# Patient Record
Sex: Male | Born: 1970 | Race: White | Hispanic: No | Marital: Single | Smoking: Current every day smoker
Health system: Southern US, Community
[De-identification: ages and names within clinical notes are randomized; demographics above are authoritative.]

## PROBLEM LIST (undated history)

## (undated) DIAGNOSIS — F1010 Alcohol abuse, uncomplicated: Secondary | ICD-10-CM

## (undated) DIAGNOSIS — R569 Unspecified convulsions: Secondary | ICD-10-CM

## (undated) DIAGNOSIS — F329 Major depressive disorder, single episode, unspecified: Secondary | ICD-10-CM

## (undated) DIAGNOSIS — F32A Depression, unspecified: Secondary | ICD-10-CM

---

## 2010-09-16 ENCOUNTER — Emergency Department (HOSPITAL_COMMUNITY)
Admission: EM | Admit: 2010-09-16 | Discharge: 2010-09-17 | Disposition: A | Discharge status: Home / Self Care | Payer: Self-pay | Attending: Emergency Medicine | Admitting: Emergency Medicine

## 2010-09-16 DIAGNOSIS — F1010 Alcohol abuse, uncomplicated: Secondary | ICD-10-CM | POA: Insufficient documentation

## 2010-09-16 DIAGNOSIS — F3289 Other specified depressive episodes: Secondary | ICD-10-CM | POA: Insufficient documentation

## 2010-09-16 DIAGNOSIS — F329 Major depressive disorder, single episode, unspecified: Secondary | ICD-10-CM | POA: Insufficient documentation

## 2010-09-16 LAB — CBC
HCT: 45.1 % (ref 39.0–52.0)
Hemoglobin: 15.5 g/dL (ref 13.0–17.0)
MCH: 31.3 pg (ref 26.0–34.0)
MCV: 91.1 fL (ref 78.0–100.0)
Platelets: 336 10*3/uL (ref 150–400)
RBC: 4.95 MIL/uL (ref 4.22–5.81)
WBC: 11.4 10*3/uL — ABNORMAL HIGH (ref 4.0–10.5)

## 2010-09-16 LAB — COMPREHENSIVE METABOLIC PANEL
Albumin: 4.4 g/dL (ref 3.5–5.2)
Alkaline Phosphatase: 81 U/L (ref 39–117)
BUN: 8 mg/dL (ref 6–23)
CO2: 29 mEq/L (ref 19–32)
Chloride: 105 mEq/L (ref 96–112)
GFR calc non Af Amer: >60 mL/min (ref >60)
Potassium: 3.3 mEq/L — ABNORMAL LOW (ref 3.5–5.1)
Total Bilirubin: 0.2 mg/dL — ABNORMAL LOW (ref 0.3–1.2)

## 2010-09-16 LAB — DIFFERENTIAL
Eosinophils Absolute: 0.4 10*3/uL (ref 0.0–0.7)
Lymphocytes Relative: 21 % (ref 12–46)
Lymphs Abs: 2.4 10*3/uL (ref 0.7–4.0)
Monocytes Relative: 3 % (ref 3–12)
Neutro Abs: 8.2 10*3/uL — ABNORMAL HIGH (ref 1.7–7.7)
Neutrophils Relative %: 72 % (ref 43–77)

## 2010-09-16 LAB — RAPID URINE DRUG SCREEN, HOSP PERFORMED
Amphetamines: NOT DETECTED
Barbiturates: NOT DETECTED
Tetrahydrocannabinol: POSITIVE — AB

## 2010-12-15 ENCOUNTER — Emergency Department (HOSPITAL_COMMUNITY)
Admission: EM | Admit: 2010-12-15 | Discharge: 2010-12-15 | Discharge status: Against Medical Advice | Payer: Self-pay | Attending: Emergency Medicine | Admitting: Emergency Medicine

## 2010-12-15 DIAGNOSIS — S01501A Unspecified open wound of lip, initial encounter: Secondary | ICD-10-CM | POA: Insufficient documentation

## 2011-03-13 ENCOUNTER — Emergency Department (HOSPITAL_COMMUNITY)
Admission: EM | Admit: 2011-03-13 | Discharge: 2011-03-13 | Disposition: A | Discharge status: Home / Self Care | Payer: Self-pay | Attending: Emergency Medicine | Admitting: Emergency Medicine

## 2011-03-13 ENCOUNTER — Encounter: Payer: Self-pay | Admitting: Emergency Medicine

## 2011-03-13 DIAGNOSIS — F10929 Alcohol use, unspecified with intoxication, unspecified: Secondary | ICD-10-CM

## 2011-03-13 DIAGNOSIS — W1809XA Striking against other object with subsequent fall, initial encounter: Secondary | ICD-10-CM | POA: Insufficient documentation

## 2011-03-13 DIAGNOSIS — R269 Unspecified abnormalities of gait and mobility: Secondary | ICD-10-CM | POA: Insufficient documentation

## 2011-03-13 DIAGNOSIS — S0100XA Unspecified open wound of scalp, initial encounter: Secondary | ICD-10-CM | POA: Insufficient documentation

## 2011-03-13 DIAGNOSIS — R4189 Other symptoms and signs involving cognitive functions and awareness: Secondary | ICD-10-CM | POA: Insufficient documentation

## 2011-03-13 DIAGNOSIS — F1010 Alcohol abuse, uncomplicated: Secondary | ICD-10-CM | POA: Insufficient documentation

## 2011-03-13 DIAGNOSIS — S0191XA Laceration without foreign body of unspecified part of head, initial encounter: Secondary | ICD-10-CM

## 2011-03-13 HISTORY — DX: Alcohol abuse, uncomplicated: F10.10

## 2011-03-13 MED ORDER — TETANUS-DIPHTH-ACELL PERTUSSIS 5-2.5-18.5 LF-MCG/0.5 IM SUSP
0.5000 mL | Freq: Once | INTRAMUSCULAR | Status: AC
  Administered 2011-03-13: 0.5 mL via INTRAMUSCULAR
  Filled 2011-03-13 (×2): qty 0.5

## 2011-03-13 NOTE — ED Notes (Signed)
RUE:AVWU<JW> Expected date:03/13/11<BR> Expected time: 7:05 PM<BR> Means of arrival:Ambulance<BR> Comments:<BR> EMS 30 GC - etoh/fall
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
## 2011-03-13 NOTE — ED Notes (Signed)
MD at bedside. 

## 2011-10-22 ENCOUNTER — Emergency Department (HOSPITAL_COMMUNITY): Payer: Self-pay

## 2011-10-22 ENCOUNTER — Emergency Department (HOSPITAL_COMMUNITY)
Admission: EM | Admit: 2011-10-22 | Discharge: 2011-10-23 | Disposition: A | Discharge status: Home / Self Care | Payer: Self-pay | Attending: Emergency Medicine | Admitting: Emergency Medicine

## 2011-10-22 ENCOUNTER — Encounter (HOSPITAL_COMMUNITY): Payer: Self-pay | Admitting: Emergency Medicine

## 2011-10-22 DIAGNOSIS — F10929 Alcohol use, unspecified with intoxication, unspecified: Secondary | ICD-10-CM

## 2011-10-22 DIAGNOSIS — S0990XA Unspecified injury of head, initial encounter: Secondary | ICD-10-CM | POA: Insufficient documentation

## 2011-10-22 DIAGNOSIS — S0100XA Unspecified open wound of scalp, initial encounter: Secondary | ICD-10-CM | POA: Insufficient documentation

## 2011-10-22 DIAGNOSIS — IMO0002 Reserved for concepts with insufficient information to code with codable children: Secondary | ICD-10-CM | POA: Insufficient documentation

## 2011-10-22 DIAGNOSIS — F1010 Alcohol abuse, uncomplicated: Secondary | ICD-10-CM | POA: Insufficient documentation

## 2011-10-22 DIAGNOSIS — X58XXXA Exposure to other specified factors, initial encounter: Secondary | ICD-10-CM | POA: Insufficient documentation

## 2011-10-22 DIAGNOSIS — S0101XA Laceration without foreign body of scalp, initial encounter: Secondary | ICD-10-CM

## 2011-10-22 LAB — COMPREHENSIVE METABOLIC PANEL
ALT: 21 U/L (ref 0–53)
Alkaline Phosphatase: 60 U/L (ref 39–117)
CO2: 24 mEq/L (ref 19–32)
GFR calc Af Amer: >90 mL/min (ref >90)
GFR calc non Af Amer: >90 mL/min (ref >90)
Glucose, Bld: 144 mg/dL — ABNORMAL HIGH (ref 70–99)
Potassium: 3.2 mEq/L — ABNORMAL LOW (ref 3.5–5.1)
Sodium: 141 mEq/L (ref 135–145)
Total Bilirubin: 0.2 mg/dL — ABNORMAL LOW (ref 0.3–1.2)

## 2011-10-22 LAB — CBC
Hemoglobin: 14.3 g/dL (ref 13.0–17.0)
MCV: 91.9 fL (ref 78.0–100.0)
Platelets: 290 10*3/uL (ref 150–400)
RBC: 4.46 MIL/uL (ref 4.22–5.81)
WBC: 8.4 10*3/uL (ref 4.0–10.5)

## 2011-10-22 LAB — DIFFERENTIAL
Lymphocytes Relative: 29 % (ref 12–46)
Lymphs Abs: 2.5 10*3/uL (ref 0.7–4.0)
Neutrophils Relative %: 60 % (ref 43–77)

## 2011-10-22 LAB — RAPID URINE DRUG SCREEN, HOSP PERFORMED
Barbiturates: NOT DETECTED
Cocaine: NOT DETECTED
Tetrahydrocannabinol: NOT DETECTED

## 2011-10-22 LAB — URINALYSIS, ROUTINE W REFLEX MICROSCOPIC
Bilirubin Urine: NEGATIVE
Ketones, ur: NEGATIVE mg/dL
Nitrite: NEGATIVE
Protein, ur: NEGATIVE mg/dL
Urobilinogen, UA: 0.2 mg/dL (ref 0.0–1.0)
pH: 5 (ref 5.0–8.0)

## 2011-10-22 MED ORDER — ZIPRASIDONE MESYLATE 20 MG IM SOLR
INTRAMUSCULAR | Status: AC
  Administered 2011-10-22: 20 mg via INTRAMUSCULAR
  Filled 2011-10-22: qty 20

## 2011-10-22 MED ORDER — ZIPRASIDONE MESYLATE 20 MG IM SOLR
20.0000 mg | Freq: Once | INTRAMUSCULAR | Status: AC
  Administered 2011-10-22: 20 mg via INTRAMUSCULAR
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
## 2011-10-22 NOTE — ED Notes (Signed)
[id number]<AV> Expected date:10/22/11<BR> Expected time: 3:10 PM<BR> Means of arrival:<BR> Comments:<BR> etoh

## 2011-10-22 NOTE — Discharge Instructions (Signed)
Finding Treatment for Alcohol and Drug Addiction   It can be hard to find the right place to get professional treatment. Here are some important things to consider:   There are different types of treatment to choose from.   Some programs are live-in (residential) while others are not (outpatient). Sometimes a combination is offered.   No single type of program is right for everyone.   Most treatment programs involve a combination of education, counseling, and a 12-step, spiritually-based approach.   There are non-spiritually based programs (not 12-step).   Some treatment programs are government sponsored. They are geared for patients without private insurance.   Treatment programs can vary in many respects such as:   Cost and types of insurance accepted.   Types of on-site medical services offered.   Length of stay, setting, and size.   Overall philosophy of treatment.   A person may need specialized treatment or have needs not addressed by all programs. For example, adolescents need treatment appropriate for their age. Other people have secondary disorders that must be managed as well. Secondary conditions can include mental illness, such as depression or diabetes. Often, a period of detoxification from alcohol or drugs is needed. This requires medical supervision and not all programs offer this.   THINGS TO CONSIDER WHEN SELECTING A TREATMENT PROGRAM   Is the program certified by the appropriate government agency? Even private programs must be certified and employ certified professionals.   Does the program accept your insurance? If not, can a payment plan be set up?   Is the facility clean, organized, and well run? Do they allow you to speak with graduates who can share their treatment experience with you? Can you tour the facility? Can you meet with staff?   Does the program meet the full range of individual needs?   Does the treatment program address sexual orientation and physical disabilities? Do they provide  age, gender, and culturally appropriate treatment services?   Is treatment available in languages other than English?   Is long-term aftercare support or guidance encouraged and provided?   Is assessment of an individual's treatment plan ongoing to ensure it meets changing needs?   Does the program use strategies to encourage reluctant patients to remain in treatment long enough to increase the likelihood of success?   Does the program offer counseling (individual or group) and other behavioral therapies?   Does the program offer medicine as part of the treatment regimen, if needed?   Is there ongoing monitoring of possible relapse? Is there a defined relapse prevention program? Are services or referrals offered to family members to ensure they understand addiction and the recovery process? This would help them support the recovering individual.   Are 12-step meetings held at the center or is transport available for patients to attend outside meetings?  In countries outside of the [place], see local directories for contact information for services in your area.   Document Released: 03/20/2005 Document Revised: 04/10/2011 Document Reviewed: 09/30/2007   [employer]® Patient Information ©2012 [employer].

## 2011-10-23 NOTE — ED Notes (Signed)
Pt left prior to receiving d/c paper work - unable to obtain d/c VS or signature.

## 2011-10-23 NOTE — ED Notes (Signed)
Pt awake, alert and active - requesting to be d/c'd - Dr. Lynelle Doctor notified.

## 2011-10-23 NOTE — ED Notes (Signed)
Pt asked to leave hospital and stated that he feels.  Pt stated that he would leave without consent if he no one released him.  Nurse was informed.

## 2012-01-16 ENCOUNTER — Encounter (HOSPITAL_COMMUNITY): Payer: Self-pay | Admitting: Nurse Practitioner

## 2012-01-16 ENCOUNTER — Emergency Department (HOSPITAL_COMMUNITY)
Admission: EM | Admit: 2012-01-16 | Discharge: 2012-01-16 | Discharge status: Against Medical Advice | Payer: Self-pay | Attending: Emergency Medicine | Admitting: Emergency Medicine

## 2012-01-16 DIAGNOSIS — S51812A Laceration without foreign body of left forearm, initial encounter: Secondary | ICD-10-CM

## 2012-01-16 DIAGNOSIS — S51809A Unspecified open wound of unspecified forearm, initial encounter: Secondary | ICD-10-CM | POA: Insufficient documentation

## 2012-01-16 DIAGNOSIS — F1010 Alcohol abuse, uncomplicated: Secondary | ICD-10-CM | POA: Insufficient documentation

## 2012-01-16 MED ORDER — TETANUS-DIPHTH-ACELL PERTUSSIS 5-2.5-18.5 LF-MCG/0.5 IM SUSP: 0.5000 mL | Freq: Once | INTRAMUSCULAR | Status: DC

## 2012-01-16 NOTE — ED Notes (Signed)
Pt came to nurses station and states he does not want to stay unless he can smoke a cigarette. Discussed plan of care with pt and encouraged pt to stay for treatment, educated pt about risk of infection at site of wound. Pt states he is not staying and wants to sign out AMA.
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
## 2012-01-17 NOTE — ED Provider Notes (Signed)
Medical screening examination/treatment/procedure(s) were performed by non-physician practitioner and as supervising physician I was immediately available for consultation/collaboration.  Annaleise Burger, MD 01/17/12 0042 

## 2012-08-21 ENCOUNTER — Encounter (HOSPITAL_COMMUNITY): Payer: Self-pay | Admitting: Physical Medicine and Rehabilitation

## 2012-08-21 ENCOUNTER — Emergency Department (HOSPITAL_COMMUNITY)
Admission: EM | Admit: 2012-08-21 | Discharge: 2012-08-21 | Discharge status: Against Medical Advice | Payer: Self-pay | Attending: Emergency Medicine | Admitting: Emergency Medicine

## 2012-08-21 DIAGNOSIS — K089 Disorder of teeth and supporting structures, unspecified: Secondary | ICD-10-CM | POA: Insufficient documentation

## 2012-08-21 NOTE — ED Notes (Signed)
Called for pt no answer x3 

## 2012-08-21 NOTE — ED Notes (Signed)
Unable to locate pt x2

## 2012-08-21 NOTE — ED Notes (Signed)
Pt called x1. Unable to locate. 

## 2012-08-21 NOTE — ED Notes (Signed)
Called for pt with no answer 

## 2012-08-21 NOTE — ED Notes (Signed)
Pt presents to department for evaluation of L upper molar pain. Ongoing for several days. 8/10 pain at the time. No signs of acute distress noted.

## 2012-11-30 ENCOUNTER — Encounter (HOSPITAL_COMMUNITY): Payer: Self-pay | Admitting: Emergency Medicine

## 2012-11-30 ENCOUNTER — Emergency Department (HOSPITAL_COMMUNITY): Payer: Self-pay

## 2012-11-30 ENCOUNTER — Emergency Department (HOSPITAL_COMMUNITY)
Admission: EM | Admit: 2012-11-30 | Discharge: 2012-11-30 | Disposition: A | Discharge status: Home / Self Care | Payer: Self-pay | Attending: Emergency Medicine | Admitting: Emergency Medicine

## 2012-11-30 DIAGNOSIS — S62501A Fracture of unspecified phalanx of right thumb, initial encounter for closed fracture: Secondary | ICD-10-CM

## 2012-11-30 DIAGNOSIS — S62609A Fracture of unspecified phalanx of unspecified finger, initial encounter for closed fracture: Secondary | ICD-10-CM | POA: Insufficient documentation

## 2012-11-30 DIAGNOSIS — Y929 Unspecified place or not applicable: Secondary | ICD-10-CM | POA: Insufficient documentation

## 2012-11-30 DIAGNOSIS — Y9389 Activity, other specified: Secondary | ICD-10-CM | POA: Insufficient documentation

## 2012-11-30 DIAGNOSIS — M25549 Pain in joints of unspecified hand: Secondary | ICD-10-CM | POA: Insufficient documentation

## 2012-11-30 DIAGNOSIS — R296 Repeated falls: Secondary | ICD-10-CM | POA: Insufficient documentation

## 2012-11-30 DIAGNOSIS — F17200 Nicotine dependence, unspecified, uncomplicated: Secondary | ICD-10-CM | POA: Insufficient documentation

## 2012-11-30 DIAGNOSIS — F1010 Alcohol abuse, uncomplicated: Secondary | ICD-10-CM | POA: Insufficient documentation

## 2012-11-30 MED ORDER — HYDROCODONE-ACETAMINOPHEN 5-325 MG PO TABS: 1.0000 | ORAL_TABLET | Freq: Three times a day (TID) | ORAL | Status: DC | PRN

## 2012-11-30 MED ORDER — IBUPROFEN 400 MG PO TABS
800.0000 mg | ORAL_TABLET | Freq: Once | ORAL | Status: AC
  Administered 2012-11-30: 800 mg via ORAL
  Filled 2012-11-30: qty 2

## 2012-11-30 NOTE — ED Notes (Signed)
Pt c/o right thumb pain x 1 month since falling
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
## 2012-12-01 NOTE — ED Provider Notes (Signed)
Medical screening examination/treatment/procedure(s) were performed by non-physician practitioner and as supervising physician I was immediately available for consultation/collaboration.  Doug Sou, MD 12/01/12 1023

## 2012-12-10 ENCOUNTER — Emergency Department (HOSPITAL_COMMUNITY)
Admission: EM | Admit: 2012-12-10 | Discharge: 2012-12-10 | Disposition: A | Discharge status: Home / Self Care | Payer: Self-pay | Attending: Emergency Medicine | Admitting: Emergency Medicine

## 2012-12-10 ENCOUNTER — Encounter (HOSPITAL_COMMUNITY): Payer: Self-pay | Admitting: Emergency Medicine

## 2012-12-10 ENCOUNTER — Emergency Department (HOSPITAL_COMMUNITY): Payer: Self-pay

## 2012-12-10 DIAGNOSIS — S0003XA Contusion of scalp, initial encounter: Secondary | ICD-10-CM | POA: Insufficient documentation

## 2012-12-10 DIAGNOSIS — R404 Transient alteration of awareness: Secondary | ICD-10-CM | POA: Insufficient documentation

## 2012-12-10 DIAGNOSIS — S0083XA Contusion of other part of head, initial encounter: Secondary | ICD-10-CM

## 2012-12-10 DIAGNOSIS — F17200 Nicotine dependence, unspecified, uncomplicated: Secondary | ICD-10-CM | POA: Insufficient documentation

## 2012-12-10 DIAGNOSIS — S1093XA Contusion of unspecified part of neck, initial encounter: Secondary | ICD-10-CM | POA: Insufficient documentation

## 2012-12-10 MED ORDER — HYDROCODONE-ACETAMINOPHEN 5-325 MG PO TABS: 1.0000 | ORAL_TABLET | Freq: Four times a day (QID) | ORAL | Status: DC | PRN
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
## 2012-12-12 NOTE — ED Provider Notes (Signed)
Medical screening examination/treatment/procedure(s) were conducted as a shared visit with non-physician practitioner(s) or resident  and myself.  I personally evaluated the patient during the encounter and agree with the findings and plan unless otherwise indicated.  Facial contusion.  Mild tenderness with no step off. CNs intact.  CT scan reviewed.  Close fup discussed.    Enid Skeens, MD 12/12/12 [phone]

## 2013-01-07 ENCOUNTER — Encounter (HOSPITAL_COMMUNITY): Payer: Self-pay | Admitting: Emergency Medicine

## 2013-01-07 ENCOUNTER — Emergency Department (HOSPITAL_COMMUNITY)
Admission: EM | Admit: 2013-01-07 | Discharge: 2013-01-07 | Disposition: A | Discharge status: Home / Self Care | Payer: Self-pay | Attending: Emergency Medicine | Admitting: Emergency Medicine

## 2013-01-07 DIAGNOSIS — F17200 Nicotine dependence, unspecified, uncomplicated: Secondary | ICD-10-CM | POA: Insufficient documentation

## 2013-01-07 DIAGNOSIS — L0201 Cutaneous abscess of face: Secondary | ICD-10-CM | POA: Insufficient documentation

## 2013-01-07 DIAGNOSIS — L03211 Cellulitis of face: Secondary | ICD-10-CM | POA: Insufficient documentation

## 2013-01-07 DIAGNOSIS — F1021 Alcohol dependence, in remission: Secondary | ICD-10-CM | POA: Insufficient documentation

## 2013-01-07 DIAGNOSIS — L255 Unspecified contact dermatitis due to plants, except food: Secondary | ICD-10-CM | POA: Insufficient documentation

## 2013-01-07 DIAGNOSIS — L237 Allergic contact dermatitis due to plants, except food: Secondary | ICD-10-CM

## 2013-01-07 DIAGNOSIS — Z8659 Personal history of other mental and behavioral disorders: Secondary | ICD-10-CM | POA: Insufficient documentation

## 2013-01-07 HISTORY — DX: Depression, unspecified: F32.A

## 2013-01-07 HISTORY — DX: Major depressive disorder, single episode, unspecified: F32.9

## 2013-01-07 MED ORDER — CEPHALEXIN 500 MG PO CAPS: 500.0000 mg | ORAL_CAPSULE | Freq: Four times a day (QID) | ORAL | Status: DC

## 2013-01-07 MED ORDER — PREDNISONE 20 MG PO TABS: 60.0000 mg | ORAL_TABLET | Freq: Every day | ORAL | Status: AC

## 2013-01-07 NOTE — ED Notes (Signed)
Pt here for "poison ivy" all over his body. Right arm swollen. ALSO, has "cyst" left face, tender, dimpling to left face.

## 2013-01-07 NOTE — ED Provider Notes (Signed)
CSN: [id number]     Arrival date & time 01/07/13  4098 History   First MD Initiated Contact with Patient 01/07/13 0940     Chief Complaint  Patient presents with  . Rash   (Consider location/radiation/quality/duration/timing/severity/associated sxs/prior Treatment) Patient is a 42 y.o. male presenting with rash and abscess. The history is provided by the patient. No language interpreter was used.  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 Abscess Location:  Face Associated symptoms: no fever   Associated symptoms comment:  Painful swollen nodule to left cheek since previous treatment for dental infection.   Past Medical History  Diagnosis Date  . ETOH abuse   . Depression    History reviewed. No pertinent past surgical history. No family history on file. History  Substance Use Topics  . Smoking status: Current Every Day Smoker    Types: Cigarettes  . Smokeless tobacco: Not on file  . Alcohol Use: Yes     Comment: social    Review of Systems  Constitutional: Negative for fever.  HENT: Negative for dental problem.   Skin: Positive for rash.       See HPI.    Allergies  Review of patient's allergies indicates no known allergies.  Home Medications  No current outpatient prescriptions on file. BP 136/87  Pulse 102  Temp(Src) 98.4 F (36.9 C)  Resp 16  SpO2 97% Physical Exam  Constitutional: He appears well-developed and well-nourished. No distress.  Lymphadenopathy:    He has no cervical adenopathy.  Skin:  Weeping, maculopapular rash to arms, neck, face (including right periorbital area) c/w contact dermatitis. There is a small, non-fluctuant nodule that is minimally tender to left cheek.    ED Course  Procedures (including critical care time) Labs Review Labs Reviewed - No data to  display Imaging Review No results found.  MDM  No diagnosis found. 1. Poison Ivy 2. Facial abscess  Tender facial nodule without fluctuance - DDx: early abscess vs nonspecific, non-infectious nodule. Will cover with abx. Prednisone for contact dermatitis.    Arnoldo Hooker, PA-C 01/10/13 [phone]
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
## 2013-01-10 NOTE — ED Provider Notes (Signed)
Medical screening examination/treatment/procedure(s) were performed by non-physician practitioner and as supervising physician I was immediately available for consultation/collaboration.   Dagmar Hait, MD 01/10/13 [phone]

## 2013-01-14 ENCOUNTER — Encounter (HOSPITAL_COMMUNITY): Payer: Self-pay

## 2013-01-14 ENCOUNTER — Emergency Department (HOSPITAL_COMMUNITY)
Admission: EM | Admit: 2013-01-14 | Discharge: 2013-01-14 | Disposition: A | Discharge status: Home / Self Care | Payer: Self-pay | Attending: Emergency Medicine | Admitting: Emergency Medicine

## 2013-01-14 ENCOUNTER — Emergency Department (HOSPITAL_COMMUNITY): Payer: Self-pay

## 2013-01-14 DIAGNOSIS — S0990XA Unspecified injury of head, initial encounter: Secondary | ICD-10-CM | POA: Insufficient documentation

## 2013-01-14 DIAGNOSIS — S0100XA Unspecified open wound of scalp, initial encounter: Secondary | ICD-10-CM | POA: Insufficient documentation

## 2013-01-14 DIAGNOSIS — F17200 Nicotine dependence, unspecified, uncomplicated: Secondary | ICD-10-CM | POA: Insufficient documentation

## 2013-01-14 DIAGNOSIS — Y939 Activity, unspecified: Secondary | ICD-10-CM | POA: Insufficient documentation

## 2013-01-14 DIAGNOSIS — T148XXA Other injury of unspecified body region, initial encounter: Secondary | ICD-10-CM

## 2013-01-14 DIAGNOSIS — F1010 Alcohol abuse, uncomplicated: Secondary | ICD-10-CM | POA: Insufficient documentation

## 2013-01-14 DIAGNOSIS — Y9289 Other specified places as the place of occurrence of the external cause: Secondary | ICD-10-CM | POA: Insufficient documentation

## 2013-01-14 DIAGNOSIS — T798XXS Other early complications of trauma, sequela: Secondary | ICD-10-CM

## 2013-01-14 DIAGNOSIS — X58XXXA Exposure to other specified factors, initial encounter: Secondary | ICD-10-CM | POA: Insufficient documentation

## 2013-01-14 DIAGNOSIS — Z23 Encounter for immunization: Secondary | ICD-10-CM | POA: Insufficient documentation

## 2013-01-14 DIAGNOSIS — Z8659 Personal history of other mental and behavioral disorders: Secondary | ICD-10-CM | POA: Insufficient documentation

## 2013-01-14 MED ORDER — TETANUS-DIPHTH-ACELL PERTUSSIS 5-2.5-18.5 LF-MCG/0.5 IM SUSP
0.5000 mL | Freq: Once | INTRAMUSCULAR | Status: AC
  Administered 2013-01-14: 0.5 mL via INTRAMUSCULAR
  Filled 2013-01-14: qty 0.5

## 2013-01-14 MED ORDER — CEPHALEXIN 500 MG PO CAPS: 500.0000 mg | ORAL_CAPSULE | Freq: Four times a day (QID) | ORAL | Status: DC
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
## 2013-01-14 NOTE — ED Notes (Signed)
Patient transported to CT 

## 2013-01-14 NOTE — ED Notes (Signed)
Patient has taken off c-collar and ambulatory out of bed. MOEx4 with steady gait. Requesting to use the restroom. Provided patient a urinal.

## 2013-01-14 NOTE — ED Notes (Signed)
[employer] remains at bedside. 

## 2013-05-28 DIAGNOSIS — F17200 Nicotine dependence, unspecified, uncomplicated: Secondary | ICD-10-CM | POA: Insufficient documentation

## 2013-05-28 DIAGNOSIS — F10229 Alcohol dependence with intoxication, unspecified: Secondary | ICD-10-CM | POA: Insufficient documentation

## 2013-05-28 DIAGNOSIS — Z8659 Personal history of other mental and behavioral disorders: Secondary | ICD-10-CM | POA: Insufficient documentation

## 2013-05-29 ENCOUNTER — Emergency Department (HOSPITAL_COMMUNITY)
Admission: EM | Admit: 2013-05-29 | Discharge: 2013-05-29 | Disposition: A | Discharge status: Home / Self Care | Payer: Self-pay | Attending: Emergency Medicine | Admitting: Emergency Medicine

## 2013-05-29 ENCOUNTER — Encounter (HOSPITAL_COMMUNITY): Payer: Self-pay | Admitting: Emergency Medicine

## 2013-05-29 DIAGNOSIS — F10929 Alcohol use, unspecified with intoxication, unspecified: Secondary | ICD-10-CM

## 2013-05-29 LAB — CBC WITH DIFFERENTIAL/PLATELET
BASOS PCT: 1 % (ref 0–1)
Basophils Absolute: 0.1 10*3/uL (ref 0.0–0.1)
Eosinophils Absolute: 0.4 10*3/uL (ref 0.0–0.7)
Eosinophils Relative: 4 % (ref 0–5)
HEMATOCRIT: 40.7 % (ref 39.0–52.0)
HEMOGLOBIN: 14.2 g/dL (ref 13.0–17.0)
LYMPHS ABS: 2.4 10*3/uL (ref 0.7–4.0)
Lymphocytes Relative: 24 % (ref 12–46)
MCH: 34.1 pg — AB (ref 26.0–34.0)
MCHC: 34.9 g/dL (ref 30.0–36.0)
MCV: 97.6 fL (ref 78.0–100.0)
MONO ABS: 0.4 10*3/uL (ref 0.1–1.0)
MONOS PCT: 4 % (ref 3–12)
NEUTROS ABS: 6.5 10*3/uL (ref 1.7–7.7)
NEUTROS PCT: 66 % (ref 43–77)
Platelets: 336 10*3/uL (ref 150–400)
RBC: 4.17 MIL/uL — AB (ref 4.22–5.81)
RDW: 11.7 % (ref 11.5–15.5)
WBC: 9.8 10*3/uL (ref 4.0–10.5)

## 2013-05-29 LAB — ETHANOL: Alcohol, Ethyl (B): 387 mg/dL — ABNORMAL HIGH (ref 0–11)

## 2013-05-29 LAB — BASIC METABOLIC PANEL
BUN: 16 mg/dL (ref 6–23)
CO2: 23 meq/L (ref 19–32)
Calcium: 9.3 mg/dL (ref 8.4–10.5)
Chloride: 106 mEq/L (ref 96–112)
Creatinine, Ser: 0.9 mg/dL (ref 0.50–1.35)
GFR calc Af Amer: >90 mL/min (ref >90)
GFR calc non Af Amer: >90 mL/min (ref >90)
GLUCOSE: 98 mg/dL (ref 70–99)
POTASSIUM: 4.3 meq/L (ref 3.7–5.3)
SODIUM: 145 meq/L (ref 137–147)

## 2013-05-29 NOTE — ED Notes (Signed)
Pt left after receiving discharge instructions from MD, but prior to receiving written instructions and signing for discharge

## 2013-05-29 NOTE — Discharge Instructions (Signed)
Alcohol Intoxication °Alcohol intoxication occurs when the amount of alcohol that a person has consumed impairs his or her ability to mentally and physically function. Alcohol directly impairs the normal chemical activity of the brain. Drinking large amounts of alcohol can lead to changes in mental function and behavior, and it can cause many physical effects that can be harmful.  °Alcohol intoxication can range in severity from mild to very severe. Various factors can affect the level of intoxication that occurs, such as the person's age, gender, weight, frequency of alcohol consumption, and the presence of other medical conditions (such as diabetes, seizures, or heart conditions). Dangerous levels of alcohol intoxication may occur when people drink large amounts of alcohol in a short period (binge drinking). Alcohol can also be especially dangerous when combined with certain prescription medicines or "recreational" drugs. °SIGNS AND SYMPTOMS °Some common signs and symptoms of mild alcohol intoxication include: °· Loss of coordination. °· Changes in mood and behavior. °· Impaired judgment. °· Slurred speech. °As alcohol intoxication progresses to more severe levels, other signs and symptoms will appear. These may include: °· Vomiting. °· Confusion and impaired memory. °· Slowed breathing. °· Seizures. °· Loss of consciousness. °DIAGNOSIS  °Your health care provider will take a medical history and perform a physical exam. You will be asked about the amount and type of alcohol you have consumed. Blood tests will be done to measure the concentration of alcohol in your blood. In many places, your blood alcohol level must be lower than 80 mg/dL (0.08%) to legally drive. However, many dangerous effects of alcohol can occur at much lower levels.  °TREATMENT  °People with alcohol intoxication often do not require treatment. Most of the effects of alcohol intoxication are temporary, and they go away as the alcohol naturally  leaves the body. Your health care provider will monitor your condition until you are stable enough to go home. Fluids are sometimes given through an IV access tube to help prevent dehydration.  °HOME CARE INSTRUCTIONS °· Do not drive after drinking alcohol. °· Stay hydrated. Drink enough water and fluids to keep your urine clear or pale yellow. Avoid caffeine.   °· Only take over-the-counter or prescription medicines as directed by your health care provider.   °SEEK MEDICAL CARE IF:  °· You have persistent vomiting.   °· You do not feel better after a few days. °· You have frequent alcohol intoxication. Your health care provider can help determine if you should see a substance use treatment counselor. °SEEK IMMEDIATE MEDICAL CARE IF:  °· You become shaky or tremble when you try to stop drinking.   °· You shake uncontrollably (seizure).   °· You throw up (vomit) blood. This may be bright red or may look like black coffee grounds.   °· You have blood in your stool. This may be bright red or may appear as a black, tarry, bad smelling stool.   °· You become lightheaded or faint.   °MAKE SURE YOU:  °· Understand these instructions. °· Will watch your condition. °· Will get help right away if you are not doing well or get worse. °Document Released: 01/29/2005 Document Revised: 12/22/2012 Document Reviewed: 09/24/2012 °[employer]® Patient Information ©2014 [employer]. ° °

## 2013-05-29 NOTE — ED Provider Notes (Signed)
CSN: [id number]     Arrival date & time 05/28/13  2359 History   First MD Initiated Contact with Patient 05/29/13 0044     Chief Complaint  Patient presents with  . Alcohol Intoxication   (Consider location/radiation/quality/duration/timing/severity/associated sxs/prior Treatment) HPI Comments: Patient is a 43 year old male with history of chronic alcoholism. [place] [facility] [place]. He was apparently admitted to the drunk tank, but was too intoxicated and the staff there felt uncomfortable with this presence. He was then brought here. The patient has no complaints with the exception of being hungry.  Patient is a 43 y.o. male presenting with intoxication. The history is provided by the patient.  Alcohol Intoxication This is a recurrent problem. The problem occurs constantly. The problem has not changed since onset.Nothing aggravates the symptoms. Nothing relieves the symptoms.    Past Medical History  Diagnosis Date  . ETOH abuse   . Depression    History reviewed. No pertinent past surgical history. No family history on file. History  Substance Use Topics  . Smoking status: Current Every Day Smoker    Types: Cigarettes  . Smokeless tobacco: Never Used  . Alcohol Use: Yes     Comment: social    Review of Systems  All other systems reviewed and are negative.    Allergies  Review of patient's allergies indicates no known allergies.  Home Medications  No current outpatient prescriptions on file. BP 108/69  Pulse 83  Temp(Src) 97.1 F (36.2 C) (Oral)  Resp 18  SpO2 98% Physical Exam  Nursing note and vitals reviewed. Constitutional: He is oriented to person, place, and time. He appears well-developed and well-nourished. No distress.  The patient is somnolent, but arousable. There is a strong odor of alcohol present.  HENT:  Head: Normocephalic and atraumatic.  Mouth/Throat: Oropharynx is clear and moist.  Eyes: EOM are normal.  Pupils are equal, round, and reactive to light.  Neck: Normal range of motion. Neck supple.  Cardiovascular: Normal rate, regular rhythm and normal heart sounds.   No murmur heard. Pulmonary/Chest: Effort normal and breath sounds normal. No respiratory distress. He has no wheezes.  Abdominal: Soft. Bowel sounds are normal. He exhibits no distension. There is no tenderness.  Musculoskeletal: Normal range of motion. He exhibits no edema.  Lymphadenopathy:    He has no cervical adenopathy.  Neurological: He is oriented to person, place, and time. No cranial nerve deficit. Coordination normal.  Skin: Skin is warm and dry. He is not diaphoretic.    ED Course  Procedures (including critical care time) Labs Review Labs Reviewed  CBC WITH DIFFERENTIAL  ETHANOL  BASIC METABOLIC PANEL  URINE RAPID DRUG SCREEN (HOSP PERFORMED)   Imaging Review No results found.    MDM  No diagnosis found. [facility]. He was apparently incarcerated however felt as though he was too intoxicated to be kept there. Blood alcohol was 387 upon arrival. He was observed here for 6 hours and is now appearing more sober. He is able to ambulate without assistance and I feel as though he is now stable for discharge.    Geoffery Lyonsouglas Daijha Leggio, MD 05/29/13 [id number]

## 2013-05-29 NOTE — ED Notes (Addendum)
Pt brought to ED by [employer]. Pt was sitting on a curb next to a police cruiser. Pt intoxicated & homeless. Pt was taken down town to "drunk tank" and was refused d/t intoxication and subsequently brought here. Pt agreeable. Denies sx or complaints. Reports hunger. Belongings in large clear garbage bag. Alert, interactive, calm, NAD, into ED via w/c.

## 2013-05-29 NOTE — ED Notes (Signed)
Pt resting in bed with eyes closed.

## 2013-05-29 NOTE — ED Notes (Addendum)
Pt resting on bed talking in clear complete sentences.  Pt states that he "drank some beer, smoked some week, drank some more beer and then smoked some more weed."  Pt states his only complain is that he is hungry.

## 2013-08-11 ENCOUNTER — Emergency Department (HOSPITAL_COMMUNITY)
Admission: EM | Admit: 2013-08-11 | Discharge: 2013-08-12 | Disposition: A | Discharge status: Home / Self Care | Payer: Self-pay | Attending: Emergency Medicine | Admitting: Emergency Medicine

## 2013-08-11 ENCOUNTER — Encounter (HOSPITAL_COMMUNITY): Payer: Self-pay | Admitting: Emergency Medicine

## 2013-08-11 ENCOUNTER — Emergency Department (HOSPITAL_COMMUNITY): Payer: Self-pay

## 2013-08-11 DIAGNOSIS — Y929 Unspecified place or not applicable: Secondary | ICD-10-CM | POA: Insufficient documentation

## 2013-08-11 DIAGNOSIS — S99919A Unspecified injury of unspecified ankle, initial encounter: Secondary | ICD-10-CM

## 2013-08-11 DIAGNOSIS — F17200 Nicotine dependence, unspecified, uncomplicated: Secondary | ICD-10-CM | POA: Insufficient documentation

## 2013-08-11 DIAGNOSIS — W1809XA Striking against other object with subsequent fall, initial encounter: Secondary | ICD-10-CM | POA: Insufficient documentation

## 2013-08-11 DIAGNOSIS — S8990XA Unspecified injury of unspecified lower leg, initial encounter: Secondary | ICD-10-CM | POA: Insufficient documentation

## 2013-08-11 DIAGNOSIS — S99929A Unspecified injury of unspecified foot, initial encounter: Secondary | ICD-10-CM

## 2013-08-11 DIAGNOSIS — F1010 Alcohol abuse, uncomplicated: Secondary | ICD-10-CM | POA: Insufficient documentation

## 2013-08-11 DIAGNOSIS — Y939 Activity, unspecified: Secondary | ICD-10-CM | POA: Insufficient documentation

## 2013-08-11 DIAGNOSIS — F10929 Alcohol use, unspecified with intoxication, unspecified: Secondary | ICD-10-CM

## 2013-08-11 NOTE — Progress Notes (Signed)
[facility] provided pt with a list of primary care resources to help him establish primary care.  °

## 2013-08-11 NOTE — ED Notes (Signed)
Patient moved to hallway for better visualization

## 2013-08-11 NOTE — ED Provider Notes (Signed)
CSN: [id number]     Arrival date & time 08/11/13  1511 History   First MD Initiated Contact with Patient 08/11/13 1521     Chief Complaint  Patient presents with  . Facial Pain  . Alcohol Intoxication     (Consider location/radiation/quality/duration/timing/severity/associated sxs/prior Treatment) HPI  43yM brought in by EMS after apparently falling. Appears intoxicated and admits to drinking. Unreliable historian. Opens eyes to voice and somewhat belligerent. Initially replying "I'm going to fuck you up"" when asked what brought him to the ED. Tells me he had $4 and was trying to go to [employer] when he fell? He denies pain. Denies other ingestion.  Past Medical History  Diagnosis Date  . ETOH abuse   . Depression    History reviewed. No pertinent past surgical history. History reviewed. No pertinent family history. History  Substance Use Topics  . Smoking status: Current Every Day Smoker    Types: Cigarettes  . Smokeless tobacco: Never Used  . Alcohol Use: Yes     Comment: social    Review of Systems  Level 5 caveat because pt highly intoxicated.   Allergies  Review of patient's allergies indicates no known allergies.  Home Medications  No current outpatient prescriptions on file. BP 119/72  Pulse 84  Temp(Src) 99.1 F (37.3 C) (Oral)  Resp 16  SpO2 91% Physical Exam  Nursing note and vitals reviewed. Constitutional: He appears well-developed and well-nourished.  Laying in bed. Appears to be asleep.   HENT:  Head: Normocephalic and atraumatic.  Eyes: Conjunctivae are normal. Pupils are equal, round, and reactive to light. Right eye exhibits no discharge. Left eye exhibits no discharge.  Neck: Neck supple.  Cardiovascular: Normal rate, regular rhythm and normal heart sounds.  Exam reveals no gallop and no friction rub.   No murmur heard. Pulmonary/Chest: Effort normal and breath sounds normal. No respiratory distress.  Abdominal: Soft. He exhibits  no distension. There is no tenderness.  Musculoskeletal: He exhibits no edema and no tenderness.     Neurological:  Speech slurred. Moves both legs. Able to raise both arms and extend middle fingers which he does when asked to do other things such squeeze hands, wiggle toes, smile, etc.  No facial drop.   Skin: Skin is warm and dry.  Psychiatric:  intoxicated    ED Course  Procedures (including critical care time) Labs Review Labs Reviewed - No data to display Imaging Review Ct Head Wo Contrast  08/11/2013   CLINICAL DATA:  Fall, hit face.  EXAM: CT HEAD WITHOUT CONTRAST  CT MAXILLOFACIAL WITHOUT CONTRAST  CT CERVICAL SPINE WITHOUT CONTRAST  TECHNIQUE: Multidetector CT imaging of the head, cervical spine, and maxillofacial structures were performed using the standard protocol without intravenous contrast. Multiplanar CT image reconstructions of the cervical spine and maxillofacial structures were also generated.  COMPARISON:  CT HEAD W/O CM dated 01/14/2013; CT ORBITS W/O CM dated 12/10/2012  FINDINGS: CT HEAD FINDINGS  No acute intracranial abnormality. Specifically, no hemorrhage, hydrocephalus, mass lesion, acute infarction, or significant intracranial injury. No acute calvarial abnormality.  CT MAXILLOFACIAL FINDINGS  Mucosal thickening within the maxillary sinuses and ethmoid air cells. No air-fluid levels. No evidence of orbital or facial fracture. Zygomatic arches and mandible intact. Orbital soft tissues are unremarkable.  CT CERVICAL SPINE FINDINGS  Normal alignment. Prevertebral soft tissues are normal. Minimal degenerative spurring throughout the cervical spine. No fracture. No epidural or paraspinal hematoma.  IMPRESSION: No acute intracranial abnormality.  No evidence  of facial or cervical spine fracture.   Electronically Signed   By: Charlett Nose M.D.   On: 08/11/2013 16:06   Ct Cervical Spine Wo Contrast  08/11/2013   CLINICAL DATA:  Fall, hit face.  EXAM: CT HEAD WITHOUT CONTRAST  CT  MAXILLOFACIAL WITHOUT CONTRAST  CT CERVICAL SPINE WITHOUT CONTRAST  TECHNIQUE: Multidetector CT imaging of the head, cervical spine, and maxillofacial structures were performed using the standard protocol without intravenous contrast. Multiplanar CT image reconstructions of the cervical spine and maxillofacial structures were also generated.  COMPARISON:  CT HEAD W/O CM dated 01/14/2013; CT ORBITS W/O CM dated 12/10/2012  FINDINGS: CT HEAD FINDINGS  No acute intracranial abnormality. Specifically, no hemorrhage, hydrocephalus, mass lesion, acute infarction, or significant intracranial injury. No acute calvarial abnormality.  CT MAXILLOFACIAL FINDINGS  Mucosal thickening within the maxillary sinuses and ethmoid air cells. No air-fluid levels. No evidence of orbital or facial fracture. Zygomatic arches and mandible intact. Orbital soft tissues are unremarkable.  CT CERVICAL SPINE FINDINGS  Normal alignment. Prevertebral soft tissues are normal. Minimal degenerative spurring throughout the cervical spine. No fracture. No epidural or paraspinal hematoma.  IMPRESSION: No acute intracranial abnormality.  No evidence of facial or cervical spine fracture.   Electronically Signed   By: Charlett Nose M.D.   On: 08/11/2013 16:06   Ct Maxillofacial Wo Cm  08/11/2013   CLINICAL DATA:  Fall, hit face.  EXAM: CT HEAD WITHOUT CONTRAST  CT MAXILLOFACIAL WITHOUT CONTRAST  CT CERVICAL SPINE WITHOUT CONTRAST  TECHNIQUE: Multidetector CT imaging of the head, cervical spine, and maxillofacial structures were performed using the standard protocol without intravenous contrast. Multiplanar CT image reconstructions of the cervical spine and maxillofacial structures were also generated.  COMPARISON:  CT HEAD W/O CM dated 01/14/2013; CT ORBITS W/O CM dated 12/10/2012  FINDINGS: CT HEAD FINDINGS  No acute intracranial abnormality. Specifically, no hemorrhage, hydrocephalus, mass lesion, acute infarction, or significant intracranial injury. No acute  calvarial abnormality.  CT MAXILLOFACIAL FINDINGS  Mucosal thickening within the maxillary sinuses and ethmoid air cells. No air-fluid levels. No evidence of orbital or facial fracture. Zygomatic arches and mandible intact. Orbital soft tissues are unremarkable.  CT CERVICAL SPINE FINDINGS  Normal alignment. Prevertebral soft tissues are normal. Minimal degenerative spurring throughout the cervical spine. No fracture. No epidural or paraspinal hematoma.  IMPRESSION: No acute intracranial abnormality.  No evidence of facial or cervical spine fracture.   Electronically Signed   By: Charlett Nose M.D.   On: 08/11/2013 16:06     EKG Interpretation None      MDM   Final diagnoses:  Alcohol intoxication  Alcohol abuse    Pt presenting after fall. Highly intoxicated. Arouses to loud verbal and physical stimuli and moves all extremities. Imaging neg for serious acute traumatic injury. Clinically very intoxicated. Admits to drinking. Will continue to observe and reassess until clincially more sober. Additional w/u, if any, pending him becoming more alert/coherent as expected.   8:45 PM Pt awake. Talking. Appropriate. Follows commands. Has medical decision making capability. Still not sure of circumstances brining him to ED. Denies HA, neck or back pain. Imaging negative. Does endorse some pain in his R thigh. Exam unremarkable. Very low suspicion for fx, DVT or infectious process. Medically cleared. Resources provided.   Raeford Razor, MD 08/11/13 2047

## 2013-08-11 NOTE — Discharge Instructions (Signed)
How Much is Too Much Alcohol? Drinking too much alcohol can cause injury, accidents, and health problems. These types of problems can include:   Car crashes.  Falls.  Family fighting (domestic violence).  Drowning.  Fights.  Injuries.  Burns.  Damage to certain organs.  Having a baby with birth defects. ONE DRINK CAN BE TOO MUCH WHEN YOU ARE:  Working.  Pregnant or breastfeeding.  Taking medicines. Ask your doctor.  Driving or planning to drive. WHAT IS A STANDARD DRINK?   1 regular beer (12 ounces or 360 milliliters).  1 glass of wine (5 ounces or 150 milliliters).  1 shot of liquor (1.5 ounces or 45 milliliters). BLOOD ALCOHOL LEVELS   .00 A person is sober.  [place] Kitchen.03 A person has no trouble keeping balance, talking, or seeing right, but a "buzz" may be felt.  [place] Kitchen.05 A person feels "buzzed" and relaxed.  [place] Kitchen.08 or .10  A person is drunk. He or she has trouble talking, seeing right, and keeping his or her balance.  .15 A person loses body control and may pass out (blackout).  .20 A person has trouble walking (staggering) and throws up (vomits).  .30 A person will pass out (unconscious).  .40+ A person will be in a coma. Death is possible. If you or someone you know has a drinking problem, get help from a doctor.  Document Released: 02/15/2009 Document Revised: 07/14/2011 Document Reviewed: 02/15/2009 [facility] Patient Information 2014 [employer] [place].    Emergency Department Resource Guide 1) Find a Doctor and Pay Out of Pocket Although you won't have to find out who is covered by your insurance plan, it is a good idea to ask around and get recommendations. You will then need to call the office and see if the doctor you have chosen will accept you as a new patient and what types of options they offer for patients who are self-pay. Some doctors offer discounts or will set up payment plans for their patients who do not have insurance, but you will need to ask so you  aren't surprised when you get to your appointment.  2) Contact Your Local Health Department Not all health departments have doctors that can see patients for sick visits, but many do, so it is worth a call to see if yours does. If you don't know where your local health department is, you can check in your phone book. The CDC also has a tool to help you locate your state's health department, and many state websites also have listings of all of their local health departments.  3) Find a Walk-in Clinic If your illness is not likely to be very severe or complicated, you may want to try a walk in clinic. These are popping up all over the country in pharmacies, drugstores, and shopping centers. They're usually staffed by nurse practitioners or physician assistants that have been trained to treat common illnesses and complaints. They're usually fairly quick and inexpensive. However, if you have serious medical issues or chronic medical problems, these are probably not your best option.  No Primary Care Doctor: - Call Health Connect at  [phone] - they can help you locate a primary care doctor that  accepts your insurance, provides certain services, etc. - Physician Referral Service- [phone]  Chronic Pain Problems: Organization         Address  Phone   Notes  [facility]  [phone]) [phone] Patients need to be referred by their primary care doctor.  Medication Assistance: °Organization         Address  Phone   Notes  °[place] [facility] [address] °[place], [place] [address] (336) 641-8030 --Must be a resident of [place] °-- Must have NO insurance coverage whatsoever (no Medicaid/ Medicare, etc.) °-- The pt. MUST have a primary care doctor that directs their care regularly and follows them in the community °  °[facility]  (866) 331-1348   °[employer] Way  (888) 892-1162   ° °Agencies that provide inexpensive medical care: °Organization          Address  Phone   Notes  °[facility]  (336) 832-8035   °[facility]    (336) 832-7272   °[facility] [address] °[place], [place] [address] (336) 832-4777   °[facility] [address], °[place] (336) 271-4999   °[facility]    (336) 373-0678   °[facility]    (336) 272-1050   °[facility] ° [address], [place] Phone:  (336) 832-4444, Fax:  (336) 832-4440 Hours of Operation:  9 am - 6 pm, M-F.  Also accepts Medicaid/Medicare and self-pay.  °[facility] ° [address], [place] Phone: (336) 832-3150, Fax: (336) [phone]. Hours of Operation:  8:30 am - 5:30 pm, M-F.  Also accepts Medicaid and self-pay.  °[facility] [place] [address], [place] Phone: (336) 878-6027   °[facility] [address], [place], [place] (336)723-1848, Ext. 123 Mondays & Thursdays: 7-9 AM.  First 15 patients are seen on a first come, first serve basis. °  ° °Medicaid-accepting [place] Providers: ° °Organization         Address  Phone   Notes  °[facility] [address], [place] (336) 641-2100 Also accepts self-pay patients.  °[facility] [address], [place] ° (336) 856-9996   °[facility] [address], [place] (336) 288-8857   °[facility] [address] [place] [address], [place] (336) 299-7000   °Veita Bland [address], [place]  ° (336) 373-1557 Only accepts [place] Access Medicaid patients after they have their name applied to their card.  ° °Self-Pay (no insurance) in [place]: ° °Organization         Address  Phone   Notes  °Sickle Cell Patients, [facility] [address], [place] (336) 832-1970   °[facility] [address], [place] (336) 832-4400    °[facility] ° [address], [place] (336) 992-4800   °[facility]/Dr. Osei-Bonsu ° [address] [place] [address], [place] or [address], [place] (336) 841-8500 Phone number for both [place] and [place] locations is the same.  °[facility] [address] [address], [place] (336) 299-0000   °[facility] [address] [place] [address],  or [address] (336) 852-7530 °(336) 878-2260   °[facility] [address],  (336) 350-1642, phone; (336) 294-5005, fax Sees patients 1st and 3rd Saturday of every month.  Must not qualify for public or private insurance (i.e. Medicaid, Medicare, [place] Health Choice, Veterans' Benefits) • Household income should be no more than 200% of the poverty level •The   clinic cannot treat you if you are pregnant or think you are pregnant • Sexually transmitted diseases are not treated at the clinic.  ° ° °Dental Care: °Organization         Address  Phone  Notes  °[place] [facility] [address], [place] (336) 641-6152 Accepts children up to age 21 who are enrolled in Medicaid or [place] Health Choice; pregnant women with a Medicaid card; and children who have applied for Medicaid or [place] Health Choice, but were declined, whose parents can pay a reduced fee at time of service.  °[facility] [place]  [address], [place] (336) 641-7733 Accepts children up to age 21 who are enrolled in Medicaid or [place] Health Choice; pregnant women with a Medicaid card; and children who have applied for Medicaid or [place] Health Choice, but were declined, whose parents can pay a reduced fee at time of service.  °[facility] ° [address], [place] (336) 641-4533 Patients are seen by appointment only. Walk-ins are not accepted. [facility] will see patients 18  years of age and older. °Monday - Tuesday (8am-5pm) °Most Wednesdays (8:30-5pm) °$30 per visit, cash only  °[facility] ° [address], [place] (336) 641-4533 Patients are seen by appointment only. Walk-ins are not accepted. [facility] will see patients 18 years of age and older. °One Wednesday Evening (Monthly: Volunteer Based).  $30 per visit, cash only  °[facility]  (919) 537-3737 for adults; Children under age 4, call Graduate Pediatric Dentistry at ([phone]) [phone]. Children aged 4-14, please call (919) [phone] to request a pediatric application. ° Dental services are provided in all areas of dental care including fillings, crowns and bridges, complete and partial dentures, implants, gum treatment, root canals, and extractions. Preventive care is also provided. Treatment is provided to both adults and children. °Patients are selected via a lottery and there is often a waiting list. °  °[facility] [address], °[place] ° (336) 763-8833 [email or web address].[email or web address] °  °[facility] [address], [place], [place] (336)723-1848, Ext. 123 Second and Fourth Thursday of each month, opens at 6:30 AM; Clinic ends at 9 AM.  Patients are seen on a first-come first-served basis, and a limited number are seen during each clinic.  ° °[facility] ° [address], [place], [place] (336) 723-7904   Eligibility Requirements °You must have lived in [place], [place], or [place] counties for at least the last three months. °  You cannot be eligible for state or federal sponsored healthcare insurance, including Veterans Administration, Medicaid, or Medicare. °  You generally cannot be eligible for healthcare insurance through your employer.  °  How to apply: °Eligibility screenings are held every Tuesday and Wednesday afternoon from 1:00 pm until 4:00 pm. You do not need an appointment for the interview!  °[facility]  [address], [place], [place] 336-631-2330   °[place] [facility]  336-342-8273   °[place]  336-703-3100   °[facility]  336-570-6415   ° °Behavioral Health Resources in the Community: °Intensive Outpatient Programs °Organization         Address  Phone  Notes  °[facility] [address], [place], [place] 336-878-6098   °[facility] [address], [place],   [place] 336-832-9800   °[facility] [address], [place], [place] ° 336-882-2125   °[facility] [address],  °[place], [place] 1-800-853-5163 or 336-641-4981   °Substance Abuse Resources °Organization         Address  Phone  Notes  °[facility]  336-882-2125   °[facility]  336-784-9470   °[facility]  336-285-9073   °[facility]  336-845-3988   °Residential & Outpatient Substance Abuse Program  1-800-659-3381   °Psychological Services °Organization         Address  Phone  Notes  °[facility]  336- 832-9600   °[facility]  336- 378-7881   °[facility] [address], [place] 1-800-853-5163 or 336-641-4981   ° °Mobile Crisis Teams °Organization         Address  Phone  Notes  °[facility], Mobile Crisis Care Unit  1-877-626-1772   °Assertive °[facility] ° [address] [place], [place] 336-834-9664   °Sharon DeEsch [address] °[place] 336-554-5454   ° °Self-Help/Support Groups °Organization         Address  Phone             Notes  °[facility] - variety of support groups  336- 373-1402 Call for more information  °Narcotics Anonymous (NA), [facility] [address], °[place]  2 meetings at this location  ° °Residential Treatment Programs °Organization         Address  Phone  Notes  °[facility] [address],    °[place]   1-866-801-8205   °[facility] ° [address], [place], [place] 704-293-8524   °[facility] [address], [place] 336-845-3988 Admissions: 8am-3pm M-F  °[facility] [address]    °[place], [place] 336-841-1104   °[facility] [address], [place], [place] 336-379-7146   °[facility] [address]  °[place], [place] 336-285-9073   °[facility] Intensive Outpatient [address], [place], [place] 336-852-3033   °[facility] [address]  °[place], [place] 1-877-615-2722 or 336-784-9470   °[facility] ([facility] [address] [place], [place] 336-227-7417 Accepts Medicaid  °[facility] [address]  ° [place] 1-800-659-3381 Substance Abuse/Addiction Treatment  ° °[place] Behavioral Health Resources °Organization         Address  Phone  Notes  °[facility]  (888) 581-9988   °Julie Brannon, PhD [address] [place], [place]   (336) 349-5553 or (336) 951-0000   °[facility]   [address] °[place], [place] (336) 349-4454   °[facility] [address], [place], [place] (336) 342-8316 Insurance/Medicaid/sponsorship through [facility]  °[facility] [address]                                    [place],  (336) 342-8316 Therapy/tele-psych/case  °[facility] [address]  ° [place],  (336) 349-2233    °Dr. Arfeen  (336) 349-4544   °[facility] [place]  [employer] [place] [facility] 1) [address], [place] °2) [address], [place] °3)  [address], [place] (336) 349-3220 °(336) 342-7768 ° °(  336) 342-8140   °[place] [facility] (336) 342-1394 or (336) 342-3537 (After Hours)    ° ° ° °

## 2013-08-11 NOTE — ED Notes (Signed)
Patient has been drinking a lot today  -the patient fell and hit himself on the face.

## 2013-08-11 NOTE — ED Notes (Signed)
Bed: [id number] Expected date: 08/11/13 Expected time:  Means of arrival: Ambulance Comments: Drunk, LSB

## 2013-08-11 NOTE — ED Notes (Signed)
Pt discharged, waiting to sober up before final discharge off the system.

## 2013-08-11 NOTE — ED Notes (Signed)
Patient is asleep. At times he wakes up and swears, but for the most part is calm and cooperative and asleep. The patient denies any pain at this time.

## 2013-08-12 NOTE — ED Notes (Addendum)
Pt is AOx4, in areasonable dischargeable state. States after discharge, he is [place] [address], provided pt with ham sandwich, soda and water to eat, paper scrub shirt as his shirt was wet, and a bus pass to help him get home. Pt states he is going to leave the hospital premises and head straight to the [place] bus stop, declines hospital security personnel escort.

## 2014-05-01 ENCOUNTER — Encounter (HOSPITAL_COMMUNITY): Payer: Self-pay | Admitting: Family Medicine

## 2014-05-01 ENCOUNTER — Encounter (HOSPITAL_COMMUNITY): Payer: Self-pay | Admitting: *Deleted

## 2014-05-01 ENCOUNTER — Emergency Department (HOSPITAL_COMMUNITY)
Admission: EM | Admit: 2014-05-01 | Discharge: 2014-05-01 | Disposition: A | Discharge status: Other Acute Inpatient Hospital | Payer: Self-pay | Attending: Emergency Medicine | Admitting: Emergency Medicine

## 2014-05-01 ENCOUNTER — Observation Stay (HOSPITAL_COMMUNITY)
Admission: AD | Admit: 2014-05-01 | Discharge: 2014-05-02 | Disposition: A | Discharge status: Home / Self Care | Payer: Federal, State, Local not specified - Other | Source: Intra-hospital | Attending: Psychiatry | Admitting: Psychiatry

## 2014-05-01 DIAGNOSIS — F10229 Alcohol dependence with intoxication, unspecified: Secondary | ICD-10-CM | POA: Diagnosis present

## 2014-05-01 DIAGNOSIS — F419 Anxiety disorder, unspecified: Secondary | ICD-10-CM | POA: Insufficient documentation

## 2014-05-01 DIAGNOSIS — Z915 Personal history of self-harm: Secondary | ICD-10-CM | POA: Insufficient documentation

## 2014-05-01 DIAGNOSIS — Z590 Homelessness: Secondary | ICD-10-CM | POA: Insufficient documentation

## 2014-05-01 DIAGNOSIS — F1290 Cannabis use, unspecified, uncomplicated: Secondary | ICD-10-CM | POA: Insufficient documentation

## 2014-05-01 DIAGNOSIS — R4584 Anhedonia: Secondary | ICD-10-CM | POA: Diagnosis not present

## 2014-05-01 DIAGNOSIS — F329 Major depressive disorder, single episode, unspecified: Secondary | ICD-10-CM | POA: Insufficient documentation

## 2014-05-01 DIAGNOSIS — G4700 Insomnia, unspecified: Secondary | ICD-10-CM | POA: Insufficient documentation

## 2014-05-01 DIAGNOSIS — F17210 Nicotine dependence, cigarettes, uncomplicated: Secondary | ICD-10-CM | POA: Insufficient documentation

## 2014-05-01 DIAGNOSIS — F10220 Alcohol dependence with intoxication, uncomplicated: Secondary | ICD-10-CM

## 2014-05-01 DIAGNOSIS — Z720 Tobacco use: Secondary | ICD-10-CM | POA: Insufficient documentation

## 2014-05-01 DIAGNOSIS — F1020 Alcohol dependence, uncomplicated: Secondary | ICD-10-CM | POA: Diagnosis present

## 2014-05-01 LAB — CBC WITH DIFFERENTIAL/PLATELET
BASOS ABS: 0.1 10*3/uL (ref 0.0–0.1)
BASOS PCT: 2 % — AB (ref 0–1)
EOS ABS: 0.6 10*3/uL (ref 0.0–0.7)
Eosinophils Relative: 11 % — ABNORMAL HIGH (ref 0–5)
HCT: 49.1 % (ref 39.0–52.0)
HEMOGLOBIN: 16.4 g/dL (ref 13.0–17.0)
Lymphocytes Relative: 25 % (ref 12–46)
Lymphs Abs: 1.4 10*3/uL (ref 0.7–4.0)
MCH: 34 pg (ref 26.0–34.0)
MCHC: 33.4 g/dL (ref 30.0–36.0)
MCV: 101.7 fL — ABNORMAL HIGH (ref 78.0–100.0)
MONO ABS: 0.4 10*3/uL (ref 0.1–1.0)
Monocytes Relative: 8 % (ref 3–12)
NEUTROS PCT: 54 % (ref 43–77)
Neutro Abs: 3.1 10*3/uL (ref 1.7–7.7)
PLATELETS: 243 10*3/uL (ref 150–400)
RBC: 4.83 MIL/uL (ref 4.22–5.81)
RDW: 13 % (ref 11.5–15.5)
WBC: 5.6 10*3/uL (ref 4.0–10.5)

## 2014-05-01 LAB — COMPREHENSIVE METABOLIC PANEL
ALK PHOS: 92 U/L (ref 39–117)
ALT: 57 U/L — AB (ref 0–53)
ANION GAP: 8 (ref 5–15)
AST: 82 U/L — ABNORMAL HIGH (ref 0–37)
Albumin: 4.8 g/dL (ref 3.5–5.2)
BILIRUBIN TOTAL: 0.5 mg/dL (ref 0.3–1.2)
BUN: 5 mg/dL — AB (ref 6–23)
CHLORIDE: 106 meq/L (ref 96–112)
CO2: 28 mmol/L (ref 19–32)
Calcium: 9.9 mg/dL (ref 8.4–10.5)
Creatinine, Ser: 0.65 mg/dL (ref 0.50–1.35)
GLUCOSE: 103 mg/dL — AB (ref 70–99)
POTASSIUM: 4.2 mmol/L (ref 3.5–5.1)
SODIUM: 142 mmol/L (ref 135–145)
Total Protein: 8.5 g/dL — ABNORMAL HIGH (ref 6.0–8.3)

## 2014-05-01 LAB — RAPID URINE DRUG SCREEN, HOSP PERFORMED
Amphetamines: NOT DETECTED
Barbiturates: NOT DETECTED
Benzodiazepines: NOT DETECTED
Cocaine: NOT DETECTED
OPIATES: NOT DETECTED
TETRAHYDROCANNABINOL: NOT DETECTED

## 2014-05-01 LAB — ETHANOL
Alcohol, Ethyl (B): 438 mg/dL (ref 0–9)
Alcohol, Ethyl (B): 279 mg/dL — ABNORMAL HIGH (ref 0–9)

## 2014-05-01 MED ORDER — LORAZEPAM 2 MG/ML IJ SOLN: 1.0000 mg | Freq: Four times a day (QID) | INTRAMUSCULAR | Status: DC | PRN

## 2014-05-01 MED ORDER — THIAMINE HCL 100 MG/ML IJ SOLN: 100.0000 mg | Freq: Every day | INTRAMUSCULAR | Status: DC

## 2014-05-01 MED ORDER — LORAZEPAM 1 MG PO TABS: 0.0000 mg | ORAL_TABLET | Freq: Two times a day (BID) | ORAL | Status: DC

## 2014-05-01 MED ORDER — PANTOPRAZOLE SODIUM 40 MG IV SOLR
40.0000 mg | Freq: Once | INTRAVENOUS | Status: AC
  Administered 2014-05-01: 40 mg via INTRAVENOUS
  Filled 2014-05-01: qty 40

## 2014-05-01 MED ORDER — FOLIC ACID 1 MG PO TABS
1.0000 mg | ORAL_TABLET | Freq: Every day | ORAL | Status: DC
  Administered 2014-05-01: 1 mg via ORAL
  Filled 2014-05-01: qty 1

## 2014-05-01 MED ORDER — VITAMIN B-1 100 MG PO TABS
100.0000 mg | ORAL_TABLET | Freq: Every day | ORAL | Status: DC
  Administered 2014-05-01: 100 mg via ORAL
  Filled 2014-05-01: qty 1

## 2014-05-01 MED ORDER — M.V.I. ADULT IV INJ
INJECTION | Freq: Once | INTRAVENOUS | Status: AC
  Administered 2014-05-01: 02:00:00 via INTRAVENOUS
  Filled 2014-05-01: qty 1000

## 2014-05-01 MED ORDER — MAGNESIUM HYDROXIDE 400 MG/5ML PO SUSP: 30.0000 mL | Freq: Every day | ORAL | Status: DC | PRN

## 2014-05-01 MED ORDER — LORAZEPAM 1 MG PO TABS
0.0000 mg | ORAL_TABLET | Freq: Two times a day (BID) | ORAL | Status: DC
Start: 2014-05-03 — End: 2014-05-02

## 2014-05-01 MED ORDER — LORAZEPAM 1 MG PO TABS
1.0000 mg | ORAL_TABLET | Freq: Four times a day (QID) | ORAL | Status: DC | PRN
  Administered 2014-05-01: 1 mg via ORAL
  Filled 2014-05-01: qty 1

## 2014-05-01 MED ORDER — ALUM & MAG HYDROXIDE-SIMETH 200-200-20 MG/5ML PO SUSP: 30.0000 mL | ORAL | Status: DC | PRN

## 2014-05-01 MED ORDER — ACETAMINOPHEN 325 MG PO TABS: 650.0000 mg | ORAL_TABLET | Freq: Four times a day (QID) | ORAL | Status: DC | PRN

## 2014-05-01 MED ORDER — NICOTINE 21 MG/24HR TD PT24
21.0000 mg | MEDICATED_PATCH | Freq: Every day | TRANSDERMAL | Status: DC
  Administered 2014-05-02: 21 mg via TRANSDERMAL
  Filled 2014-05-01 (×3): qty 1

## 2014-05-01 MED ORDER — FOLIC ACID 1 MG PO TABS
1.0000 mg | ORAL_TABLET | Freq: Every day | ORAL | Status: DC
  Administered 2014-05-02: 1 mg via ORAL
  Filled 2014-05-01 (×3): qty 1

## 2014-05-01 MED ORDER — ADULT MULTIVITAMIN W/MINERALS CH
1.0000 | ORAL_TABLET | Freq: Every day | ORAL | Status: DC
  Administered 2014-05-01: 1 via ORAL
  Filled 2014-05-01: qty 1

## 2014-05-01 MED ORDER — NICOTINE 21 MG/24HR TD PT24
21.0000 mg | MEDICATED_PATCH | Freq: Once | TRANSDERMAL | Status: DC
  Administered 2014-05-01: 21 mg via TRANSDERMAL
  Filled 2014-05-01: qty 1

## 2014-05-01 MED ORDER — ADULT MULTIVITAMIN W/MINERALS CH
1.0000 | ORAL_TABLET | Freq: Every day | ORAL | Status: DC
  Filled 2014-05-01 (×2): qty 1

## 2014-05-01 MED ORDER — LORAZEPAM 1 MG PO TABS
0.0000 mg | ORAL_TABLET | Freq: Four times a day (QID) | ORAL | Status: DC
  Administered 2014-05-02: 1 mg via ORAL
  Filled 2014-05-01: qty 1

## 2014-05-01 MED ORDER — LORAZEPAM 1 MG PO TABS
0.0000 mg | ORAL_TABLET | Freq: Four times a day (QID) | ORAL | Status: DC
  Administered 2014-05-01 (×2): 2 mg via ORAL
  Filled 2014-05-01 (×2): qty 2

## 2014-05-01 MED ORDER — VITAMIN B-1 100 MG PO TABS
100.0000 mg | ORAL_TABLET | Freq: Every day | ORAL | Status: DC
  Filled 2014-05-01 (×2): qty 1

## 2014-05-01 MED ORDER — ONDANSETRON HCL 4 MG/2ML IJ SOLN
4.0000 mg | Freq: Once | INTRAMUSCULAR | Status: AC
  Administered 2014-05-01: 4 mg via INTRAVENOUS
  Filled 2014-05-01: qty 2

## 2014-05-01 NOTE — ED Notes (Signed)
2 pt belonging bags in nurses station by room 23

## 2014-05-01 NOTE — BH Assessment (Signed)
Assessment Note  Gary Mclaughlin is an 43 y.o. male with a history of alcohol abuse. He was found in a parking lot intoxicated. EMS was called to the scene to assist patient.. He admitted to EMS that he had been drinking "a lot". Patient requesting alcohol detox. Consequences of use include previous legal charges for public intoxication. No recent legal charges. Patient also reports occasional THC use. No recent use.  He denies SI or HI. He reports 1 prior suicide attempt in which he attempted to jump off a bridge. Sts that during the time he was intoxicated on Abien, alcohol, and Prozac. Patient reports that his current stressor is homelessness and financial issues.  No AVH. [facility]. Longest period of sobriety is 9 yrs. SEE ADDITIONAL SOCIAL HISTORY FOR DETAILS RELATED TO SUBSTANCE ABUSE.   Axis I: Depressive Disorder NOS and Alcohol Dependence Axis II: Deferred Axis III:  Past Medical History  Diagnosis Date  . ETOH abuse   . Depression    Axis IV: other psychosocial or environmental problems, problems related to social environment, problems with access to health care services and problems with primary support group Axis V: 31-40 impairment in reality testing  Past Medical History:  Past Medical History  Diagnosis Date  . ETOH abuse   . Depression     History reviewed. No pertinent past surgical history.  Family History: History reviewed. No pertinent family history.  Social History:  reports that he has been smoking Cigarettes.  He has been smoking about 1.00 pack per day for the past 0 years. He has never used smokeless tobacco. He reports that he drinks alcohol. He reports that he uses illicit drugs (Marijuana).  Additional Social History:  Alcohol / Drug Use Pain Medications: SEE MAR Prescriptions: SEE MAR Over the Counter: SEE MAR History of alcohol / drug use?: Yes Negative Consequences of Use: Legal, Personal  relationships Withdrawal Symptoms: Irritability, Tremors, Sweats, Diarrhea Substance #1 Name of Substance 1: Alcohol  1 - Age of First Use: 43 yrs old  1 - Amount (size/oz): (4) 40oz 1 - Frequency: daily for 6 yrs 1 - Duration: 6 yrs  1 - Last Use / Amount: 04/30/2014  CIWA: CIWA-Ar BP: 120/65 mmHg Pulse Rate: 94 Nausea and Vomiting: no nausea and no vomiting Tactile Disturbances: none Tremor: three Auditory Disturbances: not present Paroxysmal Sweats: three Visual Disturbances: not present Anxiety: three Headache, Fullness in Head: none present Agitation: two Orientation and Clouding of Sensorium: oriented and can do serial additions CIWA-Ar Total: 11 COWS:    Allergies: No Known Allergies  Home Medications:  (Not in a hospital admission)  OB/GYN Status:  No LMP for male patient.  General Assessment Data [facility] of Assessment: [facility] Is this a Tele or Face-to-Face Assessment?: Face-to-Face Is this an Initial Assessment or a Re-assessment for this encounter?: Initial Assessment Living Arrangements: Other (Comment) (homeless ) Can pt return to current living arrangement?: No Admission Status: Voluntary Is patient capable of signing voluntary admission?: Yes Transfer from: Acute Hospital Referral Source: Self/Family/Friend     [facility] Living Arrangements: Other (Comment) (homeless ) Name of Psychiatrist:  (No psychiatrist ) Name of Therapist:  (No therapist )  Education Status Is patient currently in school?: No  Risk to self with the past 6 months Suicidal Ideation: No Suicidal Intent: No Is patient at risk for suicide?: No Suicidal Plan?: No Access to Means: No What has been your use of drugs/alcohol  within the last 12 months?:  (n/a) Previous Attempts/Gestures: Yes How many times?:  (1x-jumped off a bridge as he was intoxicated on ETOH and Amb) Other Self Harm Risks:  (none reported ) Triggers for Past Attempts: Other (Comment)  (intoxicated on alcohol, ambien, Prozac) Intentional Self Injurious Behavior: None Family Suicide History: No Recent stressful life event(s): Other (Comment) (homelessness) Persecutory voices/beliefs?: No Depression: Yes Depression Symptoms: Feeling angry/irritable, Feeling worthless/self pity, Loss of interest in usual pleasures, Guilt, Fatigue, Isolating, Tearfulness, Insomnia, Despondent Substance abuse history and/or treatment for substance abuse?: No Suicide prevention information given to non-admitted patients: Not applicable  Risk to Others within the past 6 months Homicidal Ideation: No Thoughts of Harm to Others: No Current Homicidal Intent: No Current Homicidal Plan: No Access to Homicidal Means: No Identified Victim:  (n/a) History of harm to others?: No Assessment of Violence: None Noted Violent Behavior Description:  (patient calm and cooperative ) Does patient have access to weapons?: No Criminal Charges Pending?: No Does patient have a court date: No  Psychosis Hallucinations: None noted Delusions: None noted  Mental Status Report Appear/Hygiene: Disheveled Eye Contact: Good Motor Activity: Freedom of movement Speech: Logical/coherent Level of Consciousness: Alert Mood: Depressed Affect: Appropriate to circumstance Anxiety Level: Minimal Thought Processes: Coherent, Relevant Judgement: Unimpaired Orientation: Person, Place, Time, Situation Obsessive Compulsive Thoughts/Behaviors: None  Cognitive Functioning Concentration: Decreased Memory: Recent Intact, Remote Intact IQ: Average Insight: Fair Impulse Control: Poor Appetite: Poor Weight Loss:  (30 pounds in the past several months ) Weight Gain:  (none reported ) Sleep: Decreased Total Hours of Sleep:  (Varies) Vegetative Symptoms: None  ADLScreening [facility] Assessment Services) Patient's cognitive ability adequate to safely complete daily activities?: Yes Patient able to express need for  assistance with ADLs?: Yes Independently performs ADLs?: Yes (appropriate for developmental age)  Prior Inpatient Therapy Prior Inpatient Therapy: Yes Prior Therapy Dates:  ("Several yrs ago") Prior Therapy Facilty/Provider(s):  Teacher, adult education([facility] ) Reason for Treatment:  (alcohol )  Prior Outpatient Therapy Prior Outpatient Therapy: No Prior Therapy Dates:  (n/a) Prior Therapy Facilty/Provider(s):  (n/a) Reason for Treatment:  (n/a)  ADL Screening (condition at time of admission) Patient's cognitive ability adequate to safely complete daily activities?: Yes Is the patient deaf or have difficulty hearing?: No Does the patient have difficulty seeing, even when wearing glasses/contacts?: No Does the patient have difficulty concentrating, remembering, or making decisions?: Yes Patient able to express need for assistance with ADLs?: Yes Does the patient have difficulty dressing or bathing?: No Independently performs ADLs?: Yes (appropriate for developmental age) Does the patient have difficulty walking or climbing stairs?: No Weakness of Legs: None Weakness of Arms/Hands: None  Home Assistive Devices/Equipment Home Assistive Devices/Equipment: None    Abuse/Neglect Assessment (Assessment to be complete while patient is alone) Physical Abuse: Denies Verbal Abuse: Denies Sexual Abuse: Denies Exploitation of patient/patient's resources: Denies Self-Neglect: Denies Values / Beliefs Cultural Requests During Hospitalization: None Spiritual Requests During Hospitalization: None   Advance Directives (For Healthcare) Does patient have an advance directive?: No    Additional Information 1:1 In Past 12 Months?: No CIRT Risk: No Elopement Risk: No Does patient have medical clearance?: Yes     Disposition:  Disposition Initial Assessment Completed for this Encounter: Yes Disposition of Patient: Inpatient treatment program (Patient accepted to Observation bed#3 by Julieanne CottonJosephine, NP)  On  Site Evaluation by:   Reviewed with Physician:    Melynda RipplePerry, Naomi Fitton [facility] 05/01/2014 3:01 PM

## 2014-05-01 NOTE — Progress Notes (Signed)
[facility] INPATIENT:  Family/Significant Other Suicide Prevention Education  Suicide Prevention Education:  Patient Refusal for Family/Significant Other Suicide Prevention Education: The patient Gary Mclaughlin has refused to provide written consent for family/significant other to be provided Family/Significant Other Suicide Prevention Education during admission and/or prior to discharge.    Celene KrasRobinson, Brittane Grudzinski G 05/01/2014, 10:20 PM

## 2014-05-01 NOTE — Progress Notes (Signed)
D: Pt is a 43 year old male admitted to OBS voluntarily for alcohol use.  Pt denies SI/HI, denies hallucinations, denies pain.  Pt has anxious affect and mood.  A: Admission assessment completed with pt.  Pt provided with meal and beverage.  Non-invasive skin assessment performed.  Pt has tattoo on upper right side of his back.   R: Pt is compliant with admission process.  Pt verbally contracts for safety and reports that he will notify staff of any needs or concerns.  Will continue to monitor and assess for safety.

## 2014-05-01 NOTE — ED Notes (Signed)
OBS nurse Apolinar JunesBrandon, RN informed of patient's current symptoms and informed of patient recent medications.

## 2014-05-01 NOTE — ED Notes (Signed)
Pt transported to [facility] by [facility] transportation service for continuation of specialized care. He left in no acute distress. 

## 2014-05-01 NOTE — Progress Notes (Addendum)
  CARE MANAGEMENT ED NOTE 05/01/2014  Patient:  [facility]   Account Number:  [id number]  Date Initiated:  05/01/2014  Documentation initiated by:  Edd ArbourGIBBS,KIMBERLY  Subjective/Objective Assessment:   43 yr old self pay [place] pt picked up from a parking lot. He is ETOH intoxicated and requesting detox     Subjective/Objective Assessment Detail:   no pcp listed in EPIC Pt confirms no pcp  [facility]     Action/Plan:   CM spoke with pt see notes below   Action/Plan Detail:   Anticipated DC Date:  05/01/2014     Status Recommendation to Physician:   Result of Recommendation:    Other ED Services  Consult Working Plan    DC Planning Services  Other  Outpatient Services - Pt will follow up  PCP issues  GCCN / P4HM (established/new)    Choice offered to / List presented to:            Status of service:  Completed, signed off  ED Comments:   ED Comments Detail:  CM spoke with pt who [place]. CM discussed and provided written information for self pay pcps, importance of pcp for f/u care, www.needymeds.org, www.goodrx.com, discounted pharmacies and other [place] resources such as [facility], [facility], affordable care act,  financial assistance, DSS and  health department  Reviewed resources for [place] self pay pcps like [facility] [place], family medicine at [address], [facility], general medical clinics, [facility] plus others, medication resources, [facility] out patient pharmacies and housing Pt voiced understanding and appreciation of resources provided  Provided [facility] contact information Referral completed to [facility] after pt agreed to referral

## 2014-05-01 NOTE — BH Assessment (Signed)
[facility] Assessment Progress Note  Pt has been accepted to Observation bed 3 by Dahlia ByesJosephine Onuoha, NP. Pt signed Voluntary Admission and Consent for Treatment, as well as Consent to Release Information. Original forms [facility]. Pt's nurse, Minerva Areolaric, has been notified; he agrees to send original paperwork [facility] with [facility] via [place], and to call report to [phone] or [phone].  Doylene Canninghomas Jessamy Torosyan, MA Triage Specialist 05/01/2014 @ 15:12

## 2014-05-01 NOTE — ED Notes (Signed)
[facility] Expected date:  Expected time:  Means of arrival:  Comments: EMS 43yo M ETOH

## 2014-05-01 NOTE — ED Notes (Signed)
Per EMS, patient was picked up from a parking lot. He is ETOH intoxicated and requesting detox. When EMS asked how much ETOH he had, he stated "a lot". Denies any SI/HI.

## 2014-05-01 NOTE — BH Assessment (Signed)
TTS Gary Mclaughlin is aware of the consult.

## 2014-05-01 NOTE — ED Notes (Signed)
[facility] Expected date:  Expected time:  Means of arrival:  Comments: Hold for hall b

## 2014-05-01 NOTE — Plan of Care (Signed)
[facility] Observation Crisis Plan  Reason for Crisis Plan:  Substance Abuse   Plan of Care:  Referral for Substance Abuse  Family Support:      Current Living Environment:  Living Arrangements: Other (Comment) (homeless)  Insurance:   Hospital Account    Name Acct ID Class Status Primary Coverage   Mclaughlin Mclaughlin [id number] BEHAVIORAL HEALTH OBSERVATION [facility] [place] [address]        Guarantor Account (for Hospital Account [id number])    Name Relation to Pt Service Area Active? Acct Type   Mclaughlin Mclaughlin Self [facility] Yes [facility]   Address Phone       [address] [place], [place] [address] [phone](H)          Coverage Information (for Hospital Account [id number])    F/O Payor/Plan Precert #   [facility] [address]    Subscriber Subscriber #   Mclaughlin PlunkWhipple, Mclaughlin [id number]   Address Phone   [address] [place], [place] [address] [phone]      Legal Guardian:     Primary Care Provider:  No PCP Per Patient  Current Outpatient Providers:  none  Psychiatrist:   none  Counselor/Therapist:     Compliant with Medications:  none  Additional Information:   Mclaughlin Mclaughlin, Mclaughlin Mclaughlin 12/28/201510:21 PM

## 2014-05-01 NOTE — ED Notes (Signed)
Pt alert and oriented x4. Respirations even and unlabored, bilateral symmetrical rise and fall of chest. Skin warm and dry. In no acute distress. Denies needs.   

## 2014-05-01 NOTE — ED Provider Notes (Signed)
CSN: [id number]     Arrival date & time 05/01/14  0049 History   First MD Initiated Contact with Patient 05/01/14 0058     Chief Complaint  Patient presents with  . Alcohol Intoxication     (Consider location/radiation/quality/duration/timing/severity/associated sxs/prior Treatment) HPI  Level 5 Caveat: intoxicated. This is a 43 year old male with a history of alcohol abuse. He was found in a parking lot obviously intoxicate, and admitted to EMS that he had been drinking "a lot". He reportedly is requesting alcohol detox. He denies SI or HI. Per his cousin he is homeless and has been living in the streets due to his alcoholism and would like him to get help.  Past Medical History  Diagnosis Date  . ETOH abuse   . Depression    No past surgical history on file. No family history on file. History  Substance Use Topics  . Smoking status: Current Every Day Smoker    Types: Cigarettes  . Smokeless tobacco: Never Used  . Alcohol Use: Yes     Comment: social    Review of Systems  Unable to perform ROS   Allergies  Review of patient's allergies indicates no known allergies.  Home Medications   Prior to Admission medications   Not on File   BP 92/66 mmHg  Pulse 78  Temp(Src) 97.7 F (36.5 C) (Oral)  Resp 16  SpO2 93%   Physical Exam  General: Well-developed, well-nourished male in no acute distress; appearance consistent with age of record HENT: normocephalic; atraumatic; breath smells of alcohol Eyes: pupils equal, round and reactive to light Neck: supple Heart: regular rate and rhythm Lungs: clear to auscultation bilaterally Abdomen: soft; nondistended; nontender; bowel sounds present Extremities: No deformity; pulses normal Neurologic: Sleeping but arousable; noted to move all extremities; no facial droop Skin: Warm and dry Psychiatric: Intoxicated    ED Course  Procedures (including critical care time)   MDM   Nursing notes and vitals signs, including  pulse oximetry, reviewed.  Summary of this visit's results, reviewed by myself:  Labs:  Results for orders placed or performed during the hospital encounter of 05/01/14 (from the past 24 hour(s))  Ethanol     Status: Abnormal   Collection Time: 05/01/14  1:23 AM  Result Value Ref Range   Alcohol, Ethyl (B) 438 (HH) 0 - 9 mg/dL  Comprehensive metabolic panel     Status: Abnormal   Collection Time: 05/01/14  1:23 AM  Result Value Ref Range   Sodium 142 135 - 145 mmol/L   Potassium 4.2 3.5 - 5.1 mmol/L   Chloride 106 96 - 112 mEq/L   CO2 28 19 - 32 mmol/L   Glucose, Bld 103 (H) 70 - 99 mg/dL   BUN 5 (L) 6 - 23 mg/dL   Creatinine, Ser 4.090.65 0.50 - 1.35 mg/dL   Calcium 9.9 8.4 - 81.110.5 mg/dL   Total Protein 8.5 (H) 6.0 - 8.3 g/dL   Albumin 4.8 3.5 - 5.2 g/dL   AST 82 (H) 0 - 37 U/L   ALT 57 (H) 0 - 53 U/L   Alkaline Phosphatase 92 39 - 117 U/L   Total Bilirubin 0.5 0.3 - 1.2 mg/dL   GFR calc non Af Amer >90 >90 mL/min   GFR calc Af Amer >90 >90 mL/min   Anion gap 8 5 - 15  CBC with Differential     Status: Abnormal   Collection Time: 05/01/14  1:23 AM  Result Value Ref Range  WBC 5.6 4.0 - 10.5 K/uL   RBC 4.83 4.22 - 5.81 MIL/uL   Hemoglobin 16.4 13.0 - 17.0 g/dL   HCT 16.149.1 09.639.0 - 04.552.0 %   MCV 101.7 (H) 78.0 - 100.0 fL   MCH 34.0 26.0 - 34.0 pg   MCHC 33.4 30.0 - 36.0 g/dL   RDW 40.913.0 81.111.5 - 91.415.5 %   Platelets 243 150 - 400 K/uL   Neutrophils Relative % 54 43 - 77 %   Lymphocytes Relative 25 12 - 46 %   Monocytes Relative 8 3 - 12 %   Eosinophils Relative 11 (H) 0 - 5 %   Basophils Relative 2 (H) 0 - 1 %   Neutro Abs 3.1 1.7 - 7.7 K/uL   Lymphs Abs 1.4 0.7 - 4.0 K/uL   Monocytes Absolute 0.4 0.1 - 1.0 K/uL   Eosinophils Absolute 0.6 0.0 - 0.7 K/uL   Basophils Absolute 0.1 0.0 - 0.1 K/uL   Smear Review MORPHOLOGY UNREMARKABLE    7:04 AM Repeat EtOH pending. Dr. Silverio LayYao will follow up and make disposition. The plan is to consult TTS should he still be amenable entering a  treatment program.  Hanley SeamenJohn L Tamikka Pilger, MD 05/01/14 [phone]

## 2014-05-01 NOTE — ED Provider Notes (Signed)
  Physical Exam  BP 120/65 mmHg  Pulse 94  Temp(Src) 98.5 F (36.9 C) (Oral)  Resp 18  Ht 5\' 8"  (1.727 m)  Wt 156 lb (70.761 kg)  BMI 23.73 kg/m2  SpO2 92%  Physical Exam  ED Course  Procedures  MDM Patient alert, awake. Repeat ETOH was 279. TTS consulted.   Richardean Canalavid H Yao, MD 05/01/14 [phone]

## 2014-05-02 DIAGNOSIS — F10229 Alcohol dependence with intoxication, unspecified: Secondary | ICD-10-CM | POA: Diagnosis not present

## 2014-05-02 DIAGNOSIS — F1994 Other psychoactive substance use, unspecified with psychoactive substance-induced mood disorder: Secondary | ICD-10-CM

## 2014-05-02 DIAGNOSIS — F1099 Alcohol use, unspecified with unspecified alcohol-induced disorder: Secondary | ICD-10-CM

## 2014-05-02 MED ORDER — HYDROXYZINE HCL 25 MG PO TABS: 25.0000 mg | ORAL_TABLET | Freq: Four times a day (QID) | ORAL | Status: DC | PRN

## 2014-05-02 MED ORDER — LORAZEPAM 1 MG PO TABS
1.0000 mg | ORAL_TABLET | Freq: Four times a day (QID) | ORAL | Status: DC
  Administered 2014-05-02 (×2): 1 mg via ORAL
  Filled 2014-05-02 (×2): qty 1

## 2014-05-02 MED ORDER — ADULT MULTIVITAMIN W/MINERALS CH: 1.0000 | ORAL_TABLET | Freq: Every day | ORAL | Status: DC

## 2014-05-02 MED ORDER — ONDANSETRON 4 MG PO TBDP: 4.0000 mg | ORAL_TABLET | Freq: Four times a day (QID) | ORAL | Status: DC | PRN

## 2014-05-02 MED ORDER — LORAZEPAM 1 MG PO TABS: 1.0000 mg | ORAL_TABLET | Freq: Two times a day (BID) | ORAL | Status: DC

## 2014-05-02 MED ORDER — VITAMIN B-1 100 MG PO TABS
100.0000 mg | ORAL_TABLET | Freq: Every day | ORAL | Status: DC
  Administered 2014-05-02: 100 mg via ORAL
  Filled 2014-05-02 (×3): qty 1

## 2014-05-02 MED ORDER — LORAZEPAM 1 MG PO TABS: 1.0000 mg | ORAL_TABLET | Freq: Three times a day (TID) | ORAL | Status: DC

## 2014-05-02 MED ORDER — LORAZEPAM 1 MG PO TABS: 1.0000 mg | ORAL_TABLET | Freq: Every day | ORAL | Status: DC

## 2014-05-02 MED ORDER — ADULT MULTIVITAMIN W/MINERALS CH
1.0000 | ORAL_TABLET | Freq: Every day | ORAL | Status: DC
  Administered 2014-05-02: 1 via ORAL
  Filled 2014-05-02 (×3): qty 1

## 2014-05-02 MED ORDER — LOPERAMIDE HCL 2 MG PO CAPS: 2.0000 mg | ORAL_CAPSULE | ORAL | Status: DC | PRN

## 2014-05-02 NOTE — H&P (Signed)
Psychiatric Admission Assessment Adult  Patient Identification:  Gary Mclaughlin Date of Evaluation:  05/02/2014 Chief Complaint:  ALCOHOL USE DISORDER  History of Present Illness:: 43 y.o. Male who presented via EMS to the ED after being found in a parking lot acutely intoxicated. Patient reports a 6 year history of heavy alcohol consumption. 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  Patient feels his life spiraled out of control  With him using ever increasing amounts of alcohol to self medicate.  Patient states he ended up losing all financial independence and has been homeless for past six years. Currently earning money by collecting cans.  Patient endorses daily alcohol use drinks 4-5 40 oz beers every day.  Patient reported that he has had past legal charges for public intoxication but denies current pending charges. Patient also reports occasional THC use. No recent use. He denies SI or HI. He reports 1 prior suicide attempt in which he attempted to jump off a bridge. Patient states that he had  Been drinking, and mixing alcohol with ambien and prozac.  States that he had multiple facial fractures as a result.  Patient denies current sucidal ideation, although does endorse some mild depression, anhedonia and insomnia.  Patient is currently homeless and lives in the parking garage.   Patient denies s/s of mania.  Patient admitted to [facility] in the past for 28 days. Longest period of sobriety is 9 yrs.  Elements:  Location:  generalized. Quality:  acute. Severity:  severe. Timing:  constant. Duration:  exacerbation of chronic condition. Context:  finanicial stressors and homelessness. . Associated Signs/Synptoms: Depression Symptoms:  depressed mood, anhedonia, insomnia, suicidal attempt, anxiety,  si attempt was more than 5 years ago.   (Hypo) Manic Symptoms: denies  Anxiety Symptoms:  Panic Symptoms, Psychotic Symptoms:  Denies  PTSD:  Denies     Total Time spent with patient: 30 minutes  Psychiatric Specialty Exam: Physical Exam  Constitutional: He is oriented to person, place, and time. He appears well-developed and well-nourished.  Neurological: He is alert and oriented to person, place, and time.  Skin: Skin is warm and dry.    ROS  Blood pressure 136/97, pulse 104, temperature 98.5 F (36.9 C), temperature source Oral, resp. rate 16, height _0  (1.727 m), weight 70.761 kg (156 lb), SpO2 96 %.Body mass index is 23.73 kg/(m^2).  General Appearance: Disheveled and Guarded  Eye Contact::  Minimal  Speech:  Clear and Coherent  Volume:  Normal  Mood:  Depressed  Affect:  Congruent  Thought Process:  Coherent, Goal Directed and Logical  Orientation:  Full (Time, Place, and Person)  Thought Content:  WDL  Suicidal Thoughts:  No  Homicidal Thoughts:  No  Memory:  Immediate;   Good Recent;   Good Remote;   Good  Judgement:  Fair  Insight:  Fair  Psychomotor Activity:  Normal  Concentration:  Fair  Recall:  [place] of Knowledge:Fair  Language: Good  Akathisia:  No  Handed:  Right  AIMS (if indicated):     Assets:  Communication Skills Desire for Improvement Resilience  Sleep:       Musculoskeletal: Strength & Muscle Tone: within normal limits Gait & Station: normal Patient leans: N/A  Past Psychiatric History: Diagnosis:  Hospitalizations:  Outpatient Care:  Substance Abuse Care:  Self-Mutilation:  Suicidal Attempts:  Violent Behaviors:   Past Medical History:   Past Medical History  Diagnosis Date  . ETOH abuse   .  Depression    Loss of Consciousness:  related to suicide attempt in which patient jumped off a bridge Traumatic Brain Injury:  SI attempt Allergies:  No Known Allergies PTA Medications: No prescriptions prior to admission    Previous Psychotropic Medications:  Medication/Dose                 Substance Abuse History in the last 12 months:  Yes.    Consequences of  Substance Abuse: Legal Consequences:  has had public intox charges Family Consequences:  currently has no family support due to drinking / drug use Withdrawal Symptoms:   None  Social History:  reports that he has been smoking Cigarettes.  He has been smoking about 2.00 packs per day for the past 0 years. He has never used smokeless tobacco. He reports that he drinks alcohol. He reports that he uses illicit drugs (Marijuana). Additional Social History: Pain Medications: SEE MAR Prescriptions: SEE MAR Over the Counter: SEE MAR History of alcohol / drug use?: Yes Longest period of sobriety (when/how long): 9 years from 2000-2009 Negative Consequences of Use: Financial, Work / Youth worker, Charity fundraiser relationships, Scientist, research (physical sciences) Withdrawal Symptoms: Tremors, Sweats, Other (Comment) (anxiety) Name of Substance 1: Alcohol  1 - Age of First Use: 43 yrs old  1 - Amount (size/oz): (4) 40oz 1 - Frequency: daily for 6 yrs 1 - Duration: 6 yrs  1 - Last Use / Amount: 04/30/2014                  Current Place of Residence:   Place of Birth:   Family Members: Marital Status:  Divorced Children:  Sons:  Daughters: Relationships: Education:  HS Graduate Educational Problems/Performance: Religious Beliefs/Practices: History of Abuse (Emotional/Phsycial/Sexual) Occupational Experiences; Military History:  None. Legal History: Hobbies/Interests:  Family History:  History reviewed. No pertinent family history.  Results for orders placed or performed during the hospital encounter of 05/01/14 (from the past 72 hour(s))  Ethanol     Status: Abnormal   Collection Time: 05/01/14  1:23 AM  Result Value Ref Range   Alcohol, Ethyl (B) 438 (HH) 0 - 9 mg/dL    Comment:        LOWEST DETECTABLE LIMIT FOR SERUM ALCOHOL IS 11 mg/dL FOR MEDICAL PURPOSES ONLY REPEATED TO VERIFY CRITICAL RESULT CALLED TO, READ BACK BY AND VERIFIED WITH: SPOKE WITH ELTON,L RN [phone] [place],N   Comprehensive  metabolic panel     Status: Abnormal   Collection Time: 05/01/14  1:23 AM  Result Value Ref Range   Sodium 142 135 - 145 mmol/L    Comment: Please note change in reference range.   Potassium 4.2 3.5 - 5.1 mmol/L    Comment: Please note change in reference range. DELTA CHECK NOTED    Chloride 106 96 - 112 mEq/L   CO2 28 19 - 32 mmol/L   Glucose, Bld 103 (H) 70 - 99 mg/dL   BUN 5 (L) 6 - 23 mg/dL   Creatinine, Ser 0.65 0.50 - 1.35 mg/dL   Calcium 9.9 8.4 - 10.5 mg/dL   Total Protein 8.5 (H) 6.0 - 8.3 g/dL   Albumin 4.8 3.5 - 5.2 g/dL   AST 82 (H) 0 - 37 U/L   ALT 57 (H) 0 - 53 U/L   Alkaline Phosphatase 92 39 - 117 U/L   Total Bilirubin 0.5 0.3 - 1.2 mg/dL   GFR calc non Af Amer >90 >90 mL/min   GFR calc Af Amer >90 >90 mL/min  Comment: (NOTE) The eGFR has been calculated using the CKD EPI equation. This calculation has not been validated in all clinical situations. eGFR's persistently <90 mL/min signify possible Chronic Kidney Disease.    Anion gap 8 5 - 15  CBC with Differential     Status: Abnormal   Collection Time: 05/01/14  1:23 AM  Result Value Ref Range   WBC 5.6 4.0 - 10.5 K/uL   RBC 4.83 4.22 - 5.81 MIL/uL   Hemoglobin 16.4 13.0 - 17.0 g/dL   HCT 49.1 39.0 - 52.0 %   MCV 101.7 (H) 78.0 - 100.0 fL   MCH 34.0 26.0 - 34.0 pg   MCHC 33.4 30.0 - 36.0 g/dL   RDW 13.0 11.5 - 15.5 %   Platelets 243 150 - 400 K/uL   Neutrophils Relative % 54 43 - 77 %   Lymphocytes Relative 25 12 - 46 %   Monocytes Relative 8 3 - 12 %   Eosinophils Relative 11 (H) 0 - 5 %   Basophils Relative 2 (H) 0 - 1 %   Neutro Abs 3.1 1.7 - 7.7 K/uL   Lymphs Abs 1.4 0.7 - 4.0 K/uL   Monocytes Absolute 0.4 0.1 - 1.0 K/uL   Eosinophils Absolute 0.6 0.0 - 0.7 K/uL   Basophils Absolute 0.1 0.0 - 0.1 K/uL   Smear Review MORPHOLOGY UNREMARKABLE   Ethanol     Status: Abnormal   Collection Time: 05/01/14  7:30 AM  Result Value Ref Range   Alcohol, Ethyl (B) 279 (H) 0 - 9 mg/dL    Comment:         LOWEST DETECTABLE LIMIT FOR SERUM ALCOHOL IS 11 mg/dL FOR MEDICAL PURPOSES ONLY   Drug screen panel, emergency     Status: None   Collection Time: 05/01/14  7:55 AM  Result Value Ref Range   Opiates NONE DETECTED NONE DETECTED   Cocaine NONE DETECTED NONE DETECTED   Benzodiazepines NONE DETECTED NONE DETECTED   Amphetamines NONE DETECTED NONE DETECTED   Tetrahydrocannabinol NONE DETECTED NONE DETECTED   Barbiturates NONE DETECTED NONE DETECTED    Comment:        DRUG SCREEN FOR MEDICAL PURPOSES ONLY.  IF CONFIRMATION IS NEEDED FOR ANY PURPOSE, NOTIFY LAB WITHIN 5 DAYS.        LOWEST DETECTABLE LIMITS FOR URINE DRUG SCREEN Drug Class       Cutoff (ng/mL) Amphetamine      1000 Barbiturate      200 Benzodiazepine   161 Tricyclics       096 Opiates          300 Cocaine          300 THC              50    Psychological Evaluations:  Assessment:  Mr. Elman is a 44 year old caucasian male who presented to the emergency department via EMS due to acute intoxication.  Patient had been found in a parking ramp at time of admission patient BaC was 279  UDS was negative .  Patient endorses 6 year history of heavy drinking secondary to homelessness, and loss of significant relationships / occupational resources.  Patient endorses s/s of mild depression for which he is not currently being treated.  Denies current suicidal ideation, homicidal ideation, AVH.  Does not attend to internal stimuli,  No evidence of delusions.  Patient behavior is calm and cooperative in the observation unit.  Patient complains of sweating, chills.  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 ETOH abuse   . Depression    AXIS IV:  economic problems, housing problems, other psychosocial or environmental problems, problems related to legal system/crime and problems with primary support group AXIS V:  61-70 mild symptoms  Treatment Plan/Recommendations:  Patient will be admitted to the behavioral health observation unit.  Ativan detox protocol will be initiated.  Patient will be placed on continuous observation to maintain safety and to assess withdrawal symptoms. Referrals for substance abuse and recovery programs.   Treatment Plan Summary: Daily contact with patient to assess and evaluate symptoms and progress in treatment Medication management reasses and evaluate symptoms for further treatment recommendations.  Current Medications:  Current Facility-Administered Medications  Medication Dose Route Frequency Provider Last Rate Last Dose  . acetaminophen (TYLENOL) tablet 650 mg  650 mg Oral Q6H PRN Delfin Gant, NP      . alum & mag hydroxide-simeth (MAALOX/MYLANTA) 200-200-20 MG/5ML suspension 30 mL  30 mL Oral Q4H PRN Delfin Gant, NP      . folic acid (FOLVITE) tablet 1 mg  1 mg Oral Daily Delfin Gant, NP   1 mg at 05/02/14 0856  . hydrOXYzine (ATARAX/VISTARIL) tablet 25 mg  25 mg Oral Q6H PRN Nicholaus Bloom, MD      . loperamide (IMODIUM) capsule 2-4 mg  2-4 mg Oral PRN Nicholaus Bloom, MD      . LORazepam (ATIVAN) tablet 1 mg  1 mg Oral QID Nicholaus Bloom, MD   1 mg at 05/02/14 1203   Followed by  . [START ON 05/03/2014] LORazepam (ATIVAN) tablet 1 mg  1 mg Oral TID Nicholaus Bloom, MD       Followed by  . [START ON 05/04/2014] LORazepam (ATIVAN) tablet 1 mg  1 mg Oral BID Nicholaus Bloom, MD       Followed by  . [START ON 05/05/2014] LORazepam (ATIVAN) tablet 1 mg  1 mg Oral Daily Nicholaus Bloom, MD      . magnesium hydroxide (MILK OF MAGNESIA) suspension 30 mL  30 mL Oral Daily PRN  Delfin Gant, NP      . multivitamin with minerals tablet 1 tablet  1 tablet Oral Daily Nicholaus Bloom, MD   1 tablet at 05/02/14 0856  . nicotine (NICODERM CQ - dosed in mg/24 hours) patch 21 mg  21 mg Transdermal Daily Nicholaus Bloom, MD   21 mg at 05/02/14 0858  . ondansetron (ZOFRAN-ODT) disintegrating tablet 4 mg  4 mg Oral Q6H PRN Nicholaus Bloom, MD      . thiamine (VITAMIN B-1) tablet 100 mg  100 mg Oral Daily Nicholaus Bloom, MD   100 mg at 05/02/14 0109    Observation Level/Precautions:  Continuous Observation Detox  Laboratory:  CBC Chemistry Profile UDS  Psychotherapy:    Medications:    Consultations:    Discharge Concerns:   Safety, homelessness,   Estimated LOS: 23 hours   Other:       Kennedy Bucker PMH-NP  12/29/20152:55 PM I personally  assessed the patient, reviewed the physical exam and labs and formulated the treatment plan Geralyn Flash A. Sabra Heck, M.D.

## 2014-05-02 NOTE — BH Assessment (Signed)
This Clinical research associatewriter spoke with Cordelia PenSherry [facility] she reports that patient can present to shelter for a bed between 1600-1700 today.(No guarantee of beds as beds are assigned on a first come first serve basis). Pt recommended to bring a picture ID and social security card. If patient does not have these items they will not be turned away but are encouraged to obtain these items during there first week of being in the shelter.   Consulted with Shelly Eisbach,NP whom is recommending D/C with a plan to follow-up with shelter for a bed.  SA and Crisis Resources Provided to patient prior to d/c.  Glorious PeachNajah Haja Crego, MS, LCASA Assessment Counselor

## 2014-05-02 NOTE — Progress Notes (Signed)
Patient ID: Gary PlunkRicky Mclaughlin, male   DOB: [birth year], 43 y.o.   MRN: [id number]  D: Patient denies any SI. Patient seeking outpatient treatment. Given some resources from social work department here for substance abuse. A: Obtained all belongings and prescriptions. R: Given bus pass
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
## 2014-05-02 NOTE — Discharge Instructions (Signed)
Please follow up with [facility] and [facility] using contact information in discharge packet. [facility], beds assigned first come, first serve basis.

## 2014-05-02 NOTE — Progress Notes (Signed)
Patient ID: Ellery PlunkRicky Schoeneck, male   DOB: Nov 10, 1970, 43 y.o.   MRN: [id number]  D: Patient woke up for medications this am. Patient has obvious beads of sweat on face and head. Patient on ativan protocol at present. Contracts for safety on unit. A: Staff will monitor in OBS unit and follow medication orders R: Cooperative on unit. Took all medications as ordered.

## 2014-10-06 ENCOUNTER — Emergency Department (HOSPITAL_BASED_OUTPATIENT_CLINIC_OR_DEPARTMENT_OTHER): Payer: Self-pay

## 2014-10-06 ENCOUNTER — Encounter (HOSPITAL_BASED_OUTPATIENT_CLINIC_OR_DEPARTMENT_OTHER): Payer: Self-pay

## 2014-10-06 ENCOUNTER — Emergency Department (HOSPITAL_BASED_OUTPATIENT_CLINIC_OR_DEPARTMENT_OTHER)
Admission: EM | Admit: 2014-10-06 | Discharge: 2014-10-06 | Disposition: A | Discharge status: Home / Self Care | Payer: Self-pay | Attending: Emergency Medicine | Admitting: Emergency Medicine

## 2014-10-06 DIAGNOSIS — Z87891 Personal history of nicotine dependence: Secondary | ICD-10-CM | POA: Insufficient documentation

## 2014-10-06 DIAGNOSIS — T1490XA Injury, unspecified, initial encounter: Secondary | ICD-10-CM

## 2014-10-06 DIAGNOSIS — S93602A Unspecified sprain of left foot, initial encounter: Secondary | ICD-10-CM | POA: Insufficient documentation

## 2014-10-06 DIAGNOSIS — F329 Major depressive disorder, single episode, unspecified: Secondary | ICD-10-CM | POA: Insufficient documentation

## 2014-10-06 DIAGNOSIS — Y998 Other external cause status: Secondary | ICD-10-CM | POA: Insufficient documentation

## 2014-10-06 DIAGNOSIS — M7022 Olecranon bursitis, left elbow: Secondary | ICD-10-CM | POA: Insufficient documentation

## 2014-10-06 DIAGNOSIS — X58XXXA Exposure to other specified factors, initial encounter: Secondary | ICD-10-CM | POA: Insufficient documentation

## 2014-10-06 DIAGNOSIS — Y9369 Activity, other involving other sports and athletics played as a team or group: Secondary | ICD-10-CM | POA: Insufficient documentation

## 2014-10-06 DIAGNOSIS — Y92838 Other recreation area as the place of occurrence of the external cause: Secondary | ICD-10-CM | POA: Insufficient documentation

## 2014-10-06 DIAGNOSIS — Z79899 Other long term (current) drug therapy: Secondary | ICD-10-CM | POA: Insufficient documentation

## 2014-10-06 DIAGNOSIS — M25522 Pain in left elbow: Secondary | ICD-10-CM | POA: Insufficient documentation

## 2014-10-06 MED ORDER — IBUPROFEN 800 MG PO TABS: 800.0000 mg | ORAL_TABLET | Freq: Three times a day (TID) | ORAL | Status: DC | PRN

## 2014-10-06 NOTE — ED Notes (Signed)
Patient transported to X-ray 
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
## 2014-10-06 NOTE — ED Notes (Signed)
Pt c/o left elbow pain with fluid pocket, left foot pain - s/s began today - pt states "I slid into first base." Pt has been a resident of [facility] x3 days for ETOH rehab. [facility]

## 2014-10-06 NOTE — Discharge Instructions (Signed)
Return here as needed.  Follow-up with the orthopedist as needed.  Ice and elevate foot and elbow

## 2015-04-01 DIAGNOSIS — M7701 Medial epicondylitis, right elbow: Secondary | ICD-10-CM | POA: Insufficient documentation

## 2017-01-17 ENCOUNTER — Emergency Department (HOSPITAL_COMMUNITY)
Admission: EM | Admit: 2017-01-17 | Discharge: 2017-01-17 | Discharge status: Against Medical Advice | Payer: Self-pay | Attending: Physician Assistant | Admitting: Physician Assistant

## 2017-01-17 ENCOUNTER — Encounter (HOSPITAL_COMMUNITY): Payer: Self-pay | Admitting: *Deleted

## 2017-01-17 DIAGNOSIS — Z23 Encounter for immunization: Secondary | ICD-10-CM | POA: Insufficient documentation

## 2017-01-17 DIAGNOSIS — H1132 Conjunctival hemorrhage, left eye: Secondary | ICD-10-CM | POA: Insufficient documentation

## 2017-01-17 DIAGNOSIS — S01511A Laceration without foreign body of lip, initial encounter: Secondary | ICD-10-CM | POA: Insufficient documentation

## 2017-01-17 DIAGNOSIS — Y999 Unspecified external cause status: Secondary | ICD-10-CM | POA: Insufficient documentation

## 2017-01-17 DIAGNOSIS — Y939 Activity, unspecified: Secondary | ICD-10-CM | POA: Insufficient documentation

## 2017-01-17 DIAGNOSIS — Z87891 Personal history of nicotine dependence: Secondary | ICD-10-CM | POA: Insufficient documentation

## 2017-01-17 DIAGNOSIS — Y929 Unspecified place or not applicable: Secondary | ICD-10-CM | POA: Insufficient documentation

## 2017-01-17 DIAGNOSIS — Z5320 Procedure and treatment not carried out because of patient's decision for unspecified reasons: Secondary | ICD-10-CM | POA: Insufficient documentation

## 2017-01-17 MED ORDER — LIDOCAINE HCL (PF) 1 % IJ SOLN
5.0000 mL | Freq: Once | INTRAMUSCULAR | Status: DC
  Filled 2017-01-17: qty 5

## 2017-01-17 MED ORDER — TETANUS-DIPHTH-ACELL PERTUSSIS 5-2.5-18.5 LF-MCG/0.5 IM SUSP
0.5000 mL | Freq: Once | INTRAMUSCULAR | Status: AC
  Administered 2017-01-17: 0.5 mL via INTRAMUSCULAR
  Filled 2017-01-17: qty 0.5
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
## 2017-01-17 NOTE — ED Notes (Signed)
Pt refused Ct scans
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
## 2017-01-17 NOTE — ED Notes (Addendum)
Laceration to the right upper lip , bleeding controlled

## 2017-01-17 NOTE — ED Notes (Signed)
Per Dr. Corlis Leak pt signed out AMA.  Does not want CT scan.

## 2017-03-28 ENCOUNTER — Emergency Department (HOSPITAL_COMMUNITY)
Admission: EM | Admit: 2017-03-28 | Discharge: 2017-03-28 | Disposition: A | Discharge status: Home / Self Care | Payer: Self-pay | Attending: Emergency Medicine | Admitting: Emergency Medicine

## 2017-03-28 ENCOUNTER — Encounter (HOSPITAL_COMMUNITY): Payer: Self-pay | Admitting: Emergency Medicine

## 2017-03-28 DIAGNOSIS — F10929 Alcohol use, unspecified with intoxication, unspecified: Secondary | ICD-10-CM | POA: Insufficient documentation

## 2017-03-28 DIAGNOSIS — Z87891 Personal history of nicotine dependence: Secondary | ICD-10-CM | POA: Insufficient documentation
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
## 2017-03-28 NOTE — ED Notes (Signed)
Pt unwilling to give up lighter and change clothes, would go in the bathroom and flush toilet. Spoke with Dr Effie ShyWentz regarding noncompliance, he is to leave the premises if he continues to be non compliant. Writer come back to explain to pt his options. He was in bathroom to change clothes, pt come out and would not remain compliant. Willing left premises, security with him to make sure he leaves.

## 2017-06-08 ENCOUNTER — Emergency Department (HOSPITAL_COMMUNITY): Admission: EM | Admit: 2017-06-08 | Discharge: 2017-06-08 | Discharge status: Against Medical Advice | Payer: Self-pay

## 2017-06-08 ENCOUNTER — Other Ambulatory Visit: Payer: Self-pay

## 2017-06-08 NOTE — ED Notes (Signed)
Writer called for triage room, no response 

## 2017-07-27 ENCOUNTER — Encounter (HOSPITAL_COMMUNITY): Payer: Self-pay | Admitting: Emergency Medicine

## 2017-07-27 ENCOUNTER — Emergency Department (HOSPITAL_COMMUNITY): Payer: Self-pay

## 2017-07-27 ENCOUNTER — Inpatient Hospital Stay (HOSPITAL_COMMUNITY)
Admission: EM | Admit: 2017-07-27 | Discharge: 2017-07-29 | DRG: 083 | Disposition: A | Discharge status: Home / Self Care | Payer: Self-pay | Attending: Family Medicine | Admitting: Family Medicine

## 2017-07-27 ENCOUNTER — Other Ambulatory Visit: Payer: Self-pay

## 2017-07-27 DIAGNOSIS — R402412 Glasgow coma scale score 13-15, at arrival to emergency department: Secondary | ICD-10-CM | POA: Diagnosis present

## 2017-07-27 DIAGNOSIS — D649 Anemia, unspecified: Secondary | ICD-10-CM | POA: Diagnosis present

## 2017-07-27 DIAGNOSIS — Z23 Encounter for immunization: Secondary | ICD-10-CM

## 2017-07-27 DIAGNOSIS — I609 Nontraumatic subarachnoid hemorrhage, unspecified: Secondary | ICD-10-CM

## 2017-07-27 DIAGNOSIS — Y929 Unspecified place or not applicable: Secondary | ICD-10-CM

## 2017-07-27 DIAGNOSIS — Y908 Blood alcohol level of 240 mg/100 ml or more: Secondary | ICD-10-CM | POA: Diagnosis present

## 2017-07-27 DIAGNOSIS — F10920 Alcohol use, unspecified with intoxication, uncomplicated: Secondary | ICD-10-CM

## 2017-07-27 DIAGNOSIS — F10229 Alcohol dependence with intoxication, unspecified: Secondary | ICD-10-CM | POA: Diagnosis present

## 2017-07-27 DIAGNOSIS — W19XXXA Unspecified fall, initial encounter: Secondary | ICD-10-CM | POA: Diagnosis present

## 2017-07-27 DIAGNOSIS — Z87891 Personal history of nicotine dependence: Secondary | ICD-10-CM

## 2017-07-27 DIAGNOSIS — S066X9A Traumatic subarachnoid hemorrhage with loss of consciousness of unspecified duration, initial encounter: Principal | ICD-10-CM | POA: Diagnosis present

## 2017-07-27 DIAGNOSIS — S0101XA Laceration without foreign body of scalp, initial encounter: Secondary | ICD-10-CM | POA: Diagnosis present

## 2017-07-27 DIAGNOSIS — F10929 Alcohol use, unspecified with intoxication, unspecified: Secondary | ICD-10-CM | POA: Diagnosis present

## 2017-07-27 DIAGNOSIS — F10239 Alcohol dependence with withdrawal, unspecified: Secondary | ICD-10-CM | POA: Diagnosis present

## 2017-07-27 DIAGNOSIS — S02119A Unspecified fracture of occiput, initial encounter for closed fracture: Secondary | ICD-10-CM | POA: Diagnosis present

## 2017-07-27 LAB — COMPREHENSIVE METABOLIC PANEL
ALT: 41 U/L (ref 17–63)
AST: 39 U/L (ref 15–41)
Albumin: 3.9 g/dL (ref 3.5–5.0)
Alkaline Phosphatase: 46 U/L (ref 38–126)
Anion gap: 12 (ref 5–15)
BUN: 10 mg/dL (ref 6–20)
CALCIUM: 9.3 mg/dL (ref 8.9–10.3)
CHLORIDE: 103 mmol/L (ref 101–111)
CO2: 20 mmol/L — ABNORMAL LOW (ref 22–32)
CREATININE: 0.99 mg/dL (ref 0.61–1.24)
Glucose, Bld: 103 mg/dL — ABNORMAL HIGH (ref 65–99)
Potassium: 3.9 mmol/L (ref 3.5–5.1)
Sodium: 135 mmol/L (ref 135–145)
Total Bilirubin: 0.7 mg/dL (ref 0.3–1.2)
Total Protein: 6.5 g/dL (ref 6.5–8.1)

## 2017-07-27 LAB — CBC WITH DIFFERENTIAL/PLATELET
Basophils Absolute: 0.1 10*3/uL (ref 0.0–0.1)
Basophils Relative: 1 %
EOS PCT: 3 %
Eosinophils Absolute: 0.3 10*3/uL (ref 0.0–0.7)
HCT: 36.6 % — ABNORMAL LOW (ref 39.0–52.0)
Hemoglobin: 12.4 g/dL — ABNORMAL LOW (ref 13.0–17.0)
Lymphocytes Relative: 21 %
Lymphs Abs: 1.8 10*3/uL (ref 0.7–4.0)
MCH: 31.2 pg (ref 26.0–34.0)
MCHC: 33.9 g/dL (ref 30.0–36.0)
MCV: 92 fL (ref 78.0–100.0)
Monocytes Absolute: 0.4 10*3/uL (ref 0.1–1.0)
Monocytes Relative: 5 %
Neutro Abs: 6.1 10*3/uL (ref 1.7–7.7)
Neutrophils Relative %: 70 %
PLATELETS: 306 10*3/uL (ref 150–400)
RBC: 3.98 MIL/uL — AB (ref 4.22–5.81)
RDW: 11.9 % (ref 11.5–15.5)
WBC: 8.7 10*3/uL (ref 4.0–10.5)

## 2017-07-27 LAB — PROTIME-INR
INR: 1
Prothrombin Time: 13.1 seconds (ref 11.4–15.2)

## 2017-07-27 LAB — MAGNESIUM: MAGNESIUM: 1.8 mg/dL (ref 1.7–2.4)

## 2017-07-27 LAB — CBG MONITORING, ED: GLUCOSE-CAPILLARY: 119 mg/dL — AB (ref 65–99)

## 2017-07-27 LAB — RETICULOCYTES
RBC.: 4.25 MIL/uL (ref 4.22–5.81)
RETIC COUNT ABSOLUTE: 51 10*3/uL (ref 19.0–186.0)
Retic Ct Pct: 1.2 % (ref 0.4–3.1)

## 2017-07-27 LAB — IRON AND TIBC
IRON: 61 ug/dL (ref 45–182)
Saturation Ratios: 18 % (ref 17.9–39.5)
TIBC: 340 ug/dL (ref 250–450)
UIBC: 279 ug/dL

## 2017-07-27 LAB — CK: CK TOTAL: 2699 U/L — AB (ref 49–397)

## 2017-07-27 LAB — ETHANOL: ALCOHOL ETHYL (B): 299 mg/dL — AB (ref <10)

## 2017-07-27 LAB — VITAMIN B12: Vitamin B-12: 258 pg/mL (ref 180–914)

## 2017-07-27 LAB — FOLATE: FOLATE: 21.5 ng/mL (ref >5.9)

## 2017-07-27 LAB — FERRITIN: Ferritin: 244 ng/mL (ref 24–336)

## 2017-07-27 MED ORDER — LORAZEPAM 2 MG/ML IJ SOLN
2.0000 mg | Freq: Once | INTRAMUSCULAR | Status: AC
  Administered 2017-07-27: 2 mg via INTRAVENOUS

## 2017-07-27 MED ORDER — LORAZEPAM 2 MG/ML IJ SOLN
INTRAMUSCULAR | Status: AC
  Filled 2017-07-27: qty 1

## 2017-07-27 MED ORDER — ACETAMINOPHEN 650 MG RE SUPP: 650.0000 mg | Freq: Four times a day (QID) | RECTAL | Status: DC | PRN

## 2017-07-27 MED ORDER — LORAZEPAM 2 MG/ML IJ SOLN
2.0000 mg | Freq: Once | INTRAMUSCULAR | Status: AC
  Administered 2017-07-27: 2 mg via INTRAVENOUS
  Filled 2017-07-27: qty 1

## 2017-07-27 MED ORDER — THIAMINE HCL 100 MG/ML IJ SOLN: 100.0000 mg | Freq: Every day | INTRAMUSCULAR | Status: DC

## 2017-07-27 MED ORDER — FOLIC ACID 1 MG PO TABS
1.0000 mg | ORAL_TABLET | Freq: Every day | ORAL | Status: DC
  Administered 2017-07-28 – 2017-07-29 (×2): 1 mg via ORAL
  Filled 2017-07-27 (×2): qty 1

## 2017-07-27 MED ORDER — LORAZEPAM 2 MG/ML IJ SOLN: 0.0000 mg | Freq: Two times a day (BID) | INTRAMUSCULAR | Status: DC

## 2017-07-27 MED ORDER — ONDANSETRON HCL 4 MG PO TABS: 4.0000 mg | ORAL_TABLET | Freq: Four times a day (QID) | ORAL | Status: DC | PRN

## 2017-07-27 MED ORDER — THIAMINE HCL 100 MG/ML IJ SOLN
Freq: Once | INTRAVENOUS | Status: AC
  Administered 2017-07-27: 18:00:00 via INTRAVENOUS
  Filled 2017-07-27: qty 1000

## 2017-07-27 MED ORDER — ONDANSETRON HCL 4 MG/2ML IJ SOLN: 4.0000 mg | Freq: Four times a day (QID) | INTRAMUSCULAR | Status: DC | PRN

## 2017-07-27 MED ORDER — SODIUM CHLORIDE 0.9 % IV SOLN
INTRAVENOUS | Status: AC
  Administered 2017-07-28 (×2): via INTRAVENOUS

## 2017-07-27 MED ORDER — LORAZEPAM 2 MG/ML IJ SOLN: 1.0000 mg | Freq: Four times a day (QID) | INTRAMUSCULAR | Status: DC | PRN

## 2017-07-27 MED ORDER — VITAMIN B-1 100 MG PO TABS
100.0000 mg | ORAL_TABLET | Freq: Every day | ORAL | Status: DC
  Administered 2017-07-28 – 2017-07-29 (×2): 100 mg via ORAL
  Filled 2017-07-27 (×2): qty 1

## 2017-07-27 MED ORDER — LORAZEPAM 2 MG/ML IJ SOLN: 0.0000 mg | Freq: Four times a day (QID) | INTRAMUSCULAR | Status: DC

## 2017-07-27 MED ORDER — ADULT MULTIVITAMIN W/MINERALS CH
1.0000 | ORAL_TABLET | Freq: Every day | ORAL | Status: DC
  Administered 2017-07-28 – 2017-07-29 (×2): 1 via ORAL
  Filled 2017-07-27 (×2): qty 1

## 2017-07-27 MED ORDER — LORAZEPAM 1 MG PO TABS: 1.0000 mg | ORAL_TABLET | Freq: Four times a day (QID) | ORAL | Status: DC | PRN

## 2017-07-27 MED ORDER — ACETAMINOPHEN 325 MG PO TABS
650.0000 mg | ORAL_TABLET | Freq: Four times a day (QID) | ORAL | Status: DC | PRN
  Administered 2017-07-29 (×2): 650 mg via ORAL
  Filled 2017-07-27 (×2): qty 2

## 2017-07-27 NOTE — ED Notes (Signed)
Pt sitting up in bed trying to get out so he can urinate. Pt assisted to standing position by staff so he may use urinal. NP bedside.

## 2017-07-27 NOTE — Procedures (Signed)
LACERATION REPAIR Performed by: Jimmye Normanavid John Rama Sorci Authorized by: Jimmye Normanavid John Jamone Garrido Consent: implied. Patient intoxicated with active CIWA protocol (Ativan). Patient identity confirmed: provided demographic data Prepped and Draped in normal sterile fashion Wound explored  Laceration Location: occipital scalp  Laceration Length: 3.5 cm  No Foreign Bodies seen or palpated  Area cleansed with water and hydrogen peroxide    Irrigation method: syringe Amount of cleaning: standard  Skin closure: staples  Number of staples: 4    Patient tolerance: Patient tolerated the procedure well with no immediate complications.
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
## 2017-07-27 NOTE — ED Notes (Signed)
Pt has 3.5 cm lac to back of head-- no bleeding at present.

## 2017-07-27 NOTE — ED Notes (Addendum)
Pt is unable to participate in CIWA evaluation due to altered mental status- pt is in hallway , close to nurses station re: attempting to get out of bed. Is confused as to where he is, C-collar removed by pt, refusing to keep c-collar on.

## 2017-07-27 NOTE — ED Notes (Signed)
NP at bedside, staples placed in lac to back of head- pt cooperative with 3 assist

## 2017-07-28 ENCOUNTER — Encounter (HOSPITAL_COMMUNITY): Payer: Self-pay | Admitting: *Deleted

## 2017-07-28 ENCOUNTER — Observation Stay (HOSPITAL_COMMUNITY): Payer: Self-pay

## 2017-07-28 DIAGNOSIS — I609 Nontraumatic subarachnoid hemorrhage, unspecified: Secondary | ICD-10-CM

## 2017-07-28 HISTORY — DX: Nontraumatic subarachnoid hemorrhage, unspecified: I60.9

## 2017-07-28 LAB — HEPATIC FUNCTION PANEL
ALT: 38 U/L (ref 17–63)
AST: 34 U/L (ref 15–41)
Albumin: 3.5 g/dL (ref 3.5–5.0)
Alkaline Phosphatase: 51 U/L (ref 38–126)
BILIRUBIN DIRECT: 0.1 mg/dL (ref 0.1–0.5)
Indirect Bilirubin: 0.5 mg/dL (ref 0.3–0.9)
Total Bilirubin: 0.6 mg/dL (ref 0.3–1.2)
Total Protein: 6.3 g/dL — ABNORMAL LOW (ref 6.5–8.1)

## 2017-07-28 LAB — BASIC METABOLIC PANEL
Anion gap: 11 (ref 5–15)
BUN: 7 mg/dL (ref 6–20)
CHLORIDE: 105 mmol/L (ref 101–111)
CO2: 22 mmol/L (ref 22–32)
Calcium: 9.1 mg/dL (ref 8.9–10.3)
Creatinine, Ser: 0.64 mg/dL (ref 0.61–1.24)
GFR calc Af Amer: >60 mL/min (ref >60)
GFR calc non Af Amer: >60 mL/min (ref >60)
Glucose, Bld: 99 mg/dL (ref 65–99)
POTASSIUM: 3.6 mmol/L (ref 3.5–5.1)
Sodium: 138 mmol/L (ref 135–145)

## 2017-07-28 LAB — RAPID URINE DRUG SCREEN, HOSP PERFORMED
Amphetamines: NOT DETECTED
BENZODIAZEPINES: POSITIVE — AB
Barbiturates: NOT DETECTED
Cocaine: NOT DETECTED
Opiates: NOT DETECTED
Tetrahydrocannabinol: NOT DETECTED

## 2017-07-28 LAB — MRSA PCR SCREENING: MRSA by PCR: NEGATIVE

## 2017-07-28 LAB — CBC
HEMATOCRIT: 37.8 % — AB (ref 39.0–52.0)
Hemoglobin: 13 g/dL (ref 13.0–17.0)
MCH: 31.6 pg (ref 26.0–34.0)
MCHC: 34.4 g/dL (ref 30.0–36.0)
MCV: 91.7 fL (ref 78.0–100.0)
Platelets: 315 10*3/uL (ref 150–400)
RBC: 4.12 MIL/uL — ABNORMAL LOW (ref 4.22–5.81)
RDW: 11.9 % (ref 11.5–15.5)
WBC: 11.3 10*3/uL — ABNORMAL HIGH (ref 4.0–10.5)

## 2017-07-28 LAB — CBG MONITORING, ED
GLUCOSE-CAPILLARY: 110 mg/dL — AB (ref 65–99)
GLUCOSE-CAPILLARY: 90 mg/dL (ref 65–99)
Glucose-Capillary: 95 mg/dL (ref 65–99)
Glucose-Capillary: 89 mg/dL (ref 65–99)
Glucose-Capillary: 111 mg/dL — ABNORMAL HIGH (ref 65–99)

## 2017-07-28 LAB — GLUCOSE, CAPILLARY
Glucose-Capillary: 135 mg/dL — ABNORMAL HIGH (ref 65–99)
Glucose-Capillary: 105 mg/dL — ABNORMAL HIGH (ref 65–99)

## 2017-07-28 MED ORDER — TETANUS-DIPHTH-ACELL PERTUSSIS 5-2.5-18.5 LF-MCG/0.5 IM SUSP
0.5000 mL | Freq: Once | INTRAMUSCULAR | Status: AC
  Administered 2017-07-28: 0.5 mL via INTRAMUSCULAR
  Filled 2017-07-28: qty 0.5

## 2017-07-28 NOTE — ED Notes (Signed)
Attempted to call report x 1  

## 2017-07-28 NOTE — ED Notes (Signed)
Patient transported to CT 

## 2017-07-28 NOTE — ED Notes (Signed)
Per service response, missed RN email regarding lunch tray, will put in for early dinner tray.  Pt given [place] sandwich.
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
## 2017-07-28 NOTE — ED Notes (Signed)
Checked patient cbg it was 95 notified Rn

## 2017-07-28 NOTE — Progress Notes (Signed)
Patient admitted to room 4NP13 from ED at tghis time. Alert and in stable condition. Oriented to room and call bell within reach. Bed in lowest position.

## 2017-07-28 NOTE — ED Notes (Signed)
Lunch tray ordered for pt.

## 2017-07-28 NOTE — ED Notes (Signed)
Pt returned from CT °

## 2017-07-29 DIAGNOSIS — I609 Nontraumatic subarachnoid hemorrhage, unspecified: Secondary | ICD-10-CM

## 2017-07-29 LAB — GLUCOSE, CAPILLARY
GLUCOSE-CAPILLARY: 97 mg/dL (ref 65–99)
Glucose-Capillary: 101 mg/dL — ABNORMAL HIGH (ref 65–99)
Glucose-Capillary: 103 mg/dL — ABNORMAL HIGH (ref 65–99)

## 2017-07-29 LAB — HIV ANTIBODY (ROUTINE TESTING W REFLEX): HIV Screen 4th Generation wRfx: NONREACTIVE

## 2017-07-29 NOTE — Progress Notes (Signed)
Charge RN paged neurosurgery office regarding f/u CT scan per Cena BentonVega MD.  Per office, page sent to on call MD with Vega's request.
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
## 2017-07-29 NOTE — Progress Notes (Signed)
Dr. Cena BentonVega notified patient reports difficulty hearing like he is "in a tunnel."

## 2017-07-29 NOTE — Progress Notes (Signed)
Pt d/c with bus pass. Via wheelchair. Iv removed, catheter intact. Pt states he will follow up in the ER to have staples removed.
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
## 2017-07-29 NOTE — Progress Notes (Signed)
Text message sent to Dr Cena Benton with no Neuro Note in chart to follow up with order to consult Neuro for CT results and please advise.

## 2017-07-30 ENCOUNTER — Encounter (HOSPITAL_COMMUNITY): Payer: Self-pay | Admitting: Emergency Medicine

## 2018-07-16 ENCOUNTER — Emergency Department (HOSPITAL_BASED_OUTPATIENT_CLINIC_OR_DEPARTMENT_OTHER)
Admission: EM | Admit: 2018-07-16 | Discharge: 2018-07-16 | Disposition: A | Discharge status: Home / Self Care | Payer: Self-pay | Attending: Emergency Medicine | Admitting: Emergency Medicine

## 2018-07-16 ENCOUNTER — Encounter (HOSPITAL_BASED_OUTPATIENT_CLINIC_OR_DEPARTMENT_OTHER): Payer: Self-pay | Admitting: Emergency Medicine

## 2018-07-16 ENCOUNTER — Other Ambulatory Visit: Payer: Self-pay

## 2018-07-16 DIAGNOSIS — Z87891 Personal history of nicotine dependence: Secondary | ICD-10-CM | POA: Insufficient documentation

## 2018-07-16 DIAGNOSIS — Z0000 Encounter for general adult medical examination without abnormal findings: Secondary | ICD-10-CM | POA: Insufficient documentation

## 2018-07-16 NOTE — ED Provider Notes (Signed)
[facility] EMERGENCY DEPARTMENT Provider Note   CSN: [id number] Arrival date & time: 07/16/18  7858    History   Chief Complaint Chief Complaint  Patient presents with   Medical Clearance    HPI Gary Mclaughlin is a 48 y.o. male with a hx of EtOH abuse, depression, anemia, & prior SAH who presents to the ED from [facility] ([facility]idental tx) for medical clearance. Patient states he was recently at [facility] for EtOH detox for 21 days. He states that throughout his stay he had been on Buspar for a period of time for anxiety. He had not had Buspar since leaving the facility 06/21/18. He states he has remained sober and has transitioned to [employer] for continued support. [facility] has sent him to the ER for mental health evaluation to determine if continuation of Buspar is necessary. Patient again states he has not had this medicine since 06/21/18. He noted he has been doing well without the medication and he personally does not feel it is necessary anymore. He has continued sobriety & denies SI, HI, hallucinations, anxiety, or depression. No specific alleviating/aggravating factors. He has no physical complaints at this time.      HPI  Past Medical History:  Diagnosis Date   Depression    ETOH abuse     Patient Active Problem List   Diagnosis Date Noted   Subarachnoid hemorrhage (HCC) 07/28/2017   SAH (subarachnoid hemorrhage) (HCC) 07/27/2017   Alcohol intoxication (HCC) 07/27/2017   Normocytic normochromic anemia 07/27/2017   Alcohol dependence (HCC) 05/01/2014    History reviewed. No pertinent surgical history.      Home Medications    Prior to Admission medications   Medication Sig Start Date End Date Taking? Authorizing Provider  hydrOXYzine (ATARAX/VISTARIL) 25 MG tablet Take 1 tablet (25 mg total) by mouth every 6 (six) hours as needed for anxiety. Patient not taking: Reported on 01/17/2017 05/02/14   Maurice March, NP  ibuprofen  (ADVIL,MOTRIN) 800 MG tablet Take 1 tablet (800 mg total) by mouth every 8 (eight) hours as needed. Patient not taking: Reported on 01/17/2017 10/06/14   Charlestine Night, PA-C  Multiple Vitamin (MULTIVITAMIN WITH MINERALS) TABS tablet Take 1 tablet by mouth daily. Patient not taking: Reported on 01/17/2017 05/02/14   Maurice March, NP    Family History Family History  Family history unknown: Yes    Social History Social History   Tobacco Use   Smoking status: Former Smoker    Packs/day: 1.00    Years: 25.00    Pack years: 25.00    Types: Cigarettes   Smokeless tobacco: Never Used   Tobacco comment: Declined  Substance Use Topics   Alcohol use: Not Currently    Comment: Rehab 05/26/18   Drug use: No    Types: Marijuana    Comment: hx of mj use     Allergies   Patient has no known allergies.   Review of Systems Review of Systems  Constitutional: Negative for chills and fever.  Respiratory: Negative for shortness of breath.   Cardiovascular: Negative for chest pain.  Gastrointestinal: Negative for abdominal pain.  Psychiatric/Behavioral: Negative for behavioral problems, confusion, hallucinations, self-injury and suicidal ideas. The patient is not nervous/anxious and is not hyperactive.      Physical Exam Updated Vital Signs BP (!) 118/92 (BP Location: Right Arm)    Pulse 86    Temp 98.4 F (36.9 C) (Oral)    Resp 18    Ht 5'  8" (1.727 m)    Wt 74.8 kg    SpO2 95%    BMI 25.09 kg/m   Physical Exam Vitals signs and nursing note reviewed.  Constitutional:      General: He is not in acute distress.    Appearance: He is well-developed.  HENT:     Head: Normocephalic and atraumatic.  Eyes:     General:        Right eye: No discharge.        Left eye: No discharge.     Conjunctiva/sclera: Conjunctivae normal.  Cardiovascular:     Rate and Rhythm: Normal rate.  Pulmonary:     Effort: Pulmonary effort is normal.  Neurological:     Mental Status: He is  alert.     Comments: Clear speech.   Psychiatric:        Attention and Perception: He is attentive. He does not perceive auditory or visual hallucinations.        Mood and Affect: Mood and affect normal. Mood is not anxious.        Behavior: Behavior normal.        Thought Content: Thought content normal. Thought content is not paranoid or delusional. Thought content does not include homicidal or suicidal ideation. Thought content does not include homicidal or suicidal plan.    ED Treatments / Results  Labs (all labs ordered are listed, but only abnormal results are displayed) Labs Reviewed - No data to display  EKG None  Radiology No results found.  Procedures Procedures (including critical care time)  Medications Ordered in ED Medications - No data to display   Initial Impression / Assessment and Plan / ED Course  I have reviewed the triage vital signs and the nursing notes.  Pertinent labs & imaging results that were available during my care of the patient were reviewed by me and considered in my medical decision making (see chart for details).   Patient presents to the ER from residential facility after EtOH detox for medical clearance. Patient has not had Buspar in almost 1 month at this point. He denies complaints of anxiety, depression, SI, HI, or hallucinations. He is completley asymptomatic. No indication for emergent consultation to behavioral health. Outpatient follow up with psychiatry/PCP for medication management & return precautions discussed.  Provided opportunity for questions, patient confirmed understanding and is in agreement with plan. Discussed w/ supervising physician Dr. Anitra Lauth who is in agreement.   Final Clinical Impressions(s) / ED Diagnoses   Final diagnoses:  Evaluation by medical service required    ED Discharge Orders    None       Cherly Anderson, PA-C 07/16/18 1114    Gwyneth Sprout, MD 07/16/18 2130

## 2018-07-16 NOTE — Discharge Instructions (Signed)
Given that you are not having symptoms that require emergent psychiatric admission you will be discharged at this time.  Please follow-up with psychiatry for further recommendations, we have provided information for [facility] but any local psychiatrist would be appropriate. Return to the ER for new or worsening symptoms including but not limited to thoughts of self harm, thoughts of hurting others, anxiety, depression, agitation, or any other concerns.

## 2018-07-16 NOTE — ED Triage Notes (Signed)
Pt sts he is at [facility] (residential tx); sts they need him to have a mental health eval to see if he needs to continue on Buspar (last dose approx 06/21/18)

## 2021-05-02 ENCOUNTER — Ambulatory Visit (HOSPITAL_COMMUNITY)
Admission: AD | Admit: 2021-05-02 | Discharge: 2021-05-02 | Disposition: A | Discharge status: Home / Self Care | Payer: No Payment, Other | Attending: Psychiatry | Admitting: Psychiatry

## 2021-05-02 DIAGNOSIS — U071 COVID-19: Secondary | ICD-10-CM | POA: Insufficient documentation

## 2021-05-02 DIAGNOSIS — F10129 Alcohol abuse with intoxication, unspecified: Secondary | ICD-10-CM | POA: Diagnosis not present

## 2021-05-02 LAB — POCT URINE DRUG SCREEN - MANUAL ENTRY (I-SCREEN)
POC Amphetamine UR: NOT DETECTED
POC Buprenorphine (BUP): NOT DETECTED
POC Cocaine UR: NOT DETECTED
POC Marijuana UR: NOT DETECTED
POC Methadone UR: NOT DETECTED
POC Methamphetamine UR: NOT DETECTED
POC Morphine: NOT DETECTED
POC Oxazepam (BZO): NOT DETECTED
POC Oxycodone UR: NOT DETECTED
POC Secobarbital (BAR): NOT DETECTED

## 2021-05-02 LAB — URINALYSIS, COMPLETE (UACMP) WITH MICROSCOPIC
Bacteria, UA: NONE SEEN
Bilirubin Urine: NEGATIVE
Glucose, UA: NEGATIVE mg/dL
Hgb urine dipstick: NEGATIVE
Ketones, ur: NEGATIVE mg/dL
Leukocytes,Ua: NEGATIVE
Nitrite: NEGATIVE
Protein, ur: NEGATIVE mg/dL
Specific Gravity, Urine: 1.008 (ref 1.005–1.030)
pH: 5 (ref 5.0–8.0)

## 2021-05-02 LAB — COMPREHENSIVE METABOLIC PANEL
ALT: 243 U/L — ABNORMAL HIGH (ref 0–44)
AST: 176 U/L — ABNORMAL HIGH (ref 15–41)
Albumin: 3.9 g/dL (ref 3.5–5.0)
Alkaline Phosphatase: 83 U/L (ref 38–126)
Anion gap: 10 (ref 5–15)
BUN: 10 mg/dL (ref 6–20)
CO2: 24 mmol/L (ref 22–32)
Calcium: 9.1 mg/dL (ref 8.9–10.3)
Chloride: 104 mmol/L (ref 98–111)
Creatinine, Ser: 0.77 mg/dL (ref 0.61–1.24)
GFR, Estimated: >60 mL/min (ref >60)
Glucose, Bld: 93 mg/dL (ref 70–99)
Potassium: 4.1 mmol/L (ref 3.5–5.1)
Sodium: 138 mmol/L (ref 135–145)
Total Bilirubin: 0.7 mg/dL (ref 0.3–1.2)
Total Protein: 7.1 g/dL (ref 6.5–8.1)

## 2021-05-02 LAB — CBC WITH DIFFERENTIAL/PLATELET
Abs Immature Granulocytes: 0.01 10*3/uL (ref 0.00–0.07)
Basophils Absolute: 0.1 10*3/uL (ref 0.0–0.1)
Basophils Relative: 2 %
Eosinophils Absolute: 0.1 10*3/uL (ref 0.0–0.5)
Eosinophils Relative: 1 %
HCT: 43.3 % (ref 39.0–52.0)
Hemoglobin: 14.9 g/dL (ref 13.0–17.0)
Immature Granulocytes: 0 %
Lymphocytes Relative: 37 %
Lymphs Abs: 1.7 10*3/uL (ref 0.7–4.0)
MCH: 30.6 pg (ref 26.0–34.0)
MCHC: 34.4 g/dL (ref 30.0–36.0)
MCV: 88.9 fL (ref 80.0–100.0)
Monocytes Absolute: 0.3 10*3/uL (ref 0.1–1.0)
Monocytes Relative: 7 %
Neutro Abs: 2.5 10*3/uL (ref 1.7–7.7)
Neutrophils Relative %: 53 %
Platelets: 225 10*3/uL (ref 150–400)
RBC: 4.87 MIL/uL (ref 4.22–5.81)
RDW: 14.1 % (ref 11.5–15.5)
WBC: 4.7 10*3/uL (ref 4.0–10.5)
nRBC: 0 % (ref 0.0–0.2)

## 2021-05-02 LAB — RESP PANEL BY RT-PCR (FLU A&B, COVID) ARPGX2
Influenza A by PCR: NEGATIVE
Influenza B by PCR: NEGATIVE
SARS Coronavirus 2 by RT PCR: POSITIVE — AB

## 2021-05-02 LAB — HEPATITIS PANEL, ACUTE
HCV Ab: NONREACTIVE
Hep A IgM: NONREACTIVE
Hep B C IgM: NONREACTIVE
Hepatitis B Surface Ag: NONREACTIVE

## 2021-05-02 LAB — HEMOGLOBIN A1C
Hgb A1c MFr Bld: 5.7 % — ABNORMAL HIGH (ref 4.8–5.6)
Mean Plasma Glucose: 116.89 mg/dL

## 2021-05-02 LAB — POC SARS CORONAVIRUS 2 AG: SARSCOV2ONAVIRUS 2 AG: POSITIVE — AB

## 2021-05-02 LAB — ETHANOL: Alcohol, Ethyl (B): 276 mg/dL — ABNORMAL HIGH (ref <10)

## 2021-05-02 LAB — POC SARS CORONAVIRUS 2 AG -  ED: SARS Coronavirus 2 Ag: POSITIVE — AB

## 2021-05-02 MED ORDER — TRAZODONE HCL 50 MG PO TABS: 50.0000 mg | ORAL_TABLET | Freq: Every evening | ORAL | Status: DC | PRN

## 2021-05-02 MED ORDER — MAGNESIUM HYDROXIDE 400 MG/5ML PO SUSP: 30.0000 mL | Freq: Every day | ORAL | Status: DC | PRN

## 2021-05-02 MED ORDER — HYDROXYZINE HCL 25 MG PO TABS: 50.0000 mg | ORAL_TABLET | Freq: Three times a day (TID) | ORAL | Status: DC | PRN

## 2021-05-02 MED ORDER — ALUM & MAG HYDROXIDE-SIMETH 200-200-20 MG/5ML PO SUSP: 30.0000 mL | ORAL | Status: DC | PRN

## 2021-05-02 MED ORDER — ACETAMINOPHEN 325 MG PO TABS: 650.0000 mg | ORAL_TABLET | Freq: Four times a day (QID) | ORAL | Status: DC | PRN

## 2021-05-02 NOTE — ED Notes (Signed)
Pt discharged to self care with bus pass in hand and AVS, reviewed and verbalized understanding of instructions. Education provided to pt on infection prevention as pt tested positive for COVID. Instructions given to quarantine to home x10 days and go to ED with sx occur. Verbalized understanding. Safety maintained.

## 2021-05-02 NOTE — ED Provider Notes (Signed)
Behavioral Health Urgent Care Medical Screening Exam  [name] MRN: [id number] Date of Evaluation: 05/02/21 Chief Complaint:   Diagnosis:  Final diagnoses:  Alcohol abuse with intoxication (HCC)    History of Present illness: Gary Mclaughlin is a 50 y.o. male presenting to [facility], accompanied by [employer], with complaints of "I'm a mental midget". Patient is intoxicated and reports a long history of alcohol dependence. Patient states that he drank of fifth of Bicardi and two 40 ounce beers approximately one hour ago. He reports that he wants to stop drinking again. Patient reported that he has participated in alcohol detoxification programs at [facility] and [facility] and was successful after he stayed about two years ago. Patient stated that he remained sober for two years until speaking with his son in September 2022. He denies SI/HI/AVH/delusions/paranoia.  Psychiatric Specialty Exam  Presentation  General Appearance:Disheveled  Eye Contact:Good  Speech:Clear and Coherent  Speech Volume:Normal  Handedness:No data recorded  Mood and Affect  Mood:Depressed  Affect:Depressed; Congruent   Thought Process  Thought Processes:Other (comment) (Patient is intoxicated)  Descriptions of Associations:Circumstantial  Orientation:Full (Time, Place and Person)  Thought Content:Logical  Diagnosis of Schizophrenia or Schizoaffective disorder in past: No   Hallucinations:None  Ideas of Reference:None  Suicidal Thoughts:No  Homicidal Thoughts:No   Sensorium  Memory:Immediate Good; Recent Good; Remote Good  Judgment:Poor  Insight:Good   Executive Functions  Concentration:Fair  Attention Span:Fair  Recall:Good  Fund of Knowledge:Good  Language:Good   Psychomotor Activity  Psychomotor Activity:Normal   Assets  Assets:Desire for Improvement; Communication Skills   Sleep  Sleep:Good  Number of hours: No data recorded  Nutritional Assessment (For OBS  and FBC admissions only) Has the patient had a weight loss or gain of 10 pounds or more in the last 3 months?: No Has the patient had a decrease in food intake/or appetite?: No Does the patient have dental problems?: No Does the patient have eating habits or behaviors that may be indicators of an eating disorder including binging or inducing vomiting?: No Has the patient recently lost weight without trying?: 0 Has the patient been eating poorly because of a decreased appetite?: 0 Malnutrition Screening Tool Score: 0    Physical Exam: Physical Exam Pulmonary:     Effort: Pulmonary effort is normal.  Neurological:     Mental Status: He is oriented to person, place, and time.  Psychiatric:        Attention and Perception: He is inattentive. He perceives auditory hallucinations. He does not perceive visual hallucinations.        Mood and Affect: Mood is depressed.        Speech: Speech normal.        Behavior: Behavior is cooperative.        Thought Content: Thought content is not paranoid or delusional. Thought content does not include homicidal or suicidal ideation. Thought content does not include homicidal or suicidal plan.        Cognition and Memory: Cognition is impaired.        Judgment: Judgment is inappropriate.   Review of Systems  Psychiatric/Behavioral:  Positive for substance abuse. Negative for hallucinations and memory loss. The patient is not nervous/anxious and does not have insomnia.   All other systems reviewed and are negative. Blood pressure 117/79, pulse 93, temperature 98.3 F (36.8 C), temperature source Oral, SpO2 96 %. There is no height or weight on file to calculate BMI.  Musculoskeletal: Strength & Muscle Tone: within normal limits Gait &  Station: normal Patient leans: N/A   [facility] MSE Discharge Disposition for Follow up and Recommendations: Based on my evaluation the patient does not appear to have an emergency medical condition and can be discharged  with resources and follow up care in outpatient services for Substance Abuse Intensive Outpatient Program   [place], NP 05/02/2021, 5:09 PM
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
## 2021-05-02 NOTE — Discharge Instructions (Signed)
Patient is instructed prior to discharge to:  In the event of worsening symptoms, patient is instructed to call the crisis hotline, 911 and or go to the nearest ED for appropriate evaluation and treatment of symptoms. To follow-up with his/her primary care provider for your other medical issues, concerns and or health care needs.

## 2021-05-02 NOTE — ED Provider Notes (Deleted)
Behavioral Health Admission H&P [facility] & O[facility])  Date: 05/02/21 Patient Name: Herby Amick MRN: [id number] Chief Complaint: No chief complaint on file.     Diagnoses:  Final diagnoses:  Alcohol abuse with intoxication ([place])    HPI: Eulogio Berman is a 50 year old male presenting to [place], accompanied by [employer], with complaints of "I'm a mental midget". Patient is intoxicated reporting that he has a long history of alcohol abuse and drinks alcohol daily since September 2022. Today, patient reports drinking a fifth of Bicardi and two 40 ounce beers approximately one hour ago. Patient reports that he wants to stop drinking again. Patient stated "I'm a rehab hopper". Patient reports previous stays at [place] for alcohol detox. Patient stated that he completed a program for alcohol detoxification two years ago and remained sober until speaking with his son in September.  PHQ 2-9:     Total Time spent with patient: 30 minutes  Musculoskeletal  Strength & Muscle Tone: within normal limits Gait & Station: unsteady Patient leans: N/A  Psychiatric Specialty Exam  Presentation General Appearance: Disheveled  Eye Contact:Good  Speech:Clear and Coherent  Speech Volume:Normal  Handedness:No data recorded  Mood and Affect  Mood:Depressed  Affect:Depressed; Congruent   Thought Process  Thought Processes:Other (comment) (Patient is intoxicated)  Descriptions of Associations:Circumstantial  Orientation:Full (Time, Place and Person)  Thought Content:Logical  Diagnosis of Schizophrenia or Schizoaffective disorder in past: No   Hallucinations:Hallucinations: None  Ideas of Reference:None  Suicidal Thoughts:Suicidal Thoughts: No  Homicidal Thoughts:Homicidal Thoughts: No   Sensorium  Memory:Immediate Good; Recent Good; Remote Good  Judgment:Poor  Insight:Good   Executive Functions  Concentration:Fair  Attention Span:Fair  [place] of  Knowledge:Good  Language:Good   Psychomotor Activity  Psychomotor Activity:Psychomotor Activity: Normal   Assets  Assets:Desire for Improvement; Communication Skills   Sleep  Sleep:Sleep: Good   Nutritional Assessment (For OBS and [facility] admissions only) Has the patient had a weight loss or gain of 10 pounds or more in the last 3 months?: No Has the patient had a decrease in food intake/or appetite?: No Does the patient have dental problems?: No Does the patient have eating habits or behaviors that may be indicators of an eating disorder including binging or inducing vomiting?: No Has the patient recently lost weight without trying?: 0 Has the patient been eating poorly because of a decreased appetite?: 0 Malnutrition Screening Tool Score: 0    Physical Exam Vitals reviewed.  Constitutional:      Appearance: He is toxic-appearing.  Pulmonary:     Effort: Pulmonary effort is normal.  Skin:    General: Skin is warm and dry.  Neurological:     Mental Status: He is oriented to person, place, and time.  Psychiatric:        Attention and Perception: He is inattentive.        Mood and Affect: Mood is depressed.        Speech: Speech normal.        Behavior: Behavior is cooperative.        Thought Content: Thought content is not paranoid or delusional. Thought content does not include homicidal or suicidal ideation. Thought content does not include homicidal or suicidal plan.        Cognition and Memory: Cognition is impaired.        Judgment: Judgment is inappropriate.   Review of Systems  Psychiatric/Behavioral:  Positive for substance abuse.   All other systems reviewed and are negative.  Blood  pressure 117/79, pulse 93, temperature 98.3 F (36.8 C), temperature source Oral, SpO2 96 %. There is no height or weight on file to calculate BMI.  Past Psychiatric History: Alcohol dependence   Is the patient at risk to self? Yes  Has the patient been a risk to self in the  past 6 months? Yes .    Has the patient been a risk to self within the distant past? Yes   Is the patient a risk to others? No   Has the patient been a risk to others in the past 6 months? No   Has the patient been a risk to others within the distant past? No   Past Medical History:  Past Medical History:  Diagnosis Date   Depression    ETOH abuse    No past surgical history on file.  Family History:  Family History  Family history unknown: Yes    Social History:  Social History   Socioeconomic History   Marital status: Single    Spouse name: Not on file   Number of children: Not on file   Years of education: Not on file   Highest education level: Not on file  Occupational History   Not on file  Tobacco Use   Smoking status: Former    Packs/day: 1.00    Years: 25.00    Pack years: 25.00    Types: Cigarettes   Smokeless tobacco: Never   Tobacco comments:    Declined  Vaping Use   Vaping Use: Never used  Substance and Sexual Activity   Alcohol use: Not Currently    Comment: Rehab 05/26/18   Drug use: No    Types: Marijuana    Comment: hx of mj use   Sexual activity: Not on file  Other Topics Concern   Not on file  Social History Narrative   ** Merged History Encounter **       Social Determinants of Health   Financial Resource Strain: Not on file  Food Insecurity: Not on file  Transportation Needs: Not on file  Physical Activity: Not on file  Stress: Not on file  Social Connections: Not on file  Intimate Partner Violence: Not on file    SDOH:  SDOH Screenings   Alcohol Screen: Not on file  Depression (PHQ2-9): Not on file  Financial Resource Strain: Not on file  Food Insecurity: Not on file  Housing: Not on file  Physical Activity: Not on file  Social Connections: Not on file  Stress: Not on file  Tobacco Use: Medium Risk   Smoking Tobacco Use: Former   Smokeless Tobacco Use: Never   Passive Exposure: Not on file  Transportation Needs: Not  on file    Last Labs:  No visits with results within 6 Month(s) from this visit.  Latest known visit with results is:  Admission on 05/01/2014, Discharged on 05/01/2014  Component Date Value Ref Range Status   Alcohol, Ethyl (B) 05/01/2014 438 (HH)  0 - 9 mg/dL Final   Comment:        LOWEST DETECTABLE LIMIT FOR SERUM ALCOHOL IS 11 mg/dL FOR MEDICAL PURPOSES ONLY REPEATED TO VERIFY CRITICAL RESULT CALLED TO, READ BACK BY AND VERIFIED WITH: SPOKE WITH ELTON,L RN [phone] [id number] COVINGTON,N    Sodium 05/01/2014 142  135 - 145 mmol/L Final   Please note change in reference range.   Potassium 05/01/2014 4.2  3.5 - 5.1 mmol/L Final   Comment: Please note change in reference range.  DELTA CHECK NOTED    Chloride 05/01/2014 106  96 - 112 mEq/L Final   CO2 05/01/2014 28  19 - 32 mmol/L Final   Glucose, Bld 05/01/2014 103 (H)  70 - 99 mg/dL Final   BUN 05/01/2014 5 (L)  6 - 23 mg/dL Final   Creatinine, Ser 05/01/2014 0.65  0.50 - 1.35 mg/dL Final   Calcium 05/01/2014 9.9  8.4 - 10.5 mg/dL Final   Total Protein 05/01/2014 8.5 (H)  6.0 - 8.3 g/dL Final   Albumin 05/01/2014 4.8  3.5 - 5.2 g/dL Final   AST 05/01/2014 82 (H)  0 - 37 U/L Final   ALT 05/01/2014 57 (H)  0 - 53 U/L Final   Alkaline Phosphatase 05/01/2014 92  39 - 117 U/L Final   Total Bilirubin 05/01/2014 0.5  0.3 - 1.2 mg/dL Final   GFR calc non Af Amer 05/01/2014 >90  >90 mL/min Final   GFR calc Af Amer 05/01/2014 >90  >90 mL/min Final   Comment: (NOTE) The eGFR has been calculated using the CKD EPI equation. This calculation has not been validated in all clinical situations. eGFR's persistently <90 mL/min signify possible Chronic Kidney Disease.    Anion gap 05/01/2014 8  5 - 15 Final   WBC 05/01/2014 5.6  4.0 - 10.5 K/uL Final   RBC 05/01/2014 4.83  4.22 - 5.81 MIL/uL Final   Hemoglobin 05/01/2014 16.4  13.0 - 17.0 g/dL Final   HCT 05/01/2014 49.1  39.0 - 52.0 % Final   MCV 05/01/2014 101.7 (H)  78.0 - 100.0 fL Final    MCH 05/01/2014 34.0  26.0 - 34.0 pg Final   MCHC 05/01/2014 33.4  30.0 - 36.0 g/dL Final   RDW 05/01/2014 13.0  11.5 - 15.5 % Final   Platelets 05/01/2014 243  150 - 400 K/uL Final   Neutrophils Relative % 05/01/2014 54  43 - 77 % Final   Lymphocytes Relative 05/01/2014 25  12 - 46 % Final   Monocytes Relative 05/01/2014 8  3 - 12 % Final   Eosinophils Relative 05/01/2014 11 (H)  0 - 5 % Final   Basophils Relative 05/01/2014 2 (H)  0 - 1 % Final   Neutro Abs 05/01/2014 3.1  1.7 - 7.7 K/uL Final   Lymphs Abs 05/01/2014 1.4  0.7 - 4.0 K/uL Final   Monocytes Absolute 05/01/2014 0.4  0.1 - 1.0 K/uL Final   Eosinophils Absolute 05/01/2014 0.6  0.0 - 0.7 K/uL Final   Basophils Absolute 05/01/2014 0.1  0.0 - 0.1 K/uL Final   Smear Review 05/01/2014 MORPHOLOGY UNREMARKABLE   Final   Opiates 05/01/2014 NONE DETECTED  NONE DETECTED Final   Cocaine 05/01/2014 NONE DETECTED  NONE DETECTED Final   Benzodiazepines 05/01/2014 NONE DETECTED  NONE DETECTED Final   Amphetamines 05/01/2014 NONE DETECTED  NONE DETECTED Final   Tetrahydrocannabinol 05/01/2014 NONE DETECTED  NONE DETECTED Final   Barbiturates 05/01/2014 NONE DETECTED  NONE DETECTED Final   Comment:        DRUG SCREEN FOR MEDICAL PURPOSES ONLY.  IF CONFIRMATION IS NEEDED FOR ANY PURPOSE, NOTIFY LAB WITHIN 5 DAYS.        LOWEST DETECTABLE LIMITS FOR URINE DRUG SCREEN Drug Class       Cutoff (ng/mL) Amphetamine      1000 Barbiturate      200 Benzodiazepine   110 Tricyclics       211 Opiates          300  Cocaine          300 THC              50    Alcohol, Ethyl (B) 05/01/2014 279 (H)  0 - 9 mg/dL Final   Comment:        LOWEST DETECTABLE LIMIT FOR SERUM ALCOHOL IS 11 mg/dL FOR MEDICAL PURPOSES ONLY     Allergies: Patient has no known allergies.  PTA Medications: (Not in a hospital admission)   Medical Decision Making  Patient will admit to the observation unit for continuous monitoring to initiate alcohol detoxification  and to determine a safe discharge.    Recommendations  Patient is recommended to admit to the observation unit for continuous treatment and to determine the appropriate level of care.  Franne Grip, NP 05/02/21  4:35 PM

## 2021-05-03 LAB — RPR: RPR Ser Ql: NONREACTIVE

## 2021-05-13 ENCOUNTER — Telehealth (HOSPITAL_COMMUNITY): Payer: Self-pay

## 2021-05-13 NOTE — BH Assessment (Signed)
Care Management - [facility]UC Follow Up Discharges   Writer attempted to make contact with patient today and was unsuccessful.  Writer left a HIPPA compliant voice message.   Per chart review, patient was provided with substance abuse outpatient resources.  

## 2021-09-19 ENCOUNTER — Emergency Department (HOSPITAL_COMMUNITY)
Admission: EM | Admit: 2021-09-19 | Discharge: 2021-09-19 | Disposition: A | Discharge status: Home / Self Care | Payer: Self-pay | Attending: Emergency Medicine | Admitting: Emergency Medicine

## 2021-09-19 ENCOUNTER — Other Ambulatory Visit: Payer: Self-pay

## 2021-09-19 ENCOUNTER — Encounter (HOSPITAL_COMMUNITY): Payer: Self-pay | Admitting: Oncology

## 2021-09-19 DIAGNOSIS — F10920 Alcohol use, unspecified with intoxication, uncomplicated: Secondary | ICD-10-CM

## 2021-09-19 DIAGNOSIS — Y908 Blood alcohol level of 240 mg/100 ml or more: Secondary | ICD-10-CM | POA: Insufficient documentation

## 2021-09-19 DIAGNOSIS — F10129 Alcohol abuse with intoxication, unspecified: Secondary | ICD-10-CM | POA: Insufficient documentation

## 2021-09-19 LAB — CBC WITH DIFFERENTIAL/PLATELET
Abs Immature Granulocytes: 0.03 10*3/uL (ref 0.00–0.07)
Basophils Absolute: 0.1 10*3/uL (ref 0.0–0.1)
Basophils Relative: 1 %
Eosinophils Absolute: 0 10*3/uL (ref 0.0–0.5)
Eosinophils Relative: 0 %
HCT: 43.9 % (ref 39.0–52.0)
Hemoglobin: 14.9 g/dL (ref 13.0–17.0)
Immature Granulocytes: 0 %
Lymphocytes Relative: 11 %
Lymphs Abs: 1.2 10*3/uL (ref 0.7–4.0)
MCH: 33.1 pg (ref 26.0–34.0)
MCHC: 33.9 g/dL (ref 30.0–36.0)
MCV: 97.6 fL (ref 80.0–100.0)
Monocytes Absolute: 0.4 10*3/uL (ref 0.1–1.0)
Monocytes Relative: 4 %
Neutro Abs: 8.7 10*3/uL — ABNORMAL HIGH (ref 1.7–7.7)
Neutrophils Relative %: 84 %
Platelets: 129 10*3/uL — ABNORMAL LOW (ref 150–400)
RBC: 4.5 MIL/uL (ref 4.22–5.81)
RDW: 16 % — ABNORMAL HIGH (ref 11.5–15.5)
WBC: 10.4 10*3/uL (ref 4.0–10.5)
nRBC: 0 % (ref 0.0–0.2)

## 2021-09-19 LAB — COMPREHENSIVE METABOLIC PANEL
ALT: 90 U/L — ABNORMAL HIGH (ref 0–44)
AST: 143 U/L — ABNORMAL HIGH (ref 15–41)
Albumin: 3.8 g/dL (ref 3.5–5.0)
Alkaline Phosphatase: 115 U/L (ref 38–126)
Anion gap: 10 (ref 5–15)
BUN: 13 mg/dL (ref 6–20)
CO2: 26 mmol/L (ref 22–32)
Calcium: 9.3 mg/dL (ref 8.9–10.3)
Chloride: 104 mmol/L (ref 98–111)
Creatinine, Ser: 0.69 mg/dL (ref 0.61–1.24)
GFR, Estimated: >60 mL/min (ref >60)
Glucose, Bld: 86 mg/dL (ref 70–99)
Potassium: 4.2 mmol/L (ref 3.5–5.1)
Sodium: 140 mmol/L (ref 135–145)
Total Bilirubin: 0.6 mg/dL (ref 0.3–1.2)
Total Protein: 7.9 g/dL (ref 6.5–8.1)

## 2021-09-19 LAB — ETHANOL: Alcohol, Ethyl (B): 363 mg/dL (ref <10)

## 2021-09-19 NOTE — ED Triage Notes (Signed)
Pt bib [facility] for ETOH detox. Pt stated to ems that if he was released from the hospital in an hour he would claim SI.

## 2021-09-19 NOTE — ED Provider Triage Note (Signed)
Emergency Medicine Provider Triage Evaluation Note  Gary Mclaughlin , a 51 y.o. male  was evaluated in triage.  Pt brought in by EMS for alcohol detox.  Per nursing report patient had told EMS that if he was released in the hospital an hour he would claim suicidal ideation in order to stay.  Patient sleeping when I enter the room.  Not arousable with verbal stimulation.  He received repeated sternal rubs and eventually woke up and mumbled "I am here what do you want".  Promptly went back to sleep. Review of Systems  Positive:  Negative:   Physical Exam  BP 123/85 (BP Location: Left Arm)   Pulse 72   Temp (!) 97.5 F (36.4 C) (Oral)   Resp 20   SpO2 97%  Gen:   Sleeping, difficult to arouse even with repeated sternal rubs Resp:  Normal effort lungs CTA bilaterally MSK:   Moves extremities without difficulty  Other:  Distal pulses intact,  Medical Decision Making  Medically screening exam initiated at 11:18 AM.  Appropriate orders placed.  Gary Mclaughlin was informed that the remainder of the evaluation will be completed by another provider, this initial triage assessment does not replace that evaluation, and the importance of remaining in the ED until their evaluation is complete.     Gary Mclaughlin, [place] 09/19/21 1120

## 2021-09-19 NOTE — ED Provider Notes (Signed)
[place] DEPT Provider Note   CSN: [id number] Arrival date & time: 09/19/21  1031     History  No chief complaint on file.   Gary Mclaughlin is a 51 y.o. male who presents to the ED f via [employer] for alcohol intoxication.  Patient was found at a bar, acutely intoxicated and brought to the emergency department.  When I first evaluated patient in triage, he was sleeping, not arousable with verbal stimulation or multiple sternal rubs.  Eventually after repeated sternal rubs and yelling in patient's ear, he woke up enough to ask what I wanted.  After patient was placed in room, I went and reevaluated patient he was alert, oriented.  He states that he was at a bar when the police called him.  He reports having a problem with alcohol abuse and would like to detox.  Does have history of withdrawal but denies seizures in the past.  Denies tremors now, denies vomiting, suicidal ideation, homicidal ideation, auditory visual hallucinations.  HPI     Home Medications Prior to Admission medications   Not on File      Allergies    Patient has no known allergies.    Review of Systems   Review of Systems  Physical Exam Updated Vital Signs BP 123/85 (BP Location: Left Arm)   Pulse 72   Temp (!) 97.5 F (36.4 C) (Oral)   Resp 20   SpO2 97%  Physical Exam Vitals and nursing note reviewed.  Constitutional:      General: He is not in acute distress.    Appearance: He is ill-appearing.     Comments: Appears intoxicated  HENT:     Head: Atraumatic.  Eyes:     Comments: Bilateral sclera injected, no drainage  Cardiovascular:     Rate and Rhythm: Normal rate and regular rhythm.     Pulses: Normal pulses.     Heart sounds: No murmur heard. Pulmonary:     Effort: Pulmonary effort is normal. No respiratory distress.     Breath sounds: Normal breath sounds.  Abdominal:     General: Abdomen is flat. There is no distension.     Palpations: Abdomen is soft.      Tenderness: There is no abdominal tenderness.  Musculoskeletal:        General: Normal range of motion.     Cervical back: Normal range of motion.     Comments: No bruises, palpable deformities or injuries noted  Skin:    General: Skin is warm and dry.     Capillary Refill: Capillary refill takes less than 2 seconds.  Neurological:     General: No focal deficit present.     Mental Status: He is alert.  Psychiatric:        Mood and Affect: Mood normal.    ED Results / Procedures / Treatments   Labs (all labs ordered are listed, but only abnormal results are displayed) Labs Reviewed  ETHANOL - Abnormal; Notable for the following components:      Result Value   Alcohol, Ethyl (B) 363 (*)    All other components within normal limits  CBC WITH DIFFERENTIAL/PLATELET - Abnormal; Notable for the following components:   RDW 16.0 (*)    Platelets 129 (*)    Neutro Abs 8.7 (*)    All other components within normal limits  COMPREHENSIVE METABOLIC PANEL - Abnormal; Notable for the following components:   AST 143 (*)    ALT 90 (*)  All other components within normal limits    EKG None  Radiology No results found.  Procedures Procedures   Medications Ordered in ED Medications - No data to display  ED Course/ Medical Decision Making/ A&P                           Medical Decision Making Amount and/or Complexity of Data Reviewed Labs: ordered.   51 year old male with history of alcohol abuse presents to the ED for evaluation for alcohol toxicity.  He reportedly drinks 1/5 of vodka a day along with three 40 ounce beers.  I ordered and personally interpreted labs which show no acute findings.  Do not think that imaging was indicated at this time as patient sustained no injury and is not complaining of any pain.  Patient remained in the hospital for about 4 hours, after which he was able to stand up, have a coherent conversation and eat and drink food and water without any  difficulties.  No evidence of withdrawal symptoms.  Discussed with patient that we do not offer detox here at this facility.  He was provided resources to both inpatient and outpatient rehabilitation services.  He declines Librium.  Discharged home in good condition. Final Clinical Impression(s) / ED Diagnoses Final diagnoses:  Alcoholic intoxication without complication [facility])    Rx / DC Orders ED Discharge Orders     None         Rodena Piety 09/19/21 1446    Davonna Belling, MD 09/19/21 1530

## 2021-09-19 NOTE — Discharge Instructions (Addendum)
I have provided resources here for both inpatient and outpatient substance abuse rehabs.  Please call 1 of these locations to set up follow-up treatment if you would like help regaining sobriety.

## 2021-09-24 ENCOUNTER — Encounter (HOSPITAL_COMMUNITY): Payer: Self-pay | Admitting: *Deleted

## 2021-09-24 ENCOUNTER — Emergency Department (HOSPITAL_COMMUNITY): Payer: Self-pay

## 2021-09-24 ENCOUNTER — Other Ambulatory Visit: Payer: Self-pay

## 2021-09-24 ENCOUNTER — Emergency Department (HOSPITAL_COMMUNITY)
Admission: EM | Admit: 2021-09-24 | Discharge: 2021-09-24 | Disposition: A | Discharge status: Home / Self Care | Payer: Self-pay | Attending: Emergency Medicine | Admitting: Emergency Medicine

## 2021-09-24 DIAGNOSIS — F1090 Alcohol use, unspecified, uncomplicated: Secondary | ICD-10-CM | POA: Insufficient documentation

## 2021-09-24 DIAGNOSIS — R4182 Altered mental status, unspecified: Secondary | ICD-10-CM | POA: Insufficient documentation

## 2021-09-24 DIAGNOSIS — R791 Abnormal coagulation profile: Secondary | ICD-10-CM | POA: Insufficient documentation

## 2021-09-24 DIAGNOSIS — R569 Unspecified convulsions: Secondary | ICD-10-CM | POA: Insufficient documentation

## 2021-09-24 DIAGNOSIS — Y901 Blood alcohol level of 20-39 mg/100 ml: Secondary | ICD-10-CM | POA: Insufficient documentation

## 2021-09-24 LAB — CBC WITH DIFFERENTIAL/PLATELET
Abs Immature Granulocytes: 0.03 10*3/uL (ref 0.00–0.07)
Basophils Absolute: 0.1 10*3/uL (ref 0.0–0.1)
Basophils Relative: 1 %
Eosinophils Absolute: 0 10*3/uL (ref 0.0–0.5)
Eosinophils Relative: 0 %
HCT: 44 % (ref 39.0–52.0)
Hemoglobin: 14.2 g/dL (ref 13.0–17.0)
Immature Granulocytes: 0 %
Lymphocytes Relative: 12 %
Lymphs Abs: 0.9 10*3/uL (ref 0.7–4.0)
MCH: 33.1 pg (ref 26.0–34.0)
MCHC: 32.3 g/dL (ref 30.0–36.0)
MCV: 102.6 fL — ABNORMAL HIGH (ref 80.0–100.0)
Monocytes Absolute: 0.6 10*3/uL (ref 0.1–1.0)
Monocytes Relative: 8 %
Neutro Abs: 5.9 10*3/uL (ref 1.7–7.7)
Neutrophils Relative %: 79 %
Platelets: 127 10*3/uL — ABNORMAL LOW (ref 150–400)
RBC: 4.29 MIL/uL (ref 4.22–5.81)
RDW: 15.9 % — ABNORMAL HIGH (ref 11.5–15.5)
WBC: 7.5 10*3/uL (ref 4.0–10.5)
nRBC: 0 % (ref 0.0–0.2)

## 2021-09-24 LAB — COMPREHENSIVE METABOLIC PANEL
ALT: 87 U/L — ABNORMAL HIGH (ref 0–44)
AST: 153 U/L — ABNORMAL HIGH (ref 15–41)
Albumin: 3.5 g/dL (ref 3.5–5.0)
Alkaline Phosphatase: 148 U/L — ABNORMAL HIGH (ref 38–126)
Anion gap: 26 — ABNORMAL HIGH (ref 5–15)
BUN: 8 mg/dL (ref 6–20)
CO2: 11 mmol/L — ABNORMAL LOW (ref 22–32)
Calcium: 9.5 mg/dL (ref 8.9–10.3)
Chloride: 100 mmol/L (ref 98–111)
Creatinine, Ser: 1.02 mg/dL (ref 0.61–1.24)
GFR, Estimated: >60 mL/min (ref >60)
Glucose, Bld: 158 mg/dL — ABNORMAL HIGH (ref 70–99)
Potassium: 3.5 mmol/L (ref 3.5–5.1)
Sodium: 137 mmol/L (ref 135–145)
Total Bilirubin: 0.9 mg/dL (ref 0.3–1.2)
Total Protein: 7.5 g/dL (ref 6.5–8.1)

## 2021-09-24 LAB — MAGNESIUM: Magnesium: 1.7 mg/dL (ref 1.7–2.4)

## 2021-09-24 LAB — ETHANOL: Alcohol, Ethyl (B): 36 mg/dL — ABNORMAL HIGH (ref <10)

## 2021-09-24 LAB — CBG MONITORING, ED: Glucose-Capillary: 168 mg/dL — ABNORMAL HIGH (ref 70–99)

## 2021-09-24 LAB — PROTIME-INR
INR: 1 (ref 0.8–1.2)
Prothrombin Time: 13.3 seconds (ref 11.4–15.2)

## 2021-09-24 MED ORDER — FOLIC ACID 1 MG PO TABS: 1.0000 mg | ORAL_TABLET | Freq: Every day | ORAL | Status: DC

## 2021-09-24 MED ORDER — ADULT MULTIVITAMIN W/MINERALS CH: 1.0000 | ORAL_TABLET | Freq: Every day | ORAL | Status: DC

## 2021-09-24 MED ORDER — LORAZEPAM 1 MG PO TABS: 1.0000 mg | ORAL_TABLET | ORAL | Status: DC | PRN

## 2021-09-24 MED ORDER — THIAMINE HCL 100 MG PO TABS: 100.0000 mg | ORAL_TABLET | Freq: Every day | ORAL | Status: DC

## 2021-09-24 MED ORDER — LEVETIRACETAM 500 MG PO TABS: 500.0000 mg | ORAL_TABLET | Freq: Two times a day (BID) | ORAL | 2 refills | Status: DC

## 2021-09-24 MED ORDER — LORAZEPAM 2 MG/ML IJ SOLN
2.0000 mg | Freq: Once | INTRAMUSCULAR | Status: AC
  Administered 2021-09-24: 2 mg via INTRAVENOUS
  Filled 2021-09-24: qty 1

## 2021-09-24 MED ORDER — LORAZEPAM 2 MG/ML IJ SOLN: 1.0000 mg | INTRAMUSCULAR | Status: DC | PRN

## 2021-09-24 MED ORDER — THIAMINE HCL 100 MG/ML IJ SOLN: 100.0000 mg | Freq: Every day | INTRAMUSCULAR | Status: DC

## 2021-09-24 MED ORDER — LEVETIRACETAM 500 MG PO TABS
500.0000 mg | ORAL_TABLET | Freq: Once | ORAL | Status: AC
  Administered 2021-09-24: 500 mg via ORAL
  Filled 2021-09-24: qty 1

## 2021-09-24 NOTE — ED Triage Notes (Signed)
Pt arrived with EMS actively seizing. Girlfriend called out for AMS. EMS reported aphasia, last known well 6pm yesterday. Initially answering questions with "yes" pt is homeless, uses alcohol daily (possibly last drink yesterday at 6pm), reportedly used an unknown drug today. Pt given 2mg  Ativan on arrival

## 2021-09-24 NOTE — ED Notes (Signed)
D/C

## 2021-09-24 NOTE — Discharge Instructions (Signed)
Your seizure could have been due to alcohol withdrawal.  I did discuss with neurology your presentation and they recommended starting you on Keppra 500 mg twice daily.  Please follow-up in clinic with neurology for repeat assessment.  Department of Motor Vehicle [facility]) of [place] regulations for seizures - It is the patient's responsibility to report the incidence of the seizure in the state of [place]. [place] has no statutory provision requiring physicians to report patients diagnosed with epilepsy or seizures to a central state agency.  The recommended DMV regulation requirement for a driver in [place] for an individual with a seizure is that they be seizure-free for 6-12 months. However, the DMV may consider the following exceptions to this general rule where: (1) a physician-directed change in medication causes a seizure and the individual immediately resumes the previous therapy which controlled seizures; (2) there is a history of nocturnal seizures or seizures which do not involve loss of consciousness, loss of control of motor function, or loss of appropriate sensation and information process; and (3) an individual has a seizure disorder preceded by an aura (warning) lasting 2-3 minutes. While the [facility] may also give consideration to other unusual circumstances which may affect the general requirement that drivers be seizure-free for 6-12 months, interpretation of these circumstances and assignment of restrictions is at the discretion of the Medical Advisor. The DMV also considers compliance with medical therapy essential for safe driving. [facility] [place] Physician's Guide to [employer] (June, 1995 ed.)] The Department learns of an individual's condition by inquiring on the application form or renewal form, a physician's report to the [facility], an accident report or from correspondence from the individual. The person may be required to submit a Medical Report Form either annually or semi-annually.

## 2021-09-24 NOTE — Progress Notes (Signed)
Outpatient and inpatient substance abuse resources attached to patients AVS.

## 2021-09-24 NOTE — ED Provider Notes (Signed)
[name] EMERGENCY DEPARTMENT Provider Note   CSN: [id number] Arrival date & time: 09/24/21  2029     History  Chief Complaint  Patient presents with   Seizures    Gary Mclaughlin is a 51 y.o. male.   Seizures  51 year old male with medical history significant for heavy alcohol use presenting to the emergency department with concern for seizure.  The patient was called out by EMS due to altered mental status.  EMS had reported some difficulty speaking which is the patient's baseline given history of TBI.  He uses alcohol daily and was possibly intoxicated.  Prior to EMS arrival, the patient began exhibiting tonic-clonic jerking with some eye gaze deviation.  Seizure activity lasted a total of 1 minute and resolved following administration of IV Ativan 2 mg.  Home Medications Prior to Admission medications   Medication Sig Start Date End Date Taking? Authorizing Provider  levETIRAcetam (KEPPRA) 500 MG tablet Take 1 tablet (500 mg total) by mouth 2 (two) times daily. 09/24/21 12/23/21 Yes Ernie Avena, MD      Allergies    Patient has no known allergies.    Review of Systems   Review of Systems  Neurological:  Positive for seizures.  All other systems reviewed and are negative.  Physical Exam Updated Vital Signs BP (!) 140/97   Pulse 82   Temp 98.7 F (37.1 C) (Oral)   Resp 17   SpO2 96%  Physical Exam Vitals and nursing note reviewed.  Constitutional:      General: He is not in acute distress.    Appearance: He is well-developed.  HENT:     Head: Normocephalic and atraumatic.  Eyes:     Conjunctiva/sclera: Conjunctivae normal.  Cardiovascular:     Rate and Rhythm: Normal rate and regular rhythm.  Pulmonary:     Effort: Pulmonary effort is normal. No respiratory distress.     Breath sounds: Normal breath sounds.  Abdominal:     Palpations: Abdomen is soft.     Tenderness: There is no abdominal tenderness.  Musculoskeletal:        General:  No swelling.     Cervical back: Neck supple.  Skin:    General: Skin is warm and dry.     Capillary Refill: Capillary refill takes less than 2 seconds.  Neurological:     Mental Status: He is alert and oriented to person, place, and time. Mental status is at baseline.     Cranial Nerves: No cranial nerve deficit.     Sensory: No sensory deficit.     Motor: No weakness.     Coordination: Coordination normal.     Gait: Gait normal.  Psychiatric:        Mood and Affect: Mood normal.    ED Results / Procedures / Treatments   Labs (all labs ordered are listed, but only abnormal results are displayed) Labs Reviewed  COMPREHENSIVE METABOLIC PANEL - Abnormal; Notable for the following components:      Result Value   CO2 11 (*)    Glucose, Bld 158 (*)    AST 153 (*)    ALT 87 (*)    Alkaline Phosphatase 148 (*)    Anion gap 26 (*)    All other components within normal limits  CBC WITH DIFFERENTIAL/PLATELET - Abnormal; Notable for the following components:   MCV 102.6 (*)    RDW 15.9 (*)    Platelets 127 (*)    All other components within  normal limits  ETHANOL - Abnormal; Notable for the following components:   Alcohol, Ethyl (B) 36 (*)    All other components within normal limits  CBG MONITORING, ED - Abnormal; Notable for the following components:   Glucose-Capillary 168 (*)    All other components within normal limits  PROTIME-INR  MAGNESIUM    EKG EKG Interpretation  Date/Time:  Tuesday Sep 24 2021 20:31:39 EDT Ventricular Rate:  111 PR Interval:  167 QRS Duration: 106 QT Interval:  328 QTC Calculation: 446 R Axis:   79 Text Interpretation: Sinus tachycardia Low voltage with right axis deviation Confirmed by Ernie AvenaLawsing, Leonardo Makris ([id number]) on 09/24/2021 8:44:45 PM  Radiology No results found.  Procedures Procedures    Medications Ordered in ED Medications  LORazepam (ATIVAN) injection 2 mg (2 mg Intravenous Given 09/24/21 2028)  levETIRAcetam (KEPPRA) tablet 500 mg  (500 mg Oral Given 09/24/21 2301)    ED Course/ Medical Decision Making/ A&P                           Medical Decision Making Amount and/or Complexity of Data Reviewed Labs: ordered. Radiology: ordered.  Risk OTC drugs. Prescription drug management.   51 year old male with medical history significant for heavy alcohol use presenting to the emergency department with concern for seizure.  The patient was called out by EMS due to altered mental status.  EMS had reported some difficulty speaking which is the patient's baseline given history of TBI.  He uses alcohol daily and was possibly intoxicated.  Prior to EMS arrival, the patient began exhibiting tonic-clonic jerking with some eye gaze deviation.  Seizure activity lasted a total of 1 minute and resolved following administration of IV Ativan 2 mg.  Laboratory work-up significant for a CBG that was normal on arrival, mildly hyperglycemic at 168, ethanol level was mildly elevated at 36, CBC without leukocytosis or anemia, CMP without significant electrolyte abnormality, anion gap acidosis with a bicarb of 11 and a gap of 26, likely starvation ketosis versus lactic acidosis in the setting of seizure.  Magnesium was normal at 1.7.  Chest x-ray was reviewed by myself and radiology, and interpreted by myself and radiology negative for active disease.  CT head was performed and revealed the following: IMPRESSION:  1. No CT evidence for acute intracranial abnormality.  2. 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  He appears to be a chronic daily alcohol abuser.  Concern for alcohol withdrawal seizure.  No fever or meningismus to suggest meningitis.  No evidence for acute TBI on CT head imaging.  I did discuss the case with on-call neurology who stated that  given the focality with some gaze deviation, recommendations included starting the patient on Keppra and follow-up outpatient with neurology.  Notification for admission at this time.  The patient on repeat evaluation had returned to his baseline with no further seizure activity. CIWA score of 0.  He was ambulatory in the ED without difficulty.  Seizure precautions were provided to the patient. Stable for discharge with outpatient neuro follow-up.   Final Clinical Impression(s) / ED Diagnoses Final diagnoses:  Seizure (HCC)    Rx / DC Orders ED Discharge Orders          Ordered    levETIRAcetam (KEPPRA) 500 MG  tablet  2 times daily        09/24/21 2254              Ernie Avena, MD 09/28/21 1757

## 2021-10-26 ENCOUNTER — Emergency Department (HOSPITAL_COMMUNITY): Payer: Self-pay

## 2021-10-26 ENCOUNTER — Inpatient Hospital Stay (HOSPITAL_COMMUNITY)
Admission: EM | Admit: 2021-10-26 | Discharge: 2021-10-30 | DRG: 190 | Disposition: A | Discharge status: Home / Self Care | Payer: Self-pay | Attending: Internal Medicine | Admitting: Internal Medicine

## 2021-10-26 ENCOUNTER — Other Ambulatory Visit: Payer: Self-pay

## 2021-10-26 DIAGNOSIS — Z720 Tobacco use: Secondary | ICD-10-CM

## 2021-10-26 DIAGNOSIS — Z5900 Homelessness unspecified: Secondary | ICD-10-CM

## 2021-10-26 DIAGNOSIS — J441 Chronic obstructive pulmonary disease with (acute) exacerbation: Principal | ICD-10-CM | POA: Diagnosis present

## 2021-10-26 DIAGNOSIS — F10939 Alcohol use, unspecified with withdrawal, unspecified: Secondary | ICD-10-CM

## 2021-10-26 DIAGNOSIS — F10220 Alcohol dependence with intoxication, uncomplicated: Secondary | ICD-10-CM | POA: Diagnosis present

## 2021-10-26 DIAGNOSIS — F17210 Nicotine dependence, cigarettes, uncomplicated: Secondary | ICD-10-CM | POA: Diagnosis present

## 2021-10-26 DIAGNOSIS — Z87820 Personal history of traumatic brain injury: Secondary | ICD-10-CM

## 2021-10-26 DIAGNOSIS — Y908 Blood alcohol level of 240 mg/100 ml or more: Secondary | ICD-10-CM | POA: Diagnosis present

## 2021-10-26 DIAGNOSIS — F10920 Alcohol use, unspecified with intoxication, uncomplicated: Secondary | ICD-10-CM

## 2021-10-26 DIAGNOSIS — F10229 Alcohol dependence with intoxication, unspecified: Secondary | ICD-10-CM | POA: Diagnosis present

## 2021-10-26 DIAGNOSIS — G40909 Epilepsy, unspecified, not intractable, without status epilepticus: Secondary | ICD-10-CM

## 2021-10-26 DIAGNOSIS — K760 Fatty (change of) liver, not elsewhere classified: Secondary | ICD-10-CM

## 2021-10-26 DIAGNOSIS — F1020 Alcohol dependence, uncomplicated: Secondary | ICD-10-CM | POA: Diagnosis present

## 2021-10-26 DIAGNOSIS — F10239 Alcohol dependence with withdrawal, unspecified: Secondary | ICD-10-CM | POA: Diagnosis present

## 2021-10-26 DIAGNOSIS — J9601 Acute respiratory failure with hypoxia: Secondary | ICD-10-CM | POA: Diagnosis present

## 2021-10-26 DIAGNOSIS — R7401 Elevation of levels of liver transaminase levels: Secondary | ICD-10-CM

## 2021-10-26 DIAGNOSIS — T17990A Other foreign object in respiratory tract, part unspecified in causing asphyxiation, initial encounter: Secondary | ICD-10-CM | POA: Diagnosis present

## 2021-10-26 LAB — PROTIME-INR
INR: 0.9 (ref 0.8–1.2)
Prothrombin Time: 11.7 seconds (ref 11.4–15.2)

## 2021-10-26 LAB — CBC WITH DIFFERENTIAL/PLATELET
Abs Immature Granulocytes: 0.01 10*3/uL (ref 0.00–0.07)
Basophils Absolute: 0.1 10*3/uL (ref 0.0–0.1)
Basophils Relative: 3 %
Eosinophils Absolute: 0.4 10*3/uL (ref 0.0–0.5)
Eosinophils Relative: 8 %
HCT: 42.9 % (ref 39.0–52.0)
Hemoglobin: 14.2 g/dL (ref 13.0–17.0)
Immature Granulocytes: 0 %
Lymphocytes Relative: 25 %
Lymphs Abs: 1.1 10*3/uL (ref 0.7–4.0)
MCH: 33.3 pg (ref 26.0–34.0)
MCHC: 33.1 g/dL (ref 30.0–36.0)
MCV: 100.7 fL — ABNORMAL HIGH (ref 80.0–100.0)
Monocytes Absolute: 0.4 10*3/uL (ref 0.1–1.0)
Monocytes Relative: 8 %
Neutro Abs: 2.4 10*3/uL (ref 1.7–7.7)
Neutrophils Relative %: 56 %
Platelets: 178 10*3/uL (ref 150–400)
RBC: 4.26 MIL/uL (ref 4.22–5.81)
RDW: 15.6 % — ABNORMAL HIGH (ref 11.5–15.5)
WBC: 4.3 10*3/uL (ref 4.0–10.5)
nRBC: 0 % (ref 0.0–0.2)

## 2021-10-26 LAB — TROPONIN I (HIGH SENSITIVITY): Troponin I (High Sensitivity): 4 ng/L (ref <18)

## 2021-10-26 LAB — COMPREHENSIVE METABOLIC PANEL
ALT: 76 U/L — ABNORMAL HIGH (ref 0–44)
AST: 141 U/L — ABNORMAL HIGH (ref 15–41)
Albumin: 4.1 g/dL (ref 3.5–5.0)
Alkaline Phosphatase: 102 U/L (ref 38–126)
Anion gap: 10 (ref 5–15)
BUN: 10 mg/dL (ref 6–20)
CO2: 25 mmol/L (ref 22–32)
Calcium: 9.7 mg/dL (ref 8.9–10.3)
Chloride: 111 mmol/L (ref 98–111)
Creatinine, Ser: 0.73 mg/dL (ref 0.61–1.24)
GFR, Estimated: >60 mL/min (ref >60)
Glucose, Bld: 94 mg/dL (ref 70–99)
Potassium: 4.5 mmol/L (ref 3.5–5.1)
Sodium: 146 mmol/L — ABNORMAL HIGH (ref 135–145)
Total Bilirubin: 0.4 mg/dL (ref 0.3–1.2)
Total Protein: 7.9 g/dL (ref 6.5–8.1)

## 2021-10-26 LAB — ETHANOL: Alcohol, Ethyl (B): 411 mg/dL (ref <10)

## 2021-10-26 MED ORDER — SODIUM CHLORIDE 0.9 % IV BOLUS
1000.0000 mL | Freq: Once | INTRAVENOUS | Status: AC
  Administered 2021-10-26: 1000 mL via INTRAVENOUS

## 2021-10-26 NOTE — ED Triage Notes (Signed)
Requesting alcohol detox , Reports last drink 1 hour ago . Pt had a 5th of vodka . Patient appears disheveled and intoxicated .Patient is a/o x3. O2 saturation is 87% room air. Respirations are shallow and even . No distress noted. No accessory muscle usage. Patient told to take deep breaths. Placed on 5L of 02 via nasal canulla saturating at 91%.

## 2021-10-27 ENCOUNTER — Observation Stay (HOSPITAL_COMMUNITY): Payer: Self-pay

## 2021-10-27 ENCOUNTER — Encounter (HOSPITAL_COMMUNITY): Payer: Self-pay | Admitting: Internal Medicine

## 2021-10-27 ENCOUNTER — Emergency Department (HOSPITAL_COMMUNITY): Payer: Self-pay

## 2021-10-27 DIAGNOSIS — F10939 Alcohol use, unspecified with withdrawal, unspecified: Secondary | ICD-10-CM

## 2021-10-27 DIAGNOSIS — J9601 Acute respiratory failure with hypoxia: Secondary | ICD-10-CM | POA: Diagnosis present

## 2021-10-27 DIAGNOSIS — Z720 Tobacco use: Secondary | ICD-10-CM

## 2021-10-27 DIAGNOSIS — G40909 Epilepsy, unspecified, not intractable, without status epilepticus: Secondary | ICD-10-CM

## 2021-10-27 HISTORY — DX: Tobacco use: Z72.0

## 2021-10-27 LAB — BLOOD GAS, VENOUS
Acid-Base Excess: 3.5 mmol/L — ABNORMAL HIGH (ref 0.0–2.0)
Acid-Base Excess: 4.2 mmol/L — ABNORMAL HIGH (ref 0.0–2.0)
Bicarbonate: 30.8 mmol/L — ABNORMAL HIGH (ref 20.0–28.0)
Bicarbonate: 29.8 mmol/L — ABNORMAL HIGH (ref 20.0–28.0)
O2 Saturation: 91.5 %
O2 Saturation: 74.7 %
Patient temperature: 37
Patient temperature: 36.8
pCO2, Ven: 57 mmHg (ref 44–60)
pCO2, Ven: 47 mmHg (ref 44–60)
pH, Ven: 7.34 (ref 7.25–7.43)
pH, Ven: 7.41 (ref 7.25–7.43)
pO2, Ven: 66 mmHg — ABNORMAL HIGH (ref 32–45)
pO2, Ven: 42 mmHg (ref 32–45)

## 2021-10-27 LAB — COMPREHENSIVE METABOLIC PANEL
ALT: 78 U/L — ABNORMAL HIGH (ref 0–44)
AST: 128 U/L — ABNORMAL HIGH (ref 15–41)
Albumin: 4 g/dL (ref 3.5–5.0)
Alkaline Phosphatase: 98 U/L (ref 38–126)
Anion gap: 9 (ref 5–15)
BUN: 10 mg/dL (ref 6–20)
CO2: 25 mmol/L (ref 22–32)
Calcium: 9.8 mg/dL (ref 8.9–10.3)
Chloride: 107 mmol/L (ref 98–111)
Creatinine, Ser: 0.56 mg/dL — ABNORMAL LOW (ref 0.61–1.24)
GFR, Estimated: >60 mL/min (ref >60)
Glucose, Bld: 89 mg/dL (ref 70–99)
Potassium: 4.3 mmol/L (ref 3.5–5.1)
Sodium: 141 mmol/L (ref 135–145)
Total Bilirubin: 0.6 mg/dL (ref 0.3–1.2)
Total Protein: 7.9 g/dL (ref 6.5–8.1)

## 2021-10-27 LAB — CBC WITH DIFFERENTIAL/PLATELET
Abs Immature Granulocytes: 0.02 10*3/uL (ref 0.00–0.07)
Basophils Absolute: 0.1 10*3/uL (ref 0.0–0.1)
Basophils Relative: 2 %
Eosinophils Absolute: 0.2 10*3/uL (ref 0.0–0.5)
Eosinophils Relative: 4 %
HCT: 43.2 % (ref 39.0–52.0)
Hemoglobin: 14.3 g/dL (ref 13.0–17.0)
Immature Granulocytes: 0 %
Lymphocytes Relative: 13 %
Lymphs Abs: 0.7 10*3/uL (ref 0.7–4.0)
MCH: 33.6 pg (ref 26.0–34.0)
MCHC: 33.1 g/dL (ref 30.0–36.0)
MCV: 101.4 fL — ABNORMAL HIGH (ref 80.0–100.0)
Monocytes Absolute: 0.4 10*3/uL (ref 0.1–1.0)
Monocytes Relative: 7 %
Neutro Abs: 3.9 10*3/uL (ref 1.7–7.7)
Neutrophils Relative %: 74 %
Platelets: 164 10*3/uL (ref 150–400)
RBC: 4.26 MIL/uL (ref 4.22–5.81)
RDW: 15.9 % — ABNORMAL HIGH (ref 11.5–15.5)
WBC: 5.2 10*3/uL (ref 4.0–10.5)
nRBC: 0 % (ref 0.0–0.2)

## 2021-10-27 LAB — HIV ANTIBODY (ROUTINE TESTING W REFLEX): HIV Screen 4th Generation wRfx: NONREACTIVE

## 2021-10-27 LAB — URINALYSIS, ROUTINE W REFLEX MICROSCOPIC
Bacteria, UA: NONE SEEN
Bilirubin Urine: NEGATIVE
Glucose, UA: NEGATIVE mg/dL
Ketones, ur: 5 mg/dL — AB
Leukocytes,Ua: NEGATIVE
Nitrite: NEGATIVE
Protein, ur: 100 mg/dL — AB
Specific Gravity, Urine: 1.026 (ref 1.005–1.030)
pH: 5 (ref 5.0–8.0)

## 2021-10-27 LAB — MAGNESIUM: Magnesium: 1.9 mg/dL (ref 1.7–2.4)

## 2021-10-27 LAB — HEPATITIS PANEL, ACUTE
HCV Ab: NONREACTIVE
Hep A IgM: NONREACTIVE
Hep B C IgM: NONREACTIVE
Hepatitis B Surface Ag: NONREACTIVE

## 2021-10-27 LAB — MRSA NEXT GEN BY PCR, NASAL: MRSA by PCR Next Gen: NOT DETECTED

## 2021-10-27 LAB — PROCALCITONIN: Procalcitonin: <0.1 ng/mL

## 2021-10-27 LAB — PHOSPHORUS: Phosphorus: 3.3 mg/dL (ref 2.5–4.6)

## 2021-10-27 LAB — TROPONIN I (HIGH SENSITIVITY): Troponin I (High Sensitivity): 5 ng/L (ref <18)

## 2021-10-27 MED ORDER — IOHEXOL 350 MG/ML SOLN
100.0000 mL | Freq: Once | INTRAVENOUS | Status: AC | PRN
  Administered 2021-10-27: 100 mL via INTRAVENOUS

## 2021-10-27 MED ORDER — DOXYCYCLINE HYCLATE 100 MG PO TABS
100.0000 mg | ORAL_TABLET | Freq: Two times a day (BID) | ORAL | Status: DC
  Administered 2021-10-27 – 2021-10-30 (×7): 100 mg via ORAL
  Filled 2021-10-27 (×7): qty 1

## 2021-10-27 MED ORDER — ACETAMINOPHEN 325 MG PO TABS
650.0000 mg | ORAL_TABLET | Freq: Four times a day (QID) | ORAL | Status: DC | PRN
  Administered 2021-10-28: 650 mg via ORAL
  Filled 2021-10-27: qty 2

## 2021-10-27 MED ORDER — METHYLPREDNISOLONE SODIUM SUCC 125 MG IJ SOLR
125.0000 mg | Freq: Once | INTRAMUSCULAR | Status: AC
  Administered 2021-10-27: 125 mg via INTRAVENOUS
  Filled 2021-10-27: qty 2

## 2021-10-27 MED ORDER — THIAMINE HCL 100 MG/ML IJ SOLN: 100.0000 mg | Freq: Every day | INTRAMUSCULAR | Status: DC

## 2021-10-27 MED ORDER — LACTATED RINGERS IV SOLN: Freq: Once | INTRAVENOUS | Status: AC

## 2021-10-27 MED ORDER — LORAZEPAM 2 MG/ML IJ SOLN: 0.0000 mg | Freq: Four times a day (QID) | INTRAMUSCULAR | Status: AC

## 2021-10-27 MED ORDER — ACETYLCYSTEINE 20 % IN SOLN
3.0000 mL | Freq: Three times a day (TID) | RESPIRATORY_TRACT | Status: DC
Start: 2021-10-27 — End: 2021-10-28
  Administered 2021-10-27 – 2021-10-28 (×3): 3 mL via RESPIRATORY_TRACT
  Filled 2021-10-27 (×3): qty 4

## 2021-10-27 MED ORDER — LORAZEPAM 1 MG PO TABS
0.0000 mg | ORAL_TABLET | Freq: Four times a day (QID) | ORAL | Status: AC
  Administered 2021-10-27 – 2021-10-28 (×5): 1 mg via ORAL
  Administered 2021-10-28: 2 mg via ORAL
  Filled 2021-10-27: qty 2
  Filled 2021-10-27 (×5): qty 1

## 2021-10-27 MED ORDER — CHLORHEXIDINE GLUCONATE CLOTH 2 % EX PADS
6.0000 | MEDICATED_PAD | Freq: Every day | CUTANEOUS | Status: DC
  Administered 2021-10-27 – 2021-10-30 (×3): 6 via TOPICAL

## 2021-10-27 MED ORDER — FOLIC ACID 1 MG PO TABS
1.0000 mg | ORAL_TABLET | Freq: Every day | ORAL | Status: DC
  Administered 2021-10-27 – 2021-10-30 (×4): 1 mg via ORAL
  Filled 2021-10-27 (×4): qty 1

## 2021-10-27 MED ORDER — ACETAMINOPHEN 650 MG RE SUPP: 650.0000 mg | Freq: Four times a day (QID) | RECTAL | Status: DC | PRN

## 2021-10-27 MED ORDER — ORAL CARE MOUTH RINSE: 15.0000 mL | OROMUCOSAL | Status: DC | PRN

## 2021-10-27 MED ORDER — IPRATROPIUM-ALBUTEROL 0.5-2.5 (3) MG/3ML IN SOLN
3.0000 mL | Freq: Three times a day (TID) | RESPIRATORY_TRACT | Status: DC
  Administered 2021-10-28 – 2021-10-30 (×6): 3 mL via RESPIRATORY_TRACT
  Filled 2021-10-27 (×8): qty 3

## 2021-10-27 MED ORDER — THIAMINE HCL 100 MG PO TABS
100.0000 mg | ORAL_TABLET | Freq: Every day | ORAL | Status: DC
  Administered 2021-10-27 – 2021-10-30 (×4): 100 mg via ORAL
  Filled 2021-10-27 (×4): qty 1

## 2021-10-27 MED ORDER — LORAZEPAM 2 MG/ML IJ SOLN: 0.0000 mg | Freq: Two times a day (BID) | INTRAMUSCULAR | Status: DC

## 2021-10-27 MED ORDER — PREDNISONE 20 MG PO TABS
40.0000 mg | ORAL_TABLET | Freq: Every day | ORAL | Status: DC
  Administered 2021-10-28 – 2021-10-30 (×3): 40 mg via ORAL
  Filled 2021-10-27 (×3): qty 2

## 2021-10-27 MED ORDER — ENOXAPARIN SODIUM 40 MG/0.4ML IJ SOSY
40.0000 mg | PREFILLED_SYRINGE | INTRAMUSCULAR | Status: DC
  Administered 2021-10-27 – 2021-10-30 (×4): 40 mg via SUBCUTANEOUS
  Filled 2021-10-27 (×4): qty 0.4

## 2021-10-27 MED ORDER — IPRATROPIUM-ALBUTEROL 0.5-2.5 (3) MG/3ML IN SOLN
3.0000 mL | Freq: Four times a day (QID) | RESPIRATORY_TRACT | Status: DC
  Administered 2021-10-27 (×3): 3 mL via RESPIRATORY_TRACT
  Filled 2021-10-27 (×3): qty 3

## 2021-10-27 MED ORDER — LORAZEPAM 2 MG/ML IJ SOLN
1.0000 mg | INTRAMUSCULAR | Status: AC | PRN
  Administered 2021-10-27: 1 mg via INTRAVENOUS
  Filled 2021-10-27: qty 1

## 2021-10-27 MED ORDER — ADULT MULTIVITAMIN W/MINERALS CH
1.0000 | ORAL_TABLET | Freq: Every day | ORAL | Status: DC
  Administered 2021-10-27 – 2021-10-29 (×3): 1 via ORAL
  Filled 2021-10-27 (×3): qty 1

## 2021-10-27 MED ORDER — LORAZEPAM 1 MG PO TABS
0.0000 mg | ORAL_TABLET | Freq: Two times a day (BID) | ORAL | Status: DC
  Administered 2021-10-29: 2 mg via ORAL
  Filled 2021-10-27: qty 2

## 2021-10-27 MED ORDER — LORAZEPAM 1 MG PO TABS
1.0000 mg | ORAL_TABLET | ORAL | Status: AC | PRN
  Administered 2021-10-29: 2 mg via ORAL
  Filled 2021-10-27: qty 2

## 2021-10-27 MED ORDER — GUAIFENESIN ER 600 MG PO TB12
1200.0000 mg | ORAL_TABLET | Freq: Two times a day (BID) | ORAL | Status: DC
Start: 2021-10-27 — End: 2021-10-30
  Administered 2021-10-27 – 2021-10-30 (×7): 1200 mg via ORAL
  Filled 2021-10-27 (×7): qty 2

## 2021-10-27 MED ORDER — LEVETIRACETAM 500 MG PO TABS
500.0000 mg | ORAL_TABLET | Freq: Two times a day (BID) | ORAL | Status: DC
  Administered 2021-10-27 – 2021-10-30 (×7): 500 mg via ORAL
  Filled 2021-10-27 (×7): qty 1

## 2021-10-27 NOTE — ED Notes (Signed)
Pt given 282mL's of water.

## 2021-10-27 NOTE — H&P (Addendum)
History and Physical    Patient: Gary Mclaughlin KGM:[id number] DOB: 1970-08-06 DOA: 10/26/2021 DOS: the patient was seen and examined on 10/27/2021 PCP: Pcp, No  Patient coming from: Home  Chief Complaint:  Chief Complaint  Patient presents with   Requesting alcohol detox   HPI: Gary Mclaughlin is a 51 y.o. male with medical history significant of EtOH abuse, TBI, seizure d/o, tobacco abuse. Presenting with concern for EtOH abuse. He states that he wants to detox. He says he drinks "as much as I can get my hands on". I reports drinking a fifth of liquor and three 40oz beers yesterday. He states he is starting to feel palpitations and shakiness. Of note, he c/o cough, but denies dyspnea. Denies fevers. He denies any other aggravating or alleviating factors.   Review of Systems: As mentioned in the history of present illness. All other systems reviewed and are negative. Past Medical History:  Diagnosis Date   Depression    ETOH abuse    PMHx No past surgical history on file.  Social History:  He has a 25.00 pack-year smoking history. He has never used smokeless tobacco. He reports that he does not currently use alcohol. He reports that he does not use drugs.  No Known Allergies  Family History  Family history unknown: Yes    Prior to Admission medications   Medication Sig Start Date End Date Taking? Authorizing Provider  levETIRAcetam (KEPPRA) 500 MG tablet Take 1 tablet (500 mg total) by mouth 2 (two) times daily. Patient not taking: Reported on 10/27/2021 09/24/21 12/23/21  Ernie Avena, MD    Physical Exam: Vitals:   10/27/21 0344 10/27/21 0432 10/27/21 0700 10/27/21 0703  BP:  117/74 117/75 117/75  Pulse: 69 81 64 71  Resp:  16 14 13   Temp:      TempSrc:      SpO2: 95% 96% 97% 94%   General: 51 y.o. male resting in bed in NAD Eyes: PERRL, normal sclera ENMT: Nares patent w/o discharge, orophaynx clear, dentition normal, ears w/o discharge/lesions/ulcers Neck: Supple,  trachea midline Cardiovascular: RRR, +S1, S2, no m/g/r, equal pulses throughout Respiratory: diffuse insp/exp wheeze, no r/r, increased WOB on 5L [place] GI: BS+, NDNT, no masses noted, no organomegaly noted MSK: No e/c/c Neuro: A&O x 3, no focal deficits Psyc: Appropriate interaction but flat affect, calm/cooperative  Data Reviewed:  Na+  146 K+  4.5 Glucose  94 BUN  10 Scr  0.73 AST  141 ALT  76 Trp  4 -> 5 WBC  4.3 Hgb  14.2 MCV  100.7 Plts  178 EtOH lvl  411  pH  7.34 pCO2  57 SpO2  88%  CTA chest 1. No pulmonary embolism. 2. Bronchial wall thickening in keeping with airway inflammation with superimposed layering debris and airway impaction within the left lower lobar pulmonary bronchus. No central obstructing mass lesion. 3. Severe hepatic steatosis.  Assessment and Plan: EtOH abuse EtOH withdrawal     - placed in obs, SDU     - continue CIWA, thiamine, MVI, folate, fluids     - counsel against further use     - consider psyc consult as hypoxia resolves  Acute respiratory failure w/ hypoxia     - doesn't carry a formal COPD diagnosis, isn't on chronic O2 or inhalers     - CTA chest as above; ? Mucous plugging? Spoke with PCCM     - will start mucomyst, nebs, steroids, doxy     - check  procal  Seizure d/o     - recent ED visit for seizure activity; was placed on keppra with recommendation to follow up with neurology; he hasn't followed up with neuro     - resume keppra  Tobacco abuse     - counsel against further use  Advance Care Planning:   Code Status: Full Code  Consults: Sidebarred PCCM  Family Communication: None at bedside  Severity of Illness: The appropriate patient status for this patient is OBSERVATION. Observation status is judged to be reasonable and necessary in order to provide the required intensity of service to ensure the patient's safety. The patient's presenting symptoms, physical exam findings, and initial radiographic and laboratory  data in the context of their medical condition is felt to place them at decreased risk for further clinical deterioration. Furthermore, it is anticipated that the patient will be medically stable for discharge from the hospital within 2 midnights of admission.   Author: Teddy Spike, DO 10/27/2021 7:14 AM  For on call review [email or web address].

## 2021-10-28 DIAGNOSIS — Z720 Tobacco use: Secondary | ICD-10-CM

## 2021-10-28 DIAGNOSIS — G40909 Epilepsy, unspecified, not intractable, without status epilepticus: Secondary | ICD-10-CM

## 2021-10-28 DIAGNOSIS — F10939 Alcohol use, unspecified with withdrawal, unspecified: Secondary | ICD-10-CM

## 2021-10-28 LAB — COMPREHENSIVE METABOLIC PANEL
ALT: 55 U/L — ABNORMAL HIGH (ref 0–44)
AST: 61 U/L — ABNORMAL HIGH (ref 15–41)
Albumin: 3.4 g/dL — ABNORMAL LOW (ref 3.5–5.0)
Alkaline Phosphatase: 84 U/L (ref 38–126)
Anion gap: 8 (ref 5–15)
BUN: 16 mg/dL (ref 6–20)
CO2: 27 mmol/L (ref 22–32)
Calcium: 9.6 mg/dL (ref 8.9–10.3)
Chloride: 102 mmol/L (ref 98–111)
Creatinine, Ser: 0.63 mg/dL (ref 0.61–1.24)
GFR, Estimated: >60 mL/min (ref >60)
Glucose, Bld: 130 mg/dL — ABNORMAL HIGH (ref 70–99)
Potassium: 4.1 mmol/L (ref 3.5–5.1)
Sodium: 137 mmol/L (ref 135–145)
Total Bilirubin: 0.7 mg/dL (ref 0.3–1.2)
Total Protein: 6.9 g/dL (ref 6.5–8.1)

## 2021-10-28 LAB — CBC
HCT: 40.7 % (ref 39.0–52.0)
Hemoglobin: 13.4 g/dL (ref 13.0–17.0)
MCH: 32.8 pg (ref 26.0–34.0)
MCHC: 32.9 g/dL (ref 30.0–36.0)
MCV: 99.8 fL (ref 80.0–100.0)
Platelets: 149 10*3/uL — ABNORMAL LOW (ref 150–400)
RBC: 4.08 MIL/uL — ABNORMAL LOW (ref 4.22–5.81)
RDW: 15 % (ref 11.5–15.5)
WBC: 5.2 10*3/uL (ref 4.0–10.5)
nRBC: 0 % (ref 0.0–0.2)

## 2021-10-28 LAB — RAPID URINE DRUG SCREEN, HOSP PERFORMED
Amphetamines: NOT DETECTED
Barbiturates: NOT DETECTED
Benzodiazepines: POSITIVE — AB
Cocaine: NOT DETECTED
Opiates: NOT DETECTED
Tetrahydrocannabinol: NOT DETECTED

## 2021-10-28 MED ORDER — NICOTINE 21 MG/24HR TD PT24
21.0000 mg | MEDICATED_PATCH | TRANSDERMAL | Status: DC
  Administered 2021-10-28 – 2021-10-29 (×2): 21 mg via TRANSDERMAL
  Filled 2021-10-28 (×2): qty 1

## 2021-10-28 NOTE — TOC Initial Note (Signed)
Transition of Care [facility]) - Initial/Assessment Note    Patient Details  Name: Jacorien Siple MRN: [id number] Date of Birth: Dec 08, 1970  Transition of Care [facility]) CM/SW Contact:    Golda Acre, RN Phone Number: 10/28/2021, 9:28 AM  Clinical Narrative:                  Transition of Care [facility]) Screening Note   Patient Details  Name: Jabraylon Bodnar Date of Birth: 1970/09/22   Transition of Care [facility]) CM/SW Contact:    Golda Acre, RN Phone Number: 10/28/2021, 9:29 AM    Transition of Care Department [facility]) has reviewed patient and no TOC needs have been identified at this time. We will continue to monitor patient advancement through interdisciplinary progression rounds. If new patient transition needs arise, please place a TOC consult.    Expected Discharge Plan: Home/Self Care Barriers to Discharge: Continued Medical Work up   Patient Goals and CMS Choice Patient states their goals for this hospitalization and ongoing recovery are:: to go home CMS Medicare.gov Compare Post Acute Care list provided to:: Patient Choice offered to / list presented to : Patient  Expected Discharge Plan and Services Expected Discharge Plan: Home/Self Care   Discharge Planning Services: CM Consult   Living arrangements for the past 2 months: Single Family Home                                      Prior Living Arrangements/Services Living arrangements for the past 2 months: Single Family Home Lives with:: Self Patient language and need for interpreter reviewed:: Yes Do you feel safe going back to the place where you live?: Yes            Criminal Activity/Legal Involvement Pertinent to Current Situation/Hospitalization: No - Comment as needed  Activities of Daily Living Home Assistive Devices/Equipment: None ADL Screening (condition at time of admission) Patient's cognitive ability adequate to safely complete daily activities?: Yes Is the patient deaf or have  difficulty hearing?: No Does the patient have difficulty seeing, even when wearing glasses/contacts?: No Does the patient have difficulty concentrating, remembering, or making decisions?: Yes Patient able to express need for assistance with ADLs?: Yes Does the patient have difficulty dressing or bathing?: No Independently performs ADLs?: Yes (appropriate for developmental age) Does the patient have difficulty walking or climbing stairs?: No Weakness of Legs: None Weakness of Arms/Hands: None  Permission Sought/Granted                  Emotional Assessment Appearance:: Appears stated age     Orientation: : Oriented to Self, Oriented to Place, Oriented to  Time, Oriented to Situation Alcohol / Substance Use: Not Applicable Psych Involvement: No (comment)  Admission diagnosis:  Acute respiratory failure with hypoxia (HCC) [J96.01] Alcoholic intoxication without complication (HCC) [F10.920] Patient Active Problem List   Diagnosis Date Noted   Acute respiratory failure with hypoxia (HCC) 10/27/2021   Alcohol withdrawal (HCC) 10/27/2021   Seizure disorder (HCC) 10/27/2021   Tobacco abuse 10/27/2021   Subarachnoid hemorrhage (HCC) 07/28/2017   SAH (subarachnoid hemorrhage) (HCC) 07/27/2017   Alcohol intoxication (HCC) 07/27/2017   Normocytic normochromic anemia 07/27/2017   Alcohol dependence (HCC) 05/01/2014   PCP:  Oneita Hurt, No Pharmacy:   [facility] [address], [address] [place] [address] Phone: [phone] Fax: [phone]     Social Determinants of  Health (SDOH) Interventions    Readmission Risk Interventions     No data to display

## 2021-10-29 LAB — COMPREHENSIVE METABOLIC PANEL
ALT: 53 U/L — ABNORMAL HIGH (ref 0–44)
AST: 61 U/L — ABNORMAL HIGH (ref 15–41)
Albumin: 3.3 g/dL — ABNORMAL LOW (ref 3.5–5.0)
Alkaline Phosphatase: 79 U/L (ref 38–126)
Anion gap: 10 (ref 5–15)
BUN: 17 mg/dL (ref 6–20)
CO2: 26 mmol/L (ref 22–32)
Calcium: 10 mg/dL (ref 8.9–10.3)
Chloride: 101 mmol/L (ref 98–111)
Creatinine, Ser: 0.69 mg/dL (ref 0.61–1.24)
GFR, Estimated: >60 mL/min (ref >60)
Glucose, Bld: 104 mg/dL — ABNORMAL HIGH (ref 70–99)
Potassium: 3.6 mmol/L (ref 3.5–5.1)
Sodium: 137 mmol/L (ref 135–145)
Total Bilirubin: 0.6 mg/dL (ref 0.3–1.2)
Total Protein: 6.8 g/dL (ref 6.5–8.1)

## 2021-10-29 LAB — CBC WITH DIFFERENTIAL/PLATELET
Abs Immature Granulocytes: 0.02 10*3/uL (ref 0.00–0.07)
Basophils Absolute: 0 10*3/uL (ref 0.0–0.1)
Basophils Relative: 1 %
Eosinophils Absolute: 0.1 10*3/uL (ref 0.0–0.5)
Eosinophils Relative: 2 %
HCT: 40.7 % (ref 39.0–52.0)
Hemoglobin: 13.6 g/dL (ref 13.0–17.0)
Immature Granulocytes: 0 %
Lymphocytes Relative: 18 %
Lymphs Abs: 1.2 10*3/uL (ref 0.7–4.0)
MCH: 33.2 pg (ref 26.0–34.0)
MCHC: 33.4 g/dL (ref 30.0–36.0)
MCV: 99.3 fL (ref 80.0–100.0)
Monocytes Absolute: 0.4 10*3/uL (ref 0.1–1.0)
Monocytes Relative: 6 %
Neutro Abs: 4.7 10*3/uL (ref 1.7–7.7)
Neutrophils Relative %: 73 %
Platelets: 145 10*3/uL — ABNORMAL LOW (ref 150–400)
RBC: 4.1 MIL/uL — ABNORMAL LOW (ref 4.22–5.81)
RDW: 14.7 % (ref 11.5–15.5)
WBC: 6.5 10*3/uL (ref 4.0–10.5)
nRBC: 0 % (ref 0.0–0.2)

## 2021-10-29 MED ORDER — LORAZEPAM 1 MG PO TABS
0.0000 mg | ORAL_TABLET | Freq: Two times a day (BID) | ORAL | Status: DC
  Administered 2021-10-29 – 2021-10-30 (×2): 2 mg via ORAL
  Filled 2021-10-29 (×2): qty 2

## 2021-10-29 MED ORDER — LORAZEPAM 2 MG/ML IJ SOLN: 0.0000 mg | Freq: Two times a day (BID) | INTRAMUSCULAR | Status: DC

## 2021-10-30 ENCOUNTER — Other Ambulatory Visit (HOSPITAL_COMMUNITY): Payer: Self-pay

## 2021-10-30 DIAGNOSIS — K760 Fatty (change of) liver, not elsewhere classified: Secondary | ICD-10-CM

## 2021-10-30 DIAGNOSIS — R7401 Elevation of levels of liver transaminase levels: Secondary | ICD-10-CM

## 2021-10-30 HISTORY — DX: Fatty (change of) liver, not elsewhere classified: K76.0

## 2021-10-30 LAB — CBC WITH DIFFERENTIAL/PLATELET
Abs Immature Granulocytes: 0.04 10*3/uL (ref 0.00–0.07)
Basophils Absolute: 0.1 10*3/uL (ref 0.0–0.1)
Basophils Relative: 1 %
Eosinophils Absolute: 0.1 10*3/uL (ref 0.0–0.5)
Eosinophils Relative: 2 %
HCT: 41.4 % (ref 39.0–52.0)
Hemoglobin: 14.2 g/dL (ref 13.0–17.0)
Immature Granulocytes: 1 %
Lymphocytes Relative: 25 %
Lymphs Abs: 1.5 10*3/uL (ref 0.7–4.0)
MCH: 34.4 pg — ABNORMAL HIGH (ref 26.0–34.0)
MCHC: 34.3 g/dL (ref 30.0–36.0)
MCV: 100.2 fL — ABNORMAL HIGH (ref 80.0–100.0)
Monocytes Absolute: 0.4 10*3/uL (ref 0.1–1.0)
Monocytes Relative: 7 %
Neutro Abs: 3.8 10*3/uL (ref 1.7–7.7)
Neutrophils Relative %: 64 %
Platelets: 148 10*3/uL — ABNORMAL LOW (ref 150–400)
RBC: 4.13 MIL/uL — ABNORMAL LOW (ref 4.22–5.81)
RDW: 14.6 % (ref 11.5–15.5)
WBC: 6 10*3/uL (ref 4.0–10.5)
nRBC: 0 % (ref 0.0–0.2)

## 2021-10-30 LAB — COMPREHENSIVE METABOLIC PANEL
ALT: 58 U/L — ABNORMAL HIGH (ref 0–44)
AST: 61 U/L — ABNORMAL HIGH (ref 15–41)
Albumin: 3.6 g/dL (ref 3.5–5.0)
Alkaline Phosphatase: 79 U/L (ref 38–126)
Anion gap: 8 (ref 5–15)
BUN: 15 mg/dL (ref 6–20)
CO2: 29 mmol/L (ref 22–32)
Calcium: 10.2 mg/dL (ref 8.9–10.3)
Chloride: 105 mmol/L (ref 98–111)
Creatinine, Ser: 0.73 mg/dL (ref 0.61–1.24)
GFR, Estimated: >60 mL/min (ref >60)
Glucose, Bld: 99 mg/dL (ref 70–99)
Potassium: 3.9 mmol/L (ref 3.5–5.1)
Sodium: 142 mmol/L (ref 135–145)
Total Bilirubin: 0.8 mg/dL (ref 0.3–1.2)
Total Protein: 7.2 g/dL (ref 6.5–8.1)

## 2021-10-30 MED ORDER — ADULT MULTIVITAMIN W/MINERALS CH
1.0000 | ORAL_TABLET | Freq: Every day | ORAL | 0 refills | Status: AC
Start: 2021-10-30 — End: 2021-11-29
  Filled 2021-10-30: qty 30, 30d supply, fill #0

## 2021-10-30 MED ORDER — THIAMINE HCL 100 MG PO TABS
100.0000 mg | ORAL_TABLET | Freq: Every day | ORAL | 0 refills | Status: AC
  Filled 2021-10-30: qty 30, 30d supply, fill #0

## 2021-10-30 MED ORDER — PREDNISONE 20 MG PO TABS
20.0000 mg | ORAL_TABLET | Freq: Every day | ORAL | 0 refills | Status: AC
  Filled 2021-10-30: qty 5, 5d supply, fill #0

## 2021-10-30 MED ORDER — FOLIC ACID 1 MG PO TABS
1.0000 mg | ORAL_TABLET | Freq: Every day | ORAL | 0 refills | Status: AC
  Filled 2021-10-30: qty 30, 30d supply, fill #0

## 2021-10-30 MED ORDER — ALBUTEROL SULFATE HFA 108 (90 BASE) MCG/ACT IN AERS
2.0000 | INHALATION_SPRAY | Freq: Four times a day (QID) | RESPIRATORY_TRACT | 2 refills | Status: DC | PRN
  Filled 2021-10-30: qty 8.5, 25d supply, fill #0

## 2021-10-30 MED ORDER — DOXYCYCLINE HYCLATE 100 MG PO TABS
100.0000 mg | ORAL_TABLET | Freq: Two times a day (BID) | ORAL | 0 refills | Status: AC
  Filled 2021-10-30: qty 10, 5d supply, fill #0

## 2021-10-30 MED ORDER — LEVETIRACETAM 500 MG PO TABS
500.0000 mg | ORAL_TABLET | Freq: Two times a day (BID) | ORAL | 0 refills | Status: DC
  Filled 2021-10-30: qty 60, 30d supply, fill #0

## 2021-10-30 NOTE — TOC Transition Note (Signed)
Transition of Care [facility]) - CM/SW Discharge Note   Patient Details  Name: Kenly Xiao MRN: [id number] Date of Birth: Jan 05, 1971  [facility] CM/SW Contact:  Golda Acre, RN Phone Number: 10/30/2021, 10:15 AM   Clinical Narrative:    Resources for homeless shelters and substance abuse given to patient.   Final next level of care: Homeless Shelter Barriers to Discharge: Barriers Resolved   Patient Goals and CMS Choice Patient states their goals for this hospitalization and ongoing recovery are:: to go home CMS Medicare.gov Compare Post Acute Care list provided to:: Patient Choice offered to / list presented to : Patient  Discharge Placement                       Discharge Plan and Services   Discharge Planning Services: CM Consult                                 Social Determinants of Health (SDOH) Interventions     Readmission Risk Interventions     No data to display

## 2021-10-30 NOTE — Hospital Course (Addendum)
51 y.o.m w/ multiple medical history including EtOH abuse, TBI, seizure d/o, tobacco abuse. Who presented with concern for EtOH abuse. Noted to be hypoxic to the mid 80s and requiring 5 L supplemental oxygen. CTA negative for PE but showing findings concerning for possible mucous plug (bronchial wall thickening/inflammation and airway impaction within the left lower lobe pulmonary bronchus).  Patient was placed on CIWA protocol, PCCM consulted-discussed by admitting physician who recommended mucomyst, nebs, steroids, IS, doxy. 6/28-overnight on room air afebrile, labs stable CMP with stable/slightly downtrending AST ALT 61/58-on admission 128/78.  Last CIWA score 06/27 Patient has been ambulatory doing overall well off oxygen not wheezing anymore.  He feels improved and feels ready for discharge back to his previous living arrangement. Prescription sent to [facility]. ,TOC consulted to help with medication, discussed w/ outpatient pharmacist as well TOC consulted to help with his homelessness status/outpatient resources Shelter arrangement.  Patient discharged in medically stable condition
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
## 2021-11-18 ENCOUNTER — Other Ambulatory Visit: Payer: Self-pay

## 2021-11-18 ENCOUNTER — Emergency Department (HOSPITAL_COMMUNITY)
Admission: EM | Admit: 2021-11-18 | Discharge: 2021-11-18 | Disposition: A | Discharge status: Home / Self Care | Payer: Self-pay | Attending: Emergency Medicine | Admitting: Emergency Medicine

## 2021-11-18 ENCOUNTER — Encounter (HOSPITAL_COMMUNITY): Payer: Self-pay

## 2021-11-18 DIAGNOSIS — Z7141 Alcohol abuse counseling and surveillance of alcoholic: Secondary | ICD-10-CM | POA: Insufficient documentation

## 2021-11-18 DIAGNOSIS — Z5321 Procedure and treatment not carried out due to patient leaving prior to being seen by health care provider: Secondary | ICD-10-CM | POA: Insufficient documentation

## 2021-11-18 MED ORDER — NICOTINE 21 MG/24HR TD PT24: 21.0000 mg | MEDICATED_PATCH | Freq: Once | TRANSDERMAL | Status: DC

## 2021-11-18 NOTE — ED Triage Notes (Signed)
Per EMS- patient is requesting detox from alcohol

## 2021-11-18 NOTE — ED Triage Notes (Signed)
Unable to complete  triage due to being intoxicated.

## 2021-11-19 ENCOUNTER — Encounter (HOSPITAL_COMMUNITY): Payer: Self-pay | Admitting: Emergency Medicine

## 2021-11-19 ENCOUNTER — Emergency Department (HOSPITAL_COMMUNITY)
Admission: EM | Admit: 2021-11-19 | Discharge: 2021-11-19 | Discharge status: Against Medical Advice | Payer: Self-pay | Attending: Emergency Medicine | Admitting: Emergency Medicine

## 2021-11-19 ENCOUNTER — Other Ambulatory Visit: Payer: Self-pay

## 2021-11-19 DIAGNOSIS — Y908 Blood alcohol level of 240 mg/100 ml or more: Secondary | ICD-10-CM | POA: Insufficient documentation

## 2021-11-19 DIAGNOSIS — Z5321 Procedure and treatment not carried out due to patient leaving prior to being seen by health care provider: Secondary | ICD-10-CM | POA: Insufficient documentation

## 2021-11-19 DIAGNOSIS — F10920 Alcohol use, unspecified with intoxication, uncomplicated: Secondary | ICD-10-CM | POA: Insufficient documentation

## 2021-11-19 LAB — CBC
HCT: 41.3 % (ref 39.0–52.0)
Hemoglobin: 13.8 g/dL (ref 13.0–17.0)
MCH: 33.3 pg (ref 26.0–34.0)
MCHC: 33.4 g/dL (ref 30.0–36.0)
MCV: 99.8 fL (ref 80.0–100.0)
Platelets: 139 10*3/uL — ABNORMAL LOW (ref 150–400)
RBC: 4.14 MIL/uL — ABNORMAL LOW (ref 4.22–5.81)
RDW: 13.8 % (ref 11.5–15.5)
WBC: 3.3 10*3/uL — ABNORMAL LOW (ref 4.0–10.5)
nRBC: 0 % (ref 0.0–0.2)

## 2021-11-19 LAB — ETHANOL: Alcohol, Ethyl (B): 405 mg/dL (ref <10)

## 2021-11-19 LAB — COMPREHENSIVE METABOLIC PANEL
ALT: 116 U/L — ABNORMAL HIGH (ref 0–44)
AST: 200 U/L — ABNORMAL HIGH (ref 15–41)
Albumin: 3.7 g/dL (ref 3.5–5.0)
Alkaline Phosphatase: 96 U/L (ref 38–126)
Anion gap: 14 (ref 5–15)
BUN: 9 mg/dL (ref 6–20)
CO2: 26 mmol/L (ref 22–32)
Calcium: 9.7 mg/dL (ref 8.9–10.3)
Chloride: 103 mmol/L (ref 98–111)
Creatinine, Ser: 0.74 mg/dL (ref 0.61–1.24)
GFR, Estimated: >60 mL/min (ref >60)
Glucose, Bld: 111 mg/dL — ABNORMAL HIGH (ref 70–99)
Potassium: 3.9 mmol/L (ref 3.5–5.1)
Sodium: 143 mmol/L (ref 135–145)
Total Bilirubin: 0.4 mg/dL (ref 0.3–1.2)
Total Protein: 7.1 g/dL (ref 6.5–8.1)

## 2021-11-19 NOTE — ED Notes (Signed)
No answer x2 
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
## 2021-11-19 NOTE — ED Notes (Signed)
Called for pt and no answer 

## 2021-11-19 NOTE — ED Notes (Addendum)
Ethanol 405 reported to MD Rubin Payor

## 2021-11-19 NOTE — ED Notes (Signed)
No answer x3

## 2021-12-27 ENCOUNTER — Other Ambulatory Visit: Payer: Self-pay

## 2021-12-27 ENCOUNTER — Emergency Department (HOSPITAL_COMMUNITY): Payer: Self-pay

## 2021-12-27 ENCOUNTER — Inpatient Hospital Stay (HOSPITAL_COMMUNITY)
Admission: EM | Admit: 2021-12-27 | Discharge: 2021-12-29 | DRG: 894 | Discharge status: Against Medical Advice | Payer: Self-pay | Attending: Internal Medicine | Admitting: Internal Medicine

## 2021-12-27 ENCOUNTER — Encounter (HOSPITAL_COMMUNITY): Payer: Self-pay

## 2021-12-27 DIAGNOSIS — R079 Chest pain, unspecified: Secondary | ICD-10-CM

## 2021-12-27 DIAGNOSIS — Z79899 Other long term (current) drug therapy: Secondary | ICD-10-CM

## 2021-12-27 DIAGNOSIS — Z8249 Family history of ischemic heart disease and other diseases of the circulatory system: Secondary | ICD-10-CM

## 2021-12-27 DIAGNOSIS — Z91148 Patient's other noncompliance with medication regimen for other reason: Secondary | ICD-10-CM

## 2021-12-27 DIAGNOSIS — Z5329 Procedure and treatment not carried out because of patient's decision for other reasons: Secondary | ICD-10-CM | POA: Diagnosis not present

## 2021-12-27 DIAGNOSIS — F17210 Nicotine dependence, cigarettes, uncomplicated: Secondary | ICD-10-CM | POA: Diagnosis present

## 2021-12-27 DIAGNOSIS — E872 Acidosis, unspecified: Secondary | ICD-10-CM | POA: Diagnosis present

## 2021-12-27 DIAGNOSIS — F10929 Alcohol use, unspecified with intoxication, unspecified: Secondary | ICD-10-CM | POA: Diagnosis present

## 2021-12-27 DIAGNOSIS — J441 Chronic obstructive pulmonary disease with (acute) exacerbation: Secondary | ICD-10-CM

## 2021-12-27 DIAGNOSIS — J9601 Acute respiratory failure with hypoxia: Secondary | ICD-10-CM

## 2021-12-27 DIAGNOSIS — Z720 Tobacco use: Secondary | ICD-10-CM | POA: Diagnosis present

## 2021-12-27 DIAGNOSIS — F10229 Alcohol dependence with intoxication, unspecified: Principal | ICD-10-CM | POA: Diagnosis present

## 2021-12-27 DIAGNOSIS — R002 Palpitations: Secondary | ICD-10-CM | POA: Diagnosis present

## 2021-12-27 DIAGNOSIS — Y908 Blood alcohol level of 240 mg/100 ml or more: Secondary | ICD-10-CM | POA: Diagnosis present

## 2021-12-27 DIAGNOSIS — Z20822 Contact with and (suspected) exposure to covid-19: Secondary | ICD-10-CM | POA: Diagnosis present

## 2021-12-27 DIAGNOSIS — Z5900 Homelessness unspecified: Secondary | ICD-10-CM

## 2021-12-27 DIAGNOSIS — R0902 Hypoxemia: Secondary | ICD-10-CM | POA: Diagnosis present

## 2021-12-27 DIAGNOSIS — F32A Depression, unspecified: Secondary | ICD-10-CM | POA: Diagnosis present

## 2021-12-27 DIAGNOSIS — G4700 Insomnia, unspecified: Secondary | ICD-10-CM | POA: Diagnosis present

## 2021-12-27 DIAGNOSIS — G40909 Epilepsy, unspecified, not intractable, without status epilepticus: Secondary | ICD-10-CM

## 2021-12-27 DIAGNOSIS — F064 Anxiety disorder due to known physiological condition: Secondary | ICD-10-CM | POA: Diagnosis present

## 2021-12-27 DIAGNOSIS — K760 Fatty (change of) liver, not elsewhere classified: Secondary | ICD-10-CM | POA: Diagnosis present

## 2021-12-27 DIAGNOSIS — F10920 Alcohol use, unspecified with intoxication, uncomplicated: Secondary | ICD-10-CM

## 2021-12-27 DIAGNOSIS — F1020 Alcohol dependence, uncomplicated: Secondary | ICD-10-CM | POA: Diagnosis present

## 2021-12-27 MED ORDER — LORAZEPAM 1 MG PO TABS: 0.0000 mg | ORAL_TABLET | Freq: Four times a day (QID) | ORAL | Status: DC

## 2021-12-27 MED ORDER — THIAMINE HCL 100 MG/ML IJ SOLN: 100.0000 mg | Freq: Every day | INTRAMUSCULAR | Status: DC

## 2021-12-27 MED ORDER — THIAMINE HCL 100 MG PO TABS
100.0000 mg | ORAL_TABLET | Freq: Every day | ORAL | Status: DC
  Administered 2021-12-28 – 2021-12-29 (×2): 100 mg via ORAL
  Filled 2021-12-27 (×2): qty 1

## 2021-12-27 MED ORDER — LORAZEPAM 2 MG/ML IJ SOLN: 0.0000 mg | Freq: Two times a day (BID) | INTRAMUSCULAR | Status: DC

## 2021-12-27 MED ORDER — LORAZEPAM 1 MG PO TABS: 0.0000 mg | ORAL_TABLET | Freq: Two times a day (BID) | ORAL | Status: DC

## 2021-12-27 MED ORDER — ALBUTEROL SULFATE (2.5 MG/3ML) 0.083% IN NEBU
2.5000 mg | INHALATION_SOLUTION | Freq: Once | RESPIRATORY_TRACT | Status: AC
Start: 2021-12-27 — End: 2021-12-27
  Administered 2021-12-27: 2.5 mg via RESPIRATORY_TRACT
  Filled 2021-12-27: qty 3

## 2021-12-27 MED ORDER — LORAZEPAM 2 MG/ML IJ SOLN
0.0000 mg | Freq: Four times a day (QID) | INTRAMUSCULAR | Status: DC
  Filled 2021-12-27: qty 1

## 2021-12-27 NOTE — ED Triage Notes (Signed)
Arrives EMS from [employer] requesting alcohol detox.   Called out for chest pain and detox from alcohol.   Found wheezing in all fields. 5mg  albuterol nebulized prior to arrival by paramedic.   Pt sts subjective seizure.   Usually drinks 4- 40oz of beer. But says he only had some this morning. Poor historian.   18g L fa.  500cc NS admin pta.

## 2021-12-27 NOTE — ED Provider Notes (Signed)
[facility]-EMERGENCY DEPT Provider Note   CSN: [id number] Arrival date & time: 12/27/21  2159     History {Add pertinent medical, surgical, social history, OB history to HPI:1} Chief Complaint  Patient presents with   Requesting Detox   Chest Pain    Gary Mclaughlin is a 51 y.o. male who presents emergency department for request for alcohol detox.  He presents from the [employer].  When asked how much the patient drinks daily he reports "all of it."  Patient states that he drank 4, 40 ounce malt beverages prior to arrival.  He also smokes heavily.  He was noted to have wheezing and hypoxia at 81% upon arrival.  Patient claims to have a history of seizures with alcohol withdrawal.  He denies SI HI or AVH.   Chest Pain      Home Medications Prior to Admission medications   Medication Sig Start Date End Date Taking? Authorizing Provider  albuterol (VENTOLIN HFA) 108 (90 Base) MCG/ACT inhaler Inhale 2 puffs into the lungs every 6 (six) hours as needed for wheezing or shortness of breath. 10/30/21   Lanae Boast, MD  levETIRAcetam (KEPPRA) 500 MG tablet Take 1 tablet (500 mg total) by mouth 2 (two) times daily. 10/30/21 11/29/21  Lanae Boast, MD      Allergies    Patient has no known allergies.    Review of Systems   Review of Systems  Cardiovascular:  Positive for chest pain.    Physical Exam Updated Vital Signs BP 101/60   Pulse 78   Temp 98.3 F (36.8 C) (Oral)   Resp 18   Ht 5\' 8"  (1.727 m)   Wt 74.8 kg   SpO2 96%   BMI 25.09 kg/m  Physical Exam Vitals and nursing note reviewed.  Constitutional:      General: He is not in acute distress.    Appearance: He is well-developed. He is not diaphoretic.     Interventions: Nasal cannula in place.  HENT:     Head: Normocephalic and atraumatic.  Eyes:     General: No scleral icterus.    Conjunctiva/sclera: Conjunctivae normal.  Cardiovascular:     Rate and Rhythm: Normal rate and regular rhythm.      Heart sounds: Normal heart sounds.  Pulmonary:     Effort: Pulmonary effort is normal. No respiratory distress.     Breath sounds: Wheezing present.  Abdominal:     Palpations: Abdomen is soft.     Tenderness: There is no abdominal tenderness.  Musculoskeletal:     Cervical back: Normal range of motion and neck supple.  Skin:    General: Skin is warm and dry.  Neurological:     Mental Status: He is lethargic.  Psychiatric:     Comments: Appears intoxicated     ED Results / Procedures / Treatments   Labs (all labs ordered are listed, but only abnormal results are displayed) Labs Reviewed  BASIC METABOLIC PANEL  BRAIN NATRIURETIC PEPTIDE  RAPID URINE DRUG SCREEN, HOSP PERFORMED  ETHANOL    EKG None  Radiology DG Chest Port 1 View  Result Date: 12/27/2021 CLINICAL DATA:  Wheezing EXAM: PORTABLE CHEST 1 VIEW COMPARISON:  10/26/2021 FINDINGS: The heart size and mediastinal contours are within normal limits. Both lungs are clear. Old left tenth rib fracture IMPRESSION: No active disease. Electronically Signed   By: 10/28/2021 M.D.   On: 12/27/2021 23:13    Procedures Procedures  {Document cardiac monitor, telemetry assessment  procedure when appropriate:1}  Medications Ordered in ED Medications  LORazepam (ATIVAN) injection 0-4 mg (has no administration in time range)    Or  LORazepam (ATIVAN) tablet 0-4 mg (has no administration in time range)  LORazepam (ATIVAN) injection 0-4 mg (has no administration in time range)    Or  LORazepam (ATIVAN) tablet 0-4 mg (has no administration in time range)  thiamine (VITAMIN B1) tablet 100 mg (has no administration in time range)    Or  thiamine (VITAMIN B1) injection 100 mg (has no administration in time range)  albuterol (PROVENTIL) (2.5 MG/3ML) 0.083% nebulizer solution 2.5 mg (has no administration in time range)    ED Course/ Medical Decision Making/ A&P                           Medical Decision Making Amount and/or  Complexity of Data Reviewed Labs: ordered. Radiology: ordered.  Risk OTC drugs. Prescription drug management.   ***  {Document critical care time when appropriate:1} {Document review of labs and clinical decision tools ie heart score, Chads2Vasc2 etc:1}  {Document your independent review of radiology images, and any outside records:1} {Document your discussion with family members, caretakers, and with consultants:1} {Document social determinants of health affecting pt's care:1} {Document your decision making why or why not admission, treatments were needed:1} Final Clinical Impression(s) / ED Diagnoses Final diagnoses:  None    Rx / DC Orders ED Discharge Orders     None

## 2021-12-28 ENCOUNTER — Encounter (HOSPITAL_COMMUNITY): Payer: Self-pay | Admitting: Internal Medicine

## 2021-12-28 DIAGNOSIS — Z720 Tobacco use: Secondary | ICD-10-CM

## 2021-12-28 DIAGNOSIS — F10220 Alcohol dependence with intoxication, uncomplicated: Secondary | ICD-10-CM

## 2021-12-28 DIAGNOSIS — F10920 Alcohol use, unspecified with intoxication, uncomplicated: Secondary | ICD-10-CM

## 2021-12-28 DIAGNOSIS — R0789 Other chest pain: Secondary | ICD-10-CM

## 2021-12-28 DIAGNOSIS — G40909 Epilepsy, unspecified, not intractable, without status epilepticus: Secondary | ICD-10-CM

## 2021-12-28 DIAGNOSIS — R079 Chest pain, unspecified: Secondary | ICD-10-CM

## 2021-12-28 DIAGNOSIS — J441 Chronic obstructive pulmonary disease with (acute) exacerbation: Secondary | ICD-10-CM

## 2021-12-28 LAB — CBC
HCT: 34 % — ABNORMAL LOW (ref 39.0–52.0)
Hemoglobin: 11.4 g/dL — ABNORMAL LOW (ref 13.0–17.0)
MCH: 33.8 pg (ref 26.0–34.0)
MCHC: 33.5 g/dL (ref 30.0–36.0)
MCV: 100.9 fL — ABNORMAL HIGH (ref 80.0–100.0)
Platelets: 190 10*3/uL (ref 150–400)
RBC: 3.37 MIL/uL — ABNORMAL LOW (ref 4.22–5.81)
RDW: 13.8 % (ref 11.5–15.5)
WBC: 3.2 10*3/uL — ABNORMAL LOW (ref 4.0–10.5)
nRBC: 0 % (ref 0.0–0.2)

## 2021-12-28 LAB — BLOOD GAS, VENOUS
Acid-base deficit: 0.1 mmol/L (ref 0.0–2.0)
Bicarbonate: 26.8 mmol/L (ref 20.0–28.0)
O2 Saturation: 98 %
Patient temperature: 37.1
pCO2, Ven: 52 mmHg (ref 44–60)
pH, Ven: 7.32 (ref 7.25–7.43)
pO2, Ven: 87 mmHg — ABNORMAL HIGH (ref 32–45)

## 2021-12-28 LAB — RESP PANEL BY RT-PCR (FLU A&B, COVID) ARPGX2
Influenza A by PCR: NEGATIVE
Influenza B by PCR: NEGATIVE
SARS Coronavirus 2 by RT PCR: NEGATIVE

## 2021-12-28 LAB — BASIC METABOLIC PANEL
Anion gap: 11 (ref 5–15)
BUN: 6 mg/dL (ref 6–20)
CO2: 24 mmol/L (ref 22–32)
Calcium: 9.1 mg/dL (ref 8.9–10.3)
Chloride: 100 mmol/L (ref 98–111)
Creatinine, Ser: 0.65 mg/dL (ref 0.61–1.24)
GFR, Estimated: >60 mL/min (ref >60)
Glucose, Bld: 105 mg/dL — ABNORMAL HIGH (ref 70–99)
Potassium: 3.5 mmol/L (ref 3.5–5.1)
Sodium: 135 mmol/L (ref 135–145)

## 2021-12-28 LAB — COMPREHENSIVE METABOLIC PANEL
ALT: 53 U/L — ABNORMAL HIGH (ref 0–44)
AST: 56 U/L — ABNORMAL HIGH (ref 15–41)
Albumin: 3.5 g/dL (ref 3.5–5.0)
Alkaline Phosphatase: 85 U/L (ref 38–126)
Anion gap: 10 (ref 5–15)
BUN: 5 mg/dL — ABNORMAL LOW (ref 6–20)
CO2: 21 mmol/L — ABNORMAL LOW (ref 22–32)
Calcium: 9.1 mg/dL (ref 8.9–10.3)
Chloride: 110 mmol/L (ref 98–111)
Creatinine, Ser: 0.51 mg/dL — ABNORMAL LOW (ref 0.61–1.24)
GFR, Estimated: >60 mL/min (ref >60)
Glucose, Bld: 144 mg/dL — ABNORMAL HIGH (ref 70–99)
Potassium: 4.8 mmol/L (ref 3.5–5.1)
Sodium: 141 mmol/L (ref 135–145)
Total Bilirubin: 0.3 mg/dL (ref 0.3–1.2)
Total Protein: 7.3 g/dL (ref 6.5–8.1)

## 2021-12-28 LAB — BRAIN NATRIURETIC PEPTIDE: B Natriuretic Peptide: 33.6 pg/mL (ref 0.0–100.0)

## 2021-12-28 LAB — RAPID URINE DRUG SCREEN, HOSP PERFORMED
Amphetamines: NOT DETECTED
Barbiturates: NOT DETECTED
Benzodiazepines: NOT DETECTED
Cocaine: NOT DETECTED
Opiates: NOT DETECTED
Tetrahydrocannabinol: NOT DETECTED

## 2021-12-28 LAB — ETHANOL: Alcohol, Ethyl (B): 399 mg/dL (ref <10)

## 2021-12-28 LAB — PHOSPHORUS: Phosphorus: 2.6 mg/dL (ref 2.5–4.6)

## 2021-12-28 LAB — MRSA NEXT GEN BY PCR, NASAL: MRSA by PCR Next Gen: NOT DETECTED

## 2021-12-28 MED ORDER — KCL IN DEXTROSE-NACL 20-5-0.9 MEQ/L-%-% IV SOLN: INTRAVENOUS | Status: DC

## 2021-12-28 MED ORDER — ALBUTEROL SULFATE (2.5 MG/3ML) 0.083% IN NEBU
10.0000 mg | INHALATION_SOLUTION | RESPIRATORY_TRACT | Status: AC
  Administered 2021-12-28: 10 mg via RESPIRATORY_TRACT
  Filled 2021-12-28 (×2): qty 12

## 2021-12-28 MED ORDER — LORAZEPAM 1 MG PO TABS: 0.0000 mg | ORAL_TABLET | Freq: Three times a day (TID) | ORAL | Status: DC

## 2021-12-28 MED ORDER — PROCHLORPERAZINE EDISYLATE 10 MG/2ML IJ SOLN: 5.0000 mg | INTRAMUSCULAR | Status: DC | PRN

## 2021-12-28 MED ORDER — CHLORHEXIDINE GLUCONATE CLOTH 2 % EX PADS
6.0000 | MEDICATED_PAD | Freq: Every day | CUTANEOUS | Status: DC
  Administered 2021-12-28: 6 via TOPICAL

## 2021-12-28 MED ORDER — FOLIC ACID 1 MG PO TABS
1.0000 mg | ORAL_TABLET | Freq: Every day | ORAL | Status: DC
  Administered 2021-12-28 – 2021-12-29 (×2): 1 mg via ORAL
  Filled 2021-12-28 (×2): qty 1

## 2021-12-28 MED ORDER — IPRATROPIUM-ALBUTEROL 0.5-2.5 (3) MG/3ML IN SOLN
3.0000 mL | RESPIRATORY_TRACT | Status: DC | PRN
  Administered 2021-12-29: 3 mL via RESPIRATORY_TRACT
  Filled 2021-12-28: qty 3

## 2021-12-28 MED ORDER — PREDNISONE 20 MG PO TABS
40.0000 mg | ORAL_TABLET | Freq: Every day | ORAL | Status: DC
  Administered 2021-12-29: 40 mg via ORAL
  Filled 2021-12-28: qty 2

## 2021-12-28 MED ORDER — LORAZEPAM 2 MG/ML IJ SOLN: 1.0000 mg | INTRAMUSCULAR | Status: DC | PRN

## 2021-12-28 MED ORDER — POTASSIUM CHLORIDE CRYS ER 20 MEQ PO TBCR
40.0000 meq | EXTENDED_RELEASE_TABLET | Freq: Once | ORAL | Status: AC
Start: 2021-12-28 — End: 2021-12-28
  Administered 2021-12-28: 40 meq via ORAL
  Filled 2021-12-28: qty 2

## 2021-12-28 MED ORDER — ORAL CARE MOUTH RINSE: 15.0000 mL | OROMUCOSAL | Status: DC | PRN

## 2021-12-28 MED ORDER — ALBUTEROL SULFATE (2.5 MG/3ML) 0.083% IN NEBU: 2.5000 mg | INHALATION_SOLUTION | RESPIRATORY_TRACT | Status: DC | PRN

## 2021-12-28 MED ORDER — LORAZEPAM 1 MG PO TABS
0.0000 mg | ORAL_TABLET | ORAL | Status: DC
  Administered 2021-12-28: 2 mg via ORAL
  Administered 2021-12-28: 1 mg via ORAL
  Administered 2021-12-28 (×2): 2 mg via ORAL
  Administered 2021-12-29: 1 mg via ORAL
  Filled 2021-12-28: qty 1
  Filled 2021-12-28 (×3): qty 2
  Filled 2021-12-28: qty 1

## 2021-12-28 MED ORDER — MAGNESIUM SULFATE 2 GM/50ML IV SOLN
2.0000 g | Freq: Once | INTRAVENOUS | Status: AC
  Administered 2021-12-28: 2 g via INTRAVENOUS
  Filled 2021-12-28: qty 50

## 2021-12-28 MED ORDER — ADULT MULTIVITAMIN W/MINERALS CH
1.0000 | ORAL_TABLET | Freq: Every day | ORAL | Status: DC
  Administered 2021-12-28 – 2021-12-29 (×2): 1 via ORAL
  Filled 2021-12-28 (×2): qty 1

## 2021-12-28 MED ORDER — LORAZEPAM 1 MG PO TABS: 1.0000 mg | ORAL_TABLET | ORAL | Status: DC | PRN

## 2021-12-28 MED ORDER — IPRATROPIUM-ALBUTEROL 0.5-2.5 (3) MG/3ML IN SOLN
3.0000 mL | Freq: Four times a day (QID) | RESPIRATORY_TRACT | Status: DC
  Administered 2021-12-28: 3 mL via RESPIRATORY_TRACT
  Filled 2021-12-28: qty 3

## 2021-12-28 MED ORDER — LEVETIRACETAM 500 MG PO TABS
500.0000 mg | ORAL_TABLET | Freq: Two times a day (BID) | ORAL | Status: DC
  Administered 2021-12-28 – 2021-12-29 (×3): 500 mg via ORAL
  Filled 2021-12-28 (×3): qty 1

## 2021-12-28 MED ORDER — ALBUTEROL SULFATE (2.5 MG/3ML) 0.083% IN NEBU
10.0000 mg | INHALATION_SOLUTION | RESPIRATORY_TRACT | Status: AC
  Administered 2021-12-28: 10 mg via RESPIRATORY_TRACT

## 2021-12-28 MED ORDER — METHYLPREDNISOLONE SODIUM SUCC 125 MG IJ SOLR
125.0000 mg | Freq: Once | INTRAMUSCULAR | Status: AC
  Administered 2021-12-28: 125 mg via INTRAVENOUS
  Filled 2021-12-28: qty 2

## 2021-12-28 MED ORDER — SODIUM CHLORIDE 0.9 % IV BOLUS
1000.0000 mL | Freq: Once | INTRAVENOUS | Status: AC
Start: 2021-12-28 — End: 2021-12-28
  Administered 2021-12-28: 1000 mL via INTRAVENOUS

## 2021-12-28 NOTE — Plan of Care (Signed)
  Problem: Respiratory: Goal: Ability to maintain a clear airway will improve Outcome: Progressing Goal: Levels of oxygenation will improve Outcome: Progressing   Problem: Nutrition: Goal: Adequate nutrition will be maintained Outcome: Progressing   Problem: Pain Managment: Goal: General experience of comfort will improve Outcome: Progressing

## 2021-12-28 NOTE — Social Work (Signed)
CSW, added substance resources to the Pt chart.

## 2021-12-28 NOTE — H&P (Signed)
History and Physical    Patient: Gary Mclaughlin QMV:[id number] DOB: 1970-12-06 DOA: 12/27/2021 DOS: the patient was seen and examined on 12/28/2021 PCP: Pcp, No  Patient coming from: Homeless  Chief Complaint:  Chief Complaint  Patient presents with   Requesting Detox   Chest Pain   HPI: Gary Mclaughlin is a 51 y.o. male with medical history significant of depression, EtOH abuse, history of alcohol withdrawal, seizure disorder, hepatic steatosis, subarachnoid hemorrhage, tobacco abuse who is homeless who is [facility] pain evaluation and alcohol detox.  The patient usually drinks 160 ounces of beer daily, but only drank a small fraction of that yesterday.  EMS found that the patient was wheezing and gave him 5 mg albuterol neb prior to arrival.  He smokes half to 1 pack of cigarettes daily.  He has been wheezing for the past 2 days.  He was also having chest pressure and palpitations, which he attributed to anxiety due to withdrawals.  His symptoms have disappeared since treatment given.  He denied fever, chills, rhinorrhea, sore throat, wheezing or hemoptysis.  No diaphoresis, PND, orthopnea or pitting edema of the lower extremities.  No abdominal pain, nausea, emesis,  constipation, melena or hematochezia, but stated he has been getting diarrhea 2 or 3 times a day which he attributes to drinking.  No flank pain, dysuria, frequency or hematuria.  No polyuria, polydipsia, polyphagia or blurred vision.  He has been having trouble with insomnia due to alcohol consumption, also increased anxiety and feels depressed after relapsing in January following 2 years of alcohol abstinence.  No SI or HI.  He has not been taking his medications.  ED course: Initial vital signs were temperature 98.3 F, pulse 95, respiration 18, BP 90/68 mmHg O2 sat 81% on room air.  The patient received supplemental oxygen, bronchodilators, methylprednisolone 125 mg IVP, magnesium sulfate 2 g IVPB and a 1000  mL normal saline bolus.  Lab work: Venous blood gas showed PO2 87 mmHg, but was otherwise normal.  UDS negative.  Coronavirus and influenza PCR negative.  BNP was 33.6 pg/mL.  BMP showed a glucose of 105 mg/dL, but was otherwise normal.  Alcohol level was 399 mg/dL.  Imaging: A portable 1 view chest radiograph showed normal heart size and mediastinal contours with no active disease.  There was a normal left 10 rib fracture.   Review of Systems: As mentioned in the history of present illness. All other systems reviewed and are negative.  Past Medical History:  Diagnosis Date   Depression    ETOH abuse    Hepatic steatosis 10/30/2021   Subarachnoid hemorrhage (HCC) 07/28/2017   Tobacco abuse 10/27/2021   History reviewed. No pertinent surgical history. Social History:  reports that he has quit smoking. His smoking use included cigarettes. He has a 25.00 pack-year smoking history. He has never used smokeless tobacco. He reports current alcohol use. He reports that he does not use drugs.  No Known Allergies  Family History  Problem Relation Age of Onset   Hypertension Other     Prior to Admission medications   Medication Sig Start Date End Date Taking? Authorizing Provider  albuterol (VENTOLIN HFA) 108 (90 Base) MCG/ACT inhaler Inhale 2 puffs into the lungs every 6 (six) hours as needed for wheezing or shortness of breath. Patient not taking: Reported on 12/27/2021 10/30/21   Lanae Boast, MD  levETIRAcetam (KEPPRA) 500 MG tablet Take 1 tablet (500 mg total) by mouth 2 (two) times daily.  Patient not taking: Reported on 12/27/2021 10/30/21 11/29/21  Lanae Boast, MD    Physical Exam: Vitals:   12/28/21 0439 12/28/21 0449 12/28/21 0530 12/28/21 0545  BP:   108/86   Pulse: 82  93   Resp:      Temp:    97.8 F (36.6 C)  TempSrc:    Axillary  SpO2: (!) 80% 100% 99%   Weight:      Height:       Physical Exam Vitals and nursing note reviewed.  Constitutional:      General: He is awake. He  is not in acute distress.    Appearance: He is well-developed. He is not toxic-appearing.     Comments: Chronically ill-appearing.  HENT:     Head: Normocephalic.     Nose: No rhinorrhea.     Mouth/Throat:     Mouth: Mucous membranes are moist.  Eyes:     General: No scleral icterus.    Pupils: Pupils are equal, round, and reactive to light.  Neck:     Vascular: No JVD.  Cardiovascular:     Rate and Rhythm: Normal rate and regular rhythm.     Heart sounds: S1 normal and S2 normal.  Pulmonary:     Effort: Pulmonary effort is normal.     Breath sounds: Normal breath sounds.  Abdominal:     General: Bowel sounds are normal. There is no distension.     Palpations: Abdomen is soft.     Tenderness: There is no abdominal tenderness. There is no right CVA tenderness, left CVA tenderness, guarding or rebound.  Musculoskeletal:     Cervical back: Neck supple.     Right lower leg: No edema.     Left lower leg: No edema.  Skin:    General: Skin is warm and dry.     Comments: Right pretibial area erythema with no significant edema, calor or tenderness.  Neurological:     General: No focal deficit present.     Mental Status: He is alert and oriented to person, place, and time.  Psychiatric:        Mood and Affect: Mood normal.        Behavior: Behavior normal. Behavior is cooperative.   Data Reviewed:  There are no new results to review at this time.  Assessment and Plan: Principal Problem:   Alcohol intoxication (HCC) In the setting of:   Alcohol dependence (HCC) Admit to stepdown/inpatient. Continue IV fluids. CIWA protocol with lorazepam. Magnesium sulfate supplementation. Folate, MVI and thiamine. Consult TOC. Resume antiepileptics.  Active Problems:   Seizure disorder (HCC) Resume Keppra 500 mg p.o. twice daily.    Tobacco abuse Declined nicotine replacement therapy for now. Smoking cessation advised.    Chest pain Resolved.    COPD with acute exacerbation  (HCC) Observation/telemetry Continue supplemental oxygen. Methylprednisolone 125 mg IVP x1. Followed by prednisone 40 mg p.o. daily in a.m. Scheduled and as needed bronchodilators. Follow-up CBC and chemistry in the morning.      Advance Care Planning:   Code Status: Full Code   Consults:   Family Communication:   Severity of Illness: The appropriate patient status for this patient is INPATIENT. Inpatient status is judged to be reasonable and necessary in order to provide the required intensity of service to ensure the patient's safety. The patient's presenting symptoms, physical exam findings, and initial radiographic and laboratory data in the context of their chronic comorbidities is felt to place them at high risk  for further clinical deterioration. Furthermore, it is not anticipated that the patient will be medically stable for discharge from the hospital within 2 midnights of admission.   * I certify that at the point of admission it is my clinical judgment that the patient will require inpatient hospital care spanning beyond 2 midnights from the point of admission due to high intensity of service, high risk for further deterioration and high frequency of surveillance required.*  Author: Bobette Mo, MD 12/28/2021 8:09 AM  For on call review [email or web address].   This document was prepared using Dragon voice recognition software and may contain some unintended transcription errors.

## 2021-12-29 LAB — CBC WITH DIFFERENTIAL/PLATELET
Abs Immature Granulocytes: 0.04 10*3/uL (ref 0.00–0.07)
Basophils Absolute: 0.1 10*3/uL (ref 0.0–0.1)
Basophils Relative: 1 %
Eosinophils Absolute: 0.1 10*3/uL (ref 0.0–0.5)
Eosinophils Relative: 1 %
HCT: 40.5 % (ref 39.0–52.0)
Hemoglobin: 13.4 g/dL (ref 13.0–17.0)
Immature Granulocytes: 0 %
Lymphocytes Relative: 11 %
Lymphs Abs: 1.1 10*3/uL (ref 0.7–4.0)
MCH: 33.4 pg (ref 26.0–34.0)
MCHC: 33.1 g/dL (ref 30.0–36.0)
MCV: 101 fL — ABNORMAL HIGH (ref 80.0–100.0)
Monocytes Absolute: 0.6 10*3/uL (ref 0.1–1.0)
Monocytes Relative: 6 %
Neutro Abs: 8 10*3/uL — ABNORMAL HIGH (ref 1.7–7.7)
Neutrophils Relative %: 81 %
Platelets: 253 10*3/uL (ref 150–400)
RBC: 4.01 MIL/uL — ABNORMAL LOW (ref 4.22–5.81)
RDW: 13.5 % (ref 11.5–15.5)
WBC: 9.9 10*3/uL (ref 4.0–10.5)
nRBC: 0 % (ref 0.0–0.2)

## 2021-12-29 MED ORDER — THIAMINE HCL 100 MG PO TABS
100.0000 mg | ORAL_TABLET | Freq: Every day | ORAL | 0 refills | Status: DC
  Filled 2021-12-29: qty 100, 100d supply, fill #0

## 2021-12-29 MED ORDER — FOLIC ACID 1 MG PO TABS
1.0000 mg | ORAL_TABLET | Freq: Every day | ORAL | 0 refills | Status: DC
  Filled 2021-12-29: qty 30, 30d supply, fill #0

## 2021-12-29 MED ORDER — PREDNISONE 20 MG PO TABS
40.0000 mg | ORAL_TABLET | Freq: Every day | ORAL | 0 refills | Status: AC
  Filled 2021-12-29: qty 10, 5d supply, fill #0

## 2021-12-29 MED ORDER — ADULT MULTIVITAMIN W/MINERALS CH
1.0000 | ORAL_TABLET | Freq: Every day | ORAL | 0 refills | Status: DC
  Filled 2021-12-29: qty 130, 130d supply, fill #0

## 2021-12-29 NOTE — Plan of Care (Signed)
  Problem: Respiratory: Goal: Ability to maintain a clear airway will improve Outcome: Progressing Goal: Levels of oxygenation will improve Outcome: Progressing   Problem: Clinical Measurements: Goal: Respiratory complications will improve Outcome: Progressing Goal: Cardiovascular complication will be avoided Outcome: Progressing   Problem: Activity: Goal: Risk for activity intolerance will decrease Outcome: Progressing   Problem: Nutrition: Goal: Adequate nutrition will be maintained Outcome: Progressing   Problem: Coping: Goal: Level of anxiety will decrease Outcome: Progressing   Problem: Elimination: Goal: Will not experience complications related to bowel motility Outcome: Progressing Goal: Will not experience complications related to urinary retention Outcome: Progressing   Problem: Pain Managment: Goal: General experience of comfort will improve Outcome: Progressing

## 2021-12-29 NOTE — Discharge Summary (Signed)
Physician Discharge Summary   Patient: Gary Mclaughlin MRN: [id number] DOB: [birth year]  Admit date:     12/27/2021  Discharge date: 12/29/21  Discharge Physician: Marguerita Merles, DO   PCP: Pcp, No   Recommendations at discharge:   Follow-up with PCP within 1 to 2 weeks and repeat CBC, CMP, mag, Phos within 1 week  Discharge Diagnoses: Principal Problem:   Alcohol intoxication (HCC) Active Problems:   Alcohol dependence (HCC)   Seizure disorder (HCC)   Tobacco abuse   Chest pain   COPD with acute exacerbation (HCC)  Resolved Problems:   * No resolved hospital problems. [facility] Course: HPI per Dr. Sanda Klein HPI: Gary Mclaughlin is a 51 y.o. male with medical history significant of depression, EtOH abuse, history of alcohol withdrawal, seizure disorder, hepatic steatosis, subarachnoid hemorrhage, tobacco abuse who is homeless who is sent [facility] for chest pain evaluation and alcohol detox.  The patient usually drinks 160 ounces of beer daily, but only drank a small fraction of that yesterday.  EMS found that the patient was wheezing and gave him 5 mg albuterol neb prior to arrival.  He smokes half to 1 pack of cigarettes daily.  He has been wheezing for the past 2 days.  He was also having chest pressure and palpitations, which he attributed to anxiety due to withdrawals.  His symptoms have disappeared since treatment given.  He denied fever, chills, rhinorrhea, sore throat, wheezing or hemoptysis.  No diaphoresis, PND, orthopnea or pitting edema of the lower extremities.  No abdominal pain, nausea, emesis,  constipation, melena or hematochezia, but stated he has been getting diarrhea 2 or 3 times a day which he attributes to drinking.  No flank pain, dysuria, frequency or hematuria.  No polyuria, polydipsia, polyphagia or blurred vision.  He has been having trouble with insomnia due to alcohol consumption, also increased anxiety and feels depressed after relapsing in January  following 2 years of alcohol abstinence.  No SI or HI.  He has not been taking his medications.   ED course: Initial vital signs were temperature 98.3 F, pulse 95, respiration 18, BP 90/68 mmHg O2 sat 81% on room air.  The patient received supplemental oxygen, bronchodilators, methylprednisolone 125 mg IVP, magnesium sulfate 2 g IVPB and a 1000 mL normal saline bolus.   Lab work: Venous blood gas showed PO2 87 mmHg, but was otherwise normal.  UDS negative.  Coronavirus and influenza PCR negative.  BNP was 33.6 pg/mL.  BMP showed a glucose of 105 mg/dL, but was otherwise normal.  Alcohol level was 399 mg/dL.   Imaging: A portable 1 view chest radiograph showed normal heart size and mediastinal contours with no active disease.  There was a normal left 10 rib fracture.  **Interim History Patient is continue to be treated for COPD exacerbation and he was slowly improving.  Did not really exhibit any withdrawal symptoms from alcohol but then subsequently decided that he did not want to remain hospitalized and stated that he needed to turn himself into the please so he signed out AGAINST MEDICAL ADVICE being of sound mind  Assessment and Plan:    Alcohol intoxication (HCC) In the setting of:   Alcohol dependence (HCC) Admit to stepdown/inpatient. Continue IV fluids. CIWA protocol with lorazepam. Magnesium sulfate supplementation. Folate, MVI and thiamine. Consult TOC. Resume antiepileptics. -Appeared a little depressed but did not endorse any SI or HI or AVH -Was not  please find any withdrawal symptoms and his last  CIWA score was 1     Seizure disorder (HCC) Resume Keppra 500 mg p.o. twice daily.     Tobacco abuse Declined nicotine replacement therapy for now. Smoking cessation advised.     Chest pain Resolved.  Metabolic Acidosis -Patient's CO2 is 21, anion gap is 10, chloride level is 110 -Continue monitor and trend and was going to repeat CMP in a.m. but patient left AGAINST  MEDICAL ADVICE  Abnormal LFTs -AST was 56 and ALT was 53 -In the setting of alcohol -Was going to continue to monitor and trend and if necessary was going to obtain a right upper quadrant ultrasound as well as an acute hepatitis panel but patient left AGAINST MEDICAL ADVICE   COPD with acute exacerbation (HCC) Observation/telemetry Continue supplemental oxygen. Methylprednisolone 125 mg IVP x1. Followed by prednisone 40 mg p.o. daily in a.m. Scheduled and as needed bronchodilators. -He was still wheezing this morning so his medications going to be adjusted -Was wanting to do an amatory home O2 screen and monitor him closely however he signed out AGAINST MEDICAL ADVICE    Consultants: None Procedures performed: None Disposition:  Left AGAINST MEDICAL ADVICE  Diet recommendation:  Cardiac diet  DISCHARGE MEDICATION: Allergies as of 12/29/2021   No Known Allergies      Medication List     TAKE these medications    albuterol 108 (90 Base) MCG/ACT inhaler Commonly known as: VENTOLIN HFA Inhale 2 puffs into the lungs every 6 (six) hours as needed for wheezing or shortness of breath.   folic acid 1 MG tablet Commonly known as: FOLVITE Take 1 tablet (1 mg total) by mouth daily. Start taking on: December 30, 2021   levETIRAcetam 500 MG tablet Commonly known as: Keppra Take 1 tablet (500 mg total) by mouth 2 (two) times daily.   multivitamin with minerals Tabs tablet Take 1 tablet by mouth daily. Start taking on: December 30, 2021   predniSONE 20 MG tablet Commonly known as: DELTASONE Take 2 tablets (40 mg total) by mouth daily with breakfast for 5 days. Start taking on: December 30, 2021   thiamine 100 MG tablet Commonly known as: VITAMIN B1 Take 1 tablet (100 mg total) by mouth daily. Start taking on: December 30, 2021       Discharge Exam: Ceasar Mons Weights   12/27/21 2208 12/29/21 1212  Weight: 74.8 kg 68.5 kg   Vitals:   12/29/21 1100 12/29/21 1212  BP: 118/69  134/80  Pulse: 60 68  Resp: 14 14  Temp:  98.4 F (36.9 C)  SpO2: 98% 94%   Examination: Physical Exam:  Constitutional: Thin Caucasian male currently no acute distress appears calm resting Respiratory: Diminished to auscultation bilaterally with coarse breath sounds and some expiratory wheezing noted no appreciable rhonchi or crackles.  Has a normal respiratory effort and he is not tachypneic or using any accessory muscles to breathe but was wearing supplemental oxygen via nasal cannula  Cardiovascular: RRR, no murmurs / rubs / gallops. S1 and S2 auscultated. No extremity edema.  Abdomen: Soft, non-tender, non-distended. Bowel sounds positive.  GU: Deferred. Musculoskeletal: No clubbing / cyanosis of digits/nails. No joint deformity upper and lower extremities.  Skin: No rashes, lesions, ulcers and has a tattoo on his Left Scapula. No induration; Warm and dry.  Neurologic: CN 2-12 grossly intact with no focal deficits. Romberg sign cerebellar reflexes not assessed.  Psychiatric: Normal judgment and insight. Alert and oriented x 3. Slightly withdrawn  Condition at discharge:  Guarded  The  results of significant diagnostics from this hospitalization (including imaging, microbiology, ancillary and laboratory) are listed below for reference.   Imaging Studies: DG Chest Port 1 View  Result Date: 12/27/2021 CLINICAL DATA:  Wheezing EXAM: PORTABLE CHEST 1 VIEW COMPARISON:  10/26/2021 FINDINGS: The heart size and mediastinal contours are within normal limits. Both lungs are clear. Old left tenth rib fracture IMPRESSION: No active disease. Electronically Signed   By: Jasmine Pang M.D.   On: 12/27/2021 23:13    Microbiology: Results for orders placed or performed during the hospital encounter of 12/27/21  Resp Panel by RT-PCR (Flu A&B, Covid) Anterior Nasal Swab     Status: None   Collection Time: 12/28/21  5:30 AM   Specimen: Anterior Nasal Swab  Result Value Ref Range Status   SARS  Coronavirus 2 by RT PCR NEGATIVE NEGATIVE Final    Comment: (NOTE) SARS-CoV-2 target nucleic acids are NOT DETECTED.  The SARS-CoV-2 RNA is generally detectable in upper respiratory specimens during the acute phase of infection. The lowest concentration of SARS-CoV-2 viral copies this assay can detect is 138 copies/mL. A negative result does not preclude SARS-Cov-2 infection and should not be used as the sole basis for treatment or other patient management decisions. A negative result may occur with  improper specimen collection/handling, submission of specimen other than nasopharyngeal swab, presence of viral mutation(s) within the areas targeted by this assay, and inadequate number of viral copies(<138 copies/mL). A negative result must be combined with clinical observations, patient history, and epidemiological information. The expected result is Negative.  Fact Sheet for Patients:  BloggerCourse.com  Fact Sheet for Healthcare Providers:  SeriousBroker.it  This test is no t yet approved or cleared by the [place] FDA and  has been authorized for detection and/or diagnosis of SARS-CoV-2 by FDA under an Emergency Use Authorization (EUA). This EUA will remain  in effect (meaning this test can be used) for the duration of the COVID-19 declaration under Section 564(b)(1) of the Act, 21 U.S.C.section 360bbb-3(b)(1), unless the authorization is terminated  or revoked sooner.       Influenza A by PCR NEGATIVE NEGATIVE Final   Influenza B by PCR NEGATIVE NEGATIVE Final    Comment: (NOTE) The Xpert Xpress SARS-CoV-2/FLU/RSV plus assay is intended as an aid in the diagnosis of influenza from Nasopharyngeal swab specimens and should not be used as a sole basis for treatment. Nasal washings and aspirates are unacceptable for Xpert Xpress SARS-CoV-2/FLU/RSV testing.  Fact Sheet for  Patients: BloggerCourse.com  Fact Sheet for Healthcare Providers: SeriousBroker.it  This test is not yet approved or cleared by the [place] FDA and has been authorized for detection and/or diagnosis of SARS-CoV-2 by FDA under an Emergency Use Authorization (EUA). This EUA will remain in effect (meaning this test can be used) for the duration of the COVID-19 declaration under Section 564(b)(1) of the Act, 21 U.S.C. section 360bbb-3(b)(1), unless the authorization is terminated or revoked.  Performed at [facility], [address], [place], [place] [address]   MRSA Next Gen by PCR, Nasal     Status: None   Collection Time: 12/28/21  2:29 PM   Specimen: Nasal Mucosa; Nasal Swab  Result Value Ref Range Status   MRSA by PCR Next Gen NOT DETECTED NOT DETECTED Final    Comment: (NOTE) The GeneXpert MRSA Assay (FDA approved for NASAL specimens only), is one component of a comprehensive MRSA colonization surveillance program. It is not intended to diagnose MRSA infection nor  to guide or monitor treatment for MRSA infections. Test performance is not FDA approved in patients less than 24 years old. Performed at [facility], [address], [place], [place] [address]    Labs: CBC: Recent Labs  Lab 12/28/21 1230 12/29/21 0842  WBC 3.2* 9.9  NEUTROABS  --  8.0*  HGB 11.4* 13.4  HCT 34.0* 40.5  MCV 100.9* 101.0*  PLT 190 253   Basic Metabolic Panel: Recent Labs  Lab 12/27/21 2330 12/28/21 1231  NA 135 141  K 3.5 4.8  CL 100 110  CO2 24 21*  GLUCOSE 105* 144*  BUN 6 5*  CREATININE 0.65 0.51*  CALCIUM 9.1 9.1  PHOS  --  2.6   Liver Function Tests: Recent Labs  Lab 12/28/21 1231  AST 56*  ALT 53*  ALKPHOS 85  BILITOT 0.3  PROT 7.3  ALBUMIN 3.5   CBG: No results for input(s): "GLUCAP" in the last 168 hours.  Discharge time spent: greater than 30  minutes.  Signed: Marguerita Merles, DO [facility] 12/29/2021

## 2021-12-29 NOTE — Progress Notes (Signed)
Patient stated he want's to leave to turn himself in, in jail, informed patient that he has not been discharged yet, he said he understand but wants to leave anyway. AMA paper given expalined to patient, Dr. Marland Mcalpine notified, patient signed AMA paper and left.

## 2021-12-30 ENCOUNTER — Other Ambulatory Visit (HOSPITAL_BASED_OUTPATIENT_CLINIC_OR_DEPARTMENT_OTHER): Payer: Self-pay

## 2022-01-08 ENCOUNTER — Other Ambulatory Visit (HOSPITAL_BASED_OUTPATIENT_CLINIC_OR_DEPARTMENT_OTHER): Payer: Self-pay

## 2022-01-09 ENCOUNTER — Emergency Department (HOSPITAL_COMMUNITY)
Admission: EM | Admit: 2022-01-09 | Discharge: 2022-01-10 | Disposition: A | Discharge status: Home / Self Care | Payer: Self-pay | Attending: Emergency Medicine | Admitting: Emergency Medicine

## 2022-01-09 ENCOUNTER — Encounter (HOSPITAL_COMMUNITY): Payer: Self-pay

## 2022-01-09 ENCOUNTER — Other Ambulatory Visit: Payer: Self-pay

## 2022-01-09 DIAGNOSIS — F1010 Alcohol abuse, uncomplicated: Secondary | ICD-10-CM

## 2022-01-09 DIAGNOSIS — F10129 Alcohol abuse with intoxication, unspecified: Secondary | ICD-10-CM | POA: Insufficient documentation

## 2022-01-09 DIAGNOSIS — Y908 Blood alcohol level of 240 mg/100 ml or more: Secondary | ICD-10-CM | POA: Insufficient documentation

## 2022-01-09 HISTORY — DX: Unspecified convulsions: R56.9

## 2022-01-09 LAB — SALICYLATE LEVEL: Salicylate Lvl: <7 mg/dL — ABNORMAL LOW (ref 7.0–30.0)

## 2022-01-09 LAB — COMPREHENSIVE METABOLIC PANEL
ALT: 53 U/L — ABNORMAL HIGH (ref 0–44)
AST: 66 U/L — ABNORMAL HIGH (ref 15–41)
Albumin: 3.9 g/dL (ref 3.5–5.0)
Alkaline Phosphatase: 77 U/L (ref 38–126)
Anion gap: 10 (ref 5–15)
BUN: 9 mg/dL (ref 6–20)
CO2: 22 mmol/L (ref 22–32)
Calcium: 8.9 mg/dL (ref 8.9–10.3)
Chloride: 98 mmol/L (ref 98–111)
Creatinine, Ser: 0.67 mg/dL (ref 0.61–1.24)
GFR, Estimated: >60 mL/min (ref >60)
Glucose, Bld: 110 mg/dL — ABNORMAL HIGH (ref 70–99)
Potassium: 3.9 mmol/L (ref 3.5–5.1)
Sodium: 130 mmol/L — ABNORMAL LOW (ref 135–145)
Total Bilirubin: 0.6 mg/dL (ref 0.3–1.2)
Total Protein: 7.5 g/dL (ref 6.5–8.1)

## 2022-01-09 LAB — CBC
HCT: 37.5 % — ABNORMAL LOW (ref 39.0–52.0)
Hemoglobin: 12.6 g/dL — ABNORMAL LOW (ref 13.0–17.0)
MCH: 33.2 pg (ref 26.0–34.0)
MCHC: 33.6 g/dL (ref 30.0–36.0)
MCV: 98.7 fL (ref 80.0–100.0)
Platelets: 254 10*3/uL (ref 150–400)
RBC: 3.8 MIL/uL — ABNORMAL LOW (ref 4.22–5.81)
RDW: 12.7 % (ref 11.5–15.5)
WBC: 5.7 10*3/uL (ref 4.0–10.5)
nRBC: 0 % (ref 0.0–0.2)

## 2022-01-09 LAB — RAPID URINE DRUG SCREEN, HOSP PERFORMED
Amphetamines: NOT DETECTED
Barbiturates: NOT DETECTED
Benzodiazepines: NOT DETECTED
Cocaine: NOT DETECTED
Opiates: NOT DETECTED
Tetrahydrocannabinol: NOT DETECTED

## 2022-01-09 LAB — ETHANOL: Alcohol, Ethyl (B): 464 mg/dL (ref <10)

## 2022-01-09 LAB — ACETAMINOPHEN LEVEL: Acetaminophen (Tylenol), Serum: <10 ug/mL — ABNORMAL LOW (ref 10–30)

## 2022-01-09 MED ORDER — SODIUM CHLORIDE 0.9 % IV BOLUS
1000.0000 mL | Freq: Once | INTRAVENOUS | Status: AC
  Administered 2022-01-09: 1000 mL via INTRAVENOUS

## 2022-01-09 NOTE — ED Notes (Signed)
Pt removed O2 again, reminded pt to leave [facility] on. Pt also stated that he urinated in the urinal then poured it in the sink. Reminded pt that we still need a urine sample, and to let staff know when he goes again.

## 2022-01-09 NOTE — ED Provider Notes (Signed)
Patient taken over from Arthor Captain, PA-C at shift handoff.  In short, 51 year old male patient presenting to the emergency department with chief complaint of alcohol intoxication.  Patient has been seen in the past for detox from alcohol.  He is requesting the same thing today.  He also notes that he is facing felony breaking and entering charges from stealing quarters from a car wash machine.  Patient admits to drinking extremely heavily and was charged with an open container violation earlier today.   Physical Exam  BP 95/64   Pulse 68   Temp 97.8 F (36.6 C) (Axillary)   Resp 18   Ht 5' 9.6" (1.768 m)   Wt 68.5 kg   SpO2 100%   BMI 21.92 kg/m   Physical Exam  Procedures  Procedures  ED Course / MDM    Medical Decision Making Amount and/or Complexity of Data Reviewed Labs: ordered.   Labs ordered prior to my seeing this patient.  Notable results include ethanol 464, sodium 130, AST 66, ALT 53, hemoglobin 12.6, salicylate level less than 7, acetaminophen level less than 10.  UDS pending  Plan to observe patient at this time while he metabolizes the alcohol.  Saline administration in progress.  Plan to continue evaluations for possible withdrawal symptoms as he metabolizes.  I ordered the patient saline boluses to help with hydration.  Upon reassessment the patient continued to become more alert and oriented  Plan for patient to discharge home after he completes the next bag of fluid.  Patient care transferred to [facility], PA-C at shift handoff.       Darrick Grinder, PA-C 01/09/22 2210    Loetta Rough, MD 01/10/22 [phone]

## 2022-01-09 NOTE — ED Triage Notes (Signed)
Pt brought in by [facility] from [place] parking lot. Pt is homeless, states he drank "Four 40s today" and needs detox. Pt also has hx of COPD, was 87% RA before being placed on nasal cannula.

## 2022-01-09 NOTE — ED Provider Notes (Signed)
This patient was given to me in handoff by previous provider.  See their note for full HPI.  Today he again requested detox.  In short, patient presented with alcohol intoxication.  Plan was to metabolize to freedom.   Physical Exam  BP (!) 146/74   Pulse (!) 58   Temp 97.7 F (36.5 C)   Resp 17   Ht 5' 9.6" (1.768 m)   Wt 68.5 kg   SpO2 98%   BMI 21.92 kg/m   Physical Exam Vitals and nursing note reviewed.  Constitutional:      Appearance: Normal appearance.  HENT:     Head: Normocephalic and atraumatic.  Eyes:     General: No scleral icterus.    Conjunctiva/sclera: Conjunctivae normal.  Pulmonary:     Effort: Pulmonary effort is normal. No respiratory distress.  Skin:    Findings: No rash.  Neurological:     Mental Status: He is alert.  Psychiatric:        Mood and Affect: Mood normal.     Procedures  Procedures  ED Course / MDM    Medical Decision Making Amount and/or Complexity of Data Reviewed Labs: ordered.     Patient arrived around 3 PM, nearly 12 hours ago.  He is arousable and stable for discharge.       Saddie Benders, PA-C 01/10/22 0151    Geoffery Lyons, MD 01/10/22 [phone]

## 2022-01-09 NOTE — ED Notes (Signed)
Pt standing up next to stretcher, has removed nasal cannula. Redirected pt back to bed, reminded pt of need for urine sample.

## 2022-01-09 NOTE — ED Provider Notes (Signed)
[facility]-EMERGENCY DEPT Provider Note   CSN: [id number] Arrival date & time: 01/09/22  1132     History {Add pertinent medical, surgical, social history, OB history to HPI:1} Chief Complaint  Patient presents with  . Alcohol Intoxication    Gary Mclaughlin is a 51 y.o. male.  He presents emergency department with a chief complaint of alcohol intoxication.  I have seen this patient in the past for the same complaint.  Patient states that last time he came in he was looking for detox from alcohol.  He would like the same thing.  Patient also notes that he is facing felony charges for breaking and entering because he tried to still quarters out of a car wash machine.  He feels like this is unfair.  Patient admits to drinking extremely heavily and also got some charges for open container today.   Alcohol Intoxication      Home Medications Prior to Admission medications   Medication Sig Start Date End Date Taking? Authorizing Provider  albuterol (VENTOLIN HFA) 108 (90 Base) MCG/ACT inhaler Inhale 2 puffs into the lungs every 6 (six) hours as needed for wheezing or shortness of breath. Patient not taking: Reported on 12/27/2021 10/30/21   Lanae Boast, MD  folic acid (FOLVITE) 1 MG tablet Take 1 tablet (1 mg total) by mouth daily. 12/30/21   Marguerita Merles Latif, DO  levETIRAcetam (KEPPRA) 500 MG tablet Take 1 tablet (500 mg total) by mouth 2 (two) times daily. Patient not taking: Reported on 12/27/2021 10/30/21 11/29/21  Lanae Boast, MD  Multiple Vitamin (MULTIVITAMIN WITH MINERALS) TABS tablet Take 1 tablet by mouth daily. 12/30/21   Marguerita Merles Latif, DO  thiamine (VITAMIN B1) 100 MG tablet Take 1 tablet (100 mg total) by mouth daily. 12/30/21   Merlene Laughter, DO      Allergies    Patient has no known allergies.    Review of Systems   Review of Systems  Physical Exam Updated Vital Signs BP 95/64   Pulse 68   Temp 97.8 F (36.6 C) (Axillary)   Resp 18   Ht  5' 9.6" (1.768 m)   Wt 68.5 kg   SpO2 100%   BMI 21.92 kg/m  Physical Exam Vitals and nursing note reviewed.  Constitutional:      General: He is not in acute distress.    Appearance: He is well-developed. He is not diaphoretic.  HENT:     Head: Normocephalic and atraumatic.  Eyes:     General: No scleral icterus.    Conjunctiva/sclera: Conjunctivae normal.  Cardiovascular:     Rate and Rhythm: Normal rate and regular rhythm.     Heart sounds: Normal heart sounds.  Pulmonary:     Effort: Pulmonary effort is normal. No respiratory distress.     Breath sounds: Normal breath sounds.  Abdominal:     Palpations: Abdomen is soft.     Tenderness: There is no abdominal tenderness.  Musculoskeletal:     Cervical back: Normal range of motion and neck supple.  Skin:    General: Skin is warm and dry.  Neurological:     Mental Status: He is alert.     Comments: Patient is clearly extremely intoxicated  Psychiatric:        Behavior: Behavior normal.    ED Results / Procedures / Treatments   Labs (all labs ordered are listed, but only abnormal results are displayed) Labs Reviewed  COMPREHENSIVE METABOLIC PANEL - Abnormal; Notable for  the following components:      Result Value   Sodium 130 (*)    Glucose, Bld 110 (*)    AST 66 (*)    ALT 53 (*)    All other components within normal limits  ETHANOL - Abnormal; Notable for the following components:   Alcohol, Ethyl (B) 464 (*)    All other components within normal limits  SALICYLATE LEVEL - Abnormal; Notable for the following components:   Salicylate Lvl <7.0 (*)    All other components within normal limits  ACETAMINOPHEN LEVEL - Abnormal; Notable for the following components:   Acetaminophen (Tylenol), Serum <10 (*)    All other components within normal limits  CBC - Abnormal; Notable for the following components:   RBC 3.80 (*)    Hemoglobin 12.6 (*)    HCT 37.5 (*)    All other components within normal limits  RAPID  URINE DRUG SCREEN, HOSP PERFORMED    EKG None  Radiology No results found.  Procedures Procedures  {Document cardiac monitor, telemetry assessment procedure when appropriate:1}  Medications Ordered in ED Medications - No data to display  ED Course/ Medical Decision Making/ A&P                           Medical Decision Making Amount and/or Complexity of Data Reviewed Labs: ordered.   ***  {Document critical care time when appropriate:1} {Document review of labs and clinical decision tools ie heart score, Chads2Vasc2 etc:1}  {Document your independent review of radiology images, and any outside records:1} {Document your discussion with family members, caretakers, and with consultants:1} {Document social determinants of health affecting pt's care:1} {Document your decision making why or why not admission, treatments were needed:1} Final Clinical Impression(s) / ED Diagnoses Final diagnoses:  None    Rx / DC Orders ED Discharge Orders     None

## 2022-01-10 NOTE — Discharge Instructions (Signed)
Do your best to stop drinking alcohol.

## 2022-01-10 NOTE — ED Notes (Signed)
Pt appears to be sleeping, observe even RR and unlabored, NAD noted, side rails up x2 for safety, plan of care ongoing to MTF, call light within reach, no further concerns as of present.

## 2022-04-03 ENCOUNTER — Emergency Department (HOSPITAL_COMMUNITY): Payer: Medicaid Other

## 2022-04-03 ENCOUNTER — Inpatient Hospital Stay (HOSPITAL_COMMUNITY): Payer: Medicaid Other

## 2022-04-03 ENCOUNTER — Inpatient Hospital Stay (HOSPITAL_COMMUNITY)
Admission: EM | Admit: 2022-04-03 | Discharge: 2022-04-08 | DRG: 208 | Discharge status: Against Medical Advice | Payer: Medicaid Other | Attending: Internal Medicine | Admitting: Internal Medicine

## 2022-04-03 DIAGNOSIS — S81801A Unspecified open wound, right lower leg, initial encounter: Secondary | ICD-10-CM | POA: Diagnosis present

## 2022-04-03 DIAGNOSIS — Z781 Physical restraint status: Secondary | ICD-10-CM

## 2022-04-03 DIAGNOSIS — F419 Anxiety disorder, unspecified: Secondary | ICD-10-CM | POA: Diagnosis present

## 2022-04-03 DIAGNOSIS — R451 Restlessness and agitation: Secondary | ICD-10-CM | POA: Diagnosis not present

## 2022-04-03 DIAGNOSIS — Z87891 Personal history of nicotine dependence: Secondary | ICD-10-CM | POA: Diagnosis not present

## 2022-04-03 DIAGNOSIS — E876 Hypokalemia: Secondary | ICD-10-CM | POA: Diagnosis present

## 2022-04-03 DIAGNOSIS — F10231 Alcohol dependence with withdrawal delirium: Secondary | ICD-10-CM | POA: Diagnosis not present

## 2022-04-03 DIAGNOSIS — Y908 Blood alcohol level of 240 mg/100 ml or more: Secondary | ICD-10-CM | POA: Diagnosis present

## 2022-04-03 DIAGNOSIS — F10930 Alcohol use, unspecified with withdrawal, uncomplicated: Secondary | ICD-10-CM | POA: Diagnosis not present

## 2022-04-03 DIAGNOSIS — G929 Unspecified toxic encephalopathy: Secondary | ICD-10-CM | POA: Diagnosis present

## 2022-04-03 DIAGNOSIS — J9601 Acute respiratory failure with hypoxia: Secondary | ICD-10-CM | POA: Diagnosis present

## 2022-04-03 DIAGNOSIS — Z79899 Other long term (current) drug therapy: Secondary | ICD-10-CM | POA: Diagnosis not present

## 2022-04-03 DIAGNOSIS — X58XXXA Exposure to other specified factors, initial encounter: Secondary | ICD-10-CM | POA: Diagnosis present

## 2022-04-03 DIAGNOSIS — R569 Unspecified convulsions: Secondary | ICD-10-CM | POA: Diagnosis present

## 2022-04-03 DIAGNOSIS — Z5329 Procedure and treatment not carried out because of patient's decision for other reasons: Secondary | ICD-10-CM | POA: Diagnosis present

## 2022-04-03 DIAGNOSIS — J189 Pneumonia, unspecified organism: Secondary | ICD-10-CM | POA: Diagnosis present

## 2022-04-03 DIAGNOSIS — F10129 Alcohol abuse with intoxication, unspecified: Secondary | ICD-10-CM | POA: Diagnosis present

## 2022-04-03 DIAGNOSIS — Z87820 Personal history of traumatic brain injury: Secondary | ICD-10-CM

## 2022-04-03 DIAGNOSIS — Z8249 Family history of ischemic heart disease and other diseases of the circulatory system: Secondary | ICD-10-CM

## 2022-04-03 DIAGNOSIS — J439 Emphysema, unspecified: Secondary | ICD-10-CM | POA: Diagnosis present

## 2022-04-03 DIAGNOSIS — R4182 Altered mental status, unspecified: Secondary | ICD-10-CM | POA: Diagnosis not present

## 2022-04-03 DIAGNOSIS — Z1152 Encounter for screening for COVID-19: Secondary | ICD-10-CM | POA: Diagnosis not present

## 2022-04-03 DIAGNOSIS — J690 Pneumonitis due to inhalation of food and vomit: Secondary | ICD-10-CM | POA: Diagnosis present

## 2022-04-03 LAB — LIPASE, BLOOD: Lipase: 81 U/L — ABNORMAL HIGH (ref 11–51)

## 2022-04-03 LAB — RAPID URINE DRUG SCREEN, HOSP PERFORMED
Amphetamines: NOT DETECTED
Barbiturates: NOT DETECTED
Benzodiazepines: NOT DETECTED
Cocaine: NOT DETECTED
Opiates: NOT DETECTED
Tetrahydrocannabinol: NOT DETECTED

## 2022-04-03 LAB — URINALYSIS, ROUTINE W REFLEX MICROSCOPIC
Bilirubin Urine: NEGATIVE
Glucose, UA: NEGATIVE mg/dL
Ketones, ur: NEGATIVE mg/dL
Leukocytes,Ua: NEGATIVE
Nitrite: NEGATIVE
Protein, ur: 30 mg/dL — AB
Specific Gravity, Urine: 1.004 — ABNORMAL LOW (ref 1.005–1.030)
pH: 5 (ref 5.0–8.0)

## 2022-04-03 LAB — CBC WITH DIFFERENTIAL/PLATELET
Abs Immature Granulocytes: 0.06 10*3/uL (ref 0.00–0.07)
Basophils Absolute: 0.2 10*3/uL — ABNORMAL HIGH (ref 0.0–0.1)
Basophils Relative: 2 %
Eosinophils Absolute: 0.3 10*3/uL (ref 0.0–0.5)
Eosinophils Relative: 3 %
HCT: 41.9 % (ref 39.0–52.0)
Hemoglobin: 14 g/dL (ref 13.0–17.0)
Immature Granulocytes: 1 %
Lymphocytes Relative: 19 %
Lymphs Abs: 1.9 10*3/uL (ref 0.7–4.0)
MCH: 33.4 pg (ref 26.0–34.0)
MCHC: 33.4 g/dL (ref 30.0–36.0)
MCV: 100 fL (ref 80.0–100.0)
Monocytes Absolute: 0.7 10*3/uL (ref 0.1–1.0)
Monocytes Relative: 7 %
Neutro Abs: 6.9 10*3/uL (ref 1.7–7.7)
Neutrophils Relative %: 68 %
Platelets: 406 10*3/uL — ABNORMAL HIGH (ref 150–400)
RBC: 4.19 MIL/uL — ABNORMAL LOW (ref 4.22–5.81)
RDW: 12.4 % (ref 11.5–15.5)
WBC: 10 10*3/uL (ref 4.0–10.5)
nRBC: 0 % (ref 0.0–0.2)

## 2022-04-03 LAB — I-STAT ARTERIAL BLOOD GAS, ED
Acid-base deficit: 2 mmol/L (ref 0.0–2.0)
Bicarbonate: 24.7 mmol/L (ref 20.0–28.0)
Calcium, Ion: 1.13 mmol/L — ABNORMAL LOW (ref 1.15–1.40)
HCT: 39 % (ref 39.0–52.0)
Hemoglobin: 13.3 g/dL (ref 13.0–17.0)
O2 Saturation: 100 %
Patient temperature: 94.3
Potassium: 3.7 mmol/L (ref 3.5–5.1)
Sodium: 135 mmol/L (ref 135–145)
TCO2: 26 mmol/L (ref 22–32)
pCO2 arterial: 42.7 mmHg (ref 32–48)
pH, Arterial: 7.357 (ref 7.35–7.45)
pO2, Arterial: 289 mmHg — ABNORMAL HIGH (ref 83–108)

## 2022-04-03 LAB — COMPREHENSIVE METABOLIC PANEL
ALT: 63 U/L — ABNORMAL HIGH (ref 0–44)
AST: 106 U/L — ABNORMAL HIGH (ref 15–41)
Albumin: 3.3 g/dL — ABNORMAL LOW (ref 3.5–5.0)
Alkaline Phosphatase: 104 U/L (ref 38–126)
Anion gap: 16 — ABNORMAL HIGH (ref 5–15)
BUN: <5 mg/dL — ABNORMAL LOW (ref 6–20)
CO2: 23 mmol/L (ref 22–32)
Calcium: 9.3 mg/dL (ref 8.9–10.3)
Chloride: 95 mmol/L — ABNORMAL LOW (ref 98–111)
Creatinine, Ser: 0.66 mg/dL (ref 0.61–1.24)
GFR, Estimated: >60 mL/min (ref >60)
Glucose, Bld: 109 mg/dL — ABNORMAL HIGH (ref 70–99)
Potassium: 4.5 mmol/L (ref 3.5–5.1)
Sodium: 134 mmol/L — ABNORMAL LOW (ref 135–145)
Total Bilirubin: 0.2 mg/dL — ABNORMAL LOW (ref 0.3–1.2)
Total Protein: 7.4 g/dL (ref 6.5–8.1)

## 2022-04-03 LAB — RESP PANEL BY RT-PCR (FLU A&B, COVID) ARPGX2
Influenza A by PCR: NEGATIVE
Influenza B by PCR: NEGATIVE
SARS Coronavirus 2 by RT PCR: NEGATIVE

## 2022-04-03 LAB — GLUCOSE, CAPILLARY
Glucose-Capillary: 129 mg/dL — ABNORMAL HIGH (ref 70–99)
Glucose-Capillary: 92 mg/dL (ref 70–99)

## 2022-04-03 LAB — CBC
HCT: 38.8 % — ABNORMAL LOW (ref 39.0–52.0)
Hemoglobin: 13.3 g/dL (ref 13.0–17.0)
MCH: 33.9 pg (ref 26.0–34.0)
MCHC: 34.3 g/dL (ref 30.0–36.0)
MCV: 99 fL (ref 80.0–100.0)
Platelets: 347 10*3/uL (ref 150–400)
RBC: 3.92 MIL/uL — ABNORMAL LOW (ref 4.22–5.81)
RDW: 12.6 % (ref 11.5–15.5)
WBC: 6.7 10*3/uL (ref 4.0–10.5)
nRBC: 0 % (ref 0.0–0.2)

## 2022-04-03 LAB — PROTIME-INR
INR: 1 (ref 0.8–1.2)
Prothrombin Time: 12.6 seconds (ref 11.4–15.2)

## 2022-04-03 LAB — TROPONIN I (HIGH SENSITIVITY)
Troponin I (High Sensitivity): 6 ng/L (ref <18)
Troponin I (High Sensitivity): 6 ng/L (ref <18)

## 2022-04-03 LAB — MRSA NEXT GEN BY PCR, NASAL: MRSA by PCR Next Gen: NOT DETECTED

## 2022-04-03 LAB — ETHANOL: Alcohol, Ethyl (B): 536 mg/dL (ref <10)

## 2022-04-03 LAB — LACTIC ACID, PLASMA
Lactic Acid, Venous: 2.2 mmol/L (ref 0.5–1.9)
Lactic Acid, Venous: 2.1 mmol/L (ref 0.5–1.9)

## 2022-04-03 LAB — AMMONIA: Ammonia: 24 umol/L (ref 9–35)

## 2022-04-03 LAB — TSH: TSH: 1.42 u[IU]/mL (ref 0.350–4.500)

## 2022-04-03 LAB — CREATININE, SERUM
Creatinine, Ser: 0.52 mg/dL — ABNORMAL LOW (ref 0.61–1.24)
GFR, Estimated: >60 mL/min (ref >60)

## 2022-04-03 MED ORDER — LORAZEPAM 2 MG/ML IJ SOLN
1.0000 mg | INTRAMUSCULAR | Status: AC | PRN
  Administered 2022-04-04: 1 mg via INTRAVENOUS
  Administered 2022-04-04: 2 mg via INTRAVENOUS
  Administered 2022-04-06 (×2): 4 mg via INTRAVENOUS
  Administered 2022-04-06: 3 mg via INTRAVENOUS
  Filled 2022-04-03: qty 2
  Filled 2022-04-03: qty 1
  Filled 2022-04-03 (×2): qty 2
  Filled 2022-04-03: qty 1

## 2022-04-03 MED ORDER — DOCUSATE SODIUM 100 MG PO CAPS: 100.0000 mg | ORAL_CAPSULE | Freq: Two times a day (BID) | ORAL | Status: DC | PRN

## 2022-04-03 MED ORDER — SODIUM CHLORIDE 0.9 % IV SOLN: INTRAVENOUS | Status: DC

## 2022-04-03 MED ORDER — POLYETHYLENE GLYCOL 3350 17 G PO PACK: 17.0000 g | PACK | Freq: Every day | ORAL | Status: DC | PRN

## 2022-04-03 MED ORDER — ROCURONIUM BROMIDE 50 MG/5ML IV SOLN
INTRAVENOUS | Status: DC | PRN
  Administered 2022-04-03: 80 mg via INTRAVENOUS

## 2022-04-03 MED ORDER — LORAZEPAM 2 MG/ML IJ SOLN
0.0000 mg | INTRAMUSCULAR | Status: AC
  Administered 2022-04-04: 2 mg via INTRAVENOUS
  Administered 2022-04-05: 1 mg via INTRAVENOUS
  Filled 2022-04-03 (×2): qty 1

## 2022-04-03 MED ORDER — IPRATROPIUM-ALBUTEROL 0.5-2.5 (3) MG/3ML IN SOLN
3.0000 mL | Freq: Four times a day (QID) | RESPIRATORY_TRACT | Status: DC
  Administered 2022-04-03 (×2): 3 mL via RESPIRATORY_TRACT
  Filled 2022-04-03 (×2): qty 3

## 2022-04-03 MED ORDER — SODIUM CHLORIDE 0.9 % IV BOLUS: 500.0000 mL | Freq: Once | INTRAVENOUS | Status: DC

## 2022-04-03 MED ORDER — ADULT MULTIVITAMIN W/MINERALS CH
1.0000 | ORAL_TABLET | Freq: Every day | ORAL | Status: DC
  Administered 2022-04-04 – 2022-04-08 (×5): 1 via ORAL
  Filled 2022-04-03 (×5): qty 1

## 2022-04-03 MED ORDER — SODIUM CHLORIDE 0.9 % IV BOLUS
1000.0000 mL | Freq: Once | INTRAVENOUS | Status: AC
  Administered 2022-04-03: 1000 mL via INTRAVENOUS

## 2022-04-03 MED ORDER — PHENYLEPHRINE HCL-NACL 20-0.9 MG/250ML-% IV SOLN
25.0000 ug/min | INTRAVENOUS | Status: DC
  Filled 2022-04-03: qty 250

## 2022-04-03 MED ORDER — SODIUM CHLORIDE 0.9 % IV SOLN
500.0000 mg | INTRAVENOUS | Status: DC
  Administered 2022-04-03: 500 mg via INTRAVENOUS
  Filled 2022-04-03 (×2): qty 5

## 2022-04-03 MED ORDER — HEPARIN SODIUM (PORCINE) 5000 UNIT/ML IJ SOLN
5000.0000 [IU] | Freq: Three times a day (TID) | INTRAMUSCULAR | Status: DC
  Administered 2022-04-03 – 2022-04-04 (×3): 5000 [IU] via SUBCUTANEOUS
  Filled 2022-04-03 (×3): qty 1

## 2022-04-03 MED ORDER — SODIUM CHLORIDE 0.9 % IV SOLN: 250.0000 mL | INTRAVENOUS | Status: DC

## 2022-04-03 MED ORDER — DOCUSATE SODIUM 50 MG/5ML PO LIQD: 100.0000 mg | Freq: Two times a day (BID) | ORAL | Status: DC | PRN

## 2022-04-03 MED ORDER — NOREPINEPHRINE 4 MG/250ML-% IV SOLN: 2.0000 ug/min | INTRAVENOUS | Status: DC

## 2022-04-03 MED ORDER — FAMOTIDINE 20 MG PO TABS
20.0000 mg | ORAL_TABLET | Freq: Two times a day (BID) | ORAL | Status: DC
  Administered 2022-04-03 – 2022-04-04 (×2): 20 mg
  Filled 2022-04-03 (×3): qty 1

## 2022-04-03 MED ORDER — FOLIC ACID 1 MG PO TABS
1.0000 mg | ORAL_TABLET | Freq: Every day | ORAL | Status: DC
  Administered 2022-04-04 – 2022-04-08 (×5): 1 mg via ORAL
  Filled 2022-04-03 (×5): qty 1

## 2022-04-03 MED ORDER — LORAZEPAM 2 MG/ML IJ SOLN
0.0000 mg | Freq: Three times a day (TID) | INTRAMUSCULAR | Status: AC
  Administered 2022-04-06: 3 mg via INTRAVENOUS
  Administered 2022-04-06: 2 mg via INTRAVENOUS
  Filled 2022-04-03: qty 2
  Filled 2022-04-03: qty 1

## 2022-04-03 MED ORDER — FAMOTIDINE 20 MG PO TABS: 20.0000 mg | ORAL_TABLET | Freq: Two times a day (BID) | ORAL | Status: DC

## 2022-04-03 MED ORDER — IPRATROPIUM-ALBUTEROL 0.5-2.5 (3) MG/3ML IN SOLN: 3.0000 mL | Freq: Four times a day (QID) | RESPIRATORY_TRACT | Status: DC | PRN

## 2022-04-03 MED ORDER — FENTANYL 2500MCG IN NS 250ML (10MCG/ML) PREMIX INFUSION
50.0000 ug/h | INTRAVENOUS | Status: DC
  Administered 2022-04-03: 50 ug/h via INTRAVENOUS
  Filled 2022-04-03: qty 250

## 2022-04-03 MED ORDER — PHENYLEPHRINE HCL-NACL 20-0.9 MG/250ML-% IV SOLN
25.0000 ug/min | INTRAVENOUS | Status: DC
  Administered 2022-04-03: 25 ug/min via INTRAVENOUS

## 2022-04-03 MED ORDER — FENTANYL BOLUS VIA INFUSION: 50.0000 ug | INTRAVENOUS | Status: DC | PRN

## 2022-04-03 MED ORDER — ETOMIDATE 2 MG/ML IV SOLN
INTRAVENOUS | Status: DC | PRN
  Administered 2022-04-03: 20 mg via INTRAVENOUS

## 2022-04-03 MED ORDER — THIAMINE HCL 100 MG/ML IJ SOLN
100.0000 mg | Freq: Every day | INTRAMUSCULAR | Status: DC
  Administered 2022-04-03 – 2022-04-04 (×2): 100 mg via INTRAVENOUS
  Filled 2022-04-03 (×2): qty 2

## 2022-04-03 MED ORDER — PHENYLEPHRINE 80 MCG/ML (10ML) SYRINGE FOR IV PUSH (FOR BLOOD PRESSURE SUPPORT)
80.0000 ug | PREFILLED_SYRINGE | Freq: Once | INTRAVENOUS | Status: AC | PRN
  Administered 2022-04-03: 80 ug via INTRAVENOUS

## 2022-04-03 MED ORDER — PHENOBARBITAL SODIUM 130 MG/ML IJ SOLN
130.0000 mg | Freq: Once | INTRAMUSCULAR | Status: AC
  Administered 2022-04-03: 130 mg via INTRAVENOUS
  Filled 2022-04-03: qty 1

## 2022-04-03 MED ORDER — LORAZEPAM 1 MG PO TABS
1.0000 mg | ORAL_TABLET | ORAL | Status: DC | PRN
  Administered 2022-04-05 (×2): 1 mg via ORAL
  Filled 2022-04-03 (×2): qty 1

## 2022-04-03 MED ORDER — SODIUM CHLORIDE 0.9 % IV SOLN
2.0000 g | INTRAVENOUS | Status: DC
  Administered 2022-04-03 – 2022-04-04 (×2): 2 g via INTRAVENOUS
  Filled 2022-04-03 (×2): qty 20

## 2022-04-03 MED ORDER — MIDAZOLAM HCL 2 MG/2ML IJ SOLN
1.0000 mg | INTRAMUSCULAR | Status: DC | PRN
  Administered 2022-04-03: 2 mg via INTRAVENOUS
  Filled 2022-04-03: qty 2

## 2022-04-03 NOTE — Procedures (Signed)
[name]  MRN: [id number]  Epilepsy Attending: Charlsie Quest  Referring Physician/Provider: Lynnell Catalan, MD  Date: 04/03/2022 Duration: 21.52 mins  Patient history: 51yo m with ams. EEG to evaluate for seizure  Level of alertness: lethargic  AEDs during EEG study: None  Technical aspects: This EEG study was done with scalp electrodes positioned according to the 10-20 International system of electrode placement. Electrical activity was reviewed with band pass filter of 1-70Hz , sensitivity of 7 uV/mm, display speed of 34mm/sec with a 60Hz  notched filter applied as appropriate. EEG data were recorded continuously and digitally stored.  Video monitoring was available and reviewed as appropriate.  Description: EEG showed continuous generalized 3 to 6 Hz theta-delta slowing. Hyperventilation and photic stimulation were not performed.     ABNORMALITY - Continuous slow, generalized  IMPRESSION: This study is suggestive of moderate diffuse encephalopathy, nonspecific etiology. No seizures or epileptiform discharges were seen throughout the recording.  Thula Stewart 

## 2022-04-03 NOTE — ED Triage Notes (Signed)
EMS called for pt slumping over into bowl of cereal at breakfast. Pt unresponsive on scene. Pt in c-collar to help maintain airway. Pt responsive to loud voice and painful stimuli. EDP at bedside.

## 2022-04-03 NOTE — Consult Note (Signed)
WOC Nurse Consult Note: Patient receiving care in [facility] ED33. Reason for Consult: RLE wounds Wound type: uncertain etiology Pressure Injury POA: Yes/No/NA Measurement: Wound bed: see photo Drainage (amount, consistency, odor)  Periwound: erythematous Dressing procedure/placement/frequency: Gently cleanse areas on RLE, pat dry. Place Xeroform gauzes [place] [phone]) over the areas and secure with kerlix. Change daily.  Monitor the wound area(s) for worsening of condition such as: Signs/symptoms of infection,  Increase in size,  Development of or worsening of odor, Development of pain, or increased pain at the affected locations.  Notify the medical team if any of these develop.  Thank you for the consult. WOC nurse will not follow at this time.  Please re-consult the WOC team if needed.  Helmut Muster, RN, MSN, CWOCN, CNS-BC, pager [phone]

## 2022-04-03 NOTE — ED Provider Notes (Signed)
[facility] EMERGENCY DEPARTMENT Provider Note   CSN: [id number] Arrival date & time: 04/03/22  1025     History  Chief Complaint  Patient presents with   Altered Mental Status    Gary Mclaughlin is a 51 y.o. male.  The history is provided by the patient and medical records. No language interpreter was used.  Altered Mental Status Presenting symptoms: unresponsiveness   Severity:  Severe Most recent episode:  Today Episode history:  Continuous Timing:  Constant Progression:  Unable to specify Chronicity:  New Associated symptoms: no agitation        Home Medications Prior to Admission medications   Medication Sig Start Date End Date Taking? Authorizing Provider  albuterol (VENTOLIN HFA) 108 (90 Base) MCG/ACT inhaler Inhale 2 puffs into the lungs every 6 (six) hours as needed for wheezing or shortness of breath. Patient not taking: Reported on 12/27/2021 10/30/21   Lanae BoastKc, Ramesh, MD  folic acid (FOLVITE) 1 MG tablet Take 1 tablet (1 mg total) by mouth daily. 12/30/21   Marguerita MerlesSheikh, Omair Latif, DO  levETIRAcetam (KEPPRA) 500 MG tablet Take 1 tablet (500 mg total) by mouth 2 (two) times daily. Patient not taking: Reported on 12/27/2021 10/30/21 11/29/21  Lanae BoastKc, Ramesh, MD  Multiple Vitamin (MULTIVITAMIN WITH MINERALS) TABS tablet Take 1 tablet by mouth daily. 12/30/21   Marguerita MerlesSheikh, Omair Latif, DO  thiamine (VITAMIN B1) 100 MG tablet Take 1 tablet (100 mg total) by mouth daily. 12/30/21   Merlene LaughterSheikh, Omair Latif, DO      Allergies    Patient has no known allergies.    Review of Systems   Review of Systems  Unable to perform ROS: Severe respiratory distress (Patient unresponsive in respiratory distress and had to be intubated upon arrival.)  Psychiatric/Behavioral:  Negative for agitation.     Physical Exam Updated Vital Signs SpO2 94% Comment: 4L Physical Exam Vitals and nursing note reviewed.  Constitutional:      General: He is in acute distress.     Appearance: He is  well-developed. He is ill-appearing. He is not toxic-appearing or diaphoretic.  HENT:     Head: Normocephalic and atraumatic.  Eyes:     Conjunctiva/sclera: Conjunctivae normal.  Cardiovascular:     Rate and Rhythm: Normal rate.     Heart sounds: No murmur heard. Pulmonary:     Effort: Respiratory distress present.     Breath sounds: Rhonchi present.  Chest:     Chest wall: No tenderness.  Abdominal:     Palpations: Abdomen is soft.     Tenderness: There is no abdominal tenderness.  Musculoskeletal:        General: No swelling or tenderness.     Cervical back: Neck supple. No tenderness.     Right lower leg: No edema.     Left lower leg: No edema.  Skin:    General: Skin is warm and dry.     Capillary Refill: Capillary refill takes less than 2 seconds.     Findings: No erythema.  Neurological:     Mental Status: He is unresponsive.     GCS: GCS eye subscore is 1. GCS verbal subscore is 1. GCS motor subscore is 1.     ED Results / Procedures / Treatments   Labs (all labs ordered are listed, but only abnormal results are displayed) Labs Reviewed  CBC WITH DIFFERENTIAL/PLATELET - Abnormal; Notable for the following components:      Result Value   RBC 4.19 (*)  Platelets 406 (*)    Basophils Absolute 0.2 (*)    All other components within normal limits  COMPREHENSIVE METABOLIC PANEL - Abnormal; Notable for the following components:   Sodium 134 (*)    Chloride 95 (*)    Glucose, Bld 109 (*)    BUN <5 (*)    Albumin 3.3 (*)    AST 106 (*)    ALT 63 (*)    Total Bilirubin 0.2 (*)    Anion gap 16 (*)    All other components within normal limits  LACTIC ACID, PLASMA - Abnormal; Notable for the following components:   Lactic Acid, Venous 2.2 (*)    All other components within normal limits  LIPASE, BLOOD - Abnormal; Notable for the following components:   Lipase 81 (*)    All other components within normal limits  ETHANOL - Abnormal; Notable for the following  components:   Alcohol, Ethyl (B) 536 (*)    All other components within normal limits  URINALYSIS, ROUTINE W REFLEX MICROSCOPIC - Abnormal; Notable for the following components:   Specific Gravity, Urine 1.004 (*)    Hgb urine dipstick MODERATE (*)    Protein, ur 30 (*)    Bacteria, UA RARE (*)    All other components within normal limits  CREATININE, SERUM - Abnormal; Notable for the following components:   Creatinine, Ser 0.52 (*)    All other components within normal limits  I-STAT ARTERIAL BLOOD GAS, ED - Abnormal; Notable for the following components:   pO2, Arterial 289 (*)    Calcium, Ion 1.13 (*)    All other components within normal limits  RESP PANEL BY RT-PCR (FLU A&B, COVID) [id number]  URINE CULTURE  CULTURE, BLOOD (ROUTINE X 2)  CULTURE, BLOOD (ROUTINE X 2)  CULTURE, RESPIRATORY W GRAM STAIN  RESPIRATORY PANEL BY PCR  RAPID URINE DRUG SCREEN, HOSP PERFORMED  TSH  AMMONIA  PROTIME-INR  LACTIC ACID, PLASMA  LEVETIRACETAM LEVEL  BLOOD GAS, ARTERIAL  BLOOD GAS, ARTERIAL  CBC  TROPONIN I (HIGH SENSITIVITY)  TROPONIN I (HIGH SENSITIVITY)    EKG EKG Interpretation  Date/Time:  Thursday April 03 2022 10:50:06 EST Ventricular Rate:  79 PR Interval:  165 QRS Duration: 109 QT Interval:  404 QTC Calculation: 464 R Axis:   84 Text Interpretation: Sinus rhythm when comapred to prior, similar appearance. No STEMI Confirmed by Theda Belfast ([id number]) on 04/03/2022 11:30:11 AM  Radiology EEG adult  Result Date: 04/03/2022 Charlsie Quest, MD     04/03/2022  2:15 PM [name] MRN: [id number] Epilepsy Attending: Charlsie Quest Referring Physician/Provider: Lynnell Catalan, MD Date: 04/03/2022 Duration: 21.52 mins Patient history: 51yo m with ams. EEG to evaluate for seizure Level of alertness: lethargic AEDs during EEG study: None Technical aspects: This EEG study was done with scalp electrodes positioned according to the 10-20 International system of  electrode placement. Electrical activity was reviewed with band pass filter of 1-70Hz , sensitivity of 7 uV/mm, display speed of 91mm/sec with a 60Hz  notched filter applied as appropriate. EEG data were recorded continuously and digitally stored.  Video monitoring was available and reviewed as appropriate. Description: EEG showed continuous generalized 3 to 6 Hz theta-delta slowing. Hyperventilation and photic stimulation were not performed.   ABNORMALITY - Continuous slow, generalized IMPRESSION: This study is suggestive of moderate diffuse encephalopathy, nonspecific etiology. No seizures or epileptiform discharges were seen throughout the recording. Priyanka   CT HEAD WO CONTRAST (Annabelle Harman)  Result  Date: 04/03/2022 CLINICAL DATA:  Mental status change, persistent or worsening EXAM: CT HEAD WITHOUT CONTRAST TECHNIQUE: Contiguous axial images were obtained from the base of the skull through the vertex without intravenous contrast. RADIATION DOSE REDUCTION: This exam was performed according to the departmental dose-optimization program which includes automated exposure control, adjustment of the mA and/or kV according to patient size and/or use of iterative reconstruction technique. COMPARISON:  10/26/2021 FINDINGS: Brain: No evidence of acute infarction, hemorrhage, hydrocephalus, extra-axial collection or mass lesion/mass effect. Vascular: No hyperdense vessel or unexpected calcification. Skull: Normal. Negative for fracture or focal lesion. Sinuses/Orbits: Mucoperiosteal thickening identified consistent with chronic pansinusitis. IMPRESSION: No acute intracranial process. Electronically Signed   By: Layla Maw M.D.   On: 04/03/2022 11:26   DG Chest Portable 1 View  Result Date: 04/03/2022 CLINICAL DATA:  Status post intubation. EXAM: PORTABLE CHEST 1 VIEW COMPARISON:  December 27, 2021. FINDINGS: The heart size and mediastinal contours are within normal limits. Endotracheal tube is in grossly good  position. Nasogastric tube is seen in the proximal stomach. Both lungs are clear. The visualized skeletal structures are unremarkable. IMPRESSION: Endotracheal and nasogastric tubes are in grossly good position. No acute cardiopulmonary abnormality seen. Electronically Signed   By: Lupita Raider M.D.   On: 04/03/2022 10:59    Procedures Procedure Name: Intubation Date/Time: 04/03/2022 3:38 PM  Performed by: Heide Scales, MDPre-anesthesia Checklist: Patient identified, Emergency Drugs available, Patient being monitored, Timeout performed and Suction available Oxygen Delivery Method: Non-rebreather mask Preoxygenation: Pre-oxygenation with 100% oxygen Induction Type: Rapid sequence Laryngoscope Size: Glidescope Grade View: Grade I Tube size: 7.5 mm Number of attempts: 1 Placement Confirmation: ETT inserted through vocal cords under direct vision, Positive ETCO2, CO2 detector and Breath sounds checked- equal and bilateral Secured at: 25 cm Tube secured with: ETT holder Dental Injury: Teeth and Oropharynx as per pre-operative assessment       CRITICAL CARE Performed by: Canary Brim Laverle Pillard Total critical care time: 45 minutes Critical care time was exclusive of separately billable procedures and treating other patients. Critical care was necessary to treat or prevent imminent or life-threatening deterioration. Critical care was time spent personally by me on the following activities: development of treatment plan with patient and/or surrogate as well as nursing, discussions with consultants, evaluation of patient's response to treatment, examination of patient, obtaining history from patient or surrogate, ordering and performing treatments and interventions, ordering and review of laboratory studies, ordering and review of radiographic studies, pulse oximetry and re-evaluation of patient's condition.    Medications Ordered in ED Medications  etomidate (AMIDATE) injection  (20 mg Intravenous Given 04/03/22 1039)  rocuronium (ZEMURON) injection (80 mg Intravenous Given 04/03/22 1040)  fentaNYL in NS (76mcg/ml) infusion-PREMIX (0 mcg/hr Intravenous Stopped 04/03/22 1134)  fentaNYL (SUBLIMAZE) bolus via infusion 50-100 mcg (has no administration in time range)  polyethylene glycol (MIRALAX / GLYCOLAX) packet 17 g (has no administration in time range)  heparin injection 5,000 Units (5,000 Units Subcutaneous Given 04/03/22 1518)  famotidine (PEPCID) tablet 20 mg (20 mg Per Tube Not Given 04/03/22 1259)  0.9 %  sodium chloride infusion ( Intravenous New Bag/Given 04/03/22 1259)  ipratropium-albuterol (DUONEB) 0.5-2.5 (3) MG/3ML nebulizer solution 3 mL (3 mLs Nebulization Given 04/03/22 1519)  ipratropium-albuterol (DUONEB) 0.5-2.5 (3) MG/3ML nebulizer solution 3 mL (has no administration in time range)  cefTRIAXone (ROCEPHIN) 2 g in sodium chloride 0.9 % 100 mL IVPB (0 g Intravenous Stopped 04/03/22 1315)  azithromycin (ZITHROMAX) 500 mg in  sodium chloride 0.9 % 250 mL IVPB (0 mg Intravenous Stopped 04/03/22 1424)  thiamine (VITAMIN B1) injection 100 mg (100 mg Intravenous Given 04/03/22 1245)  docusate (COLACE) 50 MG/5ML liquid 100 mg (has no administration in time range)  LORazepam (ATIVAN) tablet 1-4 mg (has no administration in time range)    Or  LORazepam (ATIVAN) injection 1-4 mg (has no administration in time range)  folic acid (FOLVITE) tablet 1 mg (1 mg Oral Not Given 04/03/22 1345)  multivitamin with minerals tablet 1 tablet (1 tablet Oral Not Given 04/03/22 1400)  LORazepam (ATIVAN) injection 0-4 mg ( Intravenous Not Given 04/03/22 1400)    Followed by  LORazepam (ATIVAN) injection 0-4 mg (has no administration in time range)  sodium chloride 0.9 % bolus 500 mL (has no administration in time range)  0.9 %  sodium chloride infusion (has no administration in time range)  phenylephrine (NEO-SYNEPHRINE) 20mg /NS premix infusion (25 mcg/min  Intravenous New Bag/Given 04/03/22 1514)  sodium chloride 0.9 % bolus 1,000 mL (0 mLs Intravenous Stopped 04/03/22 1148)  PHENYLephrine 80 mcg/ml in normal saline Adult IV Push Syringe (For Blood Pressure Support) (80 mcg Intravenous Given 04/03/22 1059)  PHENObarbital (LUMINAL) injection 130 mg (130 mg Intravenous Given 04/03/22 1247)  sodium chloride 0.9 % bolus 1,000 mL (0 mLs Intravenous Stopped 04/03/22 1245)    ED Course/ Medical Decision Making/ A&P                           Medical Decision Making Amount and/or Complexity of Data Reviewed Labs: ordered. Radiology: ordered.  Risk Prescription drug management. Decision regarding hospitalization.    Gary Mclaughlin is a 51 y.o. male with a past medical history significant previous subarachnoid hemorrhage, documentation of previous alcohol abuse and withdrawal, seizures, and depression who presents for altered mental status and respiratory failure.  According to EMS, patient was at a shelter and was found slumped over in a bowl of cereal with minimal breathing this morning.  There was reportedly no trauma but GCS has been 3 during transport and he has been hypoxic with oxygen saturations going into the 70s and 80s.  He has extremely rhonchorous breath sounds per EMS and despite several doses of Narcan his breathing and mental status has not improved persistently.  On arrival he comes in on a breather with oxygen saturations in the upper 80s and low 90s.  On arrival, patient is sounding like he is trying to vomit and has gurgling breath sounds.  He is not moving any extremities and is not responding to any painful stimuli.  He is not talking or opening his eyes.  GCS is 3 and he is not responding to any pain.  His lungs had coarse breath sounds bilaterally and chest and abdomen were nontender.  No evidence of external trauma.  Pupils were 4 mm and reactive bilaterally without asymmetry.  Given the patient's concern for active aspiration,  respiratory failure, and altered mental status, I do feel he needs intubation for airway protection.  Patient intubated without difficulty with RSI.  I do not see evidence of cereal debris in his airway.  Chest x-ray after intubation showed appropriate placement however patient began to become hypotensive.  He was given some phenylephrine initially that improved it.  Critical care was called and came to the patient.  Workup and return it does appear the patient is quite intoxicated with an alcohol level over 500.    His UDS was  clear and urinalysis did not show convincing evidence of UTI.  Metabolic panel showed normal kidney function and slight elevation of LFTs.     CT head showed no acute intracranial process.  Critical care will admit for further management of suspected COPD exacerbation and hypoxic respiratory failure in the setting of intoxication.  While awaiting his bed, patient self extubated in the emergency department.  He had to then be placed on nonrebreather and is now on nasal cannula.  Critical care was made aware of the self extubation and they will still admit.         Final Clinical Impression(s) / ED Diagnoses Final diagnoses:  Altered mental status, unspecified altered mental status type  Acute respiratory failure with hypoxia (HCC)     Clinical Impression: 1. Altered mental status, unspecified altered mental status type   2. Acute respiratory failure with hypoxia (HCC)     Disposition: Admit  This note was prepared with assistance of Dragon voice recognition software. Occasional wrong-word or sound-a-like substitutions may have occurred due to the inherent limitations of voice recognition software.     Guillermo Nehring, Canary Brim, MD 04/03/22 [phone]

## 2022-04-03 NOTE — H&P (Addendum)
NAME:  Gary Mclaughlin, MRN:  [id number], DOB:  10-14-1970, LOS: 0 ADMISSION DATE:  04/03/2022, CONSULTATION DATE: 12/01/2021   REFERRING MD: Emergency department physician, CHIEF COMPLAINT: Acute respiratory failure  History of Present Illness:  51 year old male who was found slumped over in the morning cereal and was transported via EMS to [facility].  Noted to be unable to protect his airway and was gurgling.  He was therefore intubated urgently by the emergency department physician.  Hemodynamically stable and will be taken to CAT scan for CT of head he has a history of subarachnoid Hemorrhage reportedly history of seizures.  Pulmonary critical.  Called to admit this back to full mechanical support will be admitted to the intensive care unit.  CT scan of the head is pending.  Pertinent  Medical History   Past Medical History:  Diagnosis Date   Depression    ETOH abuse    Hepatic steatosis 10/30/2021   Seizure (HCC)    Subarachnoid hemorrhage (HCC) 07/28/2017   Tobacco abuse 10/27/2021     Significant Hospital Events: Including procedures, antibiotic start and stop dates in addition to other pertinent events   04/03/2022 intubated for airway protection  Interim History / Subjective:  Currently intubated and in CT scanner  Objective   Blood pressure (!) 86/68, pulse 87, resp. rate 18, height 5\' 9"  (1.753 m), weight 68.5 kg, SpO2 100 %.    Vent Mode: PRVC FiO2 (%):  [100 %] 100 % Set Rate:  [18 bmp] 18 bmp Vt Set:  [560 mL] 560 mL PEEP:  [5 cmH20] 5 cmH20 Plateau Pressure:  [15 cmH20] 15 cmH20  No intake or output data in the 24 hours ending 04/03/22 1112 Filed Weights   04/03/22 1032  Weight: 68.5 kg    Examination: General: Middle-age male who is being intubated recently respiratory neuromuscular blockade HENT: Pupils are pinpoint reactive.  No JVD is appreciated Lungs: Equal bilateral breath sounds are heard currently on full mechanical ventilatory  support Cardiovascular: Heart sounds regular Abdomen: Soft nontender positive bowel sounds Extremities: 1+ edema lower extremities right mid ankle with ulcerations  Neuro: Currently with neuromuscular blockade on board GU: Amber urine with  Resolved Hospital Problem list     Assessment & Plan:   Acute respiratory failure in the setting of a history of subarachnoid hemorrhage, history of seizures, suspected EtOH abuse and possible polysubstance abuse. 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 by x-ray noted to be bronchospastic on examination Bronchodilators Steroids Empirical antimicrobial therapy for community-acquired pneumonia  Wound ostomy care for lower extremity ulcers Wound ostomy care consult   Concern for alcohol withdrawal syndrome Thiamine Folic acid May need CIWA protocol phenobarb    Best Practice (right click and "Reselect all SmartList Selections" daily)   Diet/type: NPO DVT prophylaxis: not indicated GI prophylaxis: PPI Lines: N/A Foley:  N/A Code Status:  full code Last date of multidisciplinary goals of care discussion tbd  Labs   CBC: Recent Labs  Lab 04/03/22 1033  WBC 10.0  NEUTROABS 6.9  HGB 14.0  HCT 41.9  MCV 100.0  PLT 406*    Basic Metabolic Panel: No results for input(s): "  NA", "K", "CL", "CO2", "GLUCOSE", "BUN", "CREATININE", "CALCIUM", "MG", "PHOS" in the last 168 hours. GFR: CrCl cannot be calculated (Patient's most recent lab result is older than the maximum 21 days allowed.). Recent Labs  Lab 04/03/22 1033  WBC 10.0    Liver Function  Tests: No results for input(s): "AST", "ALT", "ALKPHOS", "BILITOT", "PROT", "ALBUMIN" in the last 168 hours. No results for input(s): "LIPASE", "AMYLASE" in the last 168 hours. No results for input(s): "AMMONIA" in the last 168 hours.  ABG    Component Value Date/Time   HCO3 26.8 12/28/2021 0415   ACIDBASEDEF 0.1 12/28/2021 0415   O2SAT 98 12/28/2021 0415     Coagulation Profile: No results for input(s): "INR", "PROTIME" in the last 168 hours.  Cardiac Enzymes: No results for input(s): "CKTOTAL", "CKMB", "CKMBINDEX", "TROPONINI" in the last 168 hours.  HbA1C: Hgb A1c MFr Bld  Date/Time Value Ref Range Status  05/02/2021 04:40 PM 5.7 (H) 4.8 - 5.6 % Final    Comment:    (NOTE) Pre diabetes:          5.7%-6.4%  Diabetes:              >6.4%  Glycemic control for   <7.0% adults with diabetes     CBG: No results for input(s): "GLUCAP" in the last 168 hours.  Review of Systems:   na  Past Medical History:  He,  has a past medical history of Depression, ETOH abuse, Hepatic steatosis (10/30/2021), Seizure (HCC), Subarachnoid hemorrhage (HCC) (07/28/2017), and Tobacco abuse (10/27/2021).   Surgical History:  No past surgical history on file.   Social History:   reports that he has quit smoking. His smoking use included cigarettes. He has a 25.00 pack-year smoking history. He has never used smokeless tobacco. He reports current alcohol use. He reports that he does not use drugs.   Family History:  His family history includes Hypertension in an other family member.   Allergies No Known Allergies   Home Medications  Prior to Admission medications   Medication Sig Start Date End Date Taking? Authorizing Provider  albuterol (VENTOLIN HFA) 108 (90 Base) MCG/ACT inhaler Inhale 2 puffs into the lungs every 6 (six) hours as needed for wheezing or shortness of breath. Patient not taking: Reported on 12/27/2021 10/30/21   Lanae Boast, MD  folic acid (FOLVITE) 1 MG tablet Take 1  tablet (1 mg total) by mouth daily. 12/30/21   Marguerita Merles Latif, DO  levETIRAcetam (KEPPRA) 500 MG tablet Take 1 tablet (500 mg total) by mouth 2 (two) times daily. Patient not taking: Reported on 12/27/2021 10/30/21 11/29/21  Lanae Boast, MD  Multiple Vitamin (MULTIVITAMIN WITH MINERALS) TABS tablet Take 1 tablet by mouth daily. 12/30/21   Marguerita Merles Latif, DO  thiamine (VITAMIN B1) 100 MG tablet Take 1 tablet (100 mg total) by mouth daily. 12/30/21   Merlene Laughter, DO     Critical care time: 45 min   Brett Canales Rossi Burdo ACNP Acute Care Nurse Practitioner [facility]/Critical Care Please consult Amion 04/03/2022, 11:12 AM

## 2022-04-03 NOTE — Progress Notes (Signed)
EEG complete - results pending 

## 2022-04-03 NOTE — ED Notes (Signed)
Discussed with critical care team for sedation with pt being intubated. Sedation deferred at this time for physical exam.

## 2022-04-03 NOTE — Progress Notes (Signed)
Patient self extubated. RT and RN at bedside and placed on NRB. Sats are 100% and patient transitioned to 5L [place]. MD at bedside and made aware.

## 2022-04-03 NOTE — Progress Notes (Signed)
Patient transported to CT and back to ED 33 without complications. RN at bedside.

## 2022-04-03 NOTE — ED Notes (Addendum)
Pt noted to have ET tube at 18cm at the lip. Dr. Jackqulyn Livings EDP aware and at bedside. ETT and OG removed with EDP at bedside. RT and critical care aware. Pt able to cough and open eyes. RT asked pt his name and pt raised his middle finger to RT.

## 2022-04-03 NOTE — ED Notes (Signed)
Talked to pt at this time. Pt placed on 4L . Pt 02 saturation in the 90's. Asked pt is he had any pain. Pt stated " You're a b__ch". Critical care at bedside.

## 2022-04-04 ENCOUNTER — Inpatient Hospital Stay (HOSPITAL_COMMUNITY): Payer: Medicaid Other

## 2022-04-04 DIAGNOSIS — R4182 Altered mental status, unspecified: Secondary | ICD-10-CM

## 2022-04-04 DIAGNOSIS — F10930 Alcohol use, unspecified with withdrawal, uncomplicated: Secondary | ICD-10-CM

## 2022-04-04 DIAGNOSIS — J9601 Acute respiratory failure with hypoxia: Secondary | ICD-10-CM

## 2022-04-04 LAB — CBC
HCT: 33.2 % — ABNORMAL LOW (ref 39.0–52.0)
Hemoglobin: 11.6 g/dL — ABNORMAL LOW (ref 13.0–17.0)
MCH: 34.2 pg — ABNORMAL HIGH (ref 26.0–34.0)
MCHC: 34.9 g/dL (ref 30.0–36.0)
MCV: 97.9 fL (ref 80.0–100.0)
Platelets: 281 10*3/uL (ref 150–400)
RBC: 3.39 MIL/uL — ABNORMAL LOW (ref 4.22–5.81)
RDW: 12.7 % (ref 11.5–15.5)
WBC: 13.6 10*3/uL — ABNORMAL HIGH (ref 4.0–10.5)
nRBC: 0 % (ref 0.0–0.2)

## 2022-04-04 LAB — GLUCOSE, CAPILLARY
Glucose-Capillary: 72 mg/dL (ref 70–99)
Glucose-Capillary: 90 mg/dL (ref 70–99)
Glucose-Capillary: 99 mg/dL (ref 70–99)
Glucose-Capillary: 134 mg/dL — ABNORMAL HIGH (ref 70–99)

## 2022-04-04 LAB — BASIC METABOLIC PANEL
Anion gap: 8 (ref 5–15)
BUN: 6 mg/dL (ref 6–20)
CO2: 23 mmol/L (ref 22–32)
Calcium: 8.6 mg/dL — ABNORMAL LOW (ref 8.9–10.3)
Chloride: 105 mmol/L (ref 98–111)
Creatinine, Ser: 0.73 mg/dL (ref 0.61–1.24)
GFR, Estimated: >60 mL/min (ref >60)
Glucose, Bld: 97 mg/dL (ref 70–99)
Potassium: 4.4 mmol/L (ref 3.5–5.1)
Sodium: 136 mmol/L (ref 135–145)

## 2022-04-04 LAB — RESPIRATORY PANEL BY PCR

## 2022-04-04 LAB — LEVETIRACETAM LEVEL: Levetiracetam Lvl: <2 ug/mL — ABNORMAL LOW (ref 10.0–40.0)

## 2022-04-04 LAB — URINE CULTURE: Culture: NO GROWTH

## 2022-04-04 LAB — MAGNESIUM: Magnesium: 1.6 mg/dL — ABNORMAL LOW (ref 1.7–2.4)

## 2022-04-04 MED ORDER — PANTOPRAZOLE SODIUM 40 MG PO TBEC
40.0000 mg | DELAYED_RELEASE_TABLET | Freq: Every day | ORAL | Status: DC
  Administered 2022-04-04 – 2022-04-08 (×5): 40 mg via ORAL
  Filled 2022-04-04 (×5): qty 1

## 2022-04-04 MED ORDER — THIAMINE MONONITRATE 100 MG PO TABS
100.0000 mg | ORAL_TABLET | Freq: Every day | ORAL | Status: DC
  Administered 2022-04-05 – 2022-04-08 (×4): 100 mg via ORAL
  Filled 2022-04-04 (×4): qty 1

## 2022-04-04 MED ORDER — ORAL CARE MOUTH RINSE: 15.0000 mL | OROMUCOSAL | Status: DC | PRN

## 2022-04-04 MED ORDER — ENOXAPARIN SODIUM 40 MG/0.4ML IJ SOSY
40.0000 mg | PREFILLED_SYRINGE | INTRAMUSCULAR | Status: DC
  Administered 2022-04-04 – 2022-04-08 (×5): 40 mg via SUBCUTANEOUS
  Filled 2022-04-04 (×5): qty 0.4

## 2022-04-04 MED ORDER — PHENOBARBITAL 32.4 MG PO TABS: 64.8000 mg | ORAL_TABLET | Freq: Two times a day (BID) | ORAL | Status: DC

## 2022-04-04 MED ORDER — PHENOBARBITAL 32.4 MG PO TABS
97.2000 mg | ORAL_TABLET | Freq: Three times a day (TID) | ORAL | Status: AC
  Administered 2022-04-04 – 2022-04-06 (×6): 97.2 mg via ORAL
  Filled 2022-04-04 (×6): qty 3

## 2022-04-04 MED ORDER — ACETAMINOPHEN 325 MG PO TABS
ORAL_TABLET | ORAL | Status: AC
  Administered 2022-04-04: 650 mg via ORAL
  Filled 2022-04-04: qty 2

## 2022-04-04 MED ORDER — POLYETHYLENE GLYCOL 3350 17 G PO PACK
17.0000 g | PACK | Freq: Every day | ORAL | Status: DC
  Administered 2022-04-04 – 2022-04-07 (×3): 17 g via ORAL
  Filled 2022-04-04 (×4): qty 1

## 2022-04-04 MED ORDER — PHENOBARBITAL 32.4 MG PO TABS
32.4000 mg | ORAL_TABLET | Freq: Three times a day (TID) | ORAL | Status: DC
  Administered 2022-04-08: 32.4 mg via ORAL
  Filled 2022-04-04: qty 1

## 2022-04-04 MED ORDER — SODIUM CHLORIDE 0.9 % IV SOLN
3.0000 g | Freq: Four times a day (QID) | INTRAVENOUS | Status: AC
  Administered 2022-04-04 – 2022-04-08 (×16): 3 g via INTRAVENOUS
  Filled 2022-04-04 (×16): qty 8

## 2022-04-04 MED ORDER — ACETAMINOPHEN 325 MG PO TABS
650.0000 mg | ORAL_TABLET | Freq: Four times a day (QID) | ORAL | Status: DC | PRN
  Administered 2022-04-08 (×2): 650 mg via ORAL
  Filled 2022-04-04 (×2): qty 2

## 2022-04-04 MED ORDER — SENNOSIDES-DOCUSATE SODIUM 8.6-50 MG PO TABS
1.0000 | ORAL_TABLET | Freq: Every day | ORAL | Status: DC
  Administered 2022-04-04 – 2022-04-06 (×2): 1 via ORAL
  Filled 2022-04-04 (×2): qty 1

## 2022-04-04 MED ORDER — CHLORHEXIDINE GLUCONATE CLOTH 2 % EX PADS
6.0000 | MEDICATED_PAD | Freq: Every day | CUTANEOUS | Status: DC
  Administered 2022-04-04 – 2022-04-08 (×3): 6 via TOPICAL

## 2022-04-04 MED ORDER — PHENOBARBITAL 32.4 MG PO TABS
64.8000 mg | ORAL_TABLET | Freq: Three times a day (TID) | ORAL | Status: AC
  Administered 2022-04-06 – 2022-04-08 (×6): 64.8 mg via ORAL
  Filled 2022-04-04 (×6): qty 2

## 2022-04-04 MED ORDER — DOCUSATE SODIUM 100 MG PO CAPS: 100.0000 mg | ORAL_CAPSULE | Freq: Two times a day (BID) | ORAL | Status: DC | PRN
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
## 2022-04-04 NOTE — Progress Notes (Addendum)
Pharmacy Antibiotic Note  Gary Mclaughlin is a 51 y.o. male admitted on 04/03/2022 for empiric coverage of  aspiration pneumonia .  Pharmacy has been consulted for Unasyn dosing.  Plan: D/c'ed azithromycin and ceftriaxone. Start IV ampicillin-sulbactam 3g q6h  F/u on blood cx, renal function.  Height: 5\' 9"  (175.3 cm) Weight: 75.1 kg (165 lb 9.1 oz) IBW/kg (Calculated) : 70.7  Temp (24hrs), Avg:97.2 F (36.2 C), Min:93.6 F (34.2 C), Max:100.9 F (38.3 C)  Recent Labs  Lab 04/03/22 1033 04/03/22 1300 04/03/22 1819 04/04/22 0750  WBC 10.0  --  6.7 13.6*  CREATININE 0.66 0.52*  --  0.73  LATICACIDVEN 2.2*  --  2.1*  --     Estimated Creatinine Clearance: 109.2 mL/min (by C-G formula based on SCr of 0.73 mg/dL).    No Known Allergies  Antimicrobials this admission: Azithromycin 11/30 >>  Ceftriaxone 11/30 >>   Dose adjustments this admission:  Microbiology results: 11/30 BCx:  11/30 UCx:   11/30 MRSA PCR:   Thank you for allowing pharmacy to be a part of this patient's care.  12/30 04/04/2022 11:40 AM

## 2022-04-04 NOTE — Plan of Care (Signed)
  Problem: Education: Goal: Knowledge of General Education information will improve Description: Including pain rating scale, medication(s)/side effects and non-pharmacologic comfort measures 04/04/2022 2257 by Garret Reddish, RN Outcome: Progressing 04/04/2022 2256 by Garret Reddish, RN Outcome: Progressing   Problem: Health Behavior/Discharge Planning: Goal: Ability to manage health-related needs will improve 04/04/2022 2257 by Garret Reddish, RN Outcome: Progressing 04/04/2022 2256 by Garret Reddish, RN Outcome: Progressing   Problem: Clinical Measurements: Goal: Ability to maintain clinical measurements within normal limits will improve 04/04/2022 2257 by Garret Reddish, RN Outcome: Progressing 04/04/2022 2256 by Garret Reddish, RN Outcome: Progressing Goal: Will remain free from infection 04/04/2022 2257 by Garret Reddish, RN Outcome: Progressing 04/04/2022 2256 by Garret Reddish, RN Outcome: Progressing Goal: Diagnostic test results will improve 04/04/2022 2257 by Garret Reddish, RN Outcome: Progressing 04/04/2022 2256 by Garret Reddish, RN Outcome: Progressing Goal: Respiratory complications will improve 04/04/2022 2257 by Garret Reddish, RN Outcome: Progressing 04/04/2022 2256 by Garret Reddish, RN Outcome: Progressing Goal: Cardiovascular complication will be avoided 04/04/2022 2257 by Garret Reddish, RN Outcome: Progressing 04/04/2022 2256 by Garret Reddish, RN Outcome: Progressing   Problem: Activity: Goal: Risk for activity intolerance will decrease 04/04/2022 2257 by Garret Reddish, RN Outcome: Progressing 04/04/2022 2256 by Garret Reddish, RN Outcome: Progressing   Problem: Nutrition: Goal: Adequate nutrition will be maintained 04/04/2022 2257 by Garret Reddish, RN Outcome: Progressing 04/04/2022 2256 by Garret Reddish, RN Outcome: Progressing   Problem: Coping: Goal: Level of anxiety will decrease 04/04/2022 2257 by Garret Reddish, RN Outcome:  Progressing 04/04/2022 2256 by Garret Reddish, RN Outcome: Progressing   Problem: Elimination: Goal: Will not experience complications related to bowel motility 04/04/2022 2257 by Garret Reddish, RN Outcome: Progressing 04/04/2022 2256 by Garret Reddish, RN Outcome: Progressing Goal: Will not experience complications related to urinary retention 04/04/2022 2257 by Garret Reddish, RN Outcome: Progressing 04/04/2022 2256 by Garret Reddish, RN Outcome: Progressing   Problem: Pain Managment: Goal: General experience of comfort will improve 04/04/2022 2257 by Garret Reddish, RN Outcome: Progressing 04/04/2022 2256 by Garret Reddish, RN Outcome: Progressing   Problem: Safety: Goal: Ability to remain free from injury will improve 04/04/2022 2257 by Garret Reddish, RN Outcome: Progressing 04/04/2022 2256 by Garret Reddish, RN Outcome: Progressing   Problem: Skin Integrity: Goal: Risk for impaired skin integrity will decrease 04/04/2022 2257 by Garret Reddish, RN Outcome: Progressing 04/04/2022 2256 by Garret Reddish, RN Outcome: Progressing

## 2022-04-04 NOTE — Plan of Care (Signed)

## 2022-04-05 LAB — COMPREHENSIVE METABOLIC PANEL
ALT: 43 U/L (ref 0–44)
AST: 51 U/L — ABNORMAL HIGH (ref 15–41)
Albumin: 2.4 g/dL — ABNORMAL LOW (ref 3.5–5.0)
Alkaline Phosphatase: 94 U/L (ref 38–126)
Anion gap: 8 (ref 5–15)
BUN: 7 mg/dL (ref 6–20)
CO2: 26 mmol/L (ref 22–32)
Calcium: 9.5 mg/dL (ref 8.9–10.3)
Chloride: 100 mmol/L (ref 98–111)
Creatinine, Ser: 0.81 mg/dL (ref 0.61–1.24)
GFR, Estimated: >60 mL/min (ref >60)
Glucose, Bld: 119 mg/dL — ABNORMAL HIGH (ref 70–99)
Potassium: 3.9 mmol/L (ref 3.5–5.1)
Sodium: 134 mmol/L — ABNORMAL LOW (ref 135–145)
Total Bilirubin: 0.7 mg/dL (ref 0.3–1.2)
Total Protein: 6 g/dL — ABNORMAL LOW (ref 6.5–8.1)

## 2022-04-05 LAB — CBC WITH DIFFERENTIAL/PLATELET
Abs Immature Granulocytes: 0.05 10*3/uL (ref 0.00–0.07)
Basophils Absolute: 0.1 10*3/uL (ref 0.0–0.1)
Basophils Relative: 1 %
Eosinophils Absolute: 0.1 10*3/uL (ref 0.0–0.5)
Eosinophils Relative: 2 %
HCT: 33.3 % — ABNORMAL LOW (ref 39.0–52.0)
Hemoglobin: 11 g/dL — ABNORMAL LOW (ref 13.0–17.0)
Immature Granulocytes: 1 %
Lymphocytes Relative: 12 %
Lymphs Abs: 0.9 10*3/uL (ref 0.7–4.0)
MCH: 32.7 pg (ref 26.0–34.0)
MCHC: 33 g/dL (ref 30.0–36.0)
MCV: 99.1 fL (ref 80.0–100.0)
Monocytes Absolute: 0.5 10*3/uL (ref 0.1–1.0)
Monocytes Relative: 7 %
Neutro Abs: 5.9 10*3/uL (ref 1.7–7.7)
Neutrophils Relative %: 77 %
Platelets: 242 10*3/uL (ref 150–400)
RBC: 3.36 MIL/uL — ABNORMAL LOW (ref 4.22–5.81)
RDW: 12.5 % (ref 11.5–15.5)
WBC: 7.5 10*3/uL (ref 4.0–10.5)
nRBC: 0 % (ref 0.0–0.2)

## 2022-04-05 LAB — PROCALCITONIN: Procalcitonin: <0.1 ng/mL

## 2022-04-05 LAB — MAGNESIUM: Magnesium: 1.7 mg/dL (ref 1.7–2.4)

## 2022-04-05 MED ORDER — LEVETIRACETAM IN NACL 500 MG/100ML IV SOLN: 500.0000 mg | Freq: Two times a day (BID) | INTRAVENOUS | Status: DC

## 2022-04-05 MED ORDER — CARVEDILOL 3.125 MG PO TABS
3.1250 mg | ORAL_TABLET | Freq: Two times a day (BID) | ORAL | Status: DC
  Administered 2022-04-05 – 2022-04-08 (×7): 3.125 mg via ORAL
  Filled 2022-04-05 (×7): qty 1

## 2022-04-05 MED ORDER — LEVETIRACETAM IN NACL 1000 MG/100ML IV SOLN
1000.0000 mg | Freq: Once | INTRAVENOUS | Status: AC
  Administered 2022-04-05: 1000 mg via INTRAVENOUS
  Filled 2022-04-05: qty 100

## 2022-04-05 MED ORDER — MAGNESIUM SULFATE 2 GM/50ML IV SOLN
2.0000 g | Freq: Once | INTRAVENOUS | Status: AC
  Administered 2022-04-05: 2 g via INTRAVENOUS
  Filled 2022-04-05: qty 50

## 2022-04-05 MED ORDER — LEVETIRACETAM 500 MG PO TABS
500.0000 mg | ORAL_TABLET | Freq: Two times a day (BID) | ORAL | Status: DC
  Administered 2022-04-05 – 2022-04-08 (×6): 500 mg via ORAL
  Filled 2022-04-05 (×6): qty 1

## 2022-04-05 NOTE — Progress Notes (Signed)
PROGRESS NOTE                                                                                                                                                                                                             Patient Demographics:    Gary Mclaughlin, is a 51 y.o. male, DOB - 1970-06-09, HWK:[id number]  Outpatient Primary MD for the patient is Pcp, No    LOS - 2  Admit date - 04/03/2022    Chief Complaint  Patient presents with   Altered Mental Status       Brief Narrative (HPI from H&P)   51 year old male who was found slumped over in the morning cereal and was transported via EMS to [facility]. Noted to be unable to protect his airway and was gurgling. In the ER he was diagnosed with alcohol intoxication with failure to protect airway, he was intubated and admitted by [facility] team.   Subjective:    Gary Mclaughlin today has, No headache, No chest pain, No abdominal pain - No Nausea, No new weakness tingling or numbness, no SOB   Assessment  & Plan :    Acute alcohol intoxication causing failure to protect airway requiring brief endotracheal intubation complicated by aspiration pneumonia.  He was briefly intubated and admitted to ICU, now stabilized, started on Unasyn for aspiration pneumonia, clinically better, intoxication has resolved, CT head nonacute, mentation close to baseline.   Ongoing alcohol abuse.  Counseled to quit, on phenobarb taper and CIWA protocol.  History of seizures.  EEG stable, on Keppra continue.  Right lower extremity superficial wound present on admission.  Supportive care.  No signs of infection.  History of COPD.  At baseline.  Aspiration pneumonia.  Unasyn x 4 days.       Condition - Fair  Family Communication  :  None  Code Status :  Full  Consults  :  PCCM  PUD Prophylaxis : PPI   Procedures  :     EEG.  Nonacute.      Disposition Plan  :    Status is:  Inpatient  DVT Prophylaxis  :    enoxaparin (LOVENOX) injection 40 mg Start: 04/04/22 1400 SCDs Start: 04/03/22 1132  Lab Results  Component Value Date   PLT 242 04/05/2022    Diet :  Diet Order  Diet regular Room service appropriate? No; Fluid consistency: Thin  Diet effective now                    Inpatient Medications  Scheduled Meds:  Chlorhexidine Gluconate Cloth  6 each Topical Daily   enoxaparin (LOVENOX) injection  40 mg Subcutaneous Q24H   folic acid  1 mg Oral Daily   levETIRAcetam  500 mg Oral BID   LORazepam  0-4 mg Intravenous Q4H   Followed by   LORazepam  0-4 mg Intravenous Q8H   multivitamin with minerals  1 tablet Oral Daily   pantoprazole  40 mg Oral Daily   phenobarbital  97.2 mg Oral Q8H   Followed by   [START ON 04/06/2022] phenobarbital  64.8 mg Oral Q8H   Followed by   Melene Muller[START ON 04/08/2022] phenobarbital  32.4 mg Oral Q8H   polyethylene glycol  17 g Oral Daily   senna-docusate  1 tablet Oral QHS   thiamine  100 mg Oral Daily   Continuous Infusions:  sodium chloride     ampicillin-sulbactam (UNASYN) IV 3 g (04/05/22 0519)   magnesium sulfate bolus IVPB     sodium chloride     PRN Meds:.acetaminophen, docusate sodium, ipratropium-albuterol, LORazepam **OR** LORazepam, mouth rinse   Objective:   Vitals:   04/05/22 0000 04/05/22 0400 04/05/22 0500 04/05/22 0829  BP: (!) 156/99 (!) 154/97  (!) 140/92  Pulse: 76 70  70  Resp: 20 (!) 22  15  Temp: 98.7 F (37.1 C) 98.6 F (37 C)  98.2 F (36.8 C)  TempSrc: Oral Oral  Oral  SpO2: 95% 95%  97%  Weight:   77.3 kg   Height:        Wt Readings from Last 3 Encounters:  04/05/22 77.3 kg  01/09/22 68.5 kg  12/29/21 68.5 kg     Intake/Output Summary (Last 24 hours) at 04/05/2022 0927 Last data filed at 04/05/2022 0600 Gross per 24 hour  Intake 1383.22 ml  Output 1440 ml  Net -56.78 ml     Physical Exam  Awake Alert, No new F.N deficits, Normal  affect [place].AT,PERRAL Supple Neck, No JVD,   Symmetrical Chest wall movement, Good air movement bilaterally, CTAB RRR,No Gallops,Rubs or new Murmurs,  +ve B.Sounds, Abd Soft, No tenderness,   No Cyanosis, Clubbing or edema      Data Review:    Recent Labs  Lab 04/03/22 1033 04/03/22 1131 04/03/22 1819 04/04/22 0750 04/05/22 0155  WBC 10.0  --  6.7 13.6* 7.5  HGB 14.0 13.3 13.3 11.6* 11.0*  HCT 41.9 39.0 38.8* 33.2* 33.3*  PLT 406*  --  347 281 242  MCV 100.0  --  99.0 97.9 99.1  MCH 33.4  --  33.9 34.2* 32.7  MCHC 33.4  --  34.3 34.9 33.0  RDW 12.4  --  12.6 12.7 12.5  LYMPHSABS 1.9  --   --   --  0.9  MONOABS 0.7  --   --   --  0.5  EOSABS 0.3  --   --   --  0.1  BASOSABS 0.2*  --   --   --  0.1    Recent Labs  Lab 04/03/22 1033 04/03/22 1131 04/03/22 1300 04/03/22 1819 04/04/22 0750 04/04/22 1002 04/05/22 0152 04/05/22 0155  NA 134* 135  --   --  136  --   --  134*  K 4.5 3.7  --   --  4.4  --   --  3.9  CL 95*  --   --   --  105  --   --  100  CO2 23  --   --   --  23  --   --  26  GLUCOSE 109*  --   --   --  97  --   --  119*  BUN <5*  --   --   --  6  --   --  7  CREATININE 0.66  --  0.52*  --  0.73  --   --  0.81  AST 106*  --   --   --   --   --   --  51*  ALT 63*  --   --   --   --   --   --  43  ALKPHOS 104  --   --   --   --   --   --  94  BILITOT 0.2*  --   --   --   --   --   --  0.7  ALBUMIN 3.3*  --   --   --   --   --   --  2.4*  PROCALCITON  --   --   --   --   --   --  <0.10  --   LATICACIDVEN 2.2*  --   --  2.1*  --   --   --   --   INR 1.0  --   --   --   --   --   --   --   TSH 1.420  --   --   --   --   --   --   --   AMMONIA 24  --   --   --   --   --   --   --   MG  --   --   --   --   --  1.6* 1.7  --   CALCIUM 9.3  --   --   --  8.6*  --   --  9.5      Recent Labs  Lab 04/03/22 1033 04/03/22 1819 04/04/22 0750 04/04/22 1002 04/05/22 0152 04/05/22 0155  PROCALCITON  --   --   --   --  <0.10  --   LATICACIDVEN 2.2* 2.1*   --   --   --   --   INR 1.0  --   --   --   --   --   TSH 1.420  --   --   --   --   --   AMMONIA 24  --   --   --   --   --   MG  --   --   --  1.6* 1.7  --   CALCIUM 9.3  --  8.6*  --   --  9.5   Radiology Reports DG Chest Port 1 View  Result Date: 04/04/2022 CLINICAL DATA:  Pneumonia. EXAM: PORTABLE CHEST 1 VIEW COMPARISON:  Portable chest yesterday at 10:50 a.m. FINDINGS: 4:32 a.m. ETT/NGT interval removal. There are overlying monitor wires. The cardiac size is normal. No vascular congestion is seen. There is calcification in the aortic arch with normal mediastinal configuration. The lungs are emphysematous but clear. The sulci are sharp. Slight thoracic dextroscoliosis. IMPRESSION: No evidence of acute chest disease. COPD. Aortic atherosclerosis. Interval extubation. Electronically Signed  By: Almira Bar M.D.   On: 04/04/2022 05:43   EEG adult  Result Date: 04/03/2022 Charlsie Quest, MD     04/03/2022  2:15 PM [name] MRN: [id number] Epilepsy Attending: Charlsie Quest Referring Physician/Provider: Lynnell Catalan, MD Date: 04/03/2022 Duration: 21.52 mins Patient history: 51yo m with ams. EEG to evaluate for seizure Level of alertness: lethargic AEDs during EEG study: None Technical aspects: This EEG study was done with scalp electrodes positioned according to the 10-20 International system of electrode placement. Electrical activity was reviewed with band pass filter of 1-70Hz , sensitivity of 7 uV/mm, display speed of 85mm/sec with a 60Hz  notched filter applied as appropriate. EEG data were recorded continuously and digitally stored.  Video monitoring was available and reviewed as appropriate. Description: EEG showed continuous generalized 3 to 6 Hz theta-delta slowing. Hyperventilation and photic stimulation were not performed.   ABNORMALITY - Continuous slow, generalized IMPRESSION: This study is suggestive of moderate diffuse encephalopathy, nonspecific etiology. No  seizures or epileptiform discharges were seen throughout the recording. Priyanka   CT HEAD WO CONTRAST (Annabelle Harman)  Result Date: 04/03/2022 CLINICAL DATA:  Mental status change, persistent or worsening EXAM: CT HEAD WITHOUT CONTRAST TECHNIQUE: Contiguous axial images were obtained from the base of the skull through the vertex without intravenous contrast. RADIATION DOSE REDUCTION: This exam was performed according to the departmental dose-optimization program which includes automated exposure control, adjustment of the mA and/or kV according to patient size and/or use of iterative reconstruction technique. COMPARISON:  10/26/2021 FINDINGS: Brain: No evidence of acute infarction, hemorrhage, hydrocephalus, extra-axial collection or mass lesion/mass effect. Vascular: No hyperdense vessel or unexpected calcification. Skull: Normal. Negative for fracture or focal lesion. Sinuses/Orbits: Mucoperiosteal thickening identified consistent with chronic pansinusitis. IMPRESSION: No acute intracranial process. Electronically Signed   By: 10/28/2021 M.D.   On: 04/03/2022 11:26   DG Chest Portable 1 View  Result Date: 04/03/2022 CLINICAL DATA:  Status post intubation. EXAM: PORTABLE CHEST 1 VIEW COMPARISON:  December 27, 2021. FINDINGS: The heart size and mediastinal contours are within normal limits. Endotracheal tube is in grossly good position. Nasogastric tube is seen in the proximal stomach. Both lungs are clear. The visualized skeletal structures are unremarkable. IMPRESSION: Endotracheal and nasogastric tubes are in grossly good position. No acute cardiopulmonary abnormality seen. Electronically Signed   By: December 29, 2021 M.D.   On: 04/03/2022 10:59      Signature  -   04/05/2022 M.D on 04/05/2022 at 9:27 AM   -  To page go to [email or web address]

## 2022-04-05 NOTE — Plan of Care (Signed)

## 2022-04-06 LAB — MAGNESIUM: Magnesium: 1.7 mg/dL (ref 1.7–2.4)

## 2022-04-06 LAB — CBC WITH DIFFERENTIAL/PLATELET
Abs Immature Granulocytes: 0.07 10*3/uL (ref 0.00–0.07)
Basophils Absolute: 0.1 10*3/uL (ref 0.0–0.1)
Basophils Relative: 1 %
Eosinophils Absolute: 0.3 10*3/uL (ref 0.0–0.5)
Eosinophils Relative: 4 %
HCT: 35.6 % — ABNORMAL LOW (ref 39.0–52.0)
Hemoglobin: 12.3 g/dL — ABNORMAL LOW (ref 13.0–17.0)
Immature Granulocytes: 1 %
Lymphocytes Relative: 12 %
Lymphs Abs: 1 10*3/uL (ref 0.7–4.0)
MCH: 33.2 pg (ref 26.0–34.0)
MCHC: 34.6 g/dL (ref 30.0–36.0)
MCV: 96.2 fL (ref 80.0–100.0)
Monocytes Absolute: 0.6 10*3/uL (ref 0.1–1.0)
Monocytes Relative: 7 %
Neutro Abs: 6.5 10*3/uL (ref 1.7–7.7)
Neutrophils Relative %: 75 %
Platelets: 248 10*3/uL (ref 150–400)
RBC: 3.7 MIL/uL — ABNORMAL LOW (ref 4.22–5.81)
RDW: 11.9 % (ref 11.5–15.5)
WBC: 8.6 10*3/uL (ref 4.0–10.5)
nRBC: 0 % (ref 0.0–0.2)

## 2022-04-06 LAB — COMPREHENSIVE METABOLIC PANEL
ALT: 43 U/L (ref 0–44)
AST: 57 U/L — ABNORMAL HIGH (ref 15–41)
Albumin: 3 g/dL — ABNORMAL LOW (ref 3.5–5.0)
Alkaline Phosphatase: 98 U/L (ref 38–126)
Anion gap: 12 (ref 5–15)
BUN: <5 mg/dL — ABNORMAL LOW (ref 6–20)
CO2: 27 mmol/L (ref 22–32)
Calcium: 9.8 mg/dL (ref 8.9–10.3)
Chloride: 93 mmol/L — ABNORMAL LOW (ref 98–111)
Creatinine, Ser: 0.68 mg/dL (ref 0.61–1.24)
GFR, Estimated: >60 mL/min (ref >60)
Glucose, Bld: 104 mg/dL — ABNORMAL HIGH (ref 70–99)
Potassium: 3.3 mmol/L — ABNORMAL LOW (ref 3.5–5.1)
Sodium: 132 mmol/L — ABNORMAL LOW (ref 135–145)
Total Bilirubin: 0.7 mg/dL (ref 0.3–1.2)
Total Protein: 6.9 g/dL (ref 6.5–8.1)

## 2022-04-06 LAB — BRAIN NATRIURETIC PEPTIDE: B Natriuretic Peptide: 511 pg/mL — ABNORMAL HIGH (ref 0.0–100.0)

## 2022-04-06 LAB — PROCALCITONIN: Procalcitonin: <0.1 ng/mL

## 2022-04-06 MED ORDER — CHLORDIAZEPOXIDE HCL 5 MG PO CAPS
10.0000 mg | ORAL_CAPSULE | Freq: Three times a day (TID) | ORAL | Status: DC
  Administered 2022-04-06 – 2022-04-08 (×8): 10 mg via ORAL
  Filled 2022-04-06 (×8): qty 2

## 2022-04-06 MED ORDER — POTASSIUM CHLORIDE CRYS ER 20 MEQ PO TBCR
40.0000 meq | EXTENDED_RELEASE_TABLET | Freq: Once | ORAL | Status: AC
  Administered 2022-04-06: 40 meq via ORAL
  Filled 2022-04-06: qty 2

## 2022-04-06 MED ORDER — CLONIDINE HCL 0.2 MG PO TABS
0.2000 mg | ORAL_TABLET | Freq: Three times a day (TID) | ORAL | Status: DC
  Administered 2022-04-06 – 2022-04-08 (×9): 0.2 mg via ORAL
  Filled 2022-04-06 (×9): qty 1

## 2022-04-06 MED ORDER — MAGNESIUM SULFATE 2 GM/50ML IV SOLN
2.0000 g | Freq: Once | INTRAVENOUS | Status: AC
  Administered 2022-04-06: 2 g via INTRAVENOUS
  Filled 2022-04-06: qty 50

## 2022-04-06 NOTE — Progress Notes (Signed)
Dr. Is aware of BP and pt.s CIWA score, pt is now in soft wrist restrains an, an very mad about it, will cont. To monitor and keep Dr updated

## 2022-04-06 NOTE — Progress Notes (Addendum)
TRH night cross cover note:   I was notified by patient's RN who conveyed that this patient is confused, agitated, in active alcohol withdrawal, attempting to get out of bed and otherwise interfering w/ ongoing medical treatment, posing potential harm to themself, with these behaviors refractory to attempts at verbal redirection.  I reviewed existing noting scheduled and prn iv ativan per ciwa as well as phenobarb taper. Given the above, I placed order for soft waist restraint.   Update: patient with significant agitation. Soft waist restraint ineffective. Converted to soft b/l wrist restraints. Continuing with the above regimen for etoh w/t. Has had total 12 mg ativan thus during night shift, with requirements escalating over course.     Gary Pigg, DO Hospitalist

## 2022-04-06 NOTE — Progress Notes (Signed)
Notified DR Arlean Hopping of pt. BP and response or lack of to the ativan given for withdrawal.

## 2022-04-06 NOTE — Progress Notes (Signed)
Pt is still trying to get out of the bed to go to the store an get some cigs, have an order for soft waste belt will try that, bed is low, bed alarm is on, have verbally tried to redirect pt to stay in the bed and give the medication time to work, pt was unable to stand when he tried to get up this time will place waist belt on him if he tries to get up again, if pt is sleeping will let him sleep.

## 2022-04-06 NOTE — Progress Notes (Signed)
PROGRESS NOTE                                                                                                                                                                                                             Patient Demographics:    Gary Mclaughlin, is a 51 y.o. male, DOB - 1971-04-21, EI:[id number]  Outpatient Primary MD for the patient is Pcp, No    LOS - 3  Admit date - 04/03/2022    Chief Complaint  Patient presents with   Altered Mental Status       Brief Narrative (HPI from H&P)   51 year old male who was found slumped over in the morning cereal and was transported via EMS to [facility]. Noted to be unable to protect his airway and was gurgling. In the ER he was diagnosed with alcohol intoxication with failure to protect airway, he was intubated and admitted by [facility] team.   Subjective:   Patient in bed, appears comfortable, denies any headache, no fever, no chest pain or pressure, no shortness of breath , no abdominal pain. No focal weakness.   Assessment  & Plan :    Acute alcohol intoxication causing failure to protect airway requiring brief endotracheal intubation complicated by aspiration pneumonia.  He was briefly intubated and admitted to ICU, now stabilized, started on Unasyn for aspiration pneumonia, clinically better, intoxication has resolved, CT head nonacute, mentation close to baseline.   Ongoing alcohol abuse.  Counseled to quit, on phenobarb taper, went into DTs date of 04/05/2022, add low-dose Librium and continue with CIWA protocol.  History of seizures.  EEG stable, on Keppra continue.  Right lower extremity superficial wound present on admission.  Supportive care.  No signs of infection.  History of COPD.  At baseline.  Aspiration pneumonia.  Unasyn x 4 days.  Hypokalemia.  Replaced.       Condition - Fair  Family Communication  :  None  Code Status :   Full  Consults  :  PCCM  PUD Prophylaxis : PPI   Procedures  :     EEG.  Nonacute.      Disposition Plan  :    Status is: Inpatient  DVT Prophylaxis  :    enoxaparin (LOVENOX) injection 40 mg Start: 04/04/22 1400 SCDs Start: 04/03/22 1132  Lab Results  Component Value Date   PLT 248 04/06/2022    Diet :  Diet Order             Diet regular Room service appropriate? No; Fluid consistency: Thin  Diet effective now                    Inpatient Medications  Scheduled Meds:  carvedilol  3.125 mg Oral BID WC   chlordiazePOXIDE  10 mg Oral TID   Chlorhexidine Gluconate Cloth  6 each Topical Daily   cloNIDine  0.2 mg Oral TID   enoxaparin (LOVENOX) injection  40 mg Subcutaneous Q24H   folic acid  1 mg Oral Daily   levETIRAcetam  500 mg Oral BID   LORazepam  0-4 mg Intravenous Q8H   multivitamin with minerals  1 tablet Oral Daily   pantoprazole  40 mg Oral Daily   phenobarbital  64.8 mg Oral Q8H   Followed by   Melene Muller ON 04/08/2022] phenobarbital  32.4 mg Oral Q8H   polyethylene glycol  17 g Oral Daily   senna-docusate  1 tablet Oral QHS   thiamine  100 mg Oral Daily   Continuous Infusions:  sodium chloride     ampicillin-sulbactam (UNASYN) IV 3 g (04/06/22 0545)   sodium chloride     PRN Meds:.acetaminophen, docusate sodium, ipratropium-albuterol, [DISCONTINUED] LORazepam **OR** LORazepam, mouth rinse   Objective:   Vitals:   04/06/22 0729 04/06/22 0800 04/06/22 0814 04/06/22 0922  BP: (!) 162/103 (!) 181/104  (!) 136/99  Pulse: 65 63 65 65  Resp: (!) 24     Temp: 97.6 F (36.4 C)     TempSrc: Oral     SpO2: 91%     Weight:      Height:        Wt Readings from Last 3 Encounters:  04/06/22 77.3 kg  01/09/22 68.5 kg  12/29/21 68.5 kg     Intake/Output Summary (Last 24 hours) at 04/06/2022 1039 Last data filed at 04/06/2022 0515 Gross per 24 hour  Intake 840 ml  Output 3025 ml  Net -2185 ml     Physical Exam  Awake, mildly confused,  No new F.N deficits, mild DTs [place].AT,PERRAL Supple Neck, No JVD,   Symmetrical Chest wall movement, Good air movement bilaterally, CTAB RRR,No Gallops, Rubs or new Murmurs,  +ve B.Sounds, Abd Soft, No tenderness,   No Cyanosis, Clubbing or edema       Data Review:    Recent Labs  Lab 04/03/22 1033 04/03/22 1131 04/03/22 1819 04/04/22 0750 04/05/22 0155 04/06/22 0221  WBC 10.0  --  6.7 13.6* 7.5 8.6  HGB 14.0 13.3 13.3 11.6* 11.0* 12.3*  HCT 41.9 39.0 38.8* 33.2* 33.3* 35.6*  PLT 406*  --  347 281 242 248  MCV 100.0  --  99.0 97.9 99.1 96.2  MCH 33.4  --  33.9 34.2* 32.7 33.2  MCHC 33.4  --  34.3 34.9 33.0 34.6  RDW 12.4  --  12.6 12.7 12.5 11.9  LYMPHSABS 1.9  --   --   --  0.9 1.0  MONOABS 0.7  --   --   --  0.5 0.6  EOSABS 0.3  --   --   --  0.1 0.3  BASOSABS 0.2*  --   --   --  0.1 0.1    Recent Labs  Lab 04/03/22 1033 04/03/22 1131 04/03/22 1300 04/03/22 1819 04/04/22 0750 04/04/22 1002 04/05/22 0152 04/05/22 0155 04/06/22 0221  NA 134* 135  --   --  136  --   --  134* 132*  K 4.5 3.7  --   --  4.4  --   --  3.9 3.3*  CL 95*  --   --   --  105  --   --  100 93*  CO2 23  --   --   --  23  --   --  26 27  GLUCOSE 109*  --   --   --  97  --   --  119* 104*  BUN <5*  --   --   --  6  --   --  7 <5*  CREATININE 0.66  --  0.52*  --  0.73  --   --  0.81 0.68  AST 106*  --   --   --   --   --   --  51* 57*  ALT 63*  --   --   --   --   --   --  43 43  ALKPHOS 104  --   --   --   --   --   --  94 98  BILITOT 0.2*  --   --   --   --   --   --  0.7 0.7  ALBUMIN 3.3*  --   --   --   --   --   --  2.4* 3.0*  PROCALCITON  --   --   --   --   --   --  <0.10  --  <0.10  LATICACIDVEN 2.2*  --   --  2.1*  --   --   --   --   --   INR 1.0  --   --   --   --   --   --   --   --   TSH 1.420  --   --   --   --   --   --   --   --   AMMONIA 24  --   --   --   --   --   --   --   --   BNP  --   --   --   --   --   --   --   --  511.0*  MG  --   --   --   --   --  1.6* 1.7   --  1.7  CALCIUM 9.3  --   --   --  8.6*  --   --  9.5 9.8      Recent Labs  Lab 04/03/22 1033 04/03/22 1819 04/04/22 0750 04/04/22 1002 04/05/22 0152 04/05/22 0155 04/06/22 0221  PROCALCITON  --   --   --   --  <0.10  --  <0.10  LATICACIDVEN 2.2* 2.1*  --   --   --   --   --   INR 1.0  --   --   --   --   --   --   TSH 1.420  --   --   --   --   --   --   AMMONIA 24  --   --   --   --   --   --   BNP  --   --   --   --   --   --  511.0*  MG  --   --   --  1.6* 1.7  --  1.7  CALCIUM 9.3  --  8.6*  --   --  9.5 9.8   Radiology Reports DG Chest Port 1 View  Result Date: 04/04/2022 CLINICAL DATA:  Pneumonia. EXAM: PORTABLE CHEST 1 VIEW COMPARISON:  Portable chest yesterday at 10:50 a.m. FINDINGS: 4:32 a.m. ETT/NGT interval removal. There are overlying monitor wires. The cardiac size is normal. No vascular congestion is seen. There is calcification in the aortic arch with normal mediastinal configuration. The lungs are emphysematous but clear. The sulci are sharp. Slight thoracic dextroscoliosis. IMPRESSION: No evidence of acute chest disease. COPD. Aortic atherosclerosis. Interval extubation. Electronically Signed   By: Telford Nab M.D.   On: 04/04/2022 05:43   EEG adult  Result Date: 04/03/2022 Lora Havens, MD     04/03/2022  2:15 PM Patient Name: Krystopher Shortsleeve MRN: [id number] Epilepsy Attending: Lora Havens Referring Physician/Provider: Kipp Brood, MD Date: 04/03/2022 Duration: 21.52 mins Patient history: 51yo m with ams. EEG to evaluate for seizure Level of alertness: lethargic AEDs during EEG study: None Technical aspects: This EEG study was done with scalp electrodes positioned according to the 10-20 International system of electrode placement. Electrical activity was reviewed with band pass filter of 1-70Hz , sensitivity of 7 uV/mm, display speed of 62mm/sec with a 60Hz  notched filter applied as appropriate. EEG data were recorded continuously and digitally stored.   Video monitoring was available and reviewed as appropriate. Description: EEG showed continuous generalized 3 to 6 Hz theta-delta slowing. Hyperventilation and photic stimulation were not performed.   ABNORMALITY - Continuous slow, generalized IMPRESSION: This study is suggestive of moderate diffuse encephalopathy, nonspecific etiology. No seizures or epileptiform discharges were seen throughout the recording. Priyanka Barbra Sarks   CT HEAD WO CONTRAST (5MM)  Result Date: 04/03/2022 CLINICAL DATA:  Mental status change, persistent or worsening EXAM: CT HEAD WITHOUT CONTRAST TECHNIQUE: Contiguous axial images were obtained from the base of the skull through the vertex without intravenous contrast. RADIATION DOSE REDUCTION: This exam was performed according to the departmental dose-optimization program which includes automated exposure control, adjustment of the mA and/or kV according to patient size and/or use of iterative reconstruction technique. COMPARISON:  10/26/2021 FINDINGS: Brain: No evidence of acute infarction, hemorrhage, hydrocephalus, extra-axial collection or mass lesion/mass effect. Vascular: No hyperdense vessel or unexpected calcification. Skull: Normal. Negative for fracture or focal lesion. Sinuses/Orbits: Mucoperiosteal thickening identified consistent with chronic pansinusitis. IMPRESSION: No acute intracranial process. Electronically Signed   By: Sammie Bench M.D.   On: 04/03/2022 11:26   DG Chest Portable 1 View  Result Date: 04/03/2022 CLINICAL DATA:  Status post intubation. EXAM: PORTABLE CHEST 1 VIEW COMPARISON:  December 27, 2021. FINDINGS: The heart size and mediastinal contours are within normal limits. Endotracheal tube is in grossly good position. Nasogastric tube is seen in the proximal stomach. Both lungs are clear. The visualized skeletal structures are unremarkable. IMPRESSION: Endotracheal and nasogastric tubes are in grossly good position. No acute cardiopulmonary  abnormality seen. Electronically Signed   By: Marijo Conception M.D.   On: 04/03/2022 10:59      Signature  -   Lala Lund M.D on 04/06/2022 at 10:39 AM   -  To page go to [email or web address]

## 2022-04-07 LAB — CBC WITH DIFFERENTIAL/PLATELET
Abs Immature Granulocytes: 0.03 10*3/uL (ref 0.00–0.07)
Basophils Absolute: 0.1 10*3/uL (ref 0.0–0.1)
Basophils Relative: 1 %
Eosinophils Absolute: 0.3 10*3/uL (ref 0.0–0.5)
Eosinophils Relative: 5 %
HCT: 36.9 % — ABNORMAL LOW (ref 39.0–52.0)
Hemoglobin: 13 g/dL (ref 13.0–17.0)
Immature Granulocytes: 1 %
Lymphocytes Relative: 17 %
Lymphs Abs: 1 10*3/uL (ref 0.7–4.0)
MCH: 33.9 pg (ref 26.0–34.0)
MCHC: 35.2 g/dL (ref 30.0–36.0)
MCV: 96.3 fL (ref 80.0–100.0)
Monocytes Absolute: 0.6 10*3/uL (ref 0.1–1.0)
Monocytes Relative: 10 %
Neutro Abs: 4.3 10*3/uL (ref 1.7–7.7)
Neutrophils Relative %: 66 %
Platelets: 263 10*3/uL (ref 150–400)
RBC: 3.83 MIL/uL — ABNORMAL LOW (ref 4.22–5.81)
RDW: 12.1 % (ref 11.5–15.5)
WBC: 6.3 10*3/uL (ref 4.0–10.5)
nRBC: 0 % (ref 0.0–0.2)

## 2022-04-07 LAB — COMPREHENSIVE METABOLIC PANEL
ALT: 44 U/L (ref 0–44)
AST: 54 U/L — ABNORMAL HIGH (ref 15–41)
Albumin: 2.8 g/dL — ABNORMAL LOW (ref 3.5–5.0)
Alkaline Phosphatase: 99 U/L (ref 38–126)
Anion gap: 7 (ref 5–15)
BUN: 10 mg/dL (ref 6–20)
CO2: 28 mmol/L (ref 22–32)
Calcium: 9.2 mg/dL (ref 8.9–10.3)
Chloride: 98 mmol/L (ref 98–111)
Creatinine, Ser: 0.86 mg/dL (ref 0.61–1.24)
GFR, Estimated: >60 mL/min (ref >60)
Glucose, Bld: 125 mg/dL — ABNORMAL HIGH (ref 70–99)
Potassium: 3.7 mmol/L (ref 3.5–5.1)
Sodium: 133 mmol/L — ABNORMAL LOW (ref 135–145)
Total Bilirubin: 0.5 mg/dL (ref 0.3–1.2)
Total Protein: 6.8 g/dL (ref 6.5–8.1)

## 2022-04-07 LAB — BRAIN NATRIURETIC PEPTIDE: B Natriuretic Peptide: 179.5 pg/mL — ABNORMAL HIGH (ref 0.0–100.0)

## 2022-04-07 LAB — MAGNESIUM: Magnesium: 2 mg/dL (ref 1.7–2.4)

## 2022-04-07 LAB — PROCALCITONIN: Procalcitonin: <0.1 ng/mL

## 2022-04-07 MED ORDER — LORAZEPAM 2 MG/ML IJ SOLN: 0.0000 mg | Freq: Three times a day (TID) | INTRAMUSCULAR | Status: DC

## 2022-04-07 NOTE — Progress Notes (Signed)
PROGRESS NOTE                                                                                                                                                                                                             Patient Demographics:    Gary Mclaughlin, is a 51 y.o. male, DOB - 1971-03-30, OBS:[id number]  Outpatient Primary MD for the patient is Pcp, No    LOS - 4  Admit date - 04/03/2022    Chief Complaint  Patient presents with   Altered Mental Status       Brief Narrative (HPI from H&P)   51 year old male who was found slumped over in the morning cereal and was transported via EMS to [facility]. Noted to be unable to protect his airway and was gurgling. In the ER he was diagnosed with alcohol intoxication with failure to protect airway, he was intubated and admitted by [facility] team.   Subjective:   Patient in bed, appears comfortable, denies any headache, no fever, no chest pain or pressure, no shortness of breath , no abdominal pain. No new focal weakness.   Assessment  & Plan :    Acute alcohol intoxication causing failure to protect airway requiring brief endotracheal intubation complicated by aspiration pneumonia.  He was briefly intubated and admitted to ICU, now stabilized, started on Unasyn for aspiration pneumonia, clinically better, intoxication has resolved, CT head nonacute, mentation close to baseline.   Ongoing alcohol abuse.  Counseled to quit, on phenobarb taper, went into DTs date of 04/05/2022, add low-dose Librium and continue with CIWA protocol.  History of seizures.  EEG stable, on Keppra continue.  Right lower extremity superficial wound present on admission.  Supportive care.  No signs of infection.  History of COPD.  At baseline.  Aspiration pneumonia.  Unasyn x 4 days.  Hypokalemia.  Replaced.       Condition - Fair  Family Communication  :  None  Code Status :   Full  Consults  :  PCCM  PUD Prophylaxis : PPI   Procedures  :     EEG.  Nonacute.      Disposition Plan  :    Status is: Inpatient  DVT Prophylaxis  :    enoxaparin (LOVENOX) injection 40 mg Start: 04/04/22 1400 SCDs Start: 04/03/22 1132  Lab Results  Component Value Date   PLT 263 04/07/2022    Diet :  Diet Order             Diet regular Room service appropriate? No; Fluid consistency: Thin  Diet effective now                    Inpatient Medications  Scheduled Meds:  carvedilol  3.125 mg Oral BID WC   chlordiazePOXIDE  10 mg Oral TID   Chlorhexidine Gluconate Cloth  6 each Topical Daily   cloNIDine  0.2 mg Oral TID   enoxaparin (LOVENOX) injection  40 mg Subcutaneous Q24H   folic acid  1 mg Oral Daily   levETIRAcetam  500 mg Oral BID   LORazepam  0-4 mg Intravenous Q8H   multivitamin with minerals  1 tablet Oral Daily   pantoprazole  40 mg Oral Daily   phenobarbital  64.8 mg Oral Q8H   Followed by   Melene Muller[START ON 04/08/2022] phenobarbital  32.4 mg Oral Q8H   polyethylene glycol  17 g Oral Daily   senna-docusate  1 tablet Oral QHS   thiamine  100 mg Oral Daily   Continuous Infusions:  sodium chloride     ampicillin-sulbactam (UNASYN) IV 3 g (04/07/22 0604)   sodium chloride     PRN Meds:.acetaminophen, docusate sodium, ipratropium-albuterol, mouth rinse   Objective:   Vitals:   04/06/22 2000 04/06/22 2214 04/07/22 0000 04/07/22 0800  BP: (!) 108/96 (!) 128/96 (!) 146/101 126/85  Pulse: 76  68 79  Resp: (!) 23  17 15   Temp: 97.9 F (36.6 C)  (!) 97.5 F (36.4 C) 98.3 F (36.8 C)  TempSrc: Oral  Axillary Oral  SpO2: 97%  94% 90%  Weight:      Height:        Wt Readings from Last 3 Encounters:  04/06/22 77.3 kg  01/09/22 68.5 kg  12/29/21 68.5 kg     Intake/Output Summary (Last 24 hours) at 04/07/2022 1104 Last data filed at 04/06/2022 1817 Gross per 24 hour  Intake --  Output 175 ml  Net -175 ml     Physical Exam  Awake,  mildly confused, No new F.N deficits, mild DTs [place].AT,PERRAL Supple Neck, No JVD,   Symmetrical Chest wall movement, Good air movement bilaterally, CTAB RRR,No Gallops, Rubs or new Murmurs,  +ve B.Sounds, Abd Soft, No tenderness,   No Cyanosis, Clubbing or edema       Data Review:    Recent Labs  Lab 04/03/22 1033 04/03/22 1131 04/03/22 1819 04/04/22 0750 04/05/22 0155 04/06/22 0221 04/07/22 0827  WBC 10.0  --  6.7 13.6* 7.5 8.6 6.3  HGB 14.0   < > 13.3 11.6* 11.0* 12.3* 13.0  HCT 41.9   < > 38.8* 33.2* 33.3* 35.6* 36.9*  PLT 406*  --  347 281 242 248 263  MCV 100.0  --  99.0 97.9 99.1 96.2 96.3  MCH 33.4  --  33.9 34.2* 32.7 33.2 33.9  MCHC 33.4  --  34.3 34.9 33.0 34.6 35.2  RDW 12.4  --  12.6 12.7 12.5 11.9 12.1  LYMPHSABS 1.9  --   --   --  0.9 1.0 1.0  MONOABS 0.7  --   --   --  0.5 0.6 0.6  EOSABS 0.3  --   --   --  0.1 0.3 0.3  BASOSABS 0.2*  --   --   --  0.1 0.1 0.1   < > =  values in this interval not displayed.    Recent Labs  Lab 04/03/22 1033 04/03/22 1131 04/03/22 1300 04/03/22 1819 04/04/22 0750 04/04/22 1002 04/05/22 0152 04/05/22 0155 04/06/22 0221 04/07/22 0827  NA 134* 135  --   --  136  --   --  134* 132* 133*  K 4.5 3.7  --   --  4.4  --   --  3.9 3.3* 3.7  CL 95*  --   --   --  105  --   --  100 93* 98  CO2 23  --   --   --  23  --   --  26 27 28   GLUCOSE 109*  --   --   --  97  --   --  119* 104* 125*  BUN <5*  --   --   --  6  --   --  7 <5* 10  CREATININE 0.66  --  0.52*  --  0.73  --   --  0.81 0.68 0.86  AST 106*  --   --   --   --   --   --  51* 57* 54*  ALT 63*  --   --   --   --   --   --  43 43 44  ALKPHOS 104  --   --   --   --   --   --  94 98 99  BILITOT 0.2*  --   --   --   --   --   --  0.7 0.7 0.5  ALBUMIN 3.3*  --   --   --   --   --   --  2.4* 3.0* 2.8*  PROCALCITON  --   --   --   --   --   --  <0.10  --  <0.10  --   LATICACIDVEN 2.2*  --   --  2.1*  --   --   --   --   --   --   INR 1.0  --   --   --   --   --   --    --   --   --   TSH 1.420  --   --   --   --   --   --   --   --   --   AMMONIA 24  --   --   --   --   --   --   --   --   --   BNP  --   --   --   --   --   --   --   --  511.0* 179.5*  MG  --   --   --   --   --  1.6* 1.7  --  1.7 2.0  CALCIUM 9.3  --   --   --  8.6*  --   --  9.5 9.8 9.2      Recent Labs  Lab 04/03/22 1033 04/03/22 1819 04/04/22 0750 04/04/22 1002 04/05/22 0152 04/05/22 0155 04/06/22 0221 04/07/22 0827  PROCALCITON  --   --   --   --  <0.10  --  <0.10  --   LATICACIDVEN 2.2* 2.1*  --   --   --   --   --   --   INR 1.0  --   --   --   --   --   --   --  TSH 1.420  --   --   --   --   --   --   --   AMMONIA 24  --   --   --   --   --   --   --   BNP  --   --   --   --   --   --  511.0* 179.5*  MG  --   --   --  1.6* 1.7  --  1.7 2.0  CALCIUM 9.3  --  8.6*  --   --  9.5 9.8 9.2   Radiology Reports DG Chest Port 1 View  Result Date: 04/04/2022 CLINICAL DATA:  Pneumonia. EXAM: PORTABLE CHEST 1 VIEW COMPARISON:  Portable chest yesterday at 10:50 a.m. FINDINGS: 4:32 a.m. ETT/NGT interval removal. There are overlying monitor wires. The cardiac size is normal. No vascular congestion is seen. There is calcification in the aortic arch with normal mediastinal configuration. The lungs are emphysematous but clear. The sulci are sharp. Slight thoracic dextroscoliosis. IMPRESSION: No evidence of acute chest disease. COPD. Aortic atherosclerosis. Interval extubation. Electronically Signed   By: Almira Bar M.D.   On: 04/04/2022 05:43   EEG adult  Result Date: 04/03/2022 Charlsie Quest, MD     04/03/2022  2:15 PM [name] MRN: [id number] Epilepsy Attending: Charlsie Quest Referring Physician/Provider: Lynnell Catalan, MD Date: 04/03/2022 Duration: 21.52 mins Patient history: 51yo m with ams. EEG to evaluate for seizure Level of alertness: lethargic AEDs during EEG study: None Technical aspects: This EEG study was done with scalp electrodes positioned  according to the 10-20 International system of electrode placement. Electrical activity was reviewed with band pass filter of 1-70Hz , sensitivity of 7 uV/mm, display speed of 64mm/sec with a 60Hz  notched filter applied as appropriate. EEG data were recorded continuously and digitally stored.  Video monitoring was available and reviewed as appropriate. Description: EEG showed continuous generalized 3 to 6 Hz theta-delta slowing. Hyperventilation and photic stimulation were not performed.   ABNORMALITY - Continuous slow, generalized IMPRESSION: This study is suggestive of moderate diffuse encephalopathy, nonspecific etiology. No seizures or epileptiform discharges were seen throughout the recording. Priyanka   CT HEAD WO CONTRAST (Annabelle Gary)  Result Date: 04/03/2022 CLINICAL DATA:  Mental status change, persistent or worsening EXAM: CT HEAD WITHOUT CONTRAST TECHNIQUE: Contiguous axial images were obtained from the base of the skull through the vertex without intravenous contrast. RADIATION DOSE REDUCTION: This exam was performed according to the departmental dose-optimization program which includes automated exposure control, adjustment of the mA and/or kV according to patient size and/or use of iterative reconstruction technique. COMPARISON:  10/26/2021 FINDINGS: Brain: No evidence of acute infarction, hemorrhage, hydrocephalus, extra-axial collection or mass lesion/mass effect. Vascular: No hyperdense vessel or unexpected calcification. Skull: Normal. Negative for fracture or focal lesion. Sinuses/Orbits: Mucoperiosteal thickening identified consistent with chronic pansinusitis. IMPRESSION: No acute intracranial process. Electronically Signed   By: 10/28/2021 M.D.   On: 04/03/2022 11:26      Signature  -   04/05/2022 M.D on 04/07/2022 at 11:04 AM   -  To page go to [email or web address]

## 2022-04-07 NOTE — TOC Initial Note (Addendum)
Transition of Care [facility]) - Initial/Assessment Note    Patient Details  Name: Gary Mclaughlin MRN: [id number] Date of Birth: [birth year]  Transition [facility]) CM/SW Contact:    Mearl Latin, LCSW Phone Number: 04/07/2022, 1:15 PM  Clinical Narrative:                 CSW spoke with patient. He reported he has been staying in a camp across from [employer] and will return there at discharge. He reported being aware of [place] nights at the [facility] if needed. He is requesting resources for ETOH cessation. He reported drinking 3 40s per day. He feels he cannot go without it or he goes into withdrawal but he feels bad on it at the same time. CSW encouraged him to pursue treatment as he reported he went 9 years sober at one point. He reported he works in [employer] when he can and receives [employer]. He walks everywhere unless his boss gives him a ride. He requested anxiety medications for discharge. CSW advised him that the hospital MD would prescribe meds as recommended but that he should follow up with a PCP for maintenance meds. He is agreeable to PCP clinic appointment.   Expected Discharge Plan: Home/Self Care Barriers to Discharge: Continued Medical Work up   Patient Goals and CMS Choice Patient states their goals for this hospitalization and ongoing recovery are:: Feel better CMS Medicare.gov Compare Post Acute Care list provided to:: Patient Choice offered to / list presented to : Patient  Expected Discharge Plan and Services Expected Discharge Plan: Home/Self Care In-house Referral: Clinical Social Work     Living arrangements for the past 2 months: Homeless                                      Prior Living Arrangements/Services Living arrangements for the past 2 months: Homeless Lives with:: Self Patient language and need for interpreter reviewed:: Yes Do you feel safe going back to the place where you live?: Yes      Need for Family Participation in  Patient Care: No (Comment) Care giver support system in place?: No (comment)   Criminal Activity/Legal Involvement Pertinent to Current Situation/Hospitalization: No - Comment as needed  Activities of Daily Living Home Assistive Devices/Equipment: None ADL Screening (condition at time of admission) Patient's cognitive ability adequate to safely complete daily activities?: Yes Is the patient deaf or have difficulty hearing?: No Does the patient have difficulty seeing, even when wearing glasses/contacts?: No Does the patient have difficulty concentrating, remembering, or making decisions?: No Patient able to express need for assistance with ADLs?: Yes Does the patient have difficulty dressing or bathing?: No Independently performs ADLs?: Yes (appropriate for developmental age) Does the patient have difficulty walking or climbing stairs?: No Weakness of Legs: None Weakness of Arms/Hands: None  Permission Sought/Granted Permission sought to share information with : Facility Medical sales representative                Emotional Assessment Appearance:: Appears stated age Attitude/Demeanor/Rapport: Engaged Affect (typically observed): Accepting, Appropriate, Pleasant Orientation: : Oriented to Self, Oriented to Place, Oriented to  Time, Oriented to Situation Alcohol / Substance Use: Alcohol Use Psych Involvement: No (comment)  Admission diagnosis:  Community acquired pneumonia [J18.9] Acute respiratory failure with hypoxia (HCC) [J96.01] Altered mental status, unspecified altered mental status type [R41.82] Patient Active Problem List   Diagnosis Date  Noted   Altered mental status 04/04/2022   Chest pain 12/28/2021   COPD with acute exacerbation (HCC) 12/28/2021   Hepatic steatosis 10/30/2021   Transaminitis 10/30/2021   Acute respiratory failure with hypoxia (HCC) 10/27/2021   Alcohol withdrawal (HCC) 10/27/2021   Seizure disorder (HCC) 10/27/2021   Tobacco abuse 10/27/2021    Subarachnoid hemorrhage (HCC) 07/28/2017   SAH (subarachnoid hemorrhage) (HCC) 07/27/2017   Alcohol intoxication (HCC) 07/27/2017   Normocytic normochromic anemia 07/27/2017   Medial epicondylitis of right elbow 04/01/2015   Alcohol dependence (HCC) 05/01/2014   PCP:  Pcp, No Pharmacy:   [facility] [address] [place] [address] Phone: [phone] Fax: [phone]     Social Determinants of Health (SDOH) Interventions    Readmission Risk Interventions     No data to display

## 2022-04-07 NOTE — TOC Progression Note (Signed)
Transition of Care [facility]) - Progression Note    Patient Details  Name: [name] MRN: [id number] Date of Birth: 09-Dec-1970  [facility] Care [facility]) CM/SW Contact  Tom-Johnson, Hershal Coria, RN Phone Number: 04/07/2022, 4:13 PM  Clinical Narrative:     Hospital f/u/New Patient Establishment scheduled with Internal Medicine, info on AVS. Patient had MATCH done in June of this year so not qualified for [facility] at this time.   Expected Discharge Plan: Home/Self Care Barriers to Discharge: Continued Medical Work up  Expected Discharge Plan and Services Expected Discharge Plan: Home/Self Care In-house Referral: Clinical Social Work     Living arrangements for the past 2 months: Homeless                                       Social Determinants of Health (SDOH) Interventions    Readmission Risk Interventions     No data to display

## 2022-04-08 ENCOUNTER — Other Ambulatory Visit (HOSPITAL_COMMUNITY): Payer: Self-pay

## 2022-04-08 ENCOUNTER — Encounter (HOSPITAL_COMMUNITY): Payer: Self-pay | Admitting: Pulmonary Disease

## 2022-04-08 ENCOUNTER — Other Ambulatory Visit: Payer: Self-pay

## 2022-04-08 LAB — CULTURE, BLOOD (ROUTINE X 2)
Culture: NO GROWTH
Culture: NO GROWTH
Special Requests: ADEQUATE

## 2022-04-08 MED ORDER — ADULT MULTIVITAMIN W/MINERALS CH
1.0000 | ORAL_TABLET | Freq: Every day | ORAL | 0 refills | Status: DC
  Filled 2022-04-08: qty 30, 30d supply, fill #0

## 2022-04-08 MED ORDER — TRAMADOL HCL 50 MG PO TABS
50.0000 mg | ORAL_TABLET | Freq: Once | ORAL | Status: AC
  Administered 2022-04-08: 50 mg via ORAL
  Filled 2022-04-08: qty 1

## 2022-04-08 MED ORDER — CARVEDILOL 3.125 MG PO TABS
3.1250 mg | ORAL_TABLET | Freq: Two times a day (BID) | ORAL | 0 refills | Status: DC
  Filled 2022-04-08: qty 60, 30d supply, fill #0

## 2022-04-08 MED ORDER — CARVEDILOL 6.25 MG PO TABS
6.2500 mg | ORAL_TABLET | Freq: Two times a day (BID) | ORAL | Status: DC
  Administered 2022-04-08: 6.25 mg via ORAL
  Filled 2022-04-08: qty 1

## 2022-04-08 MED ORDER — ALBUTEROL SULFATE HFA 108 (90 BASE) MCG/ACT IN AERS
2.0000 | INHALATION_SPRAY | Freq: Four times a day (QID) | RESPIRATORY_TRACT | 0 refills | Status: DC | PRN
  Filled 2022-04-08: qty 6.7, 25d supply, fill #0

## 2022-04-08 MED ORDER — CARVEDILOL 3.125 MG PO TABS
3.1250 mg | ORAL_TABLET | Freq: Once | ORAL | Status: AC
  Administered 2022-04-08: 3.125 mg via ORAL
  Filled 2022-04-08: qty 1

## 2022-04-08 MED ORDER — LEVETIRACETAM 500 MG PO TABS
500.0000 mg | ORAL_TABLET | Freq: Two times a day (BID) | ORAL | 0 refills | Status: DC
  Filled 2022-04-08: qty 60, 30d supply, fill #0

## 2022-04-08 MED ORDER — THIAMINE HCL 100 MG PO TABS
100.0000 mg | ORAL_TABLET | Freq: Every day | ORAL | 0 refills | Status: DC
  Filled 2022-04-08: qty 30, 30d supply, fill #0

## 2022-04-08 MED ORDER — HYDRALAZINE HCL 20 MG/ML IJ SOLN
10.0000 mg | Freq: Four times a day (QID) | INTRAMUSCULAR | Status: DC | PRN
  Filled 2022-04-08: qty 1

## 2022-04-08 MED ORDER — FOLIC ACID 1 MG PO TABS
1.0000 mg | ORAL_TABLET | Freq: Every day | ORAL | 0 refills | Status: DC
  Filled 2022-04-08: qty 30, 30d supply, fill #0

## 2022-04-08 MED ORDER — PANTOPRAZOLE SODIUM 40 MG PO TBEC
40.0000 mg | DELAYED_RELEASE_TABLET | Freq: Every day | ORAL | 0 refills | Status: DC
  Filled 2022-04-08: qty 30, 30d supply, fill #0

## 2022-04-08 MED ORDER — KETOROLAC TROMETHAMINE 30 MG/ML IJ SOLN
30.0000 mg | Freq: Once | INTRAMUSCULAR | Status: AC
  Administered 2022-04-08: 30 mg via INTRAVENOUS
  Filled 2022-04-08: qty 1

## 2022-04-08 NOTE — Plan of Care (Signed)

## 2022-04-08 NOTE — Progress Notes (Signed)
Pt was complaining of throbbing headache and his BP was 180/105 manually.Informed MD and the order for discharge was kept on hold by MD

## 2022-04-08 NOTE — Progress Notes (Signed)
TRH night cross cover note:   I was notified by patient's RN that this patient is actively leaving AMA. I confirmed with RN that patient is alert and oriented. In briefly reviewing chart, it appears that there had been possibility of discharge to home today, but that discharge was ultimately put on holld in the setting of headache and elevated BP. The patient has now left AMA after signing AMA form.       Gary Pigg, DO Hospitalist

## 2022-04-08 NOTE — Discharge Summary (Signed)
Gary Mclaughlin ZOX:[id number] DOB: [birth year] DOA: 04/03/2022  PCP: Pcp, No  Admit date: 04/03/2022  Discharge date: 04/08/2022  Admitted From: Home   Disposition:  Home   Recommendations for Outpatient Follow-up:   Follow up with PCP in 1-2 weeks  PCP Please obtain BMP/CBC, 2 view CXR in 1week,  (see Discharge instructions)   PCP Please follow up on the following pending results:    Home Health: None   Equipment/Devices: None  Consultations: None  Discharge Condition: Stable    CODE STATUS: Full    Diet Recommendation: Heart Healthy     Chief Complaint  Patient presents with   Altered Mental Status     Brief history of present illness from the day of admission and additional interim summary      51 year old male who was found slumped over in the morning cereal and was transported via EMS to [facility]. Noted to be unable to protect his airway and was gurgling. In the ER he was diagnosed with alcohol intoxication with failure to protect airway, he was intubated and admitted by [facility] team.                                                                  Hospital Course   Acute alcohol intoxication causing failure to protect airway requiring brief endotracheal intubation complicated by aspiration pneumonia.  He was briefly intubated and admitted to ICU, now stabilized, started on Unasyn for aspiration pneumonia, clinically better, intoxication has resolved, CT head nonacute, mentation close to baseline.   Ongoing alcohol abuse.  Counseled to quit, on phenobarb taper, went into DTs date of 04/05/2022, DTs now resolved.  Wants to be discharged home.   History of seizures.  EEG stable, on Keppra continue.  1 month prescription provided.   Right lower extremity superficial wound present on admission.   Supportive care.  No signs of infection.   History of COPD.  At baseline.   Aspiration pneumonia.  Unasyn x 4 days, finished.   Hypokalemia.  Replaced.    Discharge diagnosis     Active Problems:   Altered mental status    Discharge instructions    Discharge Instructions     Diet - low sodium heart healthy   Complete by: As directed    Discharge instructions   Complete by: As directed    Do not drive, operate heavy machinery, perform activities at heights, swimming or participation in water activities or provide baby sitting services if your were admitted for syncope or siezures until you have seen by Primary MD or a Neurologist and advised to do so again.  Follow with Primary MD Pcp, No in 7 days   Get CBC, CMP, 2 view Chest X ray -  checked next visit with your primary MD  Activity: As tolerated with Full fall precautions use walker/cane & assistance as needed  Disposition Home    Diet: Heart Healthy   Special Instructions: If you have smoked or chewed Tobacco  in the last 2 yrs please stop smoking, stop any regular Alcohol  and or any Recreational drug use.  On your next visit with your primary care physician please Get Medicines reviewed and adjusted.  Please request your Prim.MD to go over all Hospital Tests and Procedure/Radiological results at the follow up, please get all Hospital records sent to your Prim MD by signing hospital release before you go home.  If you experience worsening of your admission symptoms, develop shortness of breath, life threatening emergency, suicidal or homicidal thoughts you must seek medical attention immediately by calling 911 or calling your MD immediately  if symptoms less severe.  You Must read complete instructions/literature along with all the possible adverse reactions/side effects for all the Medicines you take and that have been prescribed to you. Take any new Medicines after you have completely understood and accpet all the  possible adverse reactions/side effects.   Increase activity slowly   Complete by: As directed    No wound care   Complete by: As directed        Discharge Medications   Allergies as of 04/08/2022   No Known Allergies      Medication List     TAKE these medications    albuterol 108 (90 Base) MCG/ACT inhaler Commonly known as: VENTOLIN HFA Inhale 2 puffs into the lungs every 6 (six) hours as needed for wheezing or shortness of breath.   carvedilol 3.125 MG tablet Commonly known as: COREG Take 1 tablet (3.125 mg total) by mouth 2 (two) times daily with a meal.   folic acid 1 MG tablet Commonly known as: FOLVITE Take 1 tablet (1 mg total) by mouth daily.   levETIRAcetam 500 MG tablet Commonly known as: Keppra Take 1 tablet (500 mg total) by mouth 2 (two) times daily.   multivitamin with minerals Tabs tablet Take 1 tablet by mouth daily.   pantoprazole 40 MG tablet Commonly known as: PROTONIX Take 1 tablet (40 mg total) by mouth daily. Start taking on: April 09, 2022   thiamine 100 MG tablet Commonly known as: VITAMIN B1 Take 1 tablet (100 mg total) by mouth daily.         Follow-up Information     [facility] Follow up.   Contact information: [address] [place] [address] [phone]                Major procedures and Radiology Reports - PLEASE review detailed and final reports thoroughly  -        DG Chest Port 1 View  Result Date: 04/04/2022 CLINICAL DATA:  Pneumonia. EXAM: PORTABLE CHEST 1 VIEW COMPARISON:  Portable chest yesterday at 10:50 a.m. FINDINGS: 4:32 a.m. ETT/NGT interval removal. There are overlying monitor wires. The cardiac size is normal. No vascular congestion is seen. There is calcification in the aortic arch with normal mediastinal configuration. The lungs are emphysematous but clear. The sulci are sharp. Slight thoracic dextroscoliosis. IMPRESSION: No  evidence of acute chest disease. COPD. Aortic atherosclerosis. Interval extubation. Electronically Signed   By: Almira Bar M.D.   On: 04/04/2022 05:43   EEG adult  Result Date: 04/03/2022 Gary Quest, MD     04/03/2022  2:15 PM [name] MRN:  [id number] Epilepsy Attending: Charlsie QuestPriyanka O Mclaughlin Referring Physician/Provider: Lynnell CatalanAgarwala, Ravi, MD Date: 04/03/2022 Duration: 21.52 mins Patient history: 51yo m with ams. EEG to evaluate for seizure Level of alertness: lethargic AEDs during EEG study: None Technical aspects: This EEG study was done with scalp electrodes positioned according to the 10-20 International system of electrode placement. Electrical activity was reviewed with band pass filter of 1-70Hz , sensitivity of 7 uV/mm, display speed of 4530mm/sec with a 60Hz  notched filter applied as appropriate. EEG data were recorded continuously and digitally stored.  Video monitoring was available and reviewed as appropriate. Description: EEG showed continuous generalized 3 to 6 Hz theta-delta slowing. Hyperventilation and photic stimulation were not performed.   ABNORMALITY - Continuous slow, generalized IMPRESSION: This study is suggestive of moderate diffuse encephalopathy, nonspecific etiology. No seizures or epileptiform discharges were seen throughout the recording. Gary Annabelle Harman Mclaughlin   CT HEAD WO CONTRAST (5MM)  Result Date: 04/03/2022 CLINICAL DATA:  Mental status change, persistent or worsening EXAM: CT HEAD WITHOUT CONTRAST TECHNIQUE: Contiguous axial images were obtained from the base of the skull through the vertex without intravenous contrast. RADIATION DOSE REDUCTION: This exam was performed according to the departmental dose-optimization program which includes automated exposure control, adjustment of the mA and/or kV according to patient size and/or use of iterative reconstruction technique. COMPARISON:  10/26/2021 FINDINGS: Brain: No evidence of acute infarction, hemorrhage,  hydrocephalus, extra-axial collection or mass lesion/mass effect. Vascular: No hyperdense vessel or unexpected calcification. Skull: Normal. Negative for fracture or focal lesion. Sinuses/Orbits: Mucoperiosteal thickening identified consistent with chronic pansinusitis. IMPRESSION: No acute intracranial process. Electronically Signed   By: Layla MawJoshua  Pleasure M.D.   On: 04/03/2022 11:26   DG Chest Portable 1 View  Result Date: 04/03/2022 CLINICAL DATA:  Status post intubation. EXAM: PORTABLE CHEST 1 VIEW COMPARISON:  December 27, 2021. FINDINGS: The heart size and mediastinal contours are within normal limits. Endotracheal tube is in grossly good position. Nasogastric tube is seen in the proximal stomach. Both lungs are clear. The visualized skeletal structures are unremarkable. IMPRESSION: Endotracheal and nasogastric tubes are in grossly good position. No acute cardiopulmonary abnormality seen. Electronically Signed   By: Lupita RaiderJames  Green Jr M.D.   On: 04/03/2022 10:59    Micro Results    Recent Results (from the past 240 hour(s))  Urine Culture     Status: None   Collection Time: 04/03/22 10:33 AM   Specimen: Urine, Clean Catch  Result Value Ref Range Status   Specimen Description URINE, CLEAN CATCH  Final   Special Requests NONE  Final   Culture   Final    NO GROWTH Performed at [facility], [address] [place], [place] [address]    Report Status 04/04/2022 FINAL  Final  Blood culture (routine x 2)     Status: None   Collection Time: 04/03/22 10:34 AM   Specimen: BLOOD  Result Value Ref Range Status   Specimen Description BLOOD SITE NOT SPECIFIED  Final   Special Requests   Final    BOTTLES DRAWN AEROBIC AND ANAEROBIC Blood Culture adequate volume   Culture   Final    NO GROWTH 5 DAYS Performed at [facility], [address] [place], [place] [address]    Report Status 04/08/2022 FINAL  Final  Resp Panel by RT-PCR (Flu A&B, Covid) Peripheral     Status: None    Collection Time: 04/03/22 10:34 AM   Specimen: Peripheral; Nasal Swab  Result Value Ref Range Status  SARS Coronavirus 2 by RT PCR NEGATIVE NEGATIVE Final    Comment: (NOTE) SARS-CoV-2 target nucleic acids are NOT DETECTED.  The SARS-CoV-2 RNA is generally detectable in upper respiratory specimens during the acute phase of infection. The lowest concentration of SARS-CoV-2 viral copies this assay can detect is 138 copies/mL. A negative result does not preclude SARS-Cov-2 infection and should not be used as the sole basis for treatment or other patient management decisions. A negative result may occur with  improper specimen collection/handling, submission of specimen other than nasopharyngeal swab, presence of viral mutation(s) within the areas targeted by this assay, and inadequate number of viral copies(<138 copies/mL). A negative result must be combined with clinical observations, patient history, and epidemiological information. The expected result is Negative.  Fact Sheet for Patients:  BloggerCourse.com  Fact Sheet for Healthcare Providers:  SeriousBroker.it  This test is no t yet approved or cleared by the [place] FDA and  has been authorized for detection and/or diagnosis of SARS-CoV-2 by FDA under an Emergency Use Authorization (EUA). This EUA will remain  in effect (meaning this test can be used) for the duration of the COVID-19 declaration under Section 564(b)(1) of the Act, 21 U.S.C.section 360bbb-3(b)(1), unless the authorization is terminated  or revoked sooner.       Influenza A by PCR NEGATIVE NEGATIVE Final   Influenza B by PCR NEGATIVE NEGATIVE Final    Comment: (NOTE) The Xpert Xpress SARS-CoV-2/FLU/RSV plus assay is intended as an aid in the diagnosis of influenza from Nasopharyngeal swab specimens and should not be used as a sole basis for treatment. Nasal washings and aspirates are unacceptable for  Xpert Xpress SARS-CoV-2/FLU/RSV testing.  Fact Sheet for Patients: BloggerCourse.com  Fact Sheet for Healthcare Providers: SeriousBroker.it  This test is not yet approved or cleared by the [place] FDA and has been authorized for detection and/or diagnosis of SARS-CoV-2 by FDA under an Emergency Use Authorization (EUA). This EUA will remain in effect (meaning this test can be used) for the duration of the COVID-19 declaration under Section 564(b)(1) of the Act, 21 U.S.C. section 360bbb-3(b)(1), unless the authorization is terminated or revoked.  Performed at [facility], [address], [place], [place] [address]   Respiratory (~20 pathogens) panel by PCR     Status: None   Collection Time: 04/03/22 10:34 AM   Specimen: Nasopharyngeal Swab; Respiratory  Result Value Ref Range Status   Adenovirus NOT DETECTED NOT DETECTED Final   Coronavirus 229E NOT DETECTED NOT DETECTED Final    Comment: (NOTE) The Coronavirus on the Respiratory Panel, DOES NOT test for the novel  Coronavirus (2019 nCoV)    Coronavirus HKU1 NOT DETECTED NOT DETECTED Final   Coronavirus NL63 NOT DETECTED NOT DETECTED Final   Coronavirus OC43 NOT DETECTED NOT DETECTED Final   Metapneumovirus NOT DETECTED NOT DETECTED Final   Rhinovirus / Enterovirus NOT DETECTED NOT DETECTED Final   Influenza A NOT DETECTED NOT DETECTED Final   Influenza B NOT DETECTED NOT DETECTED Final   Parainfluenza Virus 1 NOT DETECTED NOT DETECTED Final   Parainfluenza Virus 2 NOT DETECTED NOT DETECTED Final   Parainfluenza Virus 3 NOT DETECTED NOT DETECTED Final   Parainfluenza Virus 4 NOT DETECTED NOT DETECTED Final   Respiratory Syncytial Virus NOT DETECTED NOT DETECTED Final   Bordetella pertussis NOT DETECTED NOT DETECTED Final   Bordetella Parapertussis NOT DETECTED NOT DETECTED Final   Chlamydophila pneumoniae NOT DETECTED NOT DETECTED Final   Mycoplasma pneumoniae  NOT  DETECTED NOT DETECTED Final    Comment: Performed at [facility], [address], [place], [place] [address]  MRSA Next Gen by PCR, Nasal     Status: None   Collection Time: 04/03/22  6:00 PM   Specimen: Nasal Mucosa; Nasal Swab  Result Value Ref Range Status   MRSA by PCR Next Gen NOT DETECTED NOT DETECTED Final    Comment: (NOTE) The GeneXpert MRSA Assay (FDA approved for NASAL specimens only), is one component of a comprehensive MRSA colonization surveillance program. It is not intended to diagnose MRSA infection nor to guide or monitor treatment for MRSA infections. Test performance is not FDA approved in patients less than 38 years old. Performed at [facility], [address], [place], [place] [address]   Blood culture (routine x 2)     Status: None   Collection Time: 04/03/22  6:20 PM   Specimen: BLOOD RIGHT ARM  Result Value Ref Range Status   Specimen Description BLOOD RIGHT ARM  Final   Special Requests   Final    BOTTLES DRAWN AEROBIC AND ANAEROBIC Blood Culture results may not be optimal due to an inadequate volume of blood received in culture bottles   Culture   Final    NO GROWTH 5 DAYS Performed at [facility], [address], [place], [place] [address]    Report Status 04/08/2022 FINAL  Final    Today   Subjective    Gary Mclaughlin today has no headache,no chest abdominal pain,no new weakness tingling or numbness, feels much better wants to go home today.    Objective   Blood pressure 124/77, pulse 88, temperature 97.7 F (36.5 C), temperature source Oral, resp. rate 16, height 5\' 9"  (1.753 m), weight 79.8 kg, SpO2 94 %.   Intake/Output Summary (Last 24 hours) at 04/08/2022 1137 Last data filed at 04/08/2022 0100 Gross per 24 hour  Intake --  Output 1450 ml  Net -1450 ml    Exam  Awake Alert, No new F.N deficits,    [place].AT,PERRAL Supple Neck,   Symmetrical Chest wall movement, Good air movement bilaterally, CTAB RRR,No  Gallops,   +ve B.Sounds, Abd Soft, Non tender,  No Cyanosis, Clubbing or edema    Data Review   Recent Labs  Lab 04/03/22 1033 04/03/22 1131 04/03/22 1819 04/04/22 0750 04/05/22 0155 04/06/22 0221 04/07/22 0827  WBC 10.0  --  6.7 13.6* 7.5 8.6 6.3  HGB 14.0   < > 13.3 11.6* 11.0* 12.3* 13.0  HCT 41.9   < > 38.8* 33.2* 33.3* 35.6* 36.9*  PLT 406*  --  347 281 242 248 263  MCV 100.0  --  99.0 97.9 99.1 96.2 96.3  MCH 33.4  --  33.9 34.2* 32.7 33.2 33.9  MCHC 33.4  --  34.3 34.9 33.0 34.6 35.2  RDW 12.4  --  12.6 12.7 12.5 11.9 12.1  LYMPHSABS 1.9  --   --   --  0.9 1.0 1.0  MONOABS 0.7  --   --   --  0.5 0.6 0.6  EOSABS 0.3  --   --   --  0.1 0.3 0.3  BASOSABS 0.2*  --   --   --  0.1 0.1 0.1   < > = values in this interval not displayed.    Recent Labs  Lab 04/03/22 1033 04/03/22 1131 04/03/22 1300 04/03/22 1819 04/04/22 0750 04/04/22 1002 04/05/22 0152 04/05/22 0155 04/06/22 0221 04/07/22 0827  NA 134* 135  --   --  136  --   --  134* 132* 133*  K 4.5 3.7  --   --  4.4  --   --  3.9 3.3* 3.7  CL 95*  --   --   --  105  --   --  100 93* 98  CO2 23  --   --   --  23  --   --  26 27 28   GLUCOSE 109*  --   --   --  97  --   --  119* 104* 125*  BUN <5*  --   --   --  6  --   --  7 <5* 10  CREATININE 0.66  --  0.52*  --  0.73  --   --  0.81 0.68 0.86  AST 106*  --   --   --   --   --   --  51* 57* 54*  ALT 63*  --   --   --   --   --   --  43 43 44  ALKPHOS 104  --   --   --   --   --   --  94 98 99  BILITOT 0.2*  --   --   --   --   --   --  0.7 0.7 0.5  ALBUMIN 3.3*  --   --   --   --   --   --  2.4* 3.0* 2.8*  PROCALCITON  --   --   --   --   --   --  <0.10  --  <0.10 <0.10  LATICACIDVEN 2.2*  --   --  2.1*  --   --   --   --   --   --   INR 1.0  --   --   --   --   --   --   --   --   --   TSH 1.420  --   --   --   --   --   --   --   --   --   AMMONIA 24  --   --   --   --   --   --   --   --   --   BNP  --   --   --   --   --   --   --   --  511.0* 179.5*   MG  --   --   --   --   --  1.6* 1.7  --  1.7 2.0  CALCIUM 9.3  --   --   --  8.6*  --   --  9.5 9.8 9.2     Total Time in preparing paper work, data evaluation and todays exam - 35 minutes  Signature  -    M.D on 04/08/2022 at 11:37 AM   -  To page go to [email or web address]

## 2022-04-08 NOTE — TOC Transition Note (Addendum)
Transition of Care [facility]) - CM/SW Discharge Note   Patient Details  Name: Gary Mclaughlin MRN: [id number] Date of Birth: Jan 17, 1971  [facility] CM/SW Contact:  Lawerance Sabal, RN Phone Number: 04/08/2022, 11:47 AM   Clinical Narrative:     Patient to DC today. Has been provided with social resources, and medications will be brought up from [facility] pharmacy for patient to take home. He has exhausted his MATCH for this year in June, however is homeless and does not have a way to be billed, so MATCH was reinstated. Appointment for Dec 12 at 9:15 at Internal Medicine added to AVS. Patient plans on using the bus and states that he has money for bus fare.   Final next level of care: Home/Self Care Barriers to Discharge: Continued Medical Work up   Patient Goals and CMS Choice Patient states their goals for this hospitalization and ongoing recovery are:: Feel better CMS Medicare.gov Compare Post Acute Care list provided to:: Patient Choice offered to / list presented to : Patient  Discharge Placement                       Discharge Plan and Services In-house Referral: Clinical Social Work                                   Social Determinants of Health (SDOH) Interventions     Readmission Risk Interventions     No data to display

## 2022-04-08 NOTE — Discharge Instructions (Addendum)
Do not drive, operate heavy machinery, perform activities at heights, swimming or participation in water activities or provide baby sitting services if your were admitted for syncope or siezures until you have seen by Primary MD or a Neurologist and advised to do so again.  Follow with Primary MD Pcp, No in 7 days   Get CBC, CMP, 2 view Chest X ray -  checked next visit with your primary MD    Activity: As tolerated with Full fall precautions use walker/cane & assistance as needed  Disposition Home    Diet: Heart Healthy   Special Instructions: If you have smoked or chewed Tobacco  in the last 2 yrs please stop smoking, stop any regular Alcohol  and or any Recreational drug use.  On your next visit with your primary care physician please Get Medicines reviewed and adjusted.  Please request your Prim.MD to go over all Hospital Tests and Procedure/Radiological results at the follow up, please get all Hospital records sent to your Prim MD by signing hospital release before you go home.  If you experience worsening of your admission symptoms, develop shortness of breath, life threatening emergency, suicidal or homicidal thoughts you must seek medical attention immediately by calling 911 or calling your MD immediately  if symptoms less severe.  You Must read complete instructions/literature along with all the possible adverse reactions/side effects for all the Medicines you take and that have been prescribed to you. Take any new Medicines after you have completely understood and accpet all the possible adverse reactions/side effects.

## 2022-04-09 ENCOUNTER — Other Ambulatory Visit (HOSPITAL_COMMUNITY): Payer: Self-pay

## 2022-04-11 ENCOUNTER — Other Ambulatory Visit (HOSPITAL_COMMUNITY): Payer: Self-pay

## 2022-04-13 ENCOUNTER — Emergency Department (HOSPITAL_COMMUNITY)
Admission: EM | Admit: 2022-04-13 | Discharge: 2022-04-15 | Disposition: A | Discharge status: Home / Self Care | Payer: Medicaid Other | Attending: Emergency Medicine | Admitting: Emergency Medicine

## 2022-04-13 ENCOUNTER — Other Ambulatory Visit: Payer: Self-pay

## 2022-04-13 ENCOUNTER — Encounter (HOSPITAL_COMMUNITY): Payer: Self-pay

## 2022-04-13 DIAGNOSIS — R45851 Suicidal ideations: Secondary | ICD-10-CM

## 2022-04-13 DIAGNOSIS — Z1152 Encounter for screening for COVID-19: Secondary | ICD-10-CM | POA: Diagnosis not present

## 2022-04-13 DIAGNOSIS — F10220 Alcohol dependence with intoxication, uncomplicated: Secondary | ICD-10-CM | POA: Diagnosis not present

## 2022-04-13 DIAGNOSIS — F1010 Alcohol abuse, uncomplicated: Secondary | ICD-10-CM | POA: Diagnosis not present

## 2022-04-13 DIAGNOSIS — F10229 Alcohol dependence with intoxication, unspecified: Secondary | ICD-10-CM | POA: Diagnosis present

## 2022-04-13 LAB — RAPID URINE DRUG SCREEN, HOSP PERFORMED
Amphetamines: NOT DETECTED
Barbiturates: POSITIVE — AB
Benzodiazepines: NOT DETECTED
Cocaine: NOT DETECTED
Opiates: NOT DETECTED
Tetrahydrocannabinol: NOT DETECTED

## 2022-04-13 LAB — COMPREHENSIVE METABOLIC PANEL
ALT: 95 U/L — ABNORMAL HIGH (ref 0–44)
AST: 116 U/L — ABNORMAL HIGH (ref 15–41)
Albumin: 3.2 g/dL — ABNORMAL LOW (ref 3.5–5.0)
Alkaline Phosphatase: 89 U/L (ref 38–126)
Anion gap: 9 (ref 5–15)
BUN: 8 mg/dL (ref 6–20)
CO2: 22 mmol/L (ref 22–32)
Calcium: 9.3 mg/dL (ref 8.9–10.3)
Chloride: 102 mmol/L (ref 98–111)
Creatinine, Ser: 0.86 mg/dL (ref 0.61–1.24)
GFR, Estimated: >60 mL/min (ref >60)
Glucose, Bld: 91 mg/dL (ref 70–99)
Potassium: 6.4 mmol/L (ref 3.5–5.1)
Sodium: 133 mmol/L — ABNORMAL LOW (ref 135–145)
Total Bilirubin: 1 mg/dL (ref 0.3–1.2)
Total Protein: 7.5 g/dL (ref 6.5–8.1)

## 2022-04-13 LAB — RESP PANEL BY RT-PCR (RSV, FLU A&B, COVID)  RVPGX2
Influenza A by PCR: NEGATIVE
Influenza B by PCR: NEGATIVE
Resp Syncytial Virus by PCR: NEGATIVE
SARS Coronavirus 2 by RT PCR: NEGATIVE

## 2022-04-13 LAB — CBC
HCT: 36.2 % — ABNORMAL LOW (ref 39.0–52.0)
Hemoglobin: 12 g/dL — ABNORMAL LOW (ref 13.0–17.0)
MCH: 33.7 pg (ref 26.0–34.0)
MCHC: 33.1 g/dL (ref 30.0–36.0)
MCV: 101.7 fL — ABNORMAL HIGH (ref 80.0–100.0)
Platelets: 412 10*3/uL — ABNORMAL HIGH (ref 150–400)
RBC: 3.56 MIL/uL — ABNORMAL LOW (ref 4.22–5.81)
RDW: 12.3 % (ref 11.5–15.5)
WBC: 5.3 10*3/uL (ref 4.0–10.5)
nRBC: 0 % (ref 0.0–0.2)

## 2022-04-13 LAB — BASIC METABOLIC PANEL
Anion gap: 9 (ref 5–15)
BUN: 8 mg/dL (ref 6–20)
CO2: 24 mmol/L (ref 22–32)
Calcium: 9.1 mg/dL (ref 8.9–10.3)
Chloride: 102 mmol/L (ref 98–111)
Creatinine, Ser: 0.81 mg/dL (ref 0.61–1.24)
GFR, Estimated: >60 mL/min (ref >60)
Glucose, Bld: 90 mg/dL (ref 70–99)
Potassium: 4.2 mmol/L (ref 3.5–5.1)
Sodium: 135 mmol/L (ref 135–145)

## 2022-04-13 LAB — ETHANOL: Alcohol, Ethyl (B): 301 mg/dL (ref <10)

## 2022-04-13 MED ORDER — LORAZEPAM 2 MG/ML IJ SOLN: 0.0000 mg | Freq: Four times a day (QID) | INTRAMUSCULAR | Status: DC

## 2022-04-13 MED ORDER — LORAZEPAM 1 MG PO TABS: 0.0000 mg | ORAL_TABLET | Freq: Four times a day (QID) | ORAL | Status: DC

## 2022-04-13 MED ORDER — THIAMINE HCL 100 MG/ML IJ SOLN: 100.0000 mg | Freq: Every day | INTRAMUSCULAR | Status: DC

## 2022-04-13 MED ORDER — THIAMINE MONONITRATE 100 MG PO TABS
100.0000 mg | ORAL_TABLET | Freq: Every day | ORAL | Status: DC
  Administered 2022-04-14 – 2022-04-15 (×2): 100 mg via ORAL
  Filled 2022-04-13 (×2): qty 1

## 2022-04-13 MED ORDER — LORAZEPAM 1 MG PO TABS: 0.0000 mg | ORAL_TABLET | Freq: Two times a day (BID) | ORAL | Status: DC

## 2022-04-13 MED ORDER — LORAZEPAM 2 MG/ML IJ SOLN: 0.0000 mg | Freq: Two times a day (BID) | INTRAMUSCULAR | Status: DC

## 2022-04-13 NOTE — ED Provider Notes (Signed)
[facility]-EMERGENCY DEPT Provider Note   CSN: [id number] Arrival date & time: 04/13/22  1607     History  Chief Complaint  Patient presents with   Alcohol Intoxication   Suicidal    Gary Mclaughlin is a 51 y.o. male.   Alcohol Intoxication  Patient presents with alcohol intoxication/withdrawal.  Brought in by [employer].  Reportedly has been drinking but also states he is withdrawing.  States he has not drank enough today.  Feels that he is shaky.  Also is suicidal.  States he does not want to live but does not have an active plan.  States that he does not remember the last time he was in the hospital.  5 days ago he was discharged after being in the hospital and admitted for intoxication that required intubation complicated by aspiration pneumonia and DTs.  Had DTs despite being on phenobarb taper.  Patient states he does not remember this.  Denies other drug use.    Past Medical History:  Diagnosis Date   Depression    ETOH abuse    Hepatic steatosis 10/30/2021   Seizure (HCC)    Subarachnoid hemorrhage (HCC) 07/28/2017   Tobacco abuse 10/27/2021    Home Medications Prior to Admission medications   Medication Sig Start Date End Date Taking? Authorizing Provider  albuterol (VENTOLIN HFA) 108 (90 Base) MCG/ACT inhaler Inhale 2 puffs into the lungs every 6 (six) hours as needed for wheezing or shortness of breath. Patient not taking: Reported on 04/13/2022 04/08/22   Leroy Sea, MD  carvedilol (COREG) 3.125 MG tablet Take 1 tablet (3.125 mg total) by mouth 2 (two) times daily with a meal. Patient not taking: Reported on 04/13/2022 04/08/22   Leroy Sea, MD  folic acid (FOLVITE) 1 MG tablet Take 1 tablet (1 mg total) by mouth daily. Patient not taking: Reported on 04/13/2022 04/08/22   Leroy Sea, MD  levETIRAcetam (KEPPRA) 500 MG tablet Take 1 tablet (500 mg total) by mouth 2 (two) times daily. Patient not taking: Reported on 04/13/2022  04/08/22 05/08/22  Leroy Sea, MD  Multiple Vitamin (MULTIVITAMIN WITH MINERALS) TABS tablet Take 1 tablet by mouth daily. Patient not taking: Reported on 04/13/2022 04/08/22   Leroy Sea, MD  pantoprazole (PROTONIX) 40 MG tablet Take 1 tablet (40 mg total) by mouth daily. Patient not taking: Reported on 04/13/2022 04/09/22   Leroy Sea, MD  thiamine (VITAMIN B1) 100 MG tablet Take 1 tablet (100 mg total) by mouth daily. Patient not taking: Reported on 04/13/2022 04/08/22   Leroy Sea, MD      Allergies    Patient has no known allergies.    Review of Systems   Review of Systems  Physical Exam Updated Vital Signs BP 124/84 (BP Location: Left Arm)   Pulse 88   Temp 98.7 F (37.1 C) (Oral)   Resp 16   Ht 5\' 9"  (1.753 m)   Wt 79 kg   SpO2 90%   BMI 25.72 kg/m  Physical Exam Vitals reviewed.  Eyes:     Pupils: Pupils are equal, round, and reactive to light.  Cardiovascular:     Rate and Rhythm: Normal rate.  Pulmonary:     Effort: No respiratory distress.  Abdominal:     Tenderness: There is no abdominal tenderness.  Musculoskeletal:     Cervical back: Neck supple.  Neurological:     Mental Status: He is alert.     Comments: Awake and  appropriate.  Mild tremor of hands.     ED Results / Procedures / Treatments   Labs (all labs ordered are listed, but only abnormal results are displayed) Labs Reviewed  COMPREHENSIVE METABOLIC PANEL - Abnormal; Notable for the following components:      Result Value   Sodium 133 (*)    Potassium 6.4 (*)    Albumin 3.2 (*)    AST 116 (*)    ALT 95 (*)    All other components within normal limits  ETHANOL - Abnormal; Notable for the following components:   Alcohol, Ethyl (B) 301 (*)    All other components within normal limits  CBC - Abnormal; Notable for the following components:   RBC 3.56 (*)    Hemoglobin 12.0 (*)    HCT 36.2 (*)    MCV 101.7 (*)    Platelets 412 (*)    All other components within  normal limits  RAPID URINE DRUG SCREEN, HOSP PERFORMED - Abnormal; Notable for the following components:   Barbiturates POSITIVE (*)    All other components within normal limits  RESP PANEL BY RT-PCR (RSV, FLU A&B, COVID)  [id number]  BASIC METABOLIC PANEL    EKG None  Radiology No results found.  Procedures Procedures    Medications Ordered in ED Medications  LORazepam (ATIVAN) injection 0-4 mg ( Intravenous See Alternative 04/13/22 2255)    Or  LORazepam (ATIVAN) tablet 0-4 mg (0 mg Oral Hold 04/13/22 2255)  LORazepam (ATIVAN) injection 0-4 mg (has no administration in time range)    Or  LORazepam (ATIVAN) tablet 0-4 mg (has no administration in time range)  thiamine (VITAMIN B1) tablet 100 mg (has no administration in time range)    Or  thiamine (VITAMIN B1) injection 100 mg (has no administration in time range)    ED Course/ Medical Decision Making/ A&P                           Medical Decision Making Amount and/or Complexity of Data Reviewed Labs: ordered.  Risk OTC drugs. Prescription drug management.   Patient presents with alcohol abuse/withdrawal.  States he has not had enough to drink today.  Reviewing notes recently had intubation and DTs while in the hospital.  Patient states he does not remember this.  Differential diagnosis does include alcohol intoxication/withdrawal.  Also states he is suicidal.  Denies overdose.  Denies other drug use.  Will get basic blood work and continue to monitor.  Vitals have been stable.  Sleeping comfortably.  Has been started on CIWA protocol.  For now patient is seems to be medically cleared.  I think he can be seen by TTS and will continue to monitor.  In therapy if worsening despite being on CIWA protocol could require admission to the hospital but at this point appears stable.        Final Clinical Impression(s) / ED Diagnoses Final diagnoses:  Alcohol abuse  Suicidal ideations    Rx / DC Orders ED Discharge  Orders     None         Benjiman Core, MD 04/13/22 2320

## 2022-04-13 NOTE — ED Notes (Signed)
Pt urine sent to lab.

## 2022-04-13 NOTE — ED Triage Notes (Signed)
Pt brought in by [employer] due to alcohol intoxication. Pt states he is SI without plan and homeless. Pt states he drank 3 -"40s" of beer today.

## 2022-04-13 NOTE — ED Notes (Signed)
Pt has 4 personal belongings bags located in cabinet 19/22 [place]

## 2022-04-13 NOTE — ED Notes (Signed)
Pt wanded by security. 

## 2022-04-14 DIAGNOSIS — R45851 Suicidal ideations: Secondary | ICD-10-CM

## 2022-04-14 DIAGNOSIS — F10220 Alcohol dependence with intoxication, uncomplicated: Secondary | ICD-10-CM | POA: Diagnosis not present

## 2022-04-14 MED ORDER — LEVETIRACETAM 500 MG PO TABS
500.0000 mg | ORAL_TABLET | Freq: Two times a day (BID) | ORAL | Status: DC
  Administered 2022-04-14 – 2022-04-15 (×3): 500 mg via ORAL
  Filled 2022-04-14 (×3): qty 1

## 2022-04-14 NOTE — Consult Note (Addendum)
BH ED ASSESSMENT   Reason for Consult:  Psychiatry evaluation Referring Physician:  ER Physician Patient Identification: Gary Mclaughlin MRN:  [id number] ED Chief Complaint: Alcohol dependence with intoxication (HCC)  Diagnosis:  Principal Problem:   Alcohol dependence with intoxication (HCC) Active Problems:   Suicide ideation   ED Assessment Time Calculation: Start Time: 1131 Stop Time: 1156 Total Time in Minutes (Assessment Completion): 25   Subjective:   Gary Mclaughlin is a 51 y.o. male patient admitted with hx significant for Alcohol Dependence, Alcohol withdrawal seizures and Depression was brought to the ER by [employer] for Alcohol intoxication.  Patient has had alcoholism since age 32 he says.  He drinks 3 -40 oz Beer daily or more.    HPI:  Patient was seen in the conference room for evaluation.  Patient reports feeling hopeless, helpless and worthless due to his inability to control his alcohol intake.  Patient reports that he has lost everything because of drinking.  Patient was sober for a period of 9 years but relapsed due to the Financial crisis of few years ago.  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  Patient resumed using Alcohol since then.  He reports hx of Alcohol withdrawal seizure as recent as May this year.  Patient reports depression low self esteem and feels ashamed of his weakness for Alcohol.  He felt suicidal yesterday and still feels suicidal but has no plans.  Patient is homeless currently.  He [facility] after detox treatment.  Patient reports last Psychiatric was back in 2015 and he has not been taking any Psychotropic medications.  He admits using Alcohol to treat his depression and low self esteem. Patient is a caucasian male 51 years who was brought to the ER for Alcohol intoxication.  He is alert and oriented x5, he is disheveled and unkempt.  He has visible mild tremors.  He feels depressed and rated depression 8/10 with 10  being severe depression.  He feels worthless and hopeless because he lost everything he has and cannot stop drinking Alcohol.  Patient meets criteria for inpatient detox treatment with a plan to go seek long term alcohol rehab treatment care.  He is already on CIWA protocol with Ativan coverage.  We will monitor closely for Alcohol withdrawal seizure since he had one in May of this year.  We will seek bed placement at any facility with available bed.   Past Psychiatric History: hx significant for Alcohol Dependence, Alcohol withdrawal seizures and Depression.  He last sort Psychiatric care in 2015.  Since then he has not been on Psych medications or seen any mental health provider..  Suicide attempt in 2010 in [place] when he jumped off a bridge under the influence of Alcohol, Ambien and a Benzodiazepine.  Risk to Self or Others: Is the patient at risk to self? Yes Has the patient been a risk to self in the past 6 months? Yes Has the patient been a risk to self within the distant past? Yes Is the patient a risk to others? No Has the patient been a risk to others in the past 6 months? No Has the patient been a risk to others within the distant past? No  [place] Scale:  Flowsheet Row ED from 04/13/2022 in [facility] ED to Hosp-Admission (Discharged) from 04/03/2022 in [facility] ED from 01/09/2022 in [facility]  C-SSRS RISK CATEGORY High Risk No Risk No  Risk       AIMS:  , , ,  ,   ASAM:    Substance Abuse:     Past Medical History:  Past Medical History:  Diagnosis Date   Depression    ETOH abuse    Hepatic steatosis 10/30/2021   Seizure (HCC)    Subarachnoid hemorrhage (HCC) 07/28/2017   Tobacco abuse 10/27/2021   History reviewed. No pertinent surgical history. Family History:  Family History  Problem Relation Age of Onset   Hypertension Other    Family Psychiatric  History:  denies Social History:  Social History   Substance and Sexual Activity  Alcohol Use Yes     Social History   Substance and Sexual Activity  Drug Use No   Types: Marijuana   Comment: hx of mj use    Social History   Socioeconomic History   Marital status: Single    Spouse name: Not on file   Number of children: Not on file   Years of education: Not on file   Highest education level: Not on file  Occupational History   Not on file  Tobacco Use   Smoking status: Former    Packs/day: 1.00    Years: 25.00    Total pack years: 25.00    Types: Cigarettes   Smokeless tobacco: Never   Tobacco comments:    Declined  Vaping Use   Vaping Use: Never used  Substance and Sexual Activity   Alcohol use: Yes   Drug use: No    Types: Marijuana    Comment: hx of mj use   Sexual activity: Not on file  Other Topics Concern   Not on file  Social History Narrative   ** Merged History Encounter **       Social Determinants of Health   Financial Resource Strain: Not on file  Food Insecurity: Food Insecurity Present (04/03/2022)   Hunger Vital Sign    Worried About Running Out of Food in the Last Year: Often true    Ran Out of Food in the Last Year: Often true  Transportation Needs: Unmet Transportation Needs (04/03/2022)   PRAPARE - Administrator, Civil Service (Medical): Yes    Lack of Transportation (Non-Medical): Yes  Physical Activity: Not on file  Stress: Not on file  Social Connections: Not on file   Additional Social History:    Allergies:  No Known Allergies  Labs:  Results for orders placed or performed during the hospital encounter of 04/13/22 (from the past 48 hour(s))  Urine rapid drug screen (hosp performed)     Status: Abnormal   Collection Time: 04/13/22  5:10 PM  Result Value Ref Range   Opiates NONE DETECTED NONE DETECTED   Cocaine NONE DETECTED NONE DETECTED   Benzodiazepines NONE DETECTED NONE DETECTED   Amphetamines NONE DETECTED NONE  DETECTED   Tetrahydrocannabinol NONE DETECTED NONE DETECTED   Barbiturates POSITIVE (A) NONE DETECTED    Comment: (NOTE) DRUG SCREEN FOR MEDICAL PURPOSES ONLY.  IF CONFIRMATION IS NEEDED FOR ANY PURPOSE, NOTIFY LAB WITHIN 5 DAYS.  LOWEST DETECTABLE LIMITS FOR URINE DRUG SCREEN Drug Class                     Cutoff (ng/mL) Amphetamine and metabolites    1000 Barbiturate and metabolites    200 Benzodiazepine                 200 Opiates and metabolites  300 Cocaine and metabolites        300 THC                            50 Performed at [facility], [address] [place], [place] [address]   Comprehensive metabolic panel     Status: Abnormal   Collection Time: 04/13/22  5:32 PM  Result Value Ref Range   Sodium 133 (L) 135 - 145 mmol/L   Potassium 6.4 (HH) 3.5 - 5.1 mmol/L    Comment: HEMOLYSIS AT THIS LEVEL MAY AFFECT RESULT CRITICAL RESULT CALLED TO, READ BACK BY AND VERIFIED WITH Hermelinda MedicusBONKANO, S., RN @ [phone] ON 04/13/22 BY LJM    Chloride 102 98 - 111 mmol/L   CO2 22 22 - 32 mmol/L   Glucose, Bld 91 70 - 99 mg/dL    Comment: Glucose reference range applies only to samples taken after fasting for at least 8 hours.   BUN 8 6 - 20 mg/dL   Creatinine, Ser 4.090.86 0.61 - 1.24 mg/dL   Calcium 9.3 8.9 - 81.110.3 mg/dL   Total Protein 7.5 6.5 - 8.1 g/dL   Albumin 3.2 (L) 3.5 - 5.0 g/dL   AST 914116 (H) 15 - 41 U/L    Comment: HEMOLYSIS AT THIS LEVEL MAY AFFECT RESULT   ALT 95 (H) 0 - 44 U/L    Comment: HEMOLYSIS AT THIS LEVEL MAY AFFECT RESULT   Alkaline Phosphatase 89 38 - 126 U/L   Total Bilirubin 1.0 0.3 - 1.2 mg/dL    Comment: HEMOLYSIS AT THIS LEVEL MAY AFFECT RESULT   GFR, Estimated >60 >60 mL/min    Comment: (NOTE) Calculated using the CKD-EPI Creatinine Equation (2021)    Anion gap 9 5 - 15    Comment: Performed at [facility], [address] [place], [place] [address]  Ethanol     Status: Abnormal   Collection Time: 04/13/22  5:32  PM  Result Value Ref Range   Alcohol, Ethyl (B) 301 (HH) <10 mg/dL    Comment: CRITICAL RESULT CALLED TO, READ BACK BY AND VERIFIED WITH BULLEN, M. RN @ 1808 ON 04/13/22 BY LJM (NOTE) Lowest detectable limit for serum alcohol is 10 mg/dL.  For medical purposes only. Performed at [facility], [address] [place], [place] [address]   CBC     Status: Abnormal   Collection Time: 04/13/22  5:32 PM  Result Value Ref Range   WBC 5.3 4.0 - 10.5 K/uL   RBC 3.56 (L) 4.22 - 5.81 MIL/uL   Hemoglobin 12.0 (L) 13.0 - 17.0 g/dL   HCT 86.536.2 (L) 78.439.0 - 69.652.0 %   MCV 101.7 (H) 80.0 - 100.0 fL   MCH 33.7 26.0 - 34.0 pg   MCHC 33.1 30.0 - 36.0 g/dL   RDW 29.512.3 28.411.5 - 13.215.5 %   Platelets 412 (H) 150 - 400 K/uL   nRBC 0.0 0.0 - 0.2 %    Comment: Performed at [facility], [address] [place], [place] [address]  Resp panel by RT-PCR (RSV, Flu A&B, Covid) Anterior Nasal Swab     Status: None   Collection Time: 04/13/22  5:41 PM   Specimen: Anterior Nasal Swab  Result Value Ref Range   SARS Coronavirus 2 by RT PCR NEGATIVE NEGATIVE    Comment: (NOTE) SARS-CoV-2 target nucleic acids are NOT DETECTED.  The SARS-CoV-2 RNA is generally detectable  in upper respiratory specimens during the acute phase of infection. The lowest concentration of SARS-CoV-2 viral copies this assay can detect is 138 copies/mL. A negative result does not preclude SARS-Cov-2 infection and should not be used as the sole basis for treatment or other patient management decisions. A negative result may occur with  improper specimen collection/handling, submission of specimen other than nasopharyngeal swab, presence of viral mutation(s) within the areas targeted by this assay, and inadequate number of viral copies(<138 copies/mL). A negative result must be combined with clinical observations, patient history, and epidemiological information. The expected result is Negative.  Fact Sheet  for Patients:  BloggerCourse.com  Fact Sheet for Healthcare Providers:  SeriousBroker.it  This test is no t yet approved or cleared by the [place] FDA and  has been authorized for detection and/or diagnosis of SARS-CoV-2 by FDA under an Emergency Use Authorization (EUA). This EUA will remain  in effect (meaning this test can be used) for the duration of the COVID-19 declaration under Section 564(b)(1) of the Act, 21 U.S.C.section 360bbb-3(b)(1), unless the authorization is terminated  or revoked sooner.       Influenza A by PCR NEGATIVE NEGATIVE   Influenza B by PCR NEGATIVE NEGATIVE    Comment: (NOTE) The Xpert Xpress SARS-CoV-2/FLU/RSV plus assay is intended as an aid in the diagnosis of influenza from Nasopharyngeal swab specimens and should not be used as a sole basis for treatment. Nasal washings and aspirates are unacceptable for Xpert Xpress SARS-CoV-2/FLU/RSV testing.  Fact Sheet for Patients: BloggerCourse.com  Fact Sheet for Healthcare Providers: SeriousBroker.it  This test is not yet approved or cleared by the [place] FDA and has been authorized for detection and/or diagnosis of SARS-CoV-2 by FDA under an Emergency Use Authorization (EUA). This EUA will remain in effect (meaning this test can be used) for the duration of the COVID-19 declaration under Section 564(b)(1) of the Act, 21 U.S.C. section 360bbb-3(b)(1), unless the authorization is terminated or revoked.     Resp Syncytial Virus by PCR NEGATIVE NEGATIVE    Comment: (NOTE) Fact Sheet for Patients: BloggerCourse.com  Fact Sheet for Healthcare Providers: SeriousBroker.it  This test is not yet approved or cleared by the [place] FDA and has been authorized for detection and/or diagnosis of SARS-CoV-2 by FDA under an Emergency Use  Authorization (EUA). This EUA will remain in effect (meaning this test can be used) for the duration of the COVID-19 declaration under Section 564(b)(1) of the Act, 21 U.S.C. section 360bbb-3(b)(1), unless the authorization is terminated or revoked.  Performed at [facility], [address], [place], [place] [address]   Basic metabolic panel     Status: None   Collection Time: 04/13/22  6:20 PM  Result Value Ref Range   Sodium 135 135 - 145 mmol/L   Potassium 4.2 3.5 - 5.1 mmol/L   Chloride 102 98 - 111 mmol/L   CO2 24 22 - 32 mmol/L   Glucose, Bld 90 70 - 99 mg/dL    Comment: Glucose reference range applies only to samples taken after fasting for at least 8 hours.   BUN 8 6 - 20 mg/dL   Creatinine, Ser 9.44 0.61 - 1.24 mg/dL   Calcium 9.1 8.9 - 96.7 mg/dL   GFR, Estimated >59 >16 mL/min    Comment: (NOTE) Calculated using the CKD-EPI Creatinine Equation (2021)    Anion gap 9 5 - 15    Comment: Performed at [facility], [address]  [place], [place] [address]    Current Facility-Administered Medications  Medication Dose Route Frequency Provider Last Rate Last Admin   levETIRAcetam (KEPPRA) tablet 500 mg  500 mg Oral BID Lorre Nick, MD   500 mg at 04/14/22 1140   LORazepam (ATIVAN) injection 0-4 mg  0-4 mg Intravenous Q6H Benjiman Core, MD       Or   LORazepam (ATIVAN) tablet 0-4 mg  0-4 mg Oral Q6H Benjiman Core, MD       Melene Muller ON 04/16/2022] LORazepam (ATIVAN) injection 0-4 mg  0-4 mg Intravenous Orma Render, MD       Or   Melene Muller ON 04/16/2022] LORazepam (ATIVAN) tablet 0-4 mg  0-4 mg Oral Q12H Benjiman Core, MD       thiamine (VITAMIN B1) tablet 100 mg  100 mg Oral Daily Benjiman Core, MD   100 mg at 04/14/22 6283   Or   thiamine (VITAMIN B1) injection 100 mg  100 mg Intravenous Daily Benjiman Core, MD       Current Outpatient Medications  Medication Sig Dispense Refill   albuterol (VENTOLIN  HFA) 108 (90 Base) MCG/ACT inhaler Inhale 2 puffs into the lungs every 6 (six) hours as needed for wheezing or shortness of breath. (Patient not taking: Reported on 04/13/2022) 6.7 g 0   carvedilol (COREG) 3.125 MG tablet Take 1 tablet (3.125 mg total) by mouth 2 (two) times daily with a meal. (Patient not taking: Reported on 04/13/2022) 60 tablet 0   folic acid (FOLVITE) 1 MG tablet Take 1 tablet (1 mg total) by mouth daily. (Patient not taking: Reported on 04/13/2022) 30 tablet 0   levETIRAcetam (KEPPRA) 500 MG tablet Take 1 tablet (500 mg total) by mouth 2 (two) times daily. (Patient not taking: Reported on 04/13/2022) 60 tablet 0   Multiple Vitamin (MULTIVITAMIN WITH MINERALS) TABS tablet Take 1 tablet by mouth daily. (Patient not taking: Reported on 04/13/2022) 30 tablet 0   pantoprazole (PROTONIX) 40 MG tablet Take 1 tablet (40 mg total) by mouth daily. (Patient not taking: Reported on 04/13/2022) 30 tablet 0   thiamine (VITAMIN B1) 100 MG tablet Take 1 tablet (100 mg total) by mouth daily. (Patient not taking: Reported on 04/13/2022) 30 tablet 0    Musculoskeletal: Strength & Muscle Tone: within normal limits Gait & Station: normal Patient leans: Front   Psychiatric Specialty Exam: Presentation  General Appearance:  Casual; Disheveled  Eye Contact: Good  Speech: Clear and Coherent; Normal Rate  Speech Volume: Normal  Handedness: Right   Mood and Affect  Mood: Depressed; Anxious; Worthless; Hopeless  Affect: Congruent; Depressed   Thought Process  Thought Processes: Coherent; Goal Directed; Linear  Descriptions of Associations:Intact  Orientation:Full (Time, Place and Person)  Thought Content:Logical  History of Schizophrenia/Schizoaffective disorder:No  Duration of Psychotic Symptoms:No data recorded Hallucinations:Hallucinations: None  Ideas of Reference:None  Suicidal Thoughts:Suicidal Thoughts: Yes, Active SI Active Intent and/or Plan: -- (Patient  does not have plan at this time)  Homicidal Thoughts:Homicidal Thoughts: No   Sensorium  Memory: Immediate Good; Recent Good; Remote Good  Judgment: Good  Insight: Present; Good   Executive Functions  Concentration: Good  Attention Span: Good  Recall: Good  Fund of Knowledge: Good  Language: Good   Psychomotor Activity  Psychomotor Activity: Psychomotor Activity: Tremor   Assets  Assets: Communication Skills; Desire for Improvement    Sleep  Sleep: Sleep: Fair   Physical Exam: Physical Exam Vitals and nursing note reviewed.  Constitutional:  Appearance: Normal appearance.  HENT:     Head: Normocephalic and atraumatic.     Nose: Nose normal.  Cardiovascular:     Rate and Rhythm: Normal rate and regular rhythm.  Pulmonary:     Effort: Pulmonary effort is normal.  Musculoskeletal:        General: Normal range of motion.     Cervical back: Normal range of motion.  Skin:    General: Skin is warm and dry.  Neurological:     Mental Status: He is alert and oriented to person, place, and time.    Review of Systems  Constitutional: Negative.   HENT: Negative.    Eyes: Negative.   Respiratory: Negative.    Cardiovascular: Negative.   Gastrointestinal: Negative.   Genitourinary: Negative.   Musculoskeletal: Negative.   Skin: Negative.   Neurological:  Positive for seizures.       Alcohol withdrawal seizures  Endo/Heme/Allergies: Negative.   Psychiatric/Behavioral:  Positive for depression and suicidal ideas. The patient is nervous/anxious.    Blood pressure (!) 169/107, pulse 99, temperature 98.4 F (36.9 C), temperature source Oral, resp. rate 18, height  (1.753 m), weight 79 kg, SpO2 98 %. Body mass index is 25.72 kg/m.  Medical Decision Making: Patient remains suicidal without a plan, has a hx of previous suicide attempt long time ago.  We will seek inpatient Psychiatry hospitalization for detox treatment and close monitoring  as he has hx of Alcohol withdrawal seizures.  He is already started on CIWA protocol with Ativan coverage. And patient is on Keppra to prevent withdrawal seizures.  Problem 1: Alcohol Dependence with intoxication.  Problem 2: Recurrent Major Depressive disorder, severe without Psychotic features.  Problem 3: Suicide ideation.  Disposition:  Admit, seek bed placement.  Earney Navy, NP-PMHNP-BC 04/14/2022 12:00 PM

## 2022-04-15 ENCOUNTER — Ambulatory Visit: Payer: Self-pay | Admitting: Internal Medicine

## 2022-04-15 DIAGNOSIS — F1021 Alcohol dependence, in remission: Secondary | ICD-10-CM | POA: Diagnosis not present

## 2022-04-15 DIAGNOSIS — F10220 Alcohol dependence with intoxication, uncomplicated: Secondary | ICD-10-CM

## 2022-04-15 NOTE — ED Notes (Signed)
Pt belongings returned to pt 

## 2022-04-15 NOTE — ED Provider Notes (Addendum)
Emergency Medicine Observation Re-evaluation Note  Gary Mclaughlin is a 51 y.o. male, seen on rounds today.  Pt initially presented to the ED for complaints of Alcohol Intoxication and Suicidal Currently, the patient is resting, awaiting placement.  Physical Exam  BP (!) 153/109 (BP Location: Left Arm)   Pulse 69   Temp 98.9 F (37.2 C) (Oral)   Resp 16   Ht 5\' 9"  (1.753 m)   Wt 79 kg   SpO2 97%   BMI 25.72 kg/m  Physical Exam General: Calm Cardiac: Well perfused Lungs: Even respirations Psych: Calm  ED Course / MDM  EKG:   I have reviewed the labs performed to date as well as medications administered while in observation.  Recent changes in the last 24 hours include medical clearance; looking for placement.  Plan  Current plan is for placement.  12:51 PM  TTS re-evaluated this AM with plan for discharge and list of local resources. Patient updated and agreeable with this plan. Will provide list of area resources. Denies SI/HI/AH/VH. No EtOH withdrawal symptoms at this time. CIWA has down-trended to 0.    , MD 04/15/22 14/12/23    6606, MD 04/15/22 1251    14/12/23, MD 04/15/22 1253

## 2022-04-15 NOTE — Discharge Summary (Signed)
Gary Mclaughlin Psych ED Discharge  04/15/2022 11:54 AM Gary Mclaughlin  MRN:  [id number]  Principal Problem: Alcohol dependence with intoxication [facility]) Discharge Diagnoses: Principal Problem:   Alcohol dependence with intoxication ([place]) Active Problems:   Suicide ideation  Clinical Impression:  Final diagnoses:  Alcohol abuse  Suicidal ideations   Subjective: Gary Mclaughlin is a 51 y.o. male patient admitted with hx significant for Alcohol Dependence, Alcohol withdrawal seizures and Depression was brought to the ER by [employer] for Alcohol intoxication.  Patient has had alcoholism since age 89 he says.  He drinks 3 -40 oz Beer daily or more.    This morning patient was seen alert, oriented x5 and he denies SI/HI/AVH today.  Patient reports that he is no longer experiencing withdrawal symptoms.  He plans to move into [facility] for long term detox treatment.  He denies having a gun at home and he  is homeless and has been for a while.  He plans to move to a shelter pending his admission to [facility].  We discussed safety plans-call 911 or 988 for mental health crisis, suicide ideation or any mental health emergency.  Go to any ER or [place] for mental health care.  Patient verbalized understanding.  Patient is Psychiatrically cleared.and discharge plan reviewed with DR Leverne Humbles, Psychiatrist.  ED Assessment Time Calculation: Start Time: 1103 Stop Time: 1120 Total Time in Minutes (Assessment Completion): 17   Past Psychiatric History: see initial Psychiatric evaluation note  Past Medical History:  Past Medical History:  Diagnosis Date   Depression    ETOH abuse    Hepatic steatosis 10/30/2021   Seizure ([place])    Subarachnoid hemorrhage ([place]) 07/28/2017   Tobacco abuse 10/27/2021   History reviewed. No pertinent surgical history. Family History:  Family History  Problem Relation Age of Onset   Hypertension Other    Family Psychiatric  History: see initial Psychiatric evaluation  note Social History:  Social History   Substance and Sexual Activity  Alcohol Use Yes     Social History   Substance and Sexual Activity  Drug Use No   Types: Marijuana   Comment: hx of mj use    Social History   Socioeconomic History   Marital status: Single    Spouse name: Not on file   Number of children: Not on file   Years of education: Not on file   Highest education level: Not on file  Occupational History   Not on file  Tobacco Use   Smoking status: Former    Packs/day: 1.00    Years: 25.00    Total pack years: 25.00    Types: Cigarettes   Smokeless tobacco: Never   Tobacco comments:    Declined  Vaping Use   Vaping Use: Never used  Substance and Sexual Activity   Alcohol use: Yes   Drug use: No    Types: Marijuana    Comment: hx of mj use   Sexual activity: Not on file  Other Topics Concern   Not on file  Social History Narrative   ** Merged History Encounter **       Social Determinants of Health   Financial Resource Strain: Not on file  Food Insecurity: Food Insecurity Present (04/03/2022)   Hunger Vital Sign    Worried About Running Out of Food in the Last Year: Often true    Ran Out of Food in the Last Year: Often true  Transportation Needs: Unmet Transportation Needs (04/03/2022)  PRAPARE - Administrator, Civil Service (Medical): Yes    Lack of Transportation (Non-Medical): Yes  Physical Activity: Not on file  Stress: Not on file  Social Connections: Not on file    Tobacco Cessation:  N/A, patient does not currently use tobacco products  Current Medications: Current Facility-Administered Medications  Medication Dose Route Frequency Provider Last Rate Last Admin   levETIRAcetam (KEPPRA) tablet 500 mg  500 mg Oral BID Lorre Nick, MD   500 mg at 04/15/22 0904   LORazepam (ATIVAN) injection 0-4 mg  0-4 mg Intravenous Q6H Benjiman Core, MD       Or   LORazepam (ATIVAN) tablet 0-4 mg  0-4 mg Oral Q6H Benjiman Core, MD       [START ON 04/16/2022] LORazepam (ATIVAN) injection 0-4 mg  0-4 mg Intravenous Orma Render, MD       Or   Melene Muller ON 04/16/2022] LORazepam (ATIVAN) tablet 0-4 mg  0-4 mg Oral Q12H Benjiman Core, MD       thiamine (VITAMIN B1) tablet 100 mg  100 mg Oral Daily Benjiman Core, MD   100 mg at 04/15/22 4540   Or   thiamine (VITAMIN B1) injection 100 mg  100 mg Intravenous Daily Benjiman Core, MD       Current Outpatient Medications  Medication Sig Dispense Refill   albuterol (VENTOLIN HFA) 108 (90 Base) MCG/ACT inhaler Inhale 2 puffs into the lungs every 6 (six) hours as needed for wheezing or shortness of breath. (Patient not taking: Reported on 04/13/2022) 6.7 g 0   carvedilol (COREG) 3.125 MG tablet Take 1 tablet (3.125 mg total) by mouth 2 (two) times daily with a meal. (Patient not taking: Reported on 04/13/2022) 60 tablet 0   folic acid (FOLVITE) 1 MG tablet Take 1 tablet (1 mg total) by mouth daily. (Patient not taking: Reported on 04/13/2022) 30 tablet 0   levETIRAcetam (KEPPRA) 500 MG tablet Take 1 tablet (500 mg total) by mouth 2 (two) times daily. (Patient not taking: Reported on 04/13/2022) 60 tablet 0   Multiple Vitamin (MULTIVITAMIN WITH MINERALS) TABS tablet Take 1 tablet by mouth daily. (Patient not taking: Reported on 04/13/2022) 30 tablet 0   pantoprazole (PROTONIX) 40 MG tablet Take 1 tablet (40 mg total) by mouth daily. (Patient not taking: Reported on 04/13/2022) 30 tablet 0   thiamine (VITAMIN B1) 100 MG tablet Take 1 tablet (100 mg total) by mouth daily. (Patient not taking: Reported on 04/13/2022) 30 tablet 0   PTA Medications: (Not in a hospital admission)   [place] Scale:  Flowsheet Row ED from 04/13/2022 in [facility] to Hosp-Admission (Discharged) from 04/03/2022 in [facility] ED from [facility]/09/2022 in [facility]  C-SSRS RISK CATEGORY High  Risk No Risk No Risk       Musculoskeletal: Strength & Muscle Tone: within normal limits Gait & Station: normal Patient leans: Front  Psychiatric Specialty Exam: Presentation  General Appearance:  Casual; Fairly Groomed  Eye Contact: Good  Speech: Clear and Coherent; Normal Rate  Speech Volume: Normal  Handedness: Right   Mood and Affect  Mood: Depressed  Affect: Congruent   Thought Process  Thought Processes: Coherent; Goal Directed; Linear  Descriptions of Associations:Intact  Orientation:Full (Time, Place and Person)  Thought Content:Logical  History of Schizophrenia/Schizoaffective disorder:No  Duration of Psychotic Symptoms:No data recorded Hallucinations:Hallucinations: None  Ideas of Reference:None  Suicidal Thoughts:Suicidal Thoughts: No SI Active Intent  and/or Plan: -- (Patient does not have plan at this time)  Homicidal Thoughts:Homicidal Thoughts: No   Sensorium  Memory: Immediate Good; Recent Good; Remote Good  Judgment: Intact  Insight: Good; Present   Executive Functions  Concentration: Good  Attention Span: Good  Recall: Good  Fund of Knowledge: Good  Language: Good   Psychomotor Activity  Psychomotor Activity: Psychomotor Activity: Normal   Assets  Assets: Communication Skills; Desire for Improvement; Physical Health   Sleep  Sleep: Sleep: Good    Physical Exam: Physical Exam Vitals and nursing note reviewed.  Constitutional:      Appearance: Normal appearance.  HENT:     Head: Normocephalic and atraumatic.     Nose: Nose normal.  Cardiovascular:     Rate and Rhythm: Normal rate and regular rhythm.  Pulmonary:     Effort: Pulmonary effort is normal.  Musculoskeletal:        General: Normal range of motion.     Cervical back: Normal range of motion.  Skin:    General: Skin is warm and dry.  Neurological:     General: No focal deficit present.     Mental Status: He is alert and  oriented to person, place, and time.    Review of Systems  Constitutional: Negative.   HENT: Negative.    Eyes: Negative.   Respiratory: Negative.    Cardiovascular: Negative.   Gastrointestinal: Negative.   Genitourinary: Negative.   Musculoskeletal: Negative.   Skin: Negative.   Neurological: Negative.   Endo/Heme/Allergies: Negative.   Psychiatric/Behavioral:  Positive for depression.    Blood pressure (!) 153/109, pulse 69, temperature 98.9 F (37.2 C), temperature source Oral, resp. rate 16, height 5\' 9"  (1.753 m), weight 79 kg, SpO2 97 %. Body mass index is 25.72 kg/m.   Demographic Factors:  Male, Adolescent or young adult, Caucasian, Low socioeconomic status, and Living alone  Loss Factors: Financial problems/change in socioeconomic status  Historical Factors: Prior suicide attempts and Family history of mental illness or substance abuse  Risk Reduction Factors:   Religious beliefs about death and Positive coping skills or problem solving skills  Continued Clinical Symptoms:  Depression:   Comorbid alcohol abuse/dependence Alcohol/Substance Abuse/Dependencies  Cognitive Features That Contribute To Risk:  None    Suicide Risk:  Minimal: No identifiable suicidal ideation.  Patients presenting with no risk factors but with morbid ruminations; may be classified as minimal risk based on the severity of the depressive symptoms    Plan Of Care/Follow-up recommendations:  Activity:  as tolerated Diet:  Regular  Medical Decision Making: Patient denies alcohol withdrawal symptoms this morning.  He scored 0 for CIWA assessment.  Patient plans to move into [facility] for long term alcohol treatment.  Patient denies SI/HI/AVH and no mention of paranoia.  Patient is Psychiatrically cleared. Disposition: Psychiatrically cleared for discharge.  Delfin Gant, NP 04/15/2022, 11:54 AM

## 2022-04-15 NOTE — Progress Notes (Addendum)
Addendum:  Per [facility] pt has not been medically cleared.   CSW requested [facility] Fransico Michael, RN to review pt for [facility]. CSW will assist and follow with placement.   Maryjean Ka, MSW, LCSWA 04/15/2022 1:32 AM

## 2022-04-15 NOTE — Discharge Instructions (Signed)
Substance Abuse Treatment Programs ° °Intensive Outpatient Programs °[facility]     °[address]      °[place], [place]                   °[phone]      ° °[facility] °[address] °[place], [place] °336-379-7146 ° °[facility]     °(Inpatient and outpatient)     °[address]           °[phone]   ° °[facility] °[phone] (Suboxone and Methadone) ° °[address]      °[place], [place] [address]      °[phone]      ° °[address] °[place], [place] °852-3033 ° °[facility] (Outpatient/Inpatient, [facility])    °(insurance only) [phone]      °       °[facility] (Groups & Residential) °[place], [place] °[phone] ° °   °[facility]     °[address]     °[place], [place]      °[phone]      ° °[facility] (for caregivers and family) °[address] °[place], [place] °336-299-4655 ° ° ° ° ° °Residential Treatment Programs °[facility]      °[address], [place], [place] [address]  °[phone]      ° °[facility] °[address] [place], [place] [address] °[phone] ° °[facility]        °336-248-8914      ° °[facility] °[phone] ° °[facility]             °[address]                                         °[place], [place]                                                °877-615-2722 or 336-784-9470                              ° °[facility] [place] °[address] °[place], [address] °[phone] ° °[facility]    °[address]      °[place], [place]     °[phone]      ° °[facility] °[address] °[place], [place] °336-285-9073 ° °[facility]   °[address]     °[place], [place] [address]     °[phone]      °Admissions: 8am-3pm M-F ° °[facility] °[address] °[place],  [place] °336-227-7417 ° °[facility]: Residential Program (90 Days)   °[place], [place]      °336-725-8389 or 800-758-6077    ° °[facility] °[place], [place] °(Walk in Hours over the weekend or by referral) ° °[facility] °[address], [place], [place] [address] °([phone] ° °Crisis Mobile: [facility]:  1-877-626-1772 (for crisis response 24 hours a day) °[facility] Hotline:      1-800-256-2452 °Outpatient Psychiatry and Counseling ° °[facility]: Mobile Crisis   Management 24 hours:  1-877-626-1772 ° °[facility] sliding scale fee and walk in schedule: M-F 8am-12pm/1pm-3pm °[address]  °[place], [place] [address] °[phone] ° °[facility] °[address] °[place], [place] [address] °336-703-9650 ° °[facility] (Former[facility] known [facility])- new patient walk-in appointments available Monday - Friday 8am -3pm.          °[address] °[place], [place] [address] °336-676-6840 or crisis line- 336-676-6905 ° °[facility] Intensive Outpatient Therapy Program °[address] °[place], [place] [address] °336-832-9804 ° °[facility]                  °Crisis Services      °[phone]      °[address]     °[place], [place] [address]                ° °[facility]   °[facility] °[phone] °[address] °[place], [place] [address] ° ° °[facility]          °[address]  °[place], [place] [address]       °[phone] ° °[facility] °[address] °[place], [place] [address] °336-292-1510 ° °[facility]    °[address]    °[place], [place] [address]     °336-632-3505      ° °Parish McKinney, MD     °[address]     °[place] [address]     °336-282-1251     °  °[facility] °[address] °[place] [address] ° °[facility]     °[address]     °[place], [place]      °336-542-2076      ° °[facility] °Shamsher Ahluwalia, MD °[address] °[place], [place] [address] °[phone] ° °[facility]     °[address]     °[place], [place] [address]     °336-274-1237      ° °[facility] °[address] °[place], [place] [address] °336-854-4450 °Resources for Temporary Residential Assistance/Crisis Centers ° °DAY CENTERS °[facility] °M-F 8am-3pm   °[address] [place], [place] [address]   336-332-0824 °Services include: laundry, barbering, support groups, case management, phone  & computer access, showers, AA/NA mtgs, mental health/substance abuse nurse, job skills class, disability information, VA assistance, spiritual classes, etc.  ° °HOMELESS SHELTERS ° °[facility]     °[facility]   °[address], [place]     °336.271.5959       °       °[facility] (women and children)       °[address] °[place], [place] [address] °336-275-0820 °[email or web address] for application and process °Application Required ° °[facility]   °[address]    °[place] [address]     °[phone]       °             °[facility] °[address] °, [place] [address] °336.273.5572 °336-235-0363(schedule application appt.) °Application Required ° °[facility] (women only)    °[address]     °[place], [place] [address]     °336-884-1039      °  Intake starts 6pm daily °Need valid ID, SSC, & Police report °[facility] [place] °[address] °[place], [place] °336-881-5420 °Application Required ° °[facility] (men only)     °[address]      °[place], [place]     °[phone]      ° °[facility] °(Pregnant women only) °[address] °[place], [place] °336-275-0206 ° °[facility]      °[address]      °[place], [place] [address]     °[phone]      °       °[facility] °[address] °[place], [place] °336-723-1848 °90 day commitment/SA/Application process ° °[facility](men only)     °[address]     °[place], [place]     °336-748-1962       °Check-in at 7pm     °       °[facility] °[address] °[place], [place] [address] °336-248-6684 °Men/Women/Women and Children must be there by 7 pm ° °[facility] °[place], [place] °336-722-8721                ° °

## 2022-04-16 ENCOUNTER — Emergency Department (HOSPITAL_COMMUNITY): Payer: Medicaid Other

## 2022-04-16 ENCOUNTER — Inpatient Hospital Stay (HOSPITAL_COMMUNITY)
Admission: EM | Admit: 2022-04-16 | Discharge: 2022-04-21 | DRG: 958 | Disposition: A | Discharge status: Home / Self Care | Payer: Medicaid Other | Attending: Surgery | Admitting: Surgery

## 2022-04-16 DIAGNOSIS — R7989 Other specified abnormal findings of blood chemistry: Secondary | ICD-10-CM | POA: Diagnosis present

## 2022-04-16 DIAGNOSIS — T1490XA Injury, unspecified, initial encounter: Secondary | ICD-10-CM

## 2022-04-16 DIAGNOSIS — Z79899 Other long term (current) drug therapy: Secondary | ICD-10-CM

## 2022-04-16 DIAGNOSIS — Y9301 Activity, walking, marching and hiking: Secondary | ICD-10-CM | POA: Diagnosis present

## 2022-04-16 DIAGNOSIS — S80212A Abrasion, left knee, initial encounter: Secondary | ICD-10-CM | POA: Diagnosis present

## 2022-04-16 DIAGNOSIS — S80211A Abrasion, right knee, initial encounter: Secondary | ICD-10-CM | POA: Diagnosis present

## 2022-04-16 DIAGNOSIS — S0181XA Laceration without foreign body of other part of head, initial encounter: Secondary | ICD-10-CM | POA: Diagnosis present

## 2022-04-16 DIAGNOSIS — G40909 Epilepsy, unspecified, not intractable, without status epilepticus: Secondary | ICD-10-CM | POA: Diagnosis present

## 2022-04-16 DIAGNOSIS — Y92410 Unspecified street and highway as the place of occurrence of the external cause: Secondary | ICD-10-CM

## 2022-04-16 DIAGNOSIS — F17210 Nicotine dependence, cigarettes, uncomplicated: Secondary | ICD-10-CM | POA: Diagnosis present

## 2022-04-16 DIAGNOSIS — Y908 Blood alcohol level of 240 mg/100 ml or more: Secondary | ICD-10-CM | POA: Diagnosis present

## 2022-04-16 DIAGNOSIS — S32049A Unspecified fracture of fourth lumbar vertebra, initial encounter for closed fracture: Secondary | ICD-10-CM | POA: Diagnosis present

## 2022-04-16 DIAGNOSIS — Z8701 Personal history of pneumonia (recurrent): Secondary | ICD-10-CM | POA: Diagnosis not present

## 2022-04-16 DIAGNOSIS — D62 Acute posthemorrhagic anemia: Secondary | ICD-10-CM | POA: Diagnosis present

## 2022-04-16 DIAGNOSIS — J449 Chronic obstructive pulmonary disease, unspecified: Secondary | ICD-10-CM | POA: Diagnosis present

## 2022-04-16 DIAGNOSIS — S60811A Abrasion of right wrist, initial encounter: Secondary | ICD-10-CM | POA: Diagnosis present

## 2022-04-16 DIAGNOSIS — S3983XA Other specified injuries of pelvis, initial encounter: Secondary | ICD-10-CM | POA: Diagnosis not present

## 2022-04-16 DIAGNOSIS — Z8249 Family history of ischemic heart disease and other diseases of the circulatory system: Secondary | ICD-10-CM

## 2022-04-16 DIAGNOSIS — S60812A Abrasion of left wrist, initial encounter: Secondary | ICD-10-CM | POA: Diagnosis present

## 2022-04-16 DIAGNOSIS — R45851 Suicidal ideations: Secondary | ICD-10-CM | POA: Diagnosis present

## 2022-04-16 DIAGNOSIS — S069X9A Unspecified intracranial injury with loss of consciousness of unspecified duration, initial encounter: Secondary | ICD-10-CM | POA: Diagnosis present

## 2022-04-16 DIAGNOSIS — F32A Depression, unspecified: Secondary | ICD-10-CM | POA: Diagnosis present

## 2022-04-16 DIAGNOSIS — S51012A Laceration without foreign body of left elbow, initial encounter: Secondary | ICD-10-CM | POA: Diagnosis present

## 2022-04-16 DIAGNOSIS — F10129 Alcohol abuse with intoxication, unspecified: Secondary | ICD-10-CM | POA: Diagnosis present

## 2022-04-16 DIAGNOSIS — K760 Fatty (change of) liver, not elsewhere classified: Secondary | ICD-10-CM | POA: Diagnosis present

## 2022-04-16 DIAGNOSIS — Z9151 Personal history of suicidal behavior: Secondary | ICD-10-CM

## 2022-04-16 DIAGNOSIS — S32811A Multiple fractures of pelvis with unstable disruption of pelvic ring, initial encounter for closed fracture: Principal | ICD-10-CM | POA: Diagnosis present

## 2022-04-16 DIAGNOSIS — Z87828 Personal history of other (healed) physical injury and trauma: Secondary | ICD-10-CM

## 2022-04-16 DIAGNOSIS — K219 Gastro-esophageal reflux disease without esophagitis: Secondary | ICD-10-CM | POA: Diagnosis present

## 2022-04-16 DIAGNOSIS — S32810A Multiple fractures of pelvis with stable disruption of pelvic ring, initial encounter for closed fracture: Principal | ICD-10-CM

## 2022-04-16 DIAGNOSIS — S32059A Unspecified fracture of fifth lumbar vertebra, initial encounter for closed fracture: Secondary | ICD-10-CM | POA: Diagnosis present

## 2022-04-16 DIAGNOSIS — F10220 Alcohol dependence with intoxication, uncomplicated: Secondary | ICD-10-CM | POA: Diagnosis not present

## 2022-04-16 DIAGNOSIS — Z5900 Homelessness unspecified: Secondary | ICD-10-CM

## 2022-04-16 LAB — COMPREHENSIVE METABOLIC PANEL
ALT: 69 U/L — ABNORMAL HIGH (ref 0–44)
AST: 78 U/L — ABNORMAL HIGH (ref 15–41)
Albumin: 3.1 g/dL — ABNORMAL LOW (ref 3.5–5.0)
Alkaline Phosphatase: 74 U/L (ref 38–126)
Anion gap: 12 (ref 5–15)
BUN: 9 mg/dL (ref 6–20)
CO2: 24 mmol/L (ref 22–32)
Calcium: 9.6 mg/dL (ref 8.9–10.3)
Chloride: 103 mmol/L (ref 98–111)
Creatinine, Ser: 0.71 mg/dL (ref 0.61–1.24)
GFR, Estimated: >60 mL/min (ref >60)
Glucose, Bld: 103 mg/dL — ABNORMAL HIGH (ref 70–99)
Potassium: 4 mmol/L (ref 3.5–5.1)
Sodium: 139 mmol/L (ref 135–145)
Total Bilirubin: <0.1 mg/dL — ABNORMAL LOW (ref 0.3–1.2)
Total Protein: 6.6 g/dL (ref 6.5–8.1)

## 2022-04-16 LAB — CBC
HCT: 33.5 % — ABNORMAL LOW (ref 39.0–52.0)
Hemoglobin: 11.5 g/dL — ABNORMAL LOW (ref 13.0–17.0)
MCH: 34.1 pg — ABNORMAL HIGH (ref 26.0–34.0)
MCHC: 34.3 g/dL (ref 30.0–36.0)
MCV: 99.4 fL (ref 80.0–100.0)
Platelets: 507 10*3/uL — ABNORMAL HIGH (ref 150–400)
RBC: 3.37 MIL/uL — ABNORMAL LOW (ref 4.22–5.81)
RDW: 12.8 % (ref 11.5–15.5)
WBC: 8 10*3/uL (ref 4.0–10.5)
nRBC: 0 % (ref 0.0–0.2)

## 2022-04-16 LAB — SAMPLE TO BLOOD BANK

## 2022-04-16 LAB — PROTIME-INR
INR: 0.9 (ref 0.8–1.2)
Prothrombin Time: 12.5 seconds (ref 11.4–15.2)

## 2022-04-16 LAB — ETHANOL: Alcohol, Ethyl (B): 331 mg/dL (ref <10)

## 2022-04-16 LAB — LACTIC ACID, PLASMA: Lactic Acid, Venous: 1.5 mmol/L (ref 0.5–1.9)

## 2022-04-16 LAB — I-STAT CHEM 8, ED
BUN: 10 mg/dL (ref 6–20)
Calcium, Ion: 1.15 mmol/L (ref 1.15–1.40)
Chloride: 102 mmol/L (ref 98–111)
Creatinine, Ser: 1.1 mg/dL (ref 0.61–1.24)
Glucose, Bld: 101 mg/dL — ABNORMAL HIGH (ref 70–99)
HCT: 35 % — ABNORMAL LOW (ref 39.0–52.0)
Hemoglobin: 11.9 g/dL — ABNORMAL LOW (ref 13.0–17.0)
Potassium: 4 mmol/L (ref 3.5–5.1)
Sodium: 137 mmol/L (ref 135–145)
TCO2: 25 mmol/L (ref 22–32)

## 2022-04-16 LAB — MAGNESIUM: Magnesium: 1.8 mg/dL (ref 1.7–2.4)

## 2022-04-16 MED ORDER — LACTATED RINGERS IV SOLN
INTRAVENOUS | Status: DC
  Administered 2022-04-18: 125 mL/h via INTRAVENOUS

## 2022-04-16 MED ORDER — ADULT MULTIVITAMIN W/MINERALS CH
1.0000 | ORAL_TABLET | Freq: Every day | ORAL | Status: DC
  Filled 2022-04-16: qty 1

## 2022-04-16 MED ORDER — THIAMINE HCL 100 MG PO TABS: 100.0000 mg | ORAL_TABLET | Freq: Every day | ORAL | Status: DC

## 2022-04-16 MED ORDER — HYDROMORPHONE HCL 1 MG/ML IJ SOLN
0.5000 mg | INTRAMUSCULAR | Status: DC | PRN
  Administered 2022-04-17 (×4): 0.5 mg via INTRAVENOUS
  Filled 2022-04-16 (×4): qty 1

## 2022-04-16 MED ORDER — ADULT MULTIVITAMIN W/MINERALS CH
1.0000 | ORAL_TABLET | Freq: Every day | ORAL | Status: DC
  Administered 2022-04-16 – 2022-04-21 (×6): 1 via ORAL
  Filled 2022-04-16 (×4): qty 1

## 2022-04-16 MED ORDER — FOLIC ACID 1 MG PO TABS
1.0000 mg | ORAL_TABLET | Freq: Every day | ORAL | Status: DC
  Administered 2022-04-16: 1 mg via ORAL
  Filled 2022-04-16: qty 1

## 2022-04-16 MED ORDER — ALBUTEROL SULFATE HFA 108 (90 BASE) MCG/ACT IN AERS: 2.0000 | INHALATION_SPRAY | Freq: Four times a day (QID) | RESPIRATORY_TRACT | Status: DC | PRN

## 2022-04-16 MED ORDER — LEVETIRACETAM 500 MG PO TABS
500.0000 mg | ORAL_TABLET | Freq: Two times a day (BID) | ORAL | Status: DC
  Administered 2022-04-16 – 2022-04-21 (×9): 500 mg via ORAL
  Filled 2022-04-16 (×10): qty 1

## 2022-04-16 MED ORDER — CARVEDILOL 3.125 MG PO TABS
3.1250 mg | ORAL_TABLET | Freq: Two times a day (BID) | ORAL | Status: DC
  Administered 2022-04-17 – 2022-04-21 (×9): 3.125 mg via ORAL
  Filled 2022-04-16 (×9): qty 1

## 2022-04-16 MED ORDER — TETANUS-DIPHTH-ACELL PERTUSSIS 5-2.5-18.5 LF-MCG/0.5 IM SUSY
0.5000 mL | PREFILLED_SYRINGE | Freq: Once | INTRAMUSCULAR | Status: DC
  Filled 2022-04-16: qty 0.5

## 2022-04-16 MED ORDER — ACETAMINOPHEN 325 MG PO TABS
650.0000 mg | ORAL_TABLET | Freq: Four times a day (QID) | ORAL | Status: DC
  Administered 2022-04-16 – 2022-04-17 (×2): 650 mg via ORAL
  Filled 2022-04-16 (×2): qty 2

## 2022-04-16 MED ORDER — FOLIC ACID 1 MG PO TABS
1.0000 mg | ORAL_TABLET | Freq: Every day | ORAL | Status: DC
  Administered 2022-04-18 – 2022-04-21 (×4): 1 mg via ORAL
  Filled 2022-04-16 (×4): qty 1

## 2022-04-16 MED ORDER — OXYCODONE HCL 5 MG PO TABS
10.0000 mg | ORAL_TABLET | ORAL | Status: DC | PRN
  Administered 2022-04-16: 10 mg via ORAL
  Filled 2022-04-16: qty 2

## 2022-04-16 MED ORDER — PANTOPRAZOLE SODIUM 40 MG PO TBEC
40.0000 mg | DELAYED_RELEASE_TABLET | Freq: Every day | ORAL | Status: DC
  Administered 2022-04-16 – 2022-04-21 (×5): 40 mg via ORAL
  Filled 2022-04-16 (×5): qty 1

## 2022-04-16 MED ORDER — THIAMINE MONONITRATE 100 MG PO TABS
100.0000 mg | ORAL_TABLET | Freq: Every day | ORAL | Status: DC
  Administered 2022-04-16 – 2022-04-21 (×5): 100 mg via ORAL
  Filled 2022-04-16 (×5): qty 1

## 2022-04-16 MED ORDER — CHLORDIAZEPOXIDE HCL 5 MG PO CAPS
10.0000 mg | ORAL_CAPSULE | Freq: Three times a day (TID) | ORAL | Status: DC
  Administered 2022-04-16 – 2022-04-19 (×8): 10 mg via ORAL
  Filled 2022-04-16 (×7): qty 2
  Filled 2022-04-16: qty 1

## 2022-04-16 MED ORDER — LORAZEPAM 1 MG PO TABS: 1.0000 mg | ORAL_TABLET | ORAL | Status: AC | PRN

## 2022-04-16 MED ORDER — ALBUTEROL SULFATE (2.5 MG/3ML) 0.083% IN NEBU
2.5000 mg | INHALATION_SOLUTION | Freq: Four times a day (QID) | RESPIRATORY_TRACT | Status: DC | PRN
  Administered 2022-04-17: 2.5 mg via RESPIRATORY_TRACT
  Filled 2022-04-16: qty 3

## 2022-04-16 MED ORDER — THIAMINE HCL 100 MG/ML IJ SOLN: 100.0000 mg | Freq: Every day | INTRAMUSCULAR | Status: DC

## 2022-04-16 MED ORDER — IOHEXOL 350 MG/ML SOLN
75.0000 mL | Freq: Once | INTRAVENOUS | Status: AC | PRN
  Administered 2022-04-16: 75 mL via INTRAVENOUS

## 2022-04-16 MED ORDER — IBUPROFEN 200 MG PO TABS: 600.0000 mg | ORAL_TABLET | Freq: Four times a day (QID) | ORAL | Status: DC | PRN

## 2022-04-16 MED ORDER — LORAZEPAM 2 MG/ML IJ SOLN: 1.0000 mg | INTRAMUSCULAR | Status: AC | PRN

## 2022-04-16 MED ORDER — FENTANYL CITRATE PF 50 MCG/ML IJ SOSY: 25.0000 ug | PREFILLED_SYRINGE | INTRAMUSCULAR | Status: DC | PRN

## 2022-04-16 MED ORDER — ONDANSETRON HCL 4 MG/2ML IJ SOLN
4.0000 mg | Freq: Four times a day (QID) | INTRAMUSCULAR | Status: DC | PRN
  Administered 2022-04-17: 4 mg via INTRAVENOUS

## 2022-04-16 MED ORDER — MELATONIN 3 MG PO TABS: 3.0000 mg | ORAL_TABLET | Freq: Every evening | ORAL | Status: DC | PRN

## 2022-04-16 MED ORDER — ONDANSETRON 4 MG PO TBDP: 4.0000 mg | ORAL_TABLET | Freq: Four times a day (QID) | ORAL | Status: DC | PRN

## 2022-04-16 MED ORDER — HYDRALAZINE HCL 20 MG/ML IJ SOLN: 10.0000 mg | INTRAMUSCULAR | Status: DC | PRN
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
## 2022-04-16 NOTE — ED Notes (Signed)
Pt transported to CT ?
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
## 2022-04-17 ENCOUNTER — Other Ambulatory Visit: Payer: Self-pay

## 2022-04-17 ENCOUNTER — Inpatient Hospital Stay (HOSPITAL_COMMUNITY): Payer: Medicaid Other

## 2022-04-17 ENCOUNTER — Encounter (HOSPITAL_COMMUNITY): Payer: Self-pay

## 2022-04-17 ENCOUNTER — Inpatient Hospital Stay (HOSPITAL_COMMUNITY): Payer: Medicaid Other | Admitting: Anesthesiology

## 2022-04-17 ENCOUNTER — Encounter (HOSPITAL_COMMUNITY): Admission: EM | Disposition: A | Discharge status: Home / Self Care | Payer: Self-pay | Source: Home / Self Care

## 2022-04-17 DIAGNOSIS — S3983XA Other specified injuries of pelvis, initial encounter: Secondary | ICD-10-CM

## 2022-04-17 HISTORY — PX: ORIF PELVIC FRACTURE WITH PERCUTANEOUS SCREWS: SHX6800

## 2022-04-17 LAB — CBC
HCT: 30.5 % — ABNORMAL LOW (ref 39.0–52.0)
HCT: 31.9 % — ABNORMAL LOW (ref 39.0–52.0)
Hemoglobin: 10.5 g/dL — ABNORMAL LOW (ref 13.0–17.0)
Hemoglobin: 10.5 g/dL — ABNORMAL LOW (ref 13.0–17.0)
MCH: 34 pg (ref 26.0–34.0)
MCH: 33.5 pg (ref 26.0–34.0)
MCHC: 34.4 g/dL (ref 30.0–36.0)
MCHC: 32.9 g/dL (ref 30.0–36.0)
MCV: 98.7 fL (ref 80.0–100.0)
MCV: 101.9 fL — ABNORMAL HIGH (ref 80.0–100.0)
Platelets: 479 10*3/uL — ABNORMAL HIGH (ref 150–400)
Platelets: 448 10*3/uL — ABNORMAL HIGH (ref 150–400)
RBC: 3.09 MIL/uL — ABNORMAL LOW (ref 4.22–5.81)
RBC: 3.13 MIL/uL — ABNORMAL LOW (ref 4.22–5.81)
RDW: 12.8 % (ref 11.5–15.5)
RDW: 12.9 % (ref 11.5–15.5)
WBC: 14.6 10*3/uL — ABNORMAL HIGH (ref 4.0–10.5)
WBC: 13.9 10*3/uL — ABNORMAL HIGH (ref 4.0–10.5)
nRBC: 0 % (ref 0.0–0.2)
nRBC: 0 % (ref 0.0–0.2)

## 2022-04-17 LAB — COMPREHENSIVE METABOLIC PANEL
ALT: 66 U/L — ABNORMAL HIGH (ref 0–44)
AST: 83 U/L — ABNORMAL HIGH (ref 15–41)
Albumin: 2.9 g/dL — ABNORMAL LOW (ref 3.5–5.0)
Alkaline Phosphatase: 72 U/L (ref 38–126)
Anion gap: 9 (ref 5–15)
BUN: 9 mg/dL (ref 6–20)
CO2: 23 mmol/L (ref 22–32)
Calcium: 9.4 mg/dL (ref 8.9–10.3)
Chloride: 106 mmol/L (ref 98–111)
Creatinine, Ser: 0.64 mg/dL (ref 0.61–1.24)
GFR, Estimated: >60 mL/min (ref >60)
Glucose, Bld: 107 mg/dL — ABNORMAL HIGH (ref 70–99)
Potassium: 4 mmol/L (ref 3.5–5.1)
Sodium: 138 mmol/L (ref 135–145)
Total Bilirubin: 0.3 mg/dL (ref 0.3–1.2)
Total Protein: 6.3 g/dL — ABNORMAL LOW (ref 6.5–8.1)

## 2022-04-17 LAB — URINALYSIS, ROUTINE W REFLEX MICROSCOPIC
Bacteria, UA: NONE SEEN
Bilirubin Urine: NEGATIVE
Glucose, UA: NEGATIVE mg/dL
Ketones, ur: NEGATIVE mg/dL
Leukocytes,Ua: NEGATIVE
Nitrite: NEGATIVE
Protein, ur: 100 mg/dL — AB
Specific Gravity, Urine: >1.046 — ABNORMAL HIGH (ref 1.005–1.030)
pH: 5 (ref 5.0–8.0)

## 2022-04-17 LAB — PROTIME-INR
INR: 1 (ref 0.8–1.2)
Prothrombin Time: 12.8 seconds (ref 11.4–15.2)

## 2022-04-17 LAB — CREATININE, SERUM
Creatinine, Ser: 0.72 mg/dL (ref 0.61–1.24)
GFR, Estimated: >60 mL/min (ref >60)

## 2022-04-17 SURGERY — CLOSED REDUCTION, PELVIS, WITH PERCUTANEOUS FIXATION
Anesthesia: General | Site: Pelvis | Laterality: Left

## 2022-04-17 MED ORDER — SPIRITUS FRUMENTI
1.0000 | Freq: Three times a day (TID) | ORAL | Status: DC
  Administered 2022-04-17 – 2022-04-21 (×12): 1 via ORAL
  Filled 2022-04-17 (×14): qty 1

## 2022-04-17 MED ORDER — HYDROMORPHONE HCL 1 MG/ML IJ SOLN
INTRAMUSCULAR | Status: AC
  Filled 2022-04-17: qty 1

## 2022-04-17 MED ORDER — ENOXAPARIN SODIUM 40 MG/0.4ML IJ SOSY
40.0000 mg | PREFILLED_SYRINGE | INTRAMUSCULAR | Status: DC
  Administered 2022-04-18: 40 mg via SUBCUTANEOUS
  Filled 2022-04-17: qty 0.4

## 2022-04-17 MED ORDER — HYDROMORPHONE HCL 1 MG/ML IJ SOLN: 1.0000 mg | INTRAMUSCULAR | Status: DC | PRN

## 2022-04-17 MED ORDER — TRANEXAMIC ACID-NACL 1000-0.7 MG/100ML-% IV SOLN
1000.0000 mg | Freq: Once | INTRAVENOUS | Status: AC
  Administered 2022-04-17: 1000 mg via INTRAVENOUS
  Filled 2022-04-17: qty 100

## 2022-04-17 MED ORDER — FENTANYL CITRATE (PF) 250 MCG/5ML IJ SOLN
INTRAMUSCULAR | Status: DC | PRN
  Administered 2022-04-17: 50 ug via INTRAVENOUS
  Administered 2022-04-17: 200 ug via INTRAVENOUS

## 2022-04-17 MED ORDER — MIDAZOLAM HCL 2 MG/2ML IJ SOLN
INTRAMUSCULAR | Status: DC | PRN
  Administered 2022-04-17: 2 mg via INTRAVENOUS

## 2022-04-17 MED ORDER — PHENYLEPHRINE 80 MCG/ML (10ML) SYRINGE FOR IV PUSH (FOR BLOOD PRESSURE SUPPORT)
PREFILLED_SYRINGE | INTRAVENOUS | Status: AC
  Filled 2022-04-17: qty 10

## 2022-04-17 MED ORDER — MIDAZOLAM HCL 2 MG/2ML IJ SOLN
INTRAMUSCULAR | Status: AC
  Filled 2022-04-17: qty 2

## 2022-04-17 MED ORDER — LIDOCAINE 2% (20 MG/ML) 5 ML SYRINGE
INTRAMUSCULAR | Status: DC | PRN
  Administered 2022-04-17: 80 mg via INTRAVENOUS

## 2022-04-17 MED ORDER — METHOCARBAMOL 1000 MG/10ML IJ SOLN
1000.0000 mg | Freq: Three times a day (TID) | INTRAVENOUS | Status: DC
  Administered 2022-04-17 (×2): 1000 mg via INTRAVENOUS
  Filled 2022-04-17 (×5): qty 10

## 2022-04-17 MED ORDER — OXYCODONE HCL 5 MG PO TABS: 10.0000 mg | ORAL_TABLET | ORAL | Status: DC | PRN

## 2022-04-17 MED ORDER — EPHEDRINE 5 MG/ML INJ
INTRAVENOUS | Status: AC
  Filled 2022-04-17: qty 10

## 2022-04-17 MED ORDER — KETOROLAC TROMETHAMINE 15 MG/ML IJ SOLN
15.0000 mg | Freq: Three times a day (TID) | INTRAMUSCULAR | Status: DC
  Administered 2022-04-17 – 2022-04-21 (×12): 15 mg via INTRAVENOUS
  Filled 2022-04-17 (×12): qty 1

## 2022-04-17 MED ORDER — SUGAMMADEX SODIUM 200 MG/2ML IV SOLN
INTRAVENOUS | Status: DC | PRN
  Administered 2022-04-17: 200 mg via INTRAVENOUS

## 2022-04-17 MED ORDER — DEXMEDETOMIDINE HCL IN NACL 80 MCG/20ML IV SOLN
INTRAVENOUS | Status: DC | PRN
  Administered 2022-04-17: 8 ug via BUCCAL

## 2022-04-17 MED ORDER — PROPOFOL 10 MG/ML IV BOLUS
INTRAVENOUS | Status: AC
  Filled 2022-04-17: qty 20

## 2022-04-17 MED ORDER — CEFAZOLIN SODIUM-DEXTROSE 2-4 GM/100ML-% IV SOLN
2.0000 g | Freq: Three times a day (TID) | INTRAVENOUS | Status: AC
  Administered 2022-04-17 – 2022-04-18 (×3): 2 g via INTRAVENOUS
  Filled 2022-04-17 (×3): qty 100

## 2022-04-17 MED ORDER — EPHEDRINE 5 MG/ML INJ
INTRAVENOUS | Status: AC
  Filled 2022-04-17: qty 5

## 2022-04-17 MED ORDER — FENTANYL CITRATE (PF) 250 MCG/5ML IJ SOLN
INTRAMUSCULAR | Status: AC
  Filled 2022-04-17: qty 5

## 2022-04-17 MED ORDER — DOCUSATE SODIUM 100 MG PO CAPS
100.0000 mg | ORAL_CAPSULE | Freq: Two times a day (BID) | ORAL | Status: DC
  Administered 2022-04-17 – 2022-04-21 (×8): 100 mg via ORAL
  Filled 2022-04-17 (×8): qty 1

## 2022-04-17 MED ORDER — CEFAZOLIN SODIUM-DEXTROSE 2-4 GM/100ML-% IV SOLN
INTRAVENOUS | Status: AC
  Filled 2022-04-17: qty 100

## 2022-04-17 MED ORDER — LIDOCAINE 2% (20 MG/ML) 5 ML SYRINGE
INTRAMUSCULAR | Status: AC
  Filled 2022-04-17: qty 5

## 2022-04-17 MED ORDER — LIDOCAINE 2% (20 MG/ML) 5 ML SYRINGE: INTRAMUSCULAR | Status: DC | PRN

## 2022-04-17 MED ORDER — METOCLOPRAMIDE HCL 10 MG PO TABS: 5.0000 mg | ORAL_TABLET | Freq: Three times a day (TID) | ORAL | Status: DC | PRN

## 2022-04-17 MED ORDER — DEXAMETHASONE SODIUM PHOSPHATE 10 MG/ML IJ SOLN
INTRAMUSCULAR | Status: DC | PRN
  Administered 2022-04-17: 10 mg via INTRAVENOUS

## 2022-04-17 MED ORDER — PROPOFOL 10 MG/ML IV BOLUS
INTRAVENOUS | Status: DC | PRN
  Administered 2022-04-17: 150 mg via INTRAVENOUS

## 2022-04-17 MED ORDER — 0.9 % SODIUM CHLORIDE (POUR BTL) OPTIME
TOPICAL | Status: DC | PRN
  Administered 2022-04-17: 1000 mL

## 2022-04-17 MED ORDER — OXYCODONE HCL 5 MG PO TABS
5.0000 mg | ORAL_TABLET | ORAL | Status: DC | PRN
  Administered 2022-04-18 – 2022-04-21 (×3): 10 mg via ORAL
  Filled 2022-04-17: qty 2
  Filled 2022-04-17 (×2): qty 1
  Filled 2022-04-17: qty 2

## 2022-04-17 MED ORDER — ONDANSETRON HCL 4 MG/2ML IJ SOLN
INTRAMUSCULAR | Status: AC
  Filled 2022-04-17: qty 2

## 2022-04-17 MED ORDER — ACETAMINOPHEN 500 MG PO TABS
1000.0000 mg | ORAL_TABLET | Freq: Four times a day (QID) | ORAL | Status: DC
  Administered 2022-04-17 – 2022-04-21 (×12): 1000 mg via ORAL
  Filled 2022-04-17 (×15): qty 2

## 2022-04-17 MED ORDER — HYDROMORPHONE HCL 1 MG/ML IJ SOLN
0.2500 mg | INTRAMUSCULAR | Status: DC | PRN
  Administered 2022-04-17 (×2): 0.5 mg via INTRAVENOUS

## 2022-04-17 MED ORDER — LIDOCAINE-EPINEPHRINE (PF) 2 %-1:200000 IJ SOLN
10.0000 mL | Freq: Once | INTRAMUSCULAR | Status: AC
  Administered 2022-04-17: 10 mL via INTRADERMAL
  Filled 2022-04-17: qty 20

## 2022-04-17 MED ORDER — EPHEDRINE SULFATE-NACL 50-0.9 MG/10ML-% IV SOSY
PREFILLED_SYRINGE | INTRAVENOUS | Status: DC | PRN
  Administered 2022-04-17 (×2): 10 mg via INTRAVENOUS

## 2022-04-17 MED ORDER — METOCLOPRAMIDE HCL 5 MG/ML IJ SOLN: 5.0000 mg | Freq: Three times a day (TID) | INTRAMUSCULAR | Status: DC | PRN

## 2022-04-17 MED ORDER — ROCURONIUM BROMIDE 10 MG/ML (PF) SYRINGE
PREFILLED_SYRINGE | INTRAVENOUS | Status: DC | PRN
  Administered 2022-04-17: 50 mg via INTRAVENOUS

## 2022-04-17 MED ORDER — POLYETHYLENE GLYCOL 3350 17 G PO PACK: 17.0000 g | PACK | Freq: Every day | ORAL | Status: DC | PRN

## 2022-04-17 MED ORDER — PHENYLEPHRINE 80 MCG/ML (10ML) SYRINGE FOR IV PUSH (FOR BLOOD PRESSURE SUPPORT)
PREFILLED_SYRINGE | INTRAVENOUS | Status: DC | PRN
  Administered 2022-04-17: 80 ug via INTRAVENOUS

## 2022-04-17 MED ORDER — HYDROMORPHONE HCL 1 MG/ML IJ SOLN: 0.5000 mg | INTRAMUSCULAR | Status: DC | PRN

## 2022-04-17 MED ORDER — DEXAMETHASONE SODIUM PHOSPHATE 10 MG/ML IJ SOLN
INTRAMUSCULAR | Status: AC
  Filled 2022-04-17: qty 1

## 2022-04-17 MED ORDER — CEFAZOLIN SODIUM-DEXTROSE 2-3 GM-%(50ML) IV SOLR
INTRAVENOUS | Status: DC | PRN
  Administered 2022-04-17: 2 g via INTRAVENOUS

## 2022-04-17 SURGICAL SUPPLY — 60 items
BAG COUNTER SPONGE SURGICOUNT (BAG) ×3 IMPLANT
BIT DRILL CANN 4.5MM (BIT) ×2 IMPLANT
BLADE CLIPPER SURG (BLADE) IMPLANT
BLADE SURG 11 STRL SS (BLADE) ×1 IMPLANT
BRUSH SCRUB EZ PLAIN DRY (MISCELLANEOUS) ×4 IMPLANT
CHLORAPREP W/TINT 26 (MISCELLANEOUS) ×1 IMPLANT
DERMABOND ADVANCED .7 DNX12 (GAUZE/BANDAGES/DRESSINGS) ×1 IMPLANT
DRAIN CHANNEL 15F RND FF W/TCR (WOUND CARE) IMPLANT
DRAPE C-ARM 42X72 X-RAY (DRAPES) ×3 IMPLANT
DRAPE C-ARMOR (DRAPES) ×2 IMPLANT
DRAPE HALF SHEET 40X57 (DRAPES) ×1 IMPLANT
DRAPE INCISE IOBAN 66X45 STRL (DRAPES) ×1 IMPLANT
DRAPE LAPAROTOMY TRNSV 102X78 (DRAPES) ×2 IMPLANT
DRAPE SURG 17X23 STRL (DRAPES) ×8 IMPLANT
DRAPE U-SHAPE 47X51 STRL (DRAPES) ×3 IMPLANT
DRESSING MEPILEX FLEX 4X4 (GAUZE/BANDAGES/DRESSINGS) ×2 IMPLANT
DRSG MEPILEX BORDER 4X4 (GAUZE/BANDAGES/DRESSINGS) IMPLANT
DRSG MEPILEX BORDER 4X8 (GAUZE/BANDAGES/DRESSINGS) IMPLANT
DRSG MEPILEX FLEX 4X4 (GAUZE/BANDAGES/DRESSINGS) ×4
ELECT REM PT RETURN 9FT ADLT (ELECTROSURGICAL) ×2
ELECTRODE REM PT RTRN 9FT ADLT (ELECTROSURGICAL) ×3 IMPLANT
EVACUATOR SILICONE 100CC (DRAIN) IMPLANT
GLOVE BIO SURGEON STRL SZ 6.5 (GLOVE) ×3 IMPLANT
GLOVE BIO SURGEON STRL SZ7.5 (GLOVE) ×6 IMPLANT
GLOVE BIO SURGEON STRL SZ8 (GLOVE) ×2 IMPLANT
GLOVE BIOGEL PI IND STRL 6.5 (GLOVE) ×1 IMPLANT
GLOVE BIOGEL PI IND STRL 7.5 (GLOVE) ×3 IMPLANT
GLOVE BIOGEL PI IND STRL 8 (GLOVE) ×2 IMPLANT
GLOVE SURG ORTHO LTX SZ7.5 (GLOVE) ×4 IMPLANT
GOWN STRL REUS W/ TWL LRG LVL3 (GOWN DISPOSABLE) ×6 IMPLANT
GOWN STRL REUS W/ TWL XL LVL3 (GOWN DISPOSABLE) ×2 IMPLANT
GOWN STRL REUS W/TWL LRG LVL3 (GOWN DISPOSABLE) ×4
GOWN STRL REUS W/TWL XL LVL3 (GOWN DISPOSABLE) ×2
GUIDEWIRE 2.0MM (WIRE) ×2 IMPLANT
GUIDEWIRE 2.8 THREAD 450 L ST (WIRE) ×2 IMPLANT
KIT BASIN OR (CUSTOM PROCEDURE TRAY) ×3 IMPLANT
KIT TURNOVER KIT B (KITS) ×3 IMPLANT
MANIFOLD NEPTUNE II (INSTRUMENTS) ×3 IMPLANT
NS IRRIG 1000ML POUR BTL (IV SOLUTION) ×4 IMPLANT
PACK TOTAL JOINT (CUSTOM PROCEDURE TRAY) ×3 IMPLANT
PACK UNIVERSAL I (CUSTOM PROCEDURE TRAY) ×2 IMPLANT
PAD ARMBOARD 7.5X6 YLW CONV (MISCELLANEOUS) ×7 IMPLANT
SCREW CANN 7.5X165 LT STL (Screw) ×1 IMPLANT
SCREW CANN FT 7.5X75 (Screw) ×1 IMPLANT
SPONGE T-LAP 18X18 ~~LOC~~+RFID (SPONGE) IMPLANT
STAPLER VISISTAT 35W (STAPLE) ×3 IMPLANT
SUCTION FRAZIER HANDLE 10FR (MISCELLANEOUS) ×2
SUCTION TUBE FRAZIER 10FR DISP (MISCELLANEOUS) ×3 IMPLANT
SUT MNCRL AB 3-0 PS2 18 (SUTURE) ×1 IMPLANT
SUT MON AB 2-0 CT1 36 (SUTURE) ×1 IMPLANT
SUT VIC AB 0 CT1 27 (SUTURE) ×2
SUT VIC AB 0 CT1 27XBRD ANBCTR (SUTURE) ×3 IMPLANT
SUT VIC AB 1 CT1 18XCR BRD 8 (SUTURE) ×3 IMPLANT
SUT VIC AB 1 CT1 8-18 (SUTURE) ×2
SUT VIC AB 2-0 CT1 27 (SUTURE) ×2
SUT VIC AB 2-0 CT1 TAPERPNT 27 (SUTURE) ×3 IMPLANT
TOWEL GREEN STERILE (TOWEL DISPOSABLE) ×4 IMPLANT
TOWEL GREEN STERILE FF (TOWEL DISPOSABLE) ×2 IMPLANT
TRAY FOLEY MTR SLVR 16FR STAT (SET/KITS/TRAYS/PACK) IMPLANT
WATER STERILE IRR 1000ML POUR (IV SOLUTION) ×6 IMPLANT

## 2022-04-17 NOTE — Anesthesia Preprocedure Evaluation (Addendum)
Anesthesia Evaluation  Patient identified by MRN, date of birth, ID band Patient awake    Reviewed: Allergy & Precautions, H&P , NPO status , Patient's Chart, lab work & pertinent test results, reviewed documented beta blocker date and time   Airway Mallampati: I  TM Distance: >3 FB Neck ROM: Full    Dental no notable dental hx. (+) Teeth Intact, Dental Advisory Given   Pulmonary COPD,  COPD inhaler, former smoker   Pulmonary exam normal breath sounds clear to auscultation       Cardiovascular negative cardio ROS  Rhythm:Regular Rate:Normal     Neuro/Psych Seizures -, Well Controlled,    Depression       GI/Hepatic ,GERD  Medicated,,(+)     substance abuse  alcohol use  Endo/Other  negative endocrine ROS    Renal/GU negative Renal ROS  negative genitourinary   Musculoskeletal   Abdominal   Peds  Hematology  (+) Blood dyscrasia, anemia   Anesthesia Other Findings   Reproductive/Obstetrics negative OB ROS                             Anesthesia Physical Anesthesia Plan  ASA: 2  Anesthesia Plan: General   Post-op Pain Management: Tylenol PO (pre-op)*   Induction: Intravenous  PONV Risk Score and Plan: 3 and Ondansetron, Dexamethasone and Midazolam  Airway Management Planned: Oral ETT  Additional Equipment:   Intra-op Plan:   Post-operative Plan: Extubation in OR  Informed Consent: I have reviewed the patients History and Physical, chart, labs and discussed the procedure including the risks, benefits and alternatives for the proposed anesthesia with the patient or authorized representative who has indicated his/her understanding and acceptance.     Dental advisory given  Plan Discussed with: CRNA  Anesthesia Plan Comments:        Anesthesia Quick Evaluation

## 2022-04-17 NOTE — Progress Notes (Addendum)
Orthopaedic Trauma Service   Pt seen and evaluated  Full consult to follow  Motor and sensory functions grossly intact B   + EHL and ankle extension B and symmetric  + DP/PT pulses bilaterally and symmetric Abrasion R knee  Mild swelling R flank, possible degloving but no significant ecchymosis noted at this time   B UEx are unremarkable.  Full AROM all joints.  Motor and sensory functions intact    Unstable L pelvic ring fracture (LC2) R knee effusion--> ? Patella fracture of superolateral patella  Will need surgical stabilization of his pelvis Possibly later today but suspect it will be tomorrow due to more acute polytrauma   Pt is o/w stable No evidence of EtOH withdrawal yet  Keep NPO for now   Bed rest  Titrate pain control   Added scheduled toradol and robaxin  Increased dilaudid to 1mg  q2h IV   Did not add tylenol due to elevated LFTs   , PA-C [phone] (C) 04/17/2022, 12:41 PM  [facility] [address] [place] [phone] (F)       Patient ID: Gary Mclaughlin, male   DOB: Oct 05, 1970, 51 y.o.   MRN: [id number]

## 2022-04-17 NOTE — Consult Note (Signed)
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 1732 04/16/22 1935 04/16/22 1944 04/17/22 0045  WBC 5.3 8.0  --  14.6*  HGB 12.0* 11.5* 11.9* 10.5*  HCT 36.2* 33.5* 35.0* 30.5*  PLT 412* 507*  --  479*   Recent Labs    04/13/22 1732 04/13/22 1820 04/16/22 1935 04/16/22 1944 04/17/22 0045  NA 133* 135 139 137 138  K 6.4* 4.2 4.0 4.0 4.0  CL 102 102 103 102 106  CO2 22 24 24   --  23  BUN 8 8 9 10 9   CREATININE 0.86 0.81 0.71 1.10 0.64  GLUCOSE 91 90 103* 101* 107*  CALCIUM 9.3 9.1 9.6  --  9.4   Lab Results  Component Value Date   INR 1.0 04/17/2022   INR 0.9 04/16/2022   INR 1.0 04/03/2022        Physical Examination  Patient is a 51 y.o. year old male who is alert, well appearing, and in no distress, mood is calm.  Orientation: oriented to person, place, time, and general circumstances  Vital Signs: BP (!) 139/90 (BP Location: Right Arm)   Pulse 75   Temp (!) 97 F (36.1 C) (Temporal)   Resp 16   SpO2 96%    Gait: Unable to ambulate with pain and injury, supine on stretcher  Heart: Normal rate Lungs: Non-labored breathing Abdomen: Soft, Non-tender   Right Upper Extremity: Inspection: Atraumatic Palpation: Nontender ROM: Full,  painless Strength: Normal Sensation: Intact to light touch distally Skin: Intact Peripheral Vascular: Well perfused Joint Stability: No instability Reflexes: No pathologic Lymph Nodes: None Palpable Coordination: Intact, normal   Left Upper Extremity: Inspection: Atraumatic Palpation: Nontender ROM: Full, painless Strength: Normal Sensation: Intact to light touch distally Skin: Intact Peripheral Vascular: Well perfused Joint Stability: No instability Reflexes: No pathologic Lymph Nodes: None Palpable Coordination: Intact, normal    Right Lower Extremity: Inspection: Abrasion over the knee Palpation: Only tenderness corresponding to abrasion ROM: Normal knee and ankle motion without pain Strength: Normal dorsiflexion, plantarflexion, and EHL strength and function Sensation: Sensation intact to light touch in the superficial peroneal, deep peroneal, and tibial distributions Skin: Abrasion over knee Peripheral Vascular: Normal DP pulse, warm and well-perfused distally Joint Stability: Knee stable to varus and valgus stress, stable Lachman Reflexes: No pathologic Lymph Nodes: None Palpable Coordination: Limited by pain and injury   Left Lower Extremity: Inspection: Abrasion over the knee Palpation: Only tenderness corresponding to abrasion ROM: Normal knee and ankle motion without pain Strength: Normal dorsiflexion, plantarflexion, and EHL strength and function Sensation: Sensation intact to light touch in the superficial peroneal, deep peroneal, and tibial distributions Skin: Abrasion over knee Peripheral Vascular: Normal DP pulse, warm and well-perfused distally Joint Stability: Knee stable to varus and valgus stress, stable Lachman Reflexes: No pathologic Lymph Nodes: None Palpable Coordination: Limited by pain and injury  Pelvis: Skin: Intact Palpation: Tender to palpation  over left anterior pelvis and left SI joint Stability: Severe pain with internal compression       The review of the patient's medications does not in any way constitute an endorsement, by this clinician,  of their use, dosage, indications, route, efficacy, interactions, or other clinical parameters.  This note was generated within the [employer] EMR using Dragon medical speech recognition software and may contain inherent errors or omissions not intended by the user. Grammatical and punctuation errors, random word insertions, deletions, pronoun errors and incomplete sentences are occasional consequences of this technology due to software limitations. Not all errors are caught or corrected.  Although every attempt is made to root out erroneus and incomplete transcription, the note may still not fully represent the intent or opinion of the author. If there are questions or concerns about the content of this note or information contained within the body of this dictation they should be addressed directly with the author for clarification.

## 2022-04-17 NOTE — Interval H&P Note (Signed)
History and Physical Interval Note:  04/17/2022 3:06 PM  Gary Mclaughlin  has presented today for surgery, with the diagnosis of LEFT PELVIC FRACTURE.  The various methods of treatment have been discussed with the patient and family. After consideration of risks, benefits and other options for treatment, the patient has consented to  Procedure(s): SI SCREW AND ORIF SYMPHYSIS PELVIC RING (Left) as a surgical intervention.  The patient's history has been reviewed, patient examined, no change in status, stable for surgery.  I have reviewed the patient's chart and labs.  Questions were answered to the patient's satisfaction.     Caryn Bee P Demaris Leavell

## 2022-04-17 NOTE — Transfer of Care (Signed)
Immediate Anesthesia Transfer of Care Note  Patient: Gary Mclaughlin  Procedure(s) Performed: SI SCREW AND ORIF SYMPHYSIS PELVIC RING (Left: Pelvis)  Patient Location: PACU  Anesthesia Type:General  Level of Consciousness: drowsy and patient cooperative  Airway & Oxygen Therapy: Patient Spontanous Breathing and Patient connected to face mask oxygen  Post-op Assessment: Report given to RN and Post -op Vital signs reviewed and stable  Post vital signs: Reviewed and stable  Last Vitals:  Vitals Value Taken Time  BP 114/71 04/17/22 1655  Temp    Pulse 76 04/17/22 1657  Resp 16 04/17/22 1657  SpO2 97 % 04/17/22 1657  Vitals shown include unvalidated device data.  Last Pain:  Vitals:   04/17/22 1442  TempSrc: Oral  PainSc:          Complications: No notable events documented.
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
## 2022-04-17 NOTE — Anesthesia Postprocedure Evaluation (Signed)
Anesthesia Post Note  Patient: Derreck Rauber  Procedure(s) Performed: SI SCREW AND ORIF SYMPHYSIS PELVIC RING (Left: Pelvis)     Patient location during evaluation: PACU Anesthesia Type: General Level of consciousness: awake and alert Pain management: pain level controlled Vital Signs Assessment: post-procedure vital signs reviewed and stable Respiratory status: spontaneous breathing, nonlabored ventilation, respiratory function stable and patient connected to nasal cannula oxygen Cardiovascular status: blood pressure returned to baseline and stable Postop Assessment: no apparent nausea or vomiting Anesthetic complications: no  No notable events documented.  Last Vitals:  Vitals:   04/17/22 1655 04/17/22 1710  BP: 114/71 112/74  Pulse: 79 87  Resp: 16 15  Temp: 37.1 C   SpO2: 98% 95%    Last Pain:  Vitals:   04/17/22 1710  TempSrc:   PainSc: 5                  Verma Grothaus,W. EDMOND

## 2022-04-17 NOTE — ED Notes (Signed)
Pt cleaned. Full linen change. Pt resting comfortably.

## 2022-04-17 NOTE — ED Notes (Signed)
MD at bedside suturing pt's right forehead lac.

## 2022-04-17 NOTE — ED Provider Notes (Signed)
..  Laceration Repair  Date/Time: 04/17/2022 3:05 AM  Performed by: Melene Plan, DO Authorized by: Melene Plan, DO   Consent:    Consent obtained:  Verbal   Consent given by:  Patient   Risks, benefits, and alternatives were discussed: yes     Risks discussed:  Infection, pain, poor cosmetic result and poor wound healing   Alternatives discussed:  No treatment Universal protocol:    Procedure explained and questions answered to patient or proxy's satisfaction: yes     Immediately prior to procedure, a time out was called: yes     Patient identity confirmed:  Verbally with patient Anesthesia:    Anesthesia method:  Local infiltration   Local anesthetic:  Lidocaine 2% WITH epi Laceration details:    Location:  Face   Face location:  Forehead   Length (cm):  8 Pre-procedure details:    Preparation:  Imaging obtained to evaluate for foreign bodies Exploration:    Limited defect created (wound extended): no     Hemostasis achieved with:  Direct pressure and epinephrine   Imaging obtained comment:  CT   Contaminated: no   Treatment:    Area cleansed with:  Chlorhexidine   Amount of cleaning:  Standard   Irrigation solution:  Sterile saline   Irrigation volume:  50   Irrigation method:  Pressure wash and syringe   Visualized foreign bodies/material removed: no     Scar revision: no   Skin repair:    Repair method:  Sutures   Suture size:  5-0   Suture material:  Fast-absorbing gut   Suture technique:  Simple interrupted   Number of sutures:  8 Approximation:    Approximation:  Close Repair type:    Repair type:  Simple Post-procedure details:    Dressing:  Antibiotic ointment and adhesive bandage   Procedure completion:  Tolerated well, no immediate complications     Melene Plan, DO 04/17/22 3419
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
## 2022-04-17 NOTE — Anesthesia Procedure Notes (Signed)
Procedure Name: Intubation Date/Time: 04/17/2022 3:38 PM  Performed by: Orlin Hilding, CRNAPre-anesthesia Checklist: Patient identified, Emergency Drugs available, Suction available, Patient being monitored and Timeout performed Patient Re-evaluated:Patient Re-evaluated prior to induction Oxygen Delivery Method: Circle system utilized Preoxygenation: Pre-oxygenation with 100% oxygen Induction Type: IV induction Ventilation: Mask ventilation without difficulty Grade View: Grade I Tube type: Oral Tube size: 7.5 mm Number of attempts: 1 Placement Confirmation: ETT inserted through vocal cords under direct vision, positive ETCO2 and breath sounds checked- equal and bilateral Secured at: 23 cm Tube secured with: Tape Dental Injury: Teeth and Oropharynx as per pre-operative assessment
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
## 2022-04-17 NOTE — TOC CAGE-AID Note (Signed)
Transition of Care [facility]) - CAGE-AID Screening   Patient Details  Name: Gary Mclaughlin MRN: [id number] Date of Birth: November 14, 1970   Hewitt Shorts, RN Trauma Response Nurse Phone Number: [phone] 04/17/2022, 5:00 PM   CAGE-AID Screening:    Have You Ever Felt You Ought to Cut Down on Your Drinking or Drug Use?: Yes Have People Annoyed You By Critizing Your Drinking Or Drug Use?: Yes Have You Felt Bad Or Guilty About Your Drinking Or Drug Use?: No Have You Ever Had a Drink or Used Drugs First Thing In The Morning to Steady Your Nerves or to Get Rid of a Hangover?: Yes CAGE-AID Score: 3  Substance Abuse Education Offered: No (Pt does not want any information- it was offered. He states he drinks as much as he can all day long. CIWA protocal in place)

## 2022-04-17 NOTE — Plan of Care (Signed)
  Problem: Education: Goal: Knowledge of General Education information will improve Description Including pain rating scale, medication(s)/side effects and non-pharmacologic comfort measures Outcome: Progressing   
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
## 2022-04-18 ENCOUNTER — Encounter (HOSPITAL_COMMUNITY): Admission: EM | Disposition: A | Discharge status: Home / Self Care | Payer: Self-pay | Source: Home / Self Care

## 2022-04-18 LAB — BASIC METABOLIC PANEL
Anion gap: 8 (ref 5–15)
BUN: 14 mg/dL (ref 6–20)
CO2: 23 mmol/L (ref 22–32)
Calcium: 8.8 mg/dL — ABNORMAL LOW (ref 8.9–10.3)
Chloride: 103 mmol/L (ref 98–111)
Creatinine, Ser: 0.95 mg/dL (ref 0.61–1.24)
GFR, Estimated: >60 mL/min (ref >60)
Glucose, Bld: 105 mg/dL — ABNORMAL HIGH (ref 70–99)
Potassium: 4.6 mmol/L (ref 3.5–5.1)
Sodium: 134 mmol/L — ABNORMAL LOW (ref 135–145)

## 2022-04-18 LAB — CBC
HCT: 27.9 % — ABNORMAL LOW (ref 39.0–52.0)
Hemoglobin: 9.6 g/dL — ABNORMAL LOW (ref 13.0–17.0)
MCH: 34.9 pg — ABNORMAL HIGH (ref 26.0–34.0)
MCHC: 34.4 g/dL (ref 30.0–36.0)
MCV: 101.5 fL — ABNORMAL HIGH (ref 80.0–100.0)
Platelets: 426 10*3/uL — ABNORMAL HIGH (ref 150–400)
RBC: 2.75 MIL/uL — ABNORMAL LOW (ref 4.22–5.81)
RDW: 12.7 % (ref 11.5–15.5)
WBC: 10.1 10*3/uL (ref 4.0–10.5)
nRBC: 0 % (ref 0.0–0.2)

## 2022-04-18 LAB — VITAMIN D 25 HYDROXY (VIT D DEFICIENCY, FRACTURES): Vit D, 25-Hydroxy: 25.09 ng/mL — ABNORMAL LOW (ref 30–100)

## 2022-04-18 SURGERY — OPEN REDUCTION INTERNAL FIXATION (ORIF) DISTAL RADIUS FRACTURE
Anesthesia: General | Laterality: Right

## 2022-04-18 MED ORDER — METHOCARBAMOL 500 MG PO TABS
1000.0000 mg | ORAL_TABLET | Freq: Three times a day (TID) | ORAL | Status: DC
  Administered 2022-04-18 – 2022-04-20 (×7): 1000 mg via ORAL
  Filled 2022-04-18 (×7): qty 2

## 2022-04-18 MED ORDER — NICOTINE 14 MG/24HR TD PT24
14.0000 mg | MEDICATED_PATCH | Freq: Every day | TRANSDERMAL | Status: DC
  Administered 2022-04-18 – 2022-04-21 (×5): 14 mg via TRANSDERMAL
  Filled 2022-04-18 (×5): qty 1

## 2022-04-18 MED ORDER — ENOXAPARIN SODIUM 30 MG/0.3ML IJ SOSY
30.0000 mg | PREFILLED_SYRINGE | Freq: Two times a day (BID) | INTRAMUSCULAR | Status: DC
  Administered 2022-04-18 – 2022-04-21 (×6): 30 mg via SUBCUTANEOUS
  Filled 2022-04-18 (×6): qty 0.3

## 2022-04-18 MED ORDER — GUAIFENESIN ER 600 MG PO TB12
600.0000 mg | ORAL_TABLET | Freq: Two times a day (BID) | ORAL | Status: DC
  Administered 2022-04-18 – 2022-04-21 (×7): 600 mg via ORAL
  Filled 2022-04-18 (×7): qty 1

## 2022-04-18 MED ORDER — POLYETHYLENE GLYCOL 3350 17 G PO PACK
17.0000 g | PACK | Freq: Every day | ORAL | Status: DC
  Administered 2022-04-18 – 2022-04-21 (×4): 17 g via ORAL
  Filled 2022-04-18 (×4): qty 1
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
## 2022-04-18 NOTE — Progress Notes (Addendum)
Orthopaedic Trauma Progress Note  SUBJECTIVE: Doing ok today. Notes pain is still present in pelvis and low back but notes significant improvement from pre-op. Pain meds helping some. No numbness or tingling. No chest pain. No SOB. No nausea/vomiting. No other complaints. Has not been up out of bed yet since surgery.  OBJECTIVE:  Vitals:   04/18/22 0733 04/18/22 0734  BP:  (!) 158/97  Pulse: 74   Resp: 11   Temp:    SpO2: 92%     General:Sitting up in bed eating breakfast. NAD Respiratory: No increased work of breathing.  Pelvis/LLE: Dressings CDI. Tender over lateral hip but otherwise no significant tenderness throughout extremity. Motor and sensory function to DPN, SPN, TN intact. 2+ DP pulse. Extremity warm and well perfused.   IMAGING: Stable post op imaging.   LABS:  Results for orders placed or performed during the hospital encounter of 04/16/22 (from the past 24 hour(s))  Urinalysis, Routine w reflex microscopic     Status: Abnormal   Collection Time: 04/17/22  8:18 AM  Result Value Ref Range   Color, Urine YELLOW YELLOW   APPearance HAZY (A) CLEAR   Specific Gravity, Urine >1.046 (H) 1.005 - 1.030   pH 5.0 5.0 - 8.0   Glucose, UA NEGATIVE NEGATIVE mg/dL   Hgb urine dipstick SMALL (A) NEGATIVE   Bilirubin Urine NEGATIVE NEGATIVE   Ketones, ur NEGATIVE NEGATIVE mg/dL   Protein, ur 468 (A) NEGATIVE mg/dL   Nitrite NEGATIVE NEGATIVE   Leukocytes,Ua NEGATIVE NEGATIVE   RBC / HPF 0-5 0 - 5 RBC/hpf   WBC, UA 0-5 0 - 5 WBC/hpf   Bacteria, UA NONE SEEN NONE SEEN   Mucus PRESENT   CBC     Status: Abnormal   Collection Time: 04/17/22  6:23 PM  Result Value Ref Range   WBC 13.9 (H) 4.0 - 10.5 K/uL   RBC 3.13 (L) 4.22 - 5.81 MIL/uL   Hemoglobin 10.5 (L) 13.0 - 17.0 g/dL   HCT 03.2 (L) 12.2 - 48.2 %   MCV 101.9 (H) 80.0 - 100.0 fL   MCH 33.5 26.0 - 34.0 pg   MCHC 32.9 30.0 - 36.0 g/dL   RDW 50.0 37.0 - 48.8 %   Platelets 448 (H) 150 - 400 K/uL   nRBC 0.0 0.0 - 0.2 %   Creatinine, serum     Status: None   Collection Time: 04/17/22  6:23 PM  Result Value Ref Range   Creatinine, Ser 0.72 0.61 - 1.24 mg/dL   GFR, Estimated >89 >16 mL/min  Basic metabolic panel     Status: Abnormal   Collection Time: 04/18/22  4:54 AM  Result Value Ref Range   Sodium 134 (L) 135 - 145 mmol/L   Potassium 4.6 3.5 - 5.1 mmol/L   Chloride 103 98 - 111 mmol/L   CO2 23 22 - 32 mmol/L   Glucose, Bld 105 (H) 70 - 99 mg/dL   BUN 14 6 - 20 mg/dL   Creatinine, Ser 9.45 0.61 - 1.24 mg/dL   Calcium 8.8 (L) 8.9 - 10.3 mg/dL   GFR, Estimated >03 >88 mL/min   Anion gap 8 5 - 15  CBC     Status: Abnormal   Collection Time: 04/18/22  4:54 AM  Result Value Ref Range   WBC 10.1 4.0 - 10.5 K/uL   RBC 2.75 (L) 4.22 - 5.81 MIL/uL   Hemoglobin 9.6 (L) 13.0 - 17.0 g/dL   HCT 82.8 (L) 00.3 - 49.1 %  MCV 101.5 (H) 80.0 - 100.0 fL   MCH 34.9 (H) 26.0 - 34.0 pg   MCHC 34.4 30.0 - 36.0 g/dL   RDW 02.7 74.1 - 28.7 %   Platelets 426 (H) 150 - 400 K/uL   nRBC 0.0 0.0 - 0.2 %    ASSESSMENT: Gary Mclaughlin is a 51 y.o. male, 1 Day Post-Op s/p Percutaneous fixation of posterior pelvis Percutaneous fixation of left superior pubic ramus fracture Closed reduction of left posterior pelvis  CV/Blood loss: Acute blood loss anemia, Hgb 9.6 this AM. Hemodynamically stable  PLAN: Weightbearing: TDWB LLE. WBAT RLE ROM:  ok for ROM as tolerated  Incisional and dressing care: Reinforce dressings as needed  Showering:  ok to begin getting incisions wet 04/20/22 Orthopedic device(s): None  Pain management:  1. Tylenol 1000 mg q 6 hours scheduled 2. Robaxin 1000 mg q 8 hours 3. Oxycodone 5-15 mg q 4 hours PRN 4. Dilaudid 0.5-1 mg q 3 hours PRN 5. Toradol 15 mg q 8 hours VTE prophylaxis: Lovenox, SCDs ID:  Ancef 2gm post op Foley/Lines:  No foley, KVO IVFs Impediments to Fracture Healing: Vit D level pending, will start supplementation as indicated Dispo: PT/OT eval today. Dispo pending. Follow  up on Vit D level.  D/C recommendations: - Oxycodone and Robaxin for pain control - ASA 325 mg daily x 30 days for DVT prophylaxis - Possible need for Vit D supplementation  Follow - up plan: 2 weeks after d/c for wound check and repeat x-rays   Contact information:  Truitt Merle MD, Thyra Breed PA-C. After hours and holidays please check [email or web address] for group call information for [facility]   Thompson Caul, PA-C [phone] (office) [email or web address]
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
## 2022-04-19 LAB — CBC
HCT: 28.4 % — ABNORMAL LOW (ref 39.0–52.0)
Hemoglobin: 9.7 g/dL — ABNORMAL LOW (ref 13.0–17.0)
MCH: 34.4 pg — ABNORMAL HIGH (ref 26.0–34.0)
MCHC: 34.2 g/dL (ref 30.0–36.0)
MCV: 100.7 fL — ABNORMAL HIGH (ref 80.0–100.0)
Platelets: 394 10*3/uL (ref 150–400)
RBC: 2.82 MIL/uL — ABNORMAL LOW (ref 4.22–5.81)
RDW: 12.7 % (ref 11.5–15.5)
WBC: 7.2 10*3/uL (ref 4.0–10.5)
nRBC: 0 % (ref 0.0–0.2)

## 2022-04-19 LAB — BASIC METABOLIC PANEL
Anion gap: 6 (ref 5–15)
BUN: 11 mg/dL (ref 6–20)
CO2: 25 mmol/L (ref 22–32)
Calcium: 8.8 mg/dL — ABNORMAL LOW (ref 8.9–10.3)
Chloride: 107 mmol/L (ref 98–111)
Creatinine, Ser: 0.82 mg/dL (ref 0.61–1.24)
GFR, Estimated: >60 mL/min (ref >60)
Glucose, Bld: 92 mg/dL (ref 70–99)
Potassium: 4.2 mmol/L (ref 3.5–5.1)
Sodium: 138 mmol/L (ref 135–145)
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
## 2022-04-19 NOTE — Progress Notes (Signed)
Orthopaedic Trauma Progress Note  SUBJECTIVE: Pain improving slowly, pain mainly located at lateral left thigh. No numbness or tingling. No chest pain. No SOB. No nausea/vomiting. No other complaints. Hoping to work with therapy again today.  OBJECTIVE:  Vitals:   04/19/22 0317 04/19/22 0707  BP: (!) 150/95   Pulse: 87 85  Resp: 20   Temp: 98.3 F (36.8 C) 98.1 F (36.7 C)  SpO2: 93% 93%    General:Sitting up in bed eating breakfast. NAD Respiratory: No increased work of breathing.  Pelvis/LLE: Dressings CDI. Tender over lateral hip and lateral left knee. Motor and sensory function to DPN, SPN, TN intact. 2+ DP pulse. Extremity warm and well perfused.   IMAGING: Stable post op imaging.   LABS:  Results for orders placed or performed during the hospital encounter of 04/16/22 (from the past 24 hour(s))  Basic metabolic panel     Status: Abnormal   Collection Time: 04/19/22  3:35 AM  Result Value Ref Range   Sodium 138 135 - 145 mmol/L   Potassium 4.2 3.5 - 5.1 mmol/L   Chloride 107 98 - 111 mmol/L   CO2 25 22 - 32 mmol/L   Glucose, Bld 92 70 - 99 mg/dL   BUN 11 6 - 20 mg/dL   Creatinine, Ser 5.46 0.61 - 1.24 mg/dL   Calcium 8.8 (L) 8.9 - 10.3 mg/dL   GFR, Estimated >50 >35 mL/min   Anion gap 6 5 - 15  CBC     Status: Abnormal   Collection Time: 04/19/22  3:35 AM  Result Value Ref Range   WBC 7.2 4.0 - 10.5 K/uL   RBC 2.82 (L) 4.22 - 5.81 MIL/uL   Hemoglobin 9.7 (L) 13.0 - 17.0 g/dL   HCT 46.5 (L) 68.1 - 27.5 %   MCV 100.7 (H) 80.0 - 100.0 fL   MCH 34.4 (H) 26.0 - 34.0 pg   MCHC 34.2 30.0 - 36.0 g/dL   RDW 17.0 01.7 - 49.4 %   Platelets 394 150 - 400 K/uL   nRBC 0.0 0.0 - 0.2 %    ASSESSMENT: Gary Mclaughlin is a 51 y.o. male, 2 Days Post-Op s/p Percutaneous fixation of posterior pelvis Percutaneous fixation of left superior pubic ramus fracture Closed reduction of left posterior pelvis  CV/Blood loss: Acute blood loss anemia, Hgb 9.7 this AM. Hemodynamically  stable  PLAN: Weightbearing: TDWB LLE. WBAT RLE ROM:  ok for ROM as tolerated  Incisional and dressing care: Reinforce dressings as needed  Showering:  ok to begin getting incisions wet 04/20/22 Orthopedic device(s): None  Pain management:  1. Tylenol 1000 mg q 6 hours scheduled 2. Robaxin 1000 mg q 8 hours 3. Oxycodone 5-15 mg q 4 hours PRN 4. Dilaudid 0.5-1 mg q 3 hours PRN 5. Toradol 15 mg q 8 hours VTE prophylaxis: Lovenox, SCDs ID:  Ancef 2gm post op Foley/Lines:  No foley, KVO IVFs Dispo: okay to discharge from ortho standpoint when cleared by PT/trauma  D/C recommendations: - Oxycodone and Robaxin for pain control - ASA 325 mg daily x 30 days for DVT prophylaxis  Follow - up plan: 2 weeks after d/c for wound check and repeat x-rays  Janine Ores, PA-C

## 2022-04-20 MED ORDER — GABAPENTIN 600 MG PO TABS
300.0000 mg | ORAL_TABLET | Freq: Three times a day (TID) | ORAL | Status: DC
  Administered 2022-04-20 – 2022-04-21 (×5): 300 mg via ORAL
  Filled 2022-04-20 (×5): qty 1

## 2022-04-20 MED ORDER — METHOCARBAMOL 500 MG PO TABS
1000.0000 mg | ORAL_TABLET | Freq: Four times a day (QID) | ORAL | Status: DC
  Administered 2022-04-20 – 2022-04-21 (×6): 1000 mg via ORAL
  Filled 2022-04-20 (×6): qty 2

## 2022-04-20 MED ORDER — CHLORDIAZEPOXIDE HCL 5 MG PO CAPS
5.0000 mg | ORAL_CAPSULE | Freq: Three times a day (TID) | ORAL | Status: DC
  Administered 2022-04-20 – 2022-04-21 (×4): 5 mg via ORAL
  Filled 2022-04-20 (×4): qty 1
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
## 2022-04-21 ENCOUNTER — Other Ambulatory Visit (HOSPITAL_COMMUNITY): Payer: Self-pay

## 2022-04-21 MED ORDER — GABAPENTIN 300 MG PO CAPS
300.0000 mg | ORAL_CAPSULE | Freq: Three times a day (TID) | ORAL | 0 refills | Status: DC | PRN
  Filled 2022-04-21: qty 60, 20d supply, fill #0

## 2022-04-21 MED ORDER — ACETAMINOPHEN 500 MG PO TABS
1000.0000 mg | ORAL_TABLET | Freq: Three times a day (TID) | ORAL | 0 refills | Status: DC | PRN
  Filled 2022-04-21: qty 30, 5d supply, fill #0

## 2022-04-21 MED ORDER — OXYCODONE HCL 5 MG PO TABS
5.0000 mg | ORAL_TABLET | Freq: Four times a day (QID) | ORAL | 0 refills | Status: DC | PRN
  Filled 2022-04-21: qty 19, 3d supply, fill #0

## 2022-04-21 MED ORDER — VITAMIN D3 25 MCG PO TABS
1000.0000 [IU] | ORAL_TABLET | Freq: Every day | ORAL | 0 refills | Status: DC
  Filled 2022-04-21: qty 90, 90d supply, fill #0

## 2022-04-21 MED ORDER — DOCUSATE SODIUM 100 MG PO CAPS: 100.0000 mg | ORAL_CAPSULE | Freq: Two times a day (BID) | ORAL | 0 refills | Status: DC | PRN

## 2022-04-21 MED ORDER — VITAMIN D 25 MCG (1000 UNIT) PO TABS
1000.0000 [IU] | ORAL_TABLET | Freq: Every day | ORAL | Status: DC
  Administered 2022-04-21: 1000 [IU] via ORAL
  Filled 2022-04-21: qty 1

## 2022-04-21 MED ORDER — IBUPROFEN 200 MG PO TABS
800.0000 mg | ORAL_TABLET | Freq: Four times a day (QID) | ORAL | Status: DC
  Administered 2022-04-21: 800 mg via ORAL
  Filled 2022-04-21: qty 4

## 2022-04-21 MED ORDER — METHOCARBAMOL 500 MG PO TABS
1000.0000 mg | ORAL_TABLET | Freq: Three times a day (TID) | ORAL | 0 refills | Status: DC | PRN
  Filled 2022-04-21: qty 60, 10d supply, fill #0

## 2022-04-21 MED ORDER — ASPIRIN 325 MG PO TBEC
325.0000 mg | DELAYED_RELEASE_TABLET | Freq: Every day | ORAL | 0 refills | Status: AC
  Filled 2022-04-21: qty 30, 30d supply, fill #0

## 2022-04-21 MED ORDER — POLYETHYLENE GLYCOL 3350 17 G PO PACK: 17.0000 g | PACK | Freq: Every day | ORAL | 0 refills | Status: DC | PRN
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
## 2022-04-21 NOTE — Progress Notes (Signed)
Orthopaedic Trauma Progress Note  SUBJECTIVE: Doing ok today. Pain improving, notes it is better than last week. No numbness or tingling. No chest pain. No SOB. No nausea/vomiting. No other complaints.  OBJECTIVE:  Vitals:   04/20/22 2330 04/21/22 0330  BP: 135/87 (!) 136/98  Pulse: 78 72  Resp: 14 13  Temp: 98.2 F (36.8 C) 98.6 F (37 C)  SpO2: 94% 98%    General:Sitting up in bed. NAD Respiratory: No increased work of breathing.  Pelvis/LLE: Dressings removed, incisions CDI.  Has some mild tenderness  over lateral hip but otherwise no significant tenderness throughout extremity. Motor and sensory function to DPN, SPN, TN intact. 2+ DP pulse. Extremity warm and well perfused.   IMAGING: Stable post op imaging.   LABS:  No results found for this or any previous visit (from the past 24 hour(s)).   ASSESSMENT: Gary Mclaughlin is a 51 y.o. male, 4 Days Post-Op s/p Percutaneous fixation of posterior pelvis Percutaneous fixation of left superior pubic ramus fracture Closed reduction of left posterior pelvis  CV/Blood loss: Acute blood loss anemia, Hgb 9.7 on 04/19/22. Hemodynamically stable  PLAN: Weightbearing: TDWB LLE. WBAT RLE ROM:  ok for ROM as tolerated  Incisional and dressing care: Okay to leave incisions open to air Showering:  Ok for incisions to get wet Orthopedic device(s): None  Pain management:  1. Tylenol 1000 mg q 6 hours scheduled 2. Robaxin 1000 mg q 6 hours 3. Oxycodone 5-15 mg q 4 hours PRN 4. Dilaudid 0.5-1 mg q 3 hours PRN 5. Toradol 15 mg q 8 hours 6. Neurontin 300 mg 3 times daily VTE prophylaxis: Lovenox, SCDs ID:  Ancef 2gm post op completed Foley/Lines:  No foley, KVO IVFs Impediments to Fracture Healing: Vit D level pending, will start supplementation as indicated Dispo: PT/OT recommending HH therapies. Ok for d/c from ortho standpoint once cleared by therapies and trauma team  D/C recommendations: - Oxycodone and Robaxin for pain control -  ASA 325 mg daily x 30 days for DVT prophylaxis - Continue 1000 units daily Vit D supplementation x 90 days  Follow - up plan: 2 weeks after d/c for wound check and repeat x-rays   Contact information:  Truitt Merle MD, Thyra Breed PA-C. After hours and holidays please check [email or web address] for group call information for [facility]   Thompson Caul, PA-C [phone] (office) [email or web address]

## 2022-04-21 NOTE — Progress Notes (Signed)
Mobility Specialist Progress Note   04/21/22 1530  Therapy Vitals  Pulse Rate 78  BP (!) 161/106  Mobility  Activity Ambulated with assistance in room  Level of Assistance Minimal assist, patient does 75% or more  Assistive Device Crutches  Distance Ambulated (ft) 340 ft  RLE Weight Bearing WBAT  LLE Weight Bearing TWB  Activity Response Tolerated well  $Mobility charge 1 Mobility   Pre Mobility: 74 HR During Mobility: 106 HR, 155/109 BP Post Mobility: 78 HR, 161/106 BP  Received in chair c/o radiating pain from back of L knee traveling up to L Glute stating it to be (4/10) but agreeable to mobility. Upon standing, pt having bout of Vtach for ~15 secs but pt having no symptoms, continued w/ hallway ambulation where pt requiring minA to steady d/t rushed gait on DME. Min verbal cues on posture + proper crutch mechanics used during ambulation. Returned back to chair where pt had another bout of Vtach, vitals taken and BP presenting high but pt having no related complaints. MD and RN aware, pt left in chair w/ chair alarm on and RN present in room.    Frederico Hamman Mobility Specialist Please contact via SecureChat or  Rehab office at [phone]

## 2022-04-21 NOTE — Progress Notes (Signed)
Orthopedic Tech Progress Note Patient Details:  Gary Mclaughlin June 18, 1970 [id number]  I happened to run into [facility] and they stated patient would need a pair of crutches, going to reach out for an order for crutches   Ortho Devices Type of Ortho Device: Crutches Ortho Device/Splint Interventions: Application, Adjustment, Other (comment)   Post Interventions Patient Tolerated: Well Instructions Provided: Care of device  Donald Pore 04/21/2022, 3:20 PM

## 2022-04-21 NOTE — Discharge Instructions (Addendum)
Pelvic Ring Injury  Weightbearing: Touch Down Weight Left Lower Extremity. Weight Bearing as Tolerated to the Right Lower Extremity.  ROM:  Ok for Range of Motion as tolerated  Incisional and dressing care: Okay to leave incisions open to air Showering:  Ok for incisions to get wet Orthopedic device(s): None  VTE prophylaxis: Aspirin 325 mg daily x 30 days for DVT prophylaxis Continue 1000 units daily Vit D supplementation x 90 days Follow - up plan: 2 weeks after d/c for wound check and repeat x-rays  Call with any questions or concerns.

## 2022-04-22 ENCOUNTER — Encounter (HOSPITAL_COMMUNITY): Payer: Self-pay | Admitting: Student

## 2022-04-26 ENCOUNTER — Other Ambulatory Visit: Payer: Self-pay

## 2022-04-26 ENCOUNTER — Emergency Department (HOSPITAL_COMMUNITY)
Admission: EM | Admit: 2022-04-26 | Discharge: 2022-04-27 | Disposition: A | Discharge status: Home / Self Care | Payer: Medicaid Other | Attending: Emergency Medicine | Admitting: Emergency Medicine

## 2022-04-26 ENCOUNTER — Encounter (HOSPITAL_COMMUNITY): Payer: Self-pay

## 2022-04-26 DIAGNOSIS — R102 Pelvic and perineal pain: Secondary | ICD-10-CM | POA: Diagnosis present

## 2022-04-26 DIAGNOSIS — W1830XA Fall on same level, unspecified, initial encounter: Secondary | ICD-10-CM | POA: Insufficient documentation

## 2022-04-26 DIAGNOSIS — Z7982 Long term (current) use of aspirin: Secondary | ICD-10-CM | POA: Diagnosis not present

## 2022-04-26 DIAGNOSIS — S329XXA Fracture of unspecified parts of lumbosacral spine and pelvis, initial encounter for closed fracture: Secondary | ICD-10-CM | POA: Insufficient documentation
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
## 2022-04-27 ENCOUNTER — Emergency Department (HOSPITAL_COMMUNITY): Payer: Medicaid Other

## 2022-04-27 MED ORDER — MORPHINE SULFATE (PF) 4 MG/ML IV SOLN
6.0000 mg | Freq: Once | INTRAVENOUS | Status: AC
  Administered 2022-04-27: 6 mg via INTRAMUSCULAR
  Filled 2022-04-27: qty 2

## 2022-04-27 MED ORDER — MORPHINE SULFATE 30 MG PO TABS: 30.0000 mg | ORAL_TABLET | ORAL | Status: DC | PRN

## 2022-04-27 MED ORDER — MORPHINE SULFATE 30 MG PO TABS: 30.0000 mg | ORAL_TABLET | ORAL | 0 refills | Status: DC | PRN

## 2022-04-27 NOTE — ED Notes (Signed)
Pt requesting to use bedpan. RN encouraged him to ambulate to restroom. He asked why he was in so much pain. RN reassured him xray did not show signs of anything different since he had his surgery. Pt used crutches and got to bathroom with no assistance.
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
## 2022-04-27 NOTE — ED Notes (Signed)
Rn called radiology to let them know to come get pt in triage

## 2022-05-13 ENCOUNTER — Emergency Department (HOSPITAL_COMMUNITY)
Admission: EM | Admit: 2022-05-13 | Discharge: 2022-05-13 | Disposition: A | Discharge status: Home / Self Care | Payer: Medicaid Other | Attending: Emergency Medicine | Admitting: Emergency Medicine

## 2022-05-13 ENCOUNTER — Emergency Department (HOSPITAL_COMMUNITY): Payer: Medicaid Other

## 2022-05-13 ENCOUNTER — Other Ambulatory Visit: Payer: Self-pay

## 2022-05-13 DIAGNOSIS — Z7982 Long term (current) use of aspirin: Secondary | ICD-10-CM | POA: Insufficient documentation

## 2022-05-13 DIAGNOSIS — M25552 Pain in left hip: Secondary | ICD-10-CM | POA: Diagnosis not present

## 2022-05-13 DIAGNOSIS — R102 Pelvic and perineal pain: Secondary | ICD-10-CM

## 2022-05-13 MED ORDER — LIDOCAINE 5 % EX PTCH
1.0000 | MEDICATED_PATCH | Freq: Once | CUTANEOUS | Status: DC
  Administered 2022-05-13: 1 via TRANSDERMAL
  Filled 2022-05-13: qty 1

## 2022-05-13 MED ORDER — ACETAMINOPHEN ER 650 MG PO TBCR
650.0000 mg | EXTENDED_RELEASE_TABLET | Freq: Three times a day (TID) | ORAL | 0 refills | Status: DC | PRN
  Filled 2022-05-13: qty 30, 10d supply, fill #0

## 2022-05-13 MED ORDER — IBUPROFEN 600 MG PO TABS
600.0000 mg | ORAL_TABLET | Freq: Four times a day (QID) | ORAL | 0 refills | Status: DC | PRN
  Filled 2022-05-13: qty 30, 8d supply, fill #0

## 2022-05-13 MED ORDER — IBUPROFEN 400 MG PO TABS
600.0000 mg | ORAL_TABLET | Freq: Once | ORAL | Status: AC
  Administered 2022-05-13: 600 mg via ORAL
  Filled 2022-05-13: qty 1

## 2022-05-13 MED ORDER — ACETAMINOPHEN 500 MG PO TABS
1000.0000 mg | ORAL_TABLET | Freq: Once | ORAL | Status: AC
  Administered 2022-05-13: 1000 mg via ORAL
  Filled 2022-05-13: qty 2
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
## 2022-05-13 NOTE — ED Provider Triage Note (Signed)
Emergency Medicine Provider Triage Evaluation Note  Mckay Tegtmeyer , a 52 y.o. male  was evaluated in triage.  Pt complains of persistent pelvic pain since 12/13.   Review of Systems  Positive: Pelvic fracture Negative: fever  Physical Exam  BP 100/75   Pulse (!) 118   Temp 98.8 F (37.1 C) (Oral)   Resp (!) 22   Ht 5\' 9"  (1.753 m)   Wt 74.8 kg   SpO2 92%   BMI 24.37 kg/m  Gen:   Awake, no distress   Resp:  Normal effort  MSK:   Moves extremities without difficulty  Other:    Medical Decision Making  Medically screening exam initiated at 6:34 PM.  Appropriate orders placed.  Altariq Pelcher was informed that the remainder of the evaluation will be completed by another provider, this initial triage assessment does not replace that evaluation, and the importance of remaining in the ED until their evaluation is complete.     Fransico Meadow, [place] 05/13/22 1660

## 2022-05-13 NOTE — ED Notes (Signed)
Pt given crutches and instructed on usage.
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
## 2022-05-13 NOTE — Discharge Instructions (Addendum)
You were seen in the emergency department for your pelvic pain.  Your x-ray shows that your pelvis is healing appropriately and your hardware is in place.  You can continue to take Tylenol and Motrin as needed for pain and both can be taken up to every 6 hours.  You should follow-up with your orthopedic doctor to have your symptoms rechecked to determine if you need further workup or therapy to help with healing.  You should return to the emergency department if your pains getting significantly worse and you are unable to walk, you are having fevers, you have numbness or weakness in your leg or your leg is turning black or blue or if you have any other new or concerning symptoms.

## 2022-05-14 ENCOUNTER — Other Ambulatory Visit (HOSPITAL_COMMUNITY): Payer: Self-pay

## 2022-08-21 ENCOUNTER — Emergency Department (HOSPITAL_COMMUNITY)
Admission: EM | Admit: 2022-08-21 | Discharge: 2022-08-21 | Disposition: A | Discharge status: Home / Self Care | Payer: Medicaid Other | Attending: Emergency Medicine | Admitting: Emergency Medicine

## 2022-08-21 ENCOUNTER — Encounter (HOSPITAL_COMMUNITY): Payer: Self-pay

## 2022-08-21 DIAGNOSIS — Z5329 Procedure and treatment not carried out because of patient's decision for other reasons: Secondary | ICD-10-CM | POA: Diagnosis not present

## 2022-08-21 DIAGNOSIS — F10120 Alcohol abuse with intoxication, uncomplicated: Secondary | ICD-10-CM | POA: Diagnosis present

## 2022-08-21 DIAGNOSIS — Y909 Presence of alcohol in blood, level not specified: Secondary | ICD-10-CM | POA: Insufficient documentation

## 2022-08-21 DIAGNOSIS — Z5321 Procedure and treatment not carried out due to patient leaving prior to being seen by health care provider: Secondary | ICD-10-CM

## 2022-08-21 NOTE — ED Notes (Signed)
Pt sleeing. Resp even and unlabored. NAD

## 2022-08-21 NOTE — ED Notes (Signed)
Pt walked out, MD aware aware

## 2022-08-21 NOTE — ED Provider Notes (Signed)
[place] [facility] AT [facility] Provider Note   CSN: [id number] Arrival date & time: 08/21/22  1413     History Chief Complaint  Patient presents with   Alcohol Intoxication    HPI Gary Mclaughlin is a 52 y.o. male presenting for chief complaint of intoxication.  He was found sleeping in the back of a [employer].  Told EMS that he was just resting.  He endorsed multiple beers today.  He denies fevers chills nausea.  Patient's recorded medical, surgical, social, medication list and allergies were reviewed in the Snapshot window as part of the initial history.   Review of Systems   Review of Systems  Constitutional:  Negative for chills and fever.  HENT:  Negative for ear pain and sore throat.   Eyes:  Negative for pain and visual disturbance.  Respiratory:  Negative for cough and shortness of breath.   Cardiovascular:  Negative for chest pain and palpitations.  Gastrointestinal:  Negative for abdominal pain and vomiting.  Genitourinary:  Negative for dysuria and hematuria.  Musculoskeletal:  Negative for arthralgias and back pain.  Skin:  Negative for color change and rash.  Neurological:  Negative for seizures and syncope.  All other systems reviewed and are negative.   Physical Exam Updated Vital Signs BP 98/68   Pulse 72   Temp 98 F (36.7 C) (Oral)   Resp 16   Ht  (1.753 m)   Wt 74.8 kg   SpO2 93%   BMI 24.35 kg/m  Physical Exam Vitals and nursing note reviewed.  Constitutional:      General: He is not in acute distress.    Appearance: He is well-developed.  HENT:     Head: Normocephalic and atraumatic.  Eyes:     Conjunctiva/sclera: Conjunctivae normal.  Cardiovascular:     Rate and Rhythm: Normal rate and regular rhythm.     Heart sounds: No murmur heard. Pulmonary:     Effort: Pulmonary effort is normal. No respiratory distress.     Breath sounds: Normal breath sounds.  Abdominal:     Palpations: Abdomen is soft.      Tenderness: There is no abdominal tenderness.  Musculoskeletal:        General: No swelling.     Cervical back: Neck supple.  Skin:    General: Skin is warm and dry.     Capillary Refill: Capillary refill takes less than 2 seconds.  Neurological:     Mental Status: He is alert.  Psychiatric:        Mood and Affect: Mood normal.      ED Course/ Medical Decision Making/ A&P    Procedures Procedures   Medications Ordered in ED Medications - No data to display  Medical Decision Making:   Patient arrived for intoxication, was observed in the emergency room for 4 and half hours.  When I evaluated him, he was resting comfortably.  He stated he had no concerns and just wanted to sleep.   Per nursing, he went to the bathroom, had tolerated p.o. intake and did not tell anyone he was leaving the facility before eloping from the facility.. Based upon the presenting history of present on his physicals and findings, no acute indication for further intervention.  Clinical Impression:  1. Eloped from emergency department      Eloped   Final Clinical Impression(s) / ED Diagnoses Final diagnoses:  Eloped from emergency department    Rx / DC Orders ED Discharge Orders  None         Glyn Ade, MD 08/21/22 [phone]

## 2022-08-23 ENCOUNTER — Emergency Department (HOSPITAL_COMMUNITY): Payer: Medicaid Other

## 2022-08-23 ENCOUNTER — Other Ambulatory Visit: Payer: Self-pay

## 2022-08-23 ENCOUNTER — Encounter (HOSPITAL_COMMUNITY): Payer: Self-pay

## 2022-08-23 ENCOUNTER — Emergency Department (HOSPITAL_COMMUNITY)
Admission: EM | Admit: 2022-08-23 | Discharge: 2022-08-24 | Disposition: A | Discharge status: Home / Self Care | Payer: Medicaid Other | Attending: Emergency Medicine | Admitting: Emergency Medicine

## 2022-08-23 DIAGNOSIS — F10920 Alcohol use, unspecified with intoxication, uncomplicated: Secondary | ICD-10-CM

## 2022-08-23 DIAGNOSIS — W010XXA Fall on same level from slipping, tripping and stumbling without subsequent striking against object, initial encounter: Secondary | ICD-10-CM | POA: Diagnosis not present

## 2022-08-23 DIAGNOSIS — S60511A Abrasion of right hand, initial encounter: Secondary | ICD-10-CM | POA: Diagnosis not present

## 2022-08-23 DIAGNOSIS — Y9301 Activity, walking, marching and hiking: Secondary | ICD-10-CM | POA: Insufficient documentation

## 2022-08-23 DIAGNOSIS — F10220 Alcohol dependence with intoxication, uncomplicated: Secondary | ICD-10-CM | POA: Diagnosis not present

## 2022-08-23 DIAGNOSIS — S0081XA Abrasion of other part of head, initial encounter: Secondary | ICD-10-CM | POA: Diagnosis not present

## 2022-08-23 DIAGNOSIS — S0990XA Unspecified injury of head, initial encounter: Secondary | ICD-10-CM | POA: Diagnosis present

## 2022-08-23 DIAGNOSIS — T07XXXA Unspecified multiple injuries, initial encounter: Secondary | ICD-10-CM

## 2022-08-23 NOTE — ED Triage Notes (Signed)
Patient brought in by [facility], reports bystander saw patient walking and patient's cart started rolling down, patient attempted to catch cart and tripped. Patient abrasions to face and right hand. Endorses pain on right thumb. Bleeding controlled at this time. No LOC. Patient does endorses ETOH dependency.

## 2022-08-23 NOTE — ED Provider Notes (Signed)
Gary Mclaughlin Provider Note   CSN: [id number] Arrival date & time: 08/23/22  2000     History  Chief Complaint  Patient presents with   Gary Mclaughlin is a 52 y.o. male.  Patient is a 52 year old male who has a history of alcohol use disorder who presents after a fall.  EMS had stated that a bystander saw the patient walking and his cart started rolling and as he attempted to catch the cart, he tripped and fell.  He has some abrasions to his face and his right hand.  When I asked him what happened, he is reluctant to talk initially but states that he got inebriated and because he was inebriated he fell.  He will not tell me if anything hurts.  He has been seen previously in the ED for alcohol intoxication.       Home Medications Prior to Admission medications   Medication Sig Start Date End Date Taking? Authorizing Provider  acetaminophen (TYLENOL 8 HOUR) 650 MG CR tablet Take 1 tablet (650 mg total) by mouth every 8 (eight) hours as needed for pain. 05/13/22   [name], DO  vitamin D3 (CHOLECALCIFEROL) 25 MCG tablet Take 1 tablet (1,000 Units total) by mouth daily. 04/22/22   [name], [name], PA-C  docusate sodium (COLACE) 100 MG capsule Take 1 capsule (100 mg total) by mouth 2 (two) times daily as needed for mild constipation. 04/21/22   [name], [name], PA-C  gabapentin (NEURONTIN) 300 MG capsule Take 1 capsule (300 mg total) by mouth 3 (three) times daily as needed. 04/21/22   [name], [name], PA-C  ibuprofen (ADVIL) 600 MG tablet Take 1 tablet (600 mg total) by mouth every 6 (six) hours as needed. 05/13/22   [name], DO  methocarbamol (ROBAXIN) 500 MG tablet Take 2 tablets (1,000 mg total) by mouth every 8 (eight) hours as needed for muscle spasms. 04/21/22   [name], [name], PA-C  morphine (MSIR) 30 MG tablet Take 1 tablet (30 mg total) by mouth every 4 (four) hours as needed for severe pain.  04/27/22   Mesner, [name], MD  oxyCODONE (OXY IR/ROXICODONE) 5 MG immediate release tablet Take 1-2 tablets (5-10 mg total) by mouth every 6 (six) hours as needed for breakthrough pain. 04/21/22   [name], [name], PA-C  polyethylene glycol (MIRALAX / GLYCOLAX) 17 g packet Take 17 g by mouth daily as needed. 04/21/22   [name], [name], PA-C      Allergies    Patient has no known allergies.    Review of Systems   Review of Systems  Unable to perform ROS: Mental status change    Physical Exam Updated Vital Signs BP 102/73   Pulse 66   Temp (!) 97.4 F (36.3 C) (Oral)   Resp 18   Ht  (1.753 m)   Wt 74.8 kg   SpO2 92%   BMI 24.35 kg/m  Physical Exam Constitutional:      Appearance: He is well-developed.  HENT:     Head: Normocephalic.     Comments: Patient has abrasions to right forehead and right maxilla area.  There are some mild swelling in these areas. Eyes:     Extraocular Movements: Extraocular movements intact.     Pupils: Pupils are equal, round, and reactive to light.  Cardiovascular:     Rate and Rhythm: Normal rate and regular rhythm.     Heart  sounds: Normal heart sounds.  Pulmonary:     Effort: Pulmonary effort is normal. No respiratory distress.     Breath sounds: Normal breath sounds. No wheezing or rales.  Chest:     Chest wall: No tenderness.  Abdominal:     General: Bowel sounds are normal.     Palpations: Abdomen is soft.     Tenderness: There is no abdominal tenderness. There is no guarding or rebound.  Musculoskeletal:        General: Normal range of motion.     Cervical back: Normal range of motion and neck supple.     Comments: There is some swelling to the right hand with some abrasions to the fingers.  No specific area of bony tenderness is noted.  No other pain on palpation or range of motion to the extremities.  Lymphadenopathy:     Cervical: No cervical adenopathy.  Skin:    General: Skin is warm and dry.     Findings: No rash.   Neurological:     General: No focal deficit present.     Mental Status: He is alert.     Comments: Patient is sleepy but will wake up and answer some simple questions.  He will not answer my orientation questions.     ED Results / Procedures / Treatments   Labs (all labs ordered are listed, but only abnormal results are displayed) Labs Reviewed - No data to display  EKG None  Radiology CT Head Wo Contrast  Result Date: 08/23/2022 CLINICAL DATA:  Fall EXAM: CT HEAD WITHOUT CONTRAST CT MAXILLOFACIAL WITHOUT CONTRAST CT CERVICAL SPINE WITHOUT CONTRAST TECHNIQUE: Multidetector CT imaging of the head, cervical spine, and maxillofacial structures were performed using the standard protocol without intravenous contrast. Multiplanar CT image reconstructions of the cervical spine and maxillofacial structures were also generated. RADIATION DOSE REDUCTION: This exam was performed according to the departmental dose-optimization program which includes automated exposure control, adjustment of the mA and/or kV according to patient size and/or use of iterative reconstruction technique. COMPARISON:  None Available. FINDINGS: CT HEAD FINDINGS Brain: There is no mass, hemorrhage or extra-axial collection. The size and configuration of the ventricles and extra-axial CSF spaces are normal. The brain parenchyma is normal, without evidence of acute or chronic infarction. Vascular: No abnormal hyperdensity of the major intracranial arteries or dural venous sinuses. No intracranial atherosclerosis. Skull: The visualized skull base, calvarium and extracranial soft tissues are normal. CT MAXILLOFACIAL FINDINGS Osseous: --Complex facial fracture types: No LeFort, zygomaticomaxillary complex or nasoorbitoethmoidal fracture. --Simple fracture types: None. --Mandible: No fracture or dislocation. Orbits: The globes are intact. Normal appearance of the intra- and extraconal fat. Symmetric extraocular muscles and optic nerves.  Sinuses: No fluid levels or advanced mucosal thickening. Soft tissues: Normal visualized extracranial soft tissues. CT CERVICAL SPINE FINDINGS Alignment: No static subluxation. Facets are aligned. Occipital condyles and the lateral masses of C1-C2 are aligned. Skull base and vertebrae: No acute fracture. Soft tissues and spinal canal: No prevertebral fluid or swelling. No visible canal hematoma. Disc levels: No advanced spinal canal or neural foraminal stenosis. Upper chest: No pneumothorax, pulmonary nodule or pleural effusion. Other: Normal visualized paraspinal cervical soft tissues. IMPRESSION: 1. No acute intracranial abnormality. 2. No facial fracture. 3. No acute fracture or static subluxation of the cervical spine. Electronically Signed   By: Deatra Robinson M.D.   On: 08/23/2022 22:33   CT Cervical Spine Wo Contrast  Result Date: 08/23/2022 CLINICAL DATA:  Fall EXAM: CT HEAD WITHOUT  CONTRAST CT MAXILLOFACIAL WITHOUT CONTRAST CT CERVICAL SPINE WITHOUT CONTRAST TECHNIQUE: Multidetector CT imaging of the head, cervical spine, and maxillofacial structures were performed using the standard protocol without intravenous contrast. Multiplanar CT image reconstructions of the cervical spine and maxillofacial structures were also generated. RADIATION DOSE REDUCTION: This exam was performed according to the departmental dose-optimization program which includes automated exposure control, adjustment of the mA and/or kV according to patient size and/or use of iterative reconstruction technique. COMPARISON:  None Available. FINDINGS: CT HEAD FINDINGS Brain: There is no mass, hemorrhage or extra-axial collection. The size and configuration of the ventricles and extra-axial CSF spaces are normal. The brain parenchyma is normal, without evidence of acute or chronic infarction. Vascular: No abnormal hyperdensity of the major intracranial arteries or dural venous sinuses. No intracranial atherosclerosis. Skull: The  visualized skull base, calvarium and extracranial soft tissues are normal. CT MAXILLOFACIAL FINDINGS Osseous: --Complex facial fracture types: No LeFort, zygomaticomaxillary complex or nasoorbitoethmoidal fracture. --Simple fracture types: None. --Mandible: No fracture or dislocation. Orbits: The globes are intact. Normal appearance of the intra- and extraconal fat. Symmetric extraocular muscles and optic nerves. Sinuses: No fluid levels or advanced mucosal thickening. Soft tissues: Normal visualized extracranial soft tissues. CT CERVICAL SPINE FINDINGS Alignment: No static subluxation. Facets are aligned. Occipital condyles and the lateral masses of C1-C2 are aligned. Skull base and vertebrae: No acute fracture. Soft tissues and spinal canal: No prevertebral fluid or swelling. No visible canal hematoma. Disc levels: No advanced spinal canal or neural foraminal stenosis. Upper chest: No pneumothorax, pulmonary nodule or pleural effusion. Other: Normal visualized paraspinal cervical soft tissues. IMPRESSION: 1. No acute intracranial abnormality. 2. No facial fracture. 3. No acute fracture or static subluxation of the cervical spine. Electronically Signed   By: Deatra Robinson M.D.   On: 08/23/2022 22:33   CT Maxillofacial WO CM  Result Date: 08/23/2022 CLINICAL DATA:  Fall EXAM: CT HEAD WITHOUT CONTRAST CT MAXILLOFACIAL WITHOUT CONTRAST CT CERVICAL SPINE WITHOUT CONTRAST TECHNIQUE: Multidetector CT imaging of the head, cervical spine, and maxillofacial structures were performed using the standard protocol without intravenous contrast. Multiplanar CT image reconstructions of the cervical spine and maxillofacial structures were also generated. RADIATION DOSE REDUCTION: This exam was performed according to the departmental dose-optimization program which includes automated exposure control, adjustment of the mA and/or kV according to patient size and/or use of iterative reconstruction technique. COMPARISON:  None  Available. FINDINGS: CT HEAD FINDINGS Brain: There is no mass, hemorrhage or extra-axial collection. The size and configuration of the ventricles and extra-axial CSF spaces are normal. The brain parenchyma is normal, without evidence of acute or chronic infarction. Vascular: No abnormal hyperdensity of the major intracranial arteries or dural venous sinuses. No intracranial atherosclerosis. Skull: The visualized skull base, calvarium and extracranial soft tissues are normal. CT MAXILLOFACIAL FINDINGS Osseous: --Complex facial fracture types: No LeFort, zygomaticomaxillary complex or nasoorbitoethmoidal fracture. --Simple fracture types: None. --Mandible: No fracture or dislocation. Orbits: The globes are intact. Normal appearance of the intra- and extraconal fat. Symmetric extraocular muscles and optic nerves. Sinuses: No fluid levels or advanced mucosal thickening. Soft tissues: Normal visualized extracranial soft tissues. CT CERVICAL SPINE FINDINGS Alignment: No static subluxation. Facets are aligned. Occipital condyles and the lateral masses of C1-C2 are aligned. Skull base and vertebrae: No acute fracture. Soft tissues and spinal canal: No prevertebral fluid or swelling. No visible canal hematoma. Disc levels: No advanced spinal canal or neural foraminal stenosis. Upper chest: No pneumothorax, pulmonary nodule or pleural effusion. Other: Normal  visualized paraspinal cervical soft tissues. IMPRESSION: 1. No acute intracranial abnormality. 2. No facial fracture. 3. No acute fracture or static subluxation of the cervical spine. Electronically Signed   By: Deatra Robinson M.D.   On: 08/23/2022 22:33   DG Hand Complete Right  Result Date: 08/23/2022 CLINICAL DATA:  Pain after fall EXAM: RIGHT HAND - COMPLETE 3 VIEW COMPARISON:  None Available. FINDINGS: No fracture or dislocation. Preserved joint spaces and bone mineralization. Of note the fingers are flexed on all views limiting evaluation of the phalanges. On the  lateral view there is a questionable radiopaque density along the anterior aspect of the hand at the level of the middle phalanges. With overlap of the phalanges on this lateral view is difficult to ascertain exact location of this area. This could be the third digit. Please correlate with location of lacerations and additional evaluation as clinically directed. IMPRESSION: Limited x-rays for the phalanges.  No acute osseous abnormality. On the lateral view there is a questionable radiopaque density along the anterior aspect of the hand at the level of the middle phalanges. With overlap of the phalanges on this lateral view is difficult to ascertain exact location of this focus. This could be the third digit. Please correlate with location of lacerations and additional evaluation as clinically directed. Electronically Signed   By: Karen Kays M.D.   On: 08/23/2022 21:46    Procedures Procedures    Medications Ordered in ED Medications - No data to display  ED Course/ Medical Decision Making/ A&P                             Medical Decision Making Amount and/or Complexity of Data Reviewed Radiology: ordered.   Patient is a 52 year old who presents with presumed alcohol intoxication and some abrasions to his face and right hand.  He had CT scan of his head, facial bones, cervical spine which show no acute fractures.  X-rays of his right hand were interpreted by me and confirmed by the radiologist to show no evident fractures.  There was a concern for foreign body but on examination of the hand, there is only superficial abrasions.  Patient care turned over to Dr. Nicanor Alcon pending reassessment after patient has time to metabolize his presumed alcohol.  Final Clinical Impression(s) / ED Diagnoses Final diagnoses:  Alcoholic intoxication without complication  Injury of head, initial encounter  Multiple abrasions    Rx / DC Orders ED Discharge Orders     None         Rolan Bucco,  MD 08/23/22 [id number]    Rolan Bucco, MD 08/24/22 1337

## 2022-08-24 NOTE — ED Provider Notes (Signed)
Awake alert no SI.  Patient is ambulating and performing ADLs.  PO challenged successfully.  Stable for DC   Lura Falor, MD 08/24/22 [phone]

## 2022-08-24 NOTE — ED Notes (Signed)
Patient given snack and drink. Awoke to verbal stimuli.

## 2022-08-24 NOTE — ED Notes (Signed)
Patient given discharge instructions and follow up care. Patient verbalized understanding. Patient ambulatory out of ED by self. 

## 2022-08-28 IMAGING — CT CT HEAD W/O CM
4 series · 16 of 47 positions shown, 18 images · non-contrast
Comparison: CT 07/28/2017

CLINICAL DATA: Altered mental status



[Series 3: head without · axial · non-contrast · 0.46mm/px · z∈[-160,-40]mm · 7 of 34 slices shown, 9 images]
[im 5/34  brain]
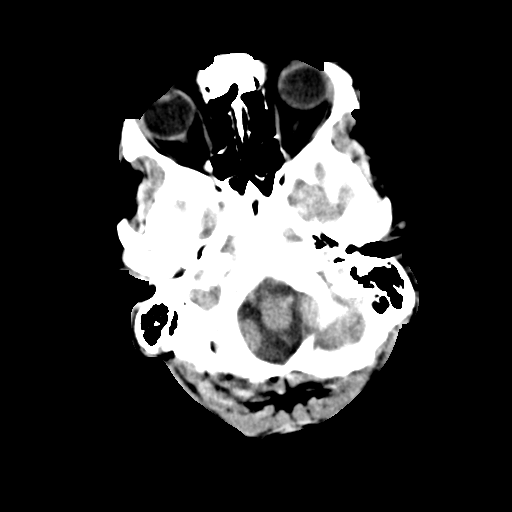
[im 5/34  bone]
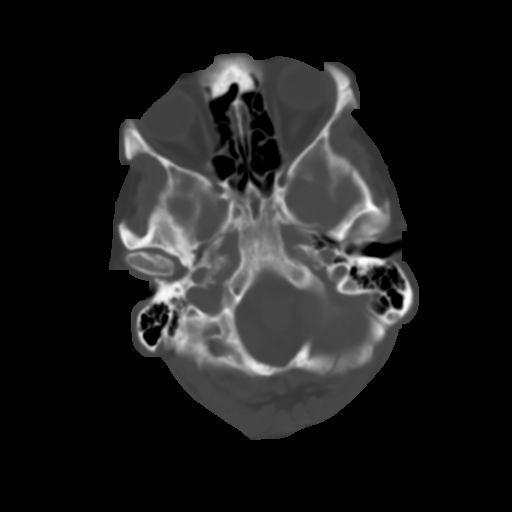
[im 9/34  brain]
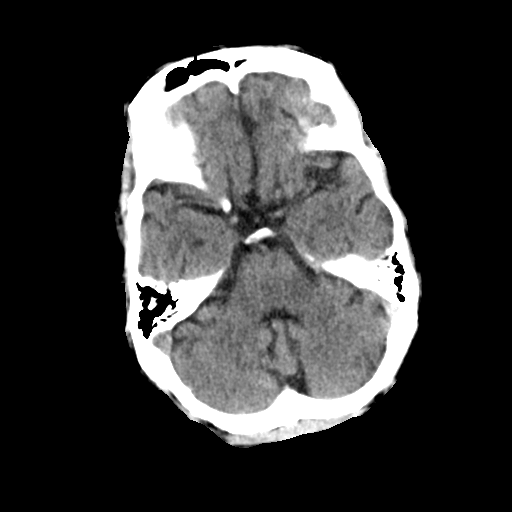
[im 13/34  brain]
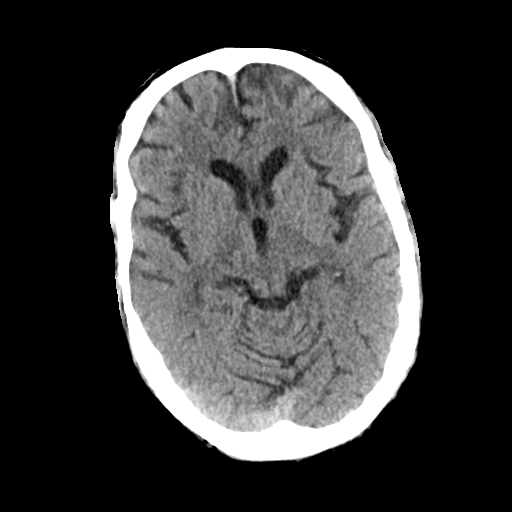
[im 17/34  brain]
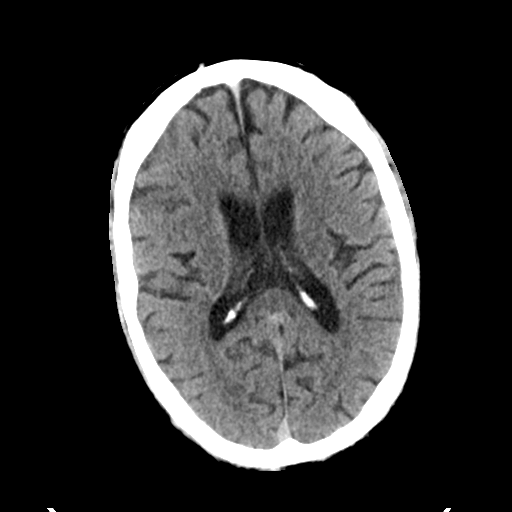
[im 21/34  brain]
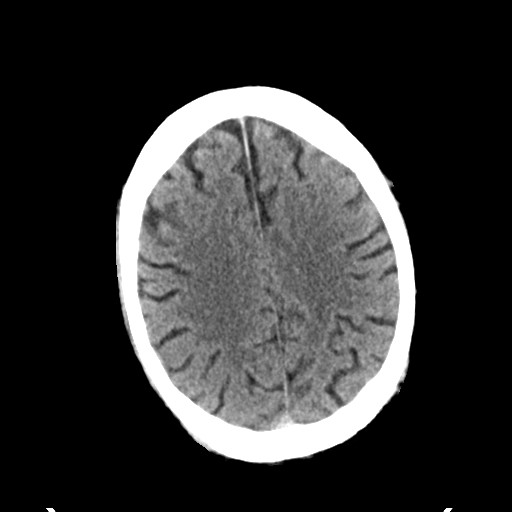
[im 21/34  bone]
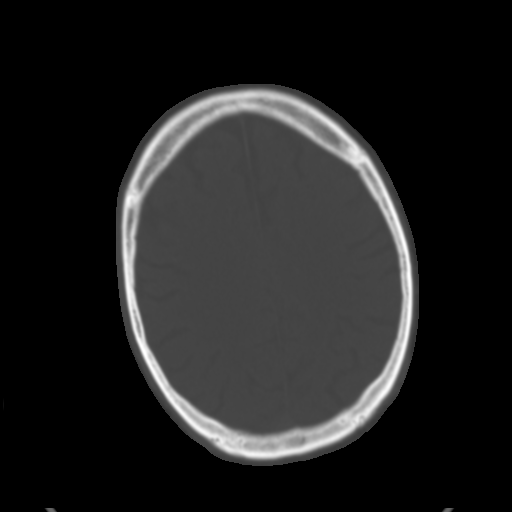
[im 25/34  brain]
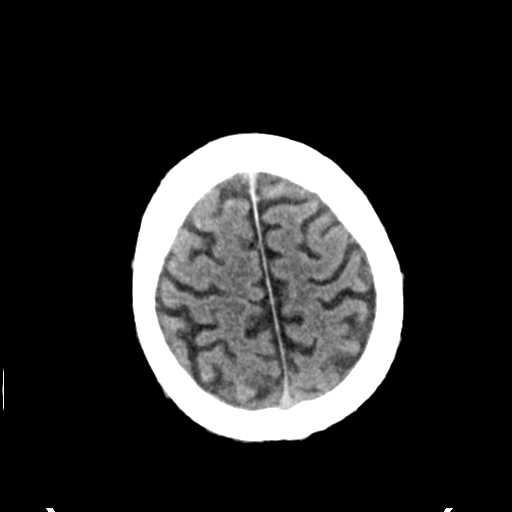
[im 29/34  brain]
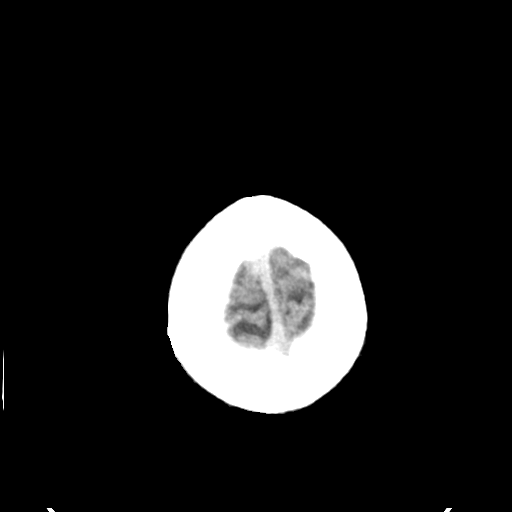

[Series 4: head bone · axial · 0.46mm/px · z∈[-164,-132]mm · 3 of 84 slices shown]
[im 9/84  bone]
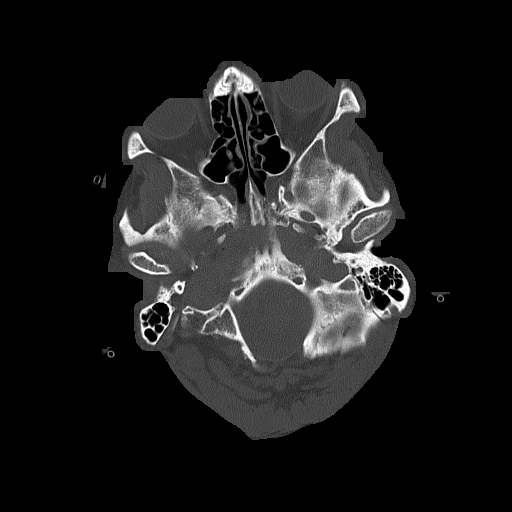
[im 17/84  bone]
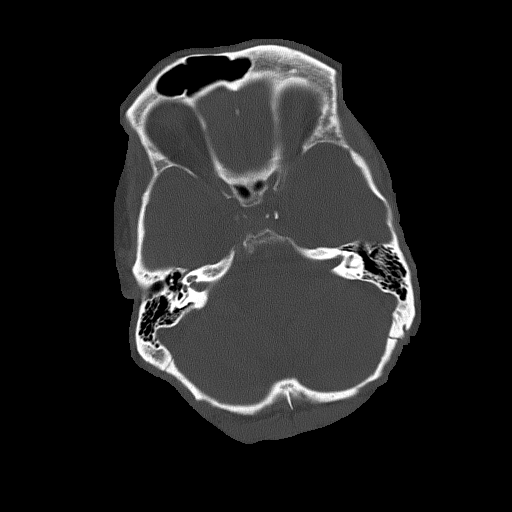
[im 25/84  bone]
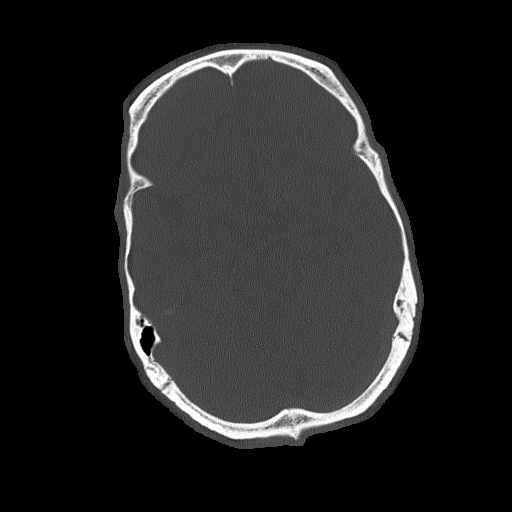

[Series 5: head without cor · coronal · non-contrast · 0.33mm/px · 3 of 77 slices shown]
[im 26/77  brain]
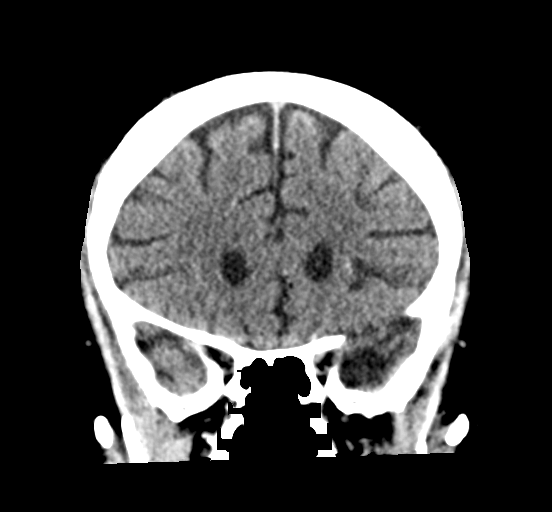
[im 34/77  brain]
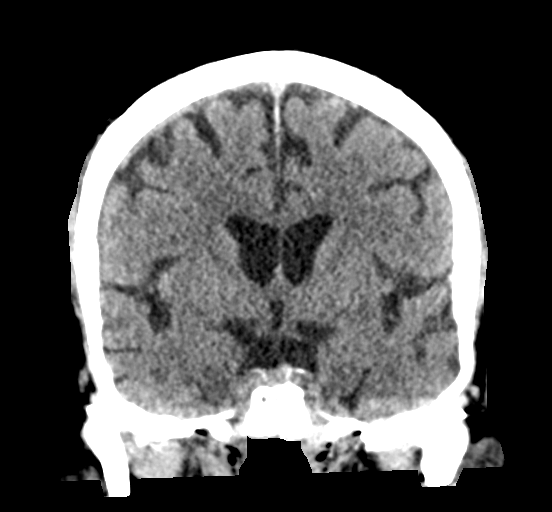
[im 43/77  brain]
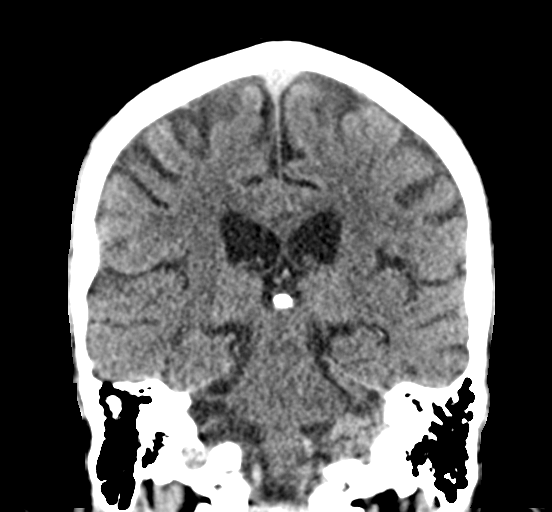

[Series 6: head without sag · sagittal · non-contrast · 0.34mm/px · 3 of 61 slices shown]
[im 21/61  brain]
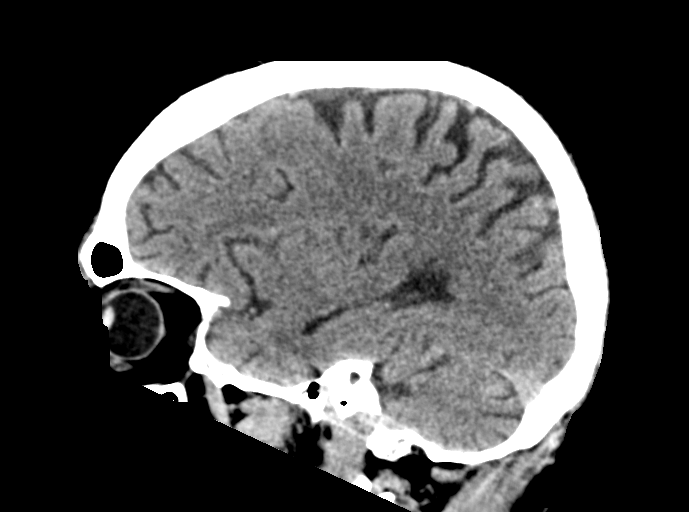
[im 31/61  brain]
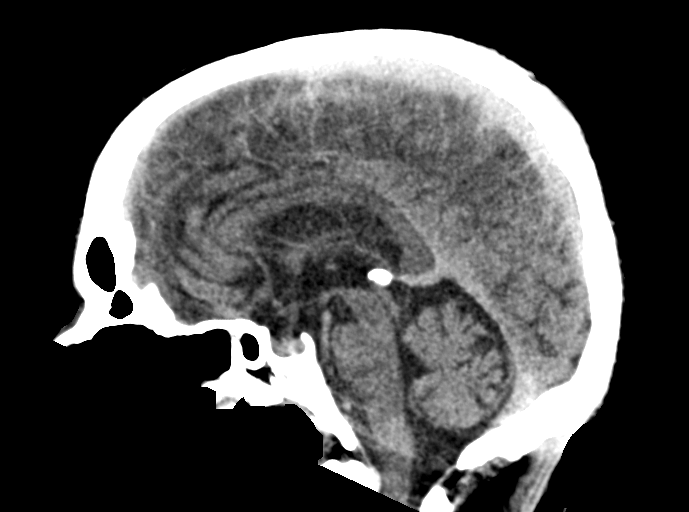
[im 41/61  brain]
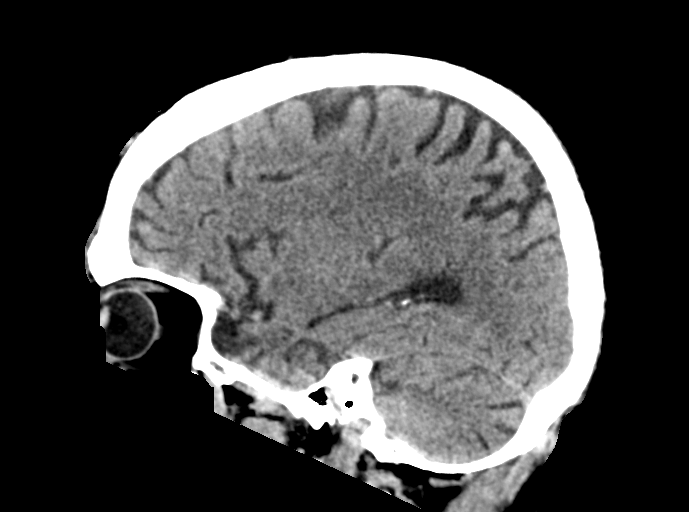

[16 of 47 positions shown; findings below may reference images not displayed]

FINDINGS: Brain: Encephalomalacia at the left frontal lobe and anterior
temporal pole corresponding to history of remote trauma. No acute
territorial infarction. No hemorrhage or intracranial mass. The
ventricles are nonenlarged

Vascular: No hyperdense vessels.  Carotid vascular calcification

Skull: Normal. Negative for fracture or focal lesion.

Sinuses/Orbits: No acute finding. Old appearing fracture deformity
medial wall right orbit

Other: None
IMPRESSION: 1. No CT evidence for acute intracranial abnormality.
2. Encephalomalacia involving the left frontal and anterior temporal
lobes consistent with remote history of trauma

## 2022-08-31 ENCOUNTER — Emergency Department (HOSPITAL_COMMUNITY)
Admission: EM | Admit: 2022-08-31 | Discharge: 2022-08-31 | Discharge status: Against Medical Advice | Payer: Medicaid Other | Attending: Emergency Medicine | Admitting: Emergency Medicine

## 2022-08-31 ENCOUNTER — Emergency Department (HOSPITAL_COMMUNITY): Payer: Medicaid Other

## 2022-08-31 ENCOUNTER — Other Ambulatory Visit: Payer: Self-pay

## 2022-08-31 DIAGNOSIS — Z5900 Homelessness unspecified: Secondary | ICD-10-CM | POA: Diagnosis not present

## 2022-08-31 DIAGNOSIS — R000 Tachycardia, unspecified: Secondary | ICD-10-CM | POA: Insufficient documentation

## 2022-08-31 DIAGNOSIS — R45851 Suicidal ideations: Secondary | ICD-10-CM | POA: Diagnosis not present

## 2022-08-31 DIAGNOSIS — F10920 Alcohol use, unspecified with intoxication, uncomplicated: Secondary | ICD-10-CM

## 2022-08-31 DIAGNOSIS — F17200 Nicotine dependence, unspecified, uncomplicated: Secondary | ICD-10-CM | POA: Diagnosis not present

## 2022-08-31 DIAGNOSIS — F32A Depression, unspecified: Secondary | ICD-10-CM | POA: Diagnosis not present

## 2022-08-31 DIAGNOSIS — R062 Wheezing: Secondary | ICD-10-CM | POA: Diagnosis not present

## 2022-08-31 DIAGNOSIS — F10120 Alcohol abuse with intoxication, uncomplicated: Secondary | ICD-10-CM | POA: Diagnosis not present

## 2022-08-31 DIAGNOSIS — Y908 Blood alcohol level of 240 mg/100 ml or more: Secondary | ICD-10-CM | POA: Insufficient documentation

## 2022-08-31 DIAGNOSIS — F1090 Alcohol use, unspecified, uncomplicated: Secondary | ICD-10-CM | POA: Diagnosis present

## 2022-08-31 LAB — ETHANOL: Alcohol, Ethyl (B): 396 mg/dL (ref <10)

## 2022-08-31 LAB — CBC
HCT: 32.2 % — ABNORMAL LOW (ref 39.0–52.0)
Hemoglobin: 11.3 g/dL — ABNORMAL LOW (ref 13.0–17.0)
MCH: 33.2 pg (ref 26.0–34.0)
MCHC: 35.1 g/dL (ref 30.0–36.0)
MCV: 94.7 fL (ref 80.0–100.0)
Platelets: 295 10*3/uL (ref 150–400)
RBC: 3.4 MIL/uL — ABNORMAL LOW (ref 4.22–5.81)
RDW: 13.5 % (ref 11.5–15.5)
WBC: 7.3 10*3/uL (ref 4.0–10.5)
nRBC: 0 % (ref 0.0–0.2)

## 2022-08-31 LAB — COMPREHENSIVE METABOLIC PANEL
ALT: 49 U/L — ABNORMAL HIGH (ref 0–44)
AST: 78 U/L — ABNORMAL HIGH (ref 15–41)
Albumin: 3.3 g/dL — ABNORMAL LOW (ref 3.5–5.0)
Alkaline Phosphatase: 96 U/L (ref 38–126)
Anion gap: 11 (ref 5–15)
BUN: 9 mg/dL (ref 6–20)
CO2: 24 mmol/L (ref 22–32)
Calcium: 9.1 mg/dL (ref 8.9–10.3)
Chloride: 100 mmol/L (ref 98–111)
Creatinine, Ser: 0.89 mg/dL (ref 0.61–1.24)
GFR, Estimated: >60 mL/min (ref >60)
Glucose, Bld: 134 mg/dL — ABNORMAL HIGH (ref 70–99)
Potassium: 4.2 mmol/L (ref 3.5–5.1)
Sodium: 135 mmol/L (ref 135–145)
Total Bilirubin: 0.9 mg/dL (ref 0.3–1.2)
Total Protein: 6.8 g/dL (ref 6.5–8.1)

## 2022-08-31 MED ORDER — LACTATED RINGERS IV BOLUS: 1000.0000 mL | Freq: Once | INTRAVENOUS | Status: DC

## 2022-08-31 MED ORDER — ALBUTEROL SULFATE HFA 108 (90 BASE) MCG/ACT IN AERS
2.0000 | INHALATION_SPRAY | RESPIRATORY_TRACT | Status: DC | PRN
  Filled 2022-08-31: qty 6.7

## 2022-08-31 NOTE — ED Provider Notes (Signed)
[place] EMERGENCY DEPARTMENT AT [facility] Provider Note   CSN: [id number] Arrival date & time: 08/31/22  1608     History {Add pertinent medical, surgical, social history, OB history to HPI:1} Chief Complaint  Patient presents with   Alcohol Intoxication    Gary Mclaughlin is a 52 y.o. male.  Patient is a 51 year old male with a history of chronic alcohol abuse with prior DTs and requiring intubation in the past, regular tobacco use and depression with suicide attempts in the past who is presenting today after someone saw him lying in the ditch as the patient is unhoused.  When EMS arrived patient woke up but did admit to drinking today and reported he wanted to come to the hospital.  They noted he was mildly hypotensive and tachycardic.  They also gave patient oxygen because his sats were in the low 90s.  Patient currently denies any chest pain or shortness of breath.  He does smoke regularly and in the past has used an inhaler but reports it ran out and he never got another 1.  He is drinking 3 to 4  40 ounce beers per day or more if he can get his hands on it.  His last drink was before they picked him up.  He does not take any other medications.  When asked if he has having any thoughts about wanting to hurt himself he states it would be a good day to die but does not give a specific plan.  He does report wanting help with cessation of alcohol.  The history is provided by the patient and medical records.  Alcohol Intoxication       Home Medications Prior to Admission medications   Medication Sig Start Date End Date Taking? Authorizing Provider  acetaminophen (TYLENOL 8 HOUR) 650 MG CR tablet Take 1 tablet (650 mg total) by mouth every 8 (eight) hours as needed for pain. 05/13/22   [name], DO  vitamin D3 (CHOLECALCIFEROL) 25 MCG tablet Take 1 tablet (1,000 Units total) by mouth daily. 04/22/22   [name], [name], PA-C  docusate sodium (COLACE) 100 MG  capsule Take 1 capsule (100 mg total) by mouth 2 (two) times daily as needed for mild constipation. 04/21/22   [name], [name], PA-C  gabapentin (NEURONTIN) 300 MG capsule Take 1 capsule (300 mg total) by mouth 3 (three) times daily as needed. 04/21/22   [name], [name], PA-C  ibuprofen (ADVIL) 600 MG tablet Take 1 tablet (600 mg total) by mouth every 6 (six) hours as needed. 05/13/22   [name], DO  methocarbamol (ROBAXIN) 500 MG tablet Take 2 tablets (1,000 mg total) by mouth every 8 (eight) hours as needed for muscle spasms. 04/21/22   [name], [name], PA-C  morphine (MSIR) 30 MG tablet Take 1 tablet (30 mg total) by mouth every 4 (four) hours as needed for severe pain. 04/27/22   Mesner, [name], MD  oxyCODONE (OXY IR/ROXICODONE) 5 MG immediate release tablet Take 1-2 tablets (5-10 mg total) by mouth every 6 (six) hours as needed for breakthrough pain. 04/21/22   [name], [name], PA-C  polyethylene glycol (MIRALAX / GLYCOLAX) 17 g packet Take 17 g by mouth daily as needed. 04/21/22   [name], [name], PA-C      Allergies    Patient has no known allergies.    Review of Systems   Review of Systems  Physical Exam Updated Vital Signs BP 98/68   Pulse 80  Temp 98 F (36.7 C) (Oral)   Resp 17   Ht 5\' 8"  (1.727 m)   Wt 74.8 kg   SpO2 96%   BMI 25.09 kg/m  Physical Exam Vitals and nursing note reviewed.  Constitutional:      General: He is not in acute distress.    Appearance: He is well-developed.     Comments: Disheveled  HENT:     Head: Normocephalic and atraumatic.  Eyes:     Conjunctiva/sclera: Conjunctivae normal.     Pupils: Pupils are equal, round, and reactive to light.  Cardiovascular:     Rate and Rhythm: Regular rhythm. Tachycardia present.     Heart sounds: No murmur heard. Pulmonary:     Effort: Pulmonary effort is normal. No respiratory distress.     Breath sounds: Wheezing present. No rales.  Abdominal:     General: There is no distension.      Palpations: Abdomen is soft.     Tenderness: There is no abdominal tenderness. There is no guarding or rebound.  Musculoskeletal:        General: No tenderness. Normal range of motion.     Cervical back: Normal range of motion and neck supple.  Skin:    General: Skin is warm and dry.     Findings: No erythema or rash.  Neurological:     Mental Status: He is alert and oriented to person, place, and time.     Sensory: No sensory deficit.     Motor: No weakness.     Comments: Currently mildly intoxicated but cooperative and able to give a full history  Psychiatric:        Mood and Affect: Mood is depressed.        Behavior: Behavior normal.        Thought Content: Thought content includes suicidal ideation. Thought content does not include suicidal plan.     ED Results / Procedures / Treatments   Labs (all labs ordered are listed, but only abnormal results are displayed) Labs Reviewed  COMPREHENSIVE METABOLIC PANEL - Abnormal; Notable for the following components:      Result Value   Glucose, Bld 134 (*)    Albumin 3.3 (*)    AST 78 (*)    ALT 49 (*)    All other components within normal limits  ETHANOL - Abnormal; Notable for the following components:   Alcohol, Ethyl (B) 396 (*)    All other components within normal limits  CBC - Abnormal; Notable for the following components:   RBC 3.40 (*)    Hemoglobin 11.3 (*)    HCT 32.2 (*)    All other components within normal limits  RAPID URINE DRUG SCREEN, HOSP PERFORMED    EKG None  Radiology No results found.  Procedures Procedures  {Document cardiac monitor, telemetry assessment procedure when appropriate:1}  Medications Ordered in ED Medications  lactated ringers bolus 1,000 mL (has no administration in time range)  albuterol (VENTOLIN HFA) 108 (90 Base) MCG/ACT inhaler 2 puff (has no administration in time range)    ED Course/ Medical Decision Making/ A&P   {   Click here for ABCD2, HEART and other  calculatorsREFRESH Note before signing :1}                          Medical Decision Making Amount and/or Complexity of Data Reviewed Labs: ordered. Radiology: ordered.  Risk Prescription drug management.   Pt with multiple  medical problems and comorbidities and presenting today with a complaint that caries a high risk for morbidity and mortality.  Patient here today after being found along the side of the road and bystanders called EMS.  Patient is intoxicated here but awake and alert and able to answer questions.  He is mildly hypotensive at 98/68 but reports he has not eaten or had anything other than alcohol today.  He is wheezing which is most likely baseline from chronic tobacco use but sats are greater than 90% on room air.  Patient denies any shortness of breath or cough.  He was given albuterol inhaler and some IV fluids and is requesting something to eat and drink.  Also patient does have a history of depression and prior suicidality.  He does not give a definitive plan but does report that it would be a good day to die and is wanting to speak with mental health. I independently interpreted patient's labs and CMP today with mild elevated LFTs most likely from alcohol use but normal creatinine and electrolytes, alcohol today of 396 and CBC without acute findings.  Will ensure patient's vital signs improved with fluids and then will consult TTS.   {Document critical care time when appropriate:1} {Document review of labs and clinical decision tools ie heart score, Chads2Vasc2 etc:1}  {Document your independent review of radiology images, and any outside records:1} {Document your discussion with family members, caretakers, and with consultants:1} {Document social determinants of health affecting pt's care:1} {Document your decision making why or why not admission, treatments were needed:1} Final Clinical Impression(s) / ED Diagnoses Final diagnoses:  None    Rx / DC Orders ED  Discharge Orders     None

## 2022-08-31 NOTE — ED Triage Notes (Addendum)
Pt via [facility] after bystander called 911 to report pt lying on side of road. Pt wants to detox from ETOH, most recent drink earlier today and +hx DT seizures. 19ga L AC, 500cc NS en route. CBG 126  BP 90/62 HR 100 NSR O2 86-89 while smoking and walking, increased to 96% on 2L en route  Pt is a/o but seems intoxicated. No aggressive behavior noted. Pt states he has at least four 40-oz beers daily. Today he has had two 40s and a fifth of vodka. Numerous prior attempts to detox. Denies SI/HI at this time.

## 2022-08-31 NOTE — ED Notes (Signed)
Security viewed footage showing that pt left to smoke a cigarette, was unable to get back in through rear door, and then removed his own IV and EKG electrodes before leaving on foot. Elopement noted, provider informed.

## 2022-08-31 NOTE — ED Notes (Signed)
Date and time results received: 08/31/22 5:14 PM  Test: Ethanol Critical Value: 396   Name of Provider Notified: Gwyneth Sprout  Awaiting orders

## 2022-08-31 NOTE — ED Notes (Signed)
RN entered pt's room to administer medications and found that pt has eloped. Coordinated with security and upon review of security footage, pt was seen exiting the room via rear hallways at about 1734 pm, leaving through the rear of yellow zone. Pt's IV is intact, placed by EMS PTA. Security is currently looking for the pt.

## 2022-09-14 ENCOUNTER — Emergency Department (HOSPITAL_COMMUNITY): Payer: Medicaid Other

## 2022-09-14 ENCOUNTER — Inpatient Hospital Stay (HOSPITAL_COMMUNITY)
Admission: EM | Admit: 2022-09-14 | Discharge: 2022-09-17 | DRG: 194 | Disposition: A | Discharge status: Home / Self Care | Payer: Medicaid Other | Attending: Infectious Diseases | Admitting: Infectious Diseases

## 2022-09-14 ENCOUNTER — Encounter (HOSPITAL_COMMUNITY): Payer: Self-pay | Admitting: Pharmacy Technician

## 2022-09-14 ENCOUNTER — Other Ambulatory Visit: Payer: Self-pay

## 2022-09-14 DIAGNOSIS — Z1152 Encounter for screening for COVID-19: Secondary | ICD-10-CM | POA: Diagnosis not present

## 2022-09-14 DIAGNOSIS — D539 Nutritional anemia, unspecified: Secondary | ICD-10-CM | POA: Diagnosis present

## 2022-09-14 DIAGNOSIS — J440 Chronic obstructive pulmonary disease with acute lower respiratory infection: Secondary | ICD-10-CM | POA: Diagnosis present

## 2022-09-14 DIAGNOSIS — J189 Pneumonia, unspecified organism: Secondary | ICD-10-CM | POA: Diagnosis present

## 2022-09-14 DIAGNOSIS — G40909 Epilepsy, unspecified, not intractable, without status epilepticus: Secondary | ICD-10-CM | POA: Diagnosis present

## 2022-09-14 DIAGNOSIS — Z5900 Homelessness unspecified: Secondary | ICD-10-CM | POA: Diagnosis not present

## 2022-09-14 DIAGNOSIS — F32A Depression, unspecified: Secondary | ICD-10-CM | POA: Diagnosis present

## 2022-09-14 DIAGNOSIS — J441 Chronic obstructive pulmonary disease with (acute) exacerbation: Secondary | ICD-10-CM | POA: Diagnosis present

## 2022-09-14 DIAGNOSIS — Z79891 Long term (current) use of opiate analgesic: Secondary | ICD-10-CM | POA: Diagnosis not present

## 2022-09-14 DIAGNOSIS — F1010 Alcohol abuse, uncomplicated: Secondary | ICD-10-CM | POA: Diagnosis present

## 2022-09-14 DIAGNOSIS — Z716 Tobacco abuse counseling: Secondary | ICD-10-CM

## 2022-09-14 DIAGNOSIS — I2489 Other forms of acute ischemic heart disease: Secondary | ICD-10-CM | POA: Diagnosis present

## 2022-09-14 DIAGNOSIS — F10239 Alcohol dependence with withdrawal, unspecified: Secondary | ICD-10-CM | POA: Diagnosis not present

## 2022-09-14 DIAGNOSIS — R7989 Other specified abnormal findings of blood chemistry: Secondary | ICD-10-CM

## 2022-09-14 DIAGNOSIS — E871 Hypo-osmolality and hyponatremia: Secondary | ICD-10-CM

## 2022-09-14 DIAGNOSIS — F17210 Nicotine dependence, cigarettes, uncomplicated: Secondary | ICD-10-CM | POA: Diagnosis present

## 2022-09-14 DIAGNOSIS — Z87891 Personal history of nicotine dependence: Secondary | ICD-10-CM | POA: Diagnosis not present

## 2022-09-14 DIAGNOSIS — K760 Fatty (change of) liver, not elsewhere classified: Secondary | ICD-10-CM | POA: Diagnosis present

## 2022-09-14 DIAGNOSIS — Z79899 Other long term (current) drug therapy: Secondary | ICD-10-CM | POA: Diagnosis not present

## 2022-09-14 LAB — BASIC METABOLIC PANEL
Anion gap: 16 — ABNORMAL HIGH (ref 5–15)
BUN: 9 mg/dL (ref 6–20)
CO2: 22 mmol/L (ref 22–32)
Calcium: 9.7 mg/dL (ref 8.9–10.3)
Chloride: 89 mmol/L — ABNORMAL LOW (ref 98–111)
Creatinine, Ser: 0.84 mg/dL (ref 0.61–1.24)
GFR, Estimated: >60 mL/min (ref >60)
Glucose, Bld: 94 mg/dL (ref 70–99)
Potassium: 4 mmol/L (ref 3.5–5.1)
Sodium: 127 mmol/L — ABNORMAL LOW (ref 135–145)

## 2022-09-14 LAB — RAPID URINE DRUG SCREEN, HOSP PERFORMED
Amphetamines: NOT DETECTED
Barbiturates: NOT DETECTED
Benzodiazepines: NOT DETECTED
Cocaine: NOT DETECTED
Opiates: NOT DETECTED
Tetrahydrocannabinol: POSITIVE — AB

## 2022-09-14 LAB — D-DIMER, QUANTITATIVE: D-Dimer, Quant: 2.46 ug/mL-FEU — ABNORMAL HIGH (ref 0.00–0.50)

## 2022-09-14 LAB — CBC
HCT: 34.7 % — ABNORMAL LOW (ref 39.0–52.0)
Hemoglobin: 12.1 g/dL — ABNORMAL LOW (ref 13.0–17.0)
MCH: 33.2 pg (ref 26.0–34.0)
MCHC: 34.9 g/dL (ref 30.0–36.0)
MCV: 95.1 fL (ref 80.0–100.0)
Platelets: 300 10*3/uL (ref 150–400)
RBC: 3.65 MIL/uL — ABNORMAL LOW (ref 4.22–5.81)
RDW: 12.9 % (ref 11.5–15.5)
WBC: 11.9 10*3/uL — ABNORMAL HIGH (ref 4.0–10.5)
nRBC: 0 % (ref 0.0–0.2)

## 2022-09-14 LAB — ETHANOL: Alcohol, Ethyl (B): <10 mg/dL (ref <10)

## 2022-09-14 LAB — TROPONIN I (HIGH SENSITIVITY)
Troponin I (High Sensitivity): 118 ng/L (ref <18)
Troponin I (High Sensitivity): 17 ng/L (ref <18)

## 2022-09-14 MED ORDER — NICOTINE 14 MG/24HR TD PT24: 14.0000 mg | MEDICATED_PATCH | Freq: Every day | TRANSDERMAL | Status: DC

## 2022-09-14 MED ORDER — SODIUM CHLORIDE 0.9 % IV SOLN
1.0000 g | Freq: Once | INTRAVENOUS | Status: AC
  Administered 2022-09-14: 1 g via INTRAVENOUS
  Filled 2022-09-14: qty 10

## 2022-09-14 MED ORDER — ACETAMINOPHEN 325 MG PO TABS
650.0000 mg | ORAL_TABLET | Freq: Four times a day (QID) | ORAL | Status: DC | PRN
  Administered 2022-09-15 – 2022-09-16 (×3): 650 mg via ORAL
  Filled 2022-09-14 (×3): qty 2

## 2022-09-14 MED ORDER — LACTATED RINGERS IV BOLUS
1000.0000 mL | Freq: Once | INTRAVENOUS | Status: AC
  Administered 2022-09-14: 1000 mL via INTRAVENOUS

## 2022-09-14 MED ORDER — LORAZEPAM 2 MG/ML IJ SOLN
0.0000 mg | Freq: Four times a day (QID) | INTRAMUSCULAR | Status: DC
  Administered 2022-09-14 – 2022-09-15 (×4): 1 mg via INTRAVENOUS
  Administered 2022-09-16: 2 mg via INTRAVENOUS
  Filled 2022-09-14 (×5): qty 1

## 2022-09-14 MED ORDER — IPRATROPIUM-ALBUTEROL 0.5-2.5 (3) MG/3ML IN SOLN
3.0000 mL | RESPIRATORY_TRACT | Status: DC
  Administered 2022-09-14 – 2022-09-16 (×8): 3 mL via RESPIRATORY_TRACT
  Filled 2022-09-14 (×8): qty 3

## 2022-09-14 MED ORDER — LORAZEPAM 1 MG PO TABS
0.0000 mg | ORAL_TABLET | Freq: Four times a day (QID) | ORAL | Status: DC
  Administered 2022-09-15: 1 mg via ORAL
  Filled 2022-09-14: qty 1

## 2022-09-14 MED ORDER — AMOXICILLIN-POT CLAVULANATE 875-125 MG PO TABS
1.0000 | ORAL_TABLET | Freq: Once | ORAL | Status: DC
  Filled 2022-09-14: qty 1

## 2022-09-14 MED ORDER — ACETAMINOPHEN 650 MG RE SUPP: 650.0000 mg | Freq: Four times a day (QID) | RECTAL | Status: DC | PRN

## 2022-09-14 MED ORDER — THIAMINE MONONITRATE 100 MG PO TABS
100.0000 mg | ORAL_TABLET | Freq: Every day | ORAL | Status: DC
  Administered 2022-09-15 – 2022-09-17 (×3): 100 mg via ORAL
  Filled 2022-09-14 (×4): qty 1

## 2022-09-14 MED ORDER — FOLIC ACID 1 MG PO TABS
1.0000 mg | ORAL_TABLET | Freq: Every day | ORAL | Status: DC
  Administered 2022-09-15 – 2022-09-17 (×3): 1 mg via ORAL
  Filled 2022-09-14 (×3): qty 1

## 2022-09-14 MED ORDER — ADULT MULTIVITAMIN W/MINERALS CH
1.0000 | ORAL_TABLET | Freq: Every day | ORAL | Status: DC
  Administered 2022-09-15 – 2022-09-17 (×3): 1 via ORAL
  Filled 2022-09-14 (×3): qty 1

## 2022-09-14 MED ORDER — RIVAROXABAN 10 MG PO TABS
10.0000 mg | ORAL_TABLET | Freq: Every day | ORAL | Status: DC
  Administered 2022-09-15 – 2022-09-17 (×3): 10 mg via ORAL
  Filled 2022-09-14 (×3): qty 1

## 2022-09-14 MED ORDER — THIAMINE HCL 100 MG/ML IJ SOLN
100.0000 mg | Freq: Every day | INTRAMUSCULAR | Status: DC
  Administered 2022-09-14: 100 mg via INTRAVENOUS
  Filled 2022-09-14: qty 2

## 2022-09-14 MED ORDER — SODIUM CHLORIDE 0.9 % IV SOLN
500.0000 mg | Freq: Once | INTRAVENOUS | Status: AC
  Administered 2022-09-14: 500 mg via INTRAVENOUS
  Filled 2022-09-14: qty 5

## 2022-09-14 MED ORDER — LORAZEPAM 2 MG/ML IJ SOLN: 0.0000 mg | Freq: Two times a day (BID) | INTRAMUSCULAR | Status: DC

## 2022-09-14 MED ORDER — LORAZEPAM 1 MG PO TABS: 0.0000 mg | ORAL_TABLET | Freq: Two times a day (BID) | ORAL | Status: DC

## 2022-09-14 MED ORDER — IOHEXOL 350 MG/ML SOLN
75.0000 mL | Freq: Once | INTRAVENOUS | Status: AC | PRN
  Administered 2022-09-14: 75 mL via INTRAVENOUS

## 2022-09-14 MED ORDER — AZITHROMYCIN 500 MG PO TABS
500.0000 mg | ORAL_TABLET | Freq: Every day | ORAL | Status: AC
  Administered 2022-09-15 – 2022-09-16 (×2): 500 mg via ORAL
  Filled 2022-09-14 (×2): qty 1

## 2022-09-14 NOTE — ED Triage Notes (Addendum)
Pt here with reports of cough since Monday. Pt also complains of ribcage pain and some shob. Pt also wanting to detox from etoh. States drinks 120 ounces of beer daily. Last drink yesterday.

## 2022-09-14 NOTE — ED Provider Notes (Signed)
[place] EMERGENCY DEPARTMENT AT [facility] Provider Note   CSN: [id number] Arrival date & time: 09/14/22  1431     History {Add pertinent medical, surgical, social history, OB history to HPI:1} Chief Complaint  Patient presents with   Shortness of Breath    Hilmer Damas is a 52 y.o. male.  HPI Adult male presents with cough, congestion, productive sputum since 4 days ago.  Patient notes being a drinker, smoker, last alcohol was yesterday.  Since onset symptoms have been persistent, with associated left lower axillary pain. No relief with anything.    Home Medications Prior to Admission medications   Medication Sig Start Date End Date Taking? Authorizing Provider  acetaminophen (TYLENOL 8 HOUR) 650 MG CR tablet Take 1 tablet (650 mg total) by mouth every 8 (eight) hours as needed for pain. 05/13/22   [name], DO  vitamin D3 (CHOLECALCIFEROL) 25 MCG tablet Take 1 tablet (1,000 Units total) by mouth daily. 04/22/22   [name], [name], PA-C  docusate sodium (COLACE) 100 MG capsule Take 1 capsule (100 mg total) by mouth 2 (two) times daily as needed for mild constipation. 04/21/22   [name], [name], PA-C  gabapentin (NEURONTIN) 300 MG capsule Take 1 capsule (300 mg total) by mouth 3 (three) times daily as needed. 04/21/22   [name], [name], PA-C  ibuprofen (ADVIL) 600 MG tablet Take 1 tablet (600 mg total) by mouth every 6 (six) hours as needed. 05/13/22   [name], DO  methocarbamol (ROBAXIN) 500 MG tablet Take 2 tablets (1,000 mg total) by mouth every 8 (eight) hours as needed for muscle spasms. 04/21/22   [name], [name], PA-C  morphine (MSIR) 30 MG tablet Take 1 tablet (30 mg total) by mouth every 4 (four) hours as needed for severe pain. 04/27/22   Mesner, [name], MD  oxyCODONE (OXY IR/ROXICODONE) 5 MG immediate release tablet Take 1-2 tablets (5-10 mg total) by mouth every 6 (six) hours as needed for breakthrough pain. 04/21/22   [name],  [name], PA-C  polyethylene glycol (MIRALAX / GLYCOLAX) 17 g packet Take 17 g by mouth daily as needed. 04/21/22   [name], [name], PA-C      Allergies    Patient has no known allergies.    Review of Systems   Review of Systems  All other systems reviewed and are negative.   Physical Exam Updated Vital Signs BP (!) 141/90   Pulse 92   Temp 98.6 F (37 C) (Oral)   Resp (!) 23   Ht 5\' 8"  (1.727 m)   Wt 74.8 kg   SpO2 98%   BMI 25.07 kg/m  Physical Exam Vitals and nursing note reviewed.  Constitutional:      General: He is not in acute distress.    Comments: Disheveled adult male awake and alert sitting upright  HENT:     Head: Normocephalic and atraumatic.  Eyes:     Conjunctiva/sclera: Conjunctivae normal.  Cardiovascular:     Rate and Rhythm: Regular rhythm. Tachycardia present.  Pulmonary:     Effort: Pulmonary effort is normal. Tachypnea present.  Abdominal:     General: There is no distension.  Skin:    General: Skin is warm and dry.  Neurological:     Mental Status: He is alert and oriented to person, place, and time.     ED Results / Procedures / Treatments   Labs (all labs ordered are listed, but only abnormal results are displayed)  Labs Reviewed  BASIC METABOLIC PANEL - Abnormal; Notable for the following components:      Result Value   Sodium 127 (*)    Chloride 89 (*)    Anion gap 16 (*)    All other components within normal limits  CBC - Abnormal; Notable for the following components:   WBC 11.9 (*)    RBC 3.65 (*)    Hemoglobin 12.1 (*)    HCT 34.7 (*)    All other components within normal limits  RAPID URINE DRUG SCREEN, HOSP PERFORMED - Abnormal; Notable for the following components:   Tetrahydrocannabinol POSITIVE (*)    All other components within normal limits  D-DIMER, QUANTITATIVE - Abnormal; Notable for the following components:   D-Dimer, Quant 2.46 (*)    All other components within normal limits  TROPONIN I (HIGH  SENSITIVITY) - Abnormal; Notable for the following components:   Troponin I (High Sensitivity) 118 (*)    All other components within normal limits  ETHANOL  TROPONIN I (HIGH SENSITIVITY)    EKG EKG Interpretation  Date/Time:  Sunday Sep 14 2022 14:55:41 EDT Ventricular Rate:  110 PR Interval:  140 QRS Duration: 86 QT Interval:  340 QTC Calculation: 460 R Axis:   89 Text Interpretation: Sinus tachycardia Otherwise normal ECG Confirmed by Gerhard Munch [phone]) on 09/14/2022 3:51:57 PM  Radiology CT Angio Chest PE W and/or Wo Contrast  Result Date: 09/14/2022 CLINICAL DATA:  Shortness of Breath 75 ml omni 350^38mL OMNIPAQUE IOHEXOL 350 MG/ML SOLNPulmonary embolism (PE) suspected, low to intermediate prob, positive D-dimer EXAM: CT ANGIOGRAPHY CHEST WITH CONTRAST TECHNIQUE: Multidetector CT imaging of the chest was performed using the standard protocol during bolus administration of intravenous contrast. Multiplanar CT image reconstructions and MIPs were obtained to evaluate the vascular anatomy. RADIATION DOSE REDUCTION: This exam was performed according to the departmental dose-optimization program which includes automated exposure control, adjustment of the mA and/or kV according to patient size and/or use of iterative reconstruction technique. CONTRAST:  75mL OMNIPAQUE IOHEXOL 350 MG/ML SOLN COMPARISON:  CT chest 04/16/2022 FINDINGS: Cardiovascular: No filling defects within the pulmonary arteries to suggest acute pulmonary embolism. No acute findings the thoracic aorta branches. Mediastinum/Nodes: No axillary or supraclavicular adenopathy. No mediastinal or hilar adenopathy. No pericardial fluid. Esophagus normal. Lungs/Pleura: Fine nodular pattern in the RIGHT upper lobe in a airways pattern. Similar findings in the posterior RIGHT lower lobe to lesser degree. Similar findings in the LEFT upper lobe. Upper Abdomen: Low-attenuation liver consistent hepatic steatosis Musculoskeletal: No  fracture or dislocation. Review of the MIP images confirms the above findings. IMPRESSION: 1. No evidence acute pulmonary embolism. 2. Fine nodular pattern in the RIGHT upper lobe and to a lesser degree the RIGHT lower lobe and LEFT upper lobe. Differential includes atypical infection, bronchiolitis, or hypersensitivity pneumonitis. 3. Hepatic steatosis. Electronically Signed   By: Genevive Bi M.D.   On: 09/14/2022 20:21   DG Chest 2 View  Result Date: 09/14/2022 CLINICAL DATA:  Cough.  Chest pain. EXAM: CHEST - 2 VIEW COMPARISON:  Multiple chest x-rays since April 16, 2022. CT of the chest April 16, 2022. FINDINGS: The heart, hila, and mediastinum are normal. No pneumothorax. Mild opacity in the lateral left lung base. Haziness over the periphery of the right lung base. No other acute abnormalities. IMPRESSION: 1. Mild opacity in the lateral left lung base could represent atelectasis or early infiltrate. Recommend short-term follow-up to ensure resolution. 2. Haziness over the periphery of the right lung base  may represent subtle infiltrate versus overlapping soft tissues. Recommend attention on follow-up. Electronically Signed   By: Gerome Sam III M.D.   On: 09/14/2022 15:27    Procedures Procedures  {Document cardiac monitor, telemetry assessment procedure when appropriate:1}  Medications Ordered in ED Medications  LORazepam (ATIVAN) injection 0-4 mg (1 mg Intravenous Given 09/14/22 1853)    Or  LORazepam (ATIVAN) tablet 0-4 mg ( Oral See Alternative 09/14/22 1853)  LORazepam (ATIVAN) injection 0-4 mg (has no administration in time range)    Or  LORazepam (ATIVAN) tablet 0-4 mg (has no administration in time range)  thiamine (VITAMIN B1) tablet 100 mg ( Oral See Alternative 09/14/22 1853)    Or  thiamine (VITAMIN B1) injection 100 mg (100 mg Intravenous Given 09/14/22 1853)  azithromycin (ZITHROMAX) 500 mg in sodium chloride 0.9 % 250 mL IVPB (0 mg Intravenous Stopped 09/14/22  1719)  lactated ringers bolus 1,000 mL (0 mLs Intravenous Stopped 09/14/22 1719)  cefTRIAXone (ROCEPHIN) 1 g in sodium chloride 0.9 % 100 mL IVPB (0 g Intravenous Stopped 09/14/22 1832)  iohexol (OMNIPAQUE) 350 MG/ML injection 75 mL (75 mLs Intravenous Contrast Given 09/14/22 1957)    ED Course/ Medical Decision Making/ A&P   {   Click here for ABCD2, HEART and other calculatorsREFRESH Note before signing :1}                          Medical Decision Making Adult male with history of alcohol abuse, cigarette use presents with left lower axillary pain, cough, congestion for 4 days.  Patient is awake and alert, afebrile, but is tachycardic, minimally tachypneic.  Concern for pneumonia, less likely ACS versus PE given the absence of hypoxia.  Patient had x-ray, labs, and after initial x-ray was reviewed, concern for left-sided opacification he was started on azithromycin, Augmentin.   Amount and/or Complexity of Data Reviewed External Data Reviewed: notes.    Details: 8 prior ED visits in the past 6 months Labs: ordered. Decision-making details documented in ED Course. Radiology: ordered and independent interpretation performed. Decision-making details documented in ED Course. ECG/medicine tests: ordered and independent interpretation performed. Decision-making details documented in ED Course.  Risk OTC drugs. Prescription drug management. Decision regarding hospitalization.  Pulse ox 96% 2 L nasal cannula abnormal Cardiac 95, 105, sinus tach abnormal  Update: Dimer positive.  CT ordered.  Update: Patient with some tremulousness, started on CIWA protocol  8:37 PM Patient CT angiography reviewed, no evidence for pulmonary embolism but there is evidence for right-sided pneumonia, consistent with prior x-ray. Patient second troponin value has diminished, but he continues to have increased work of breathing and tachypnea, and is on 2 L via nasal cannula, though his oxygen saturation on room  air is unknown. Patient has had no seizure activity, he is receiving Ativan per ciwa.  {Document critical care time when appropriate:1} {Document review of labs and clinical decision tools ie heart score, Chads2Vasc2 etc:1}  {Document your independent review of radiology images, and any outside records:1} {Document your discussion with family members, caretakers, and with consultants:1} {Document social determinants of health affecting pt's care:1} {Document your decision making why or why not admission, treatments were needed:1} Final Clinical Impression(s) / ED Diagnoses Final diagnoses:  None    Rx / DC Orders ED Discharge Orders     None

## 2022-09-14 NOTE — Hospital Course (Signed)
Starting Monday had productive cough that was constant, green phlegm--transitioned to green--then to white. Has a substance use disorder, is an alcoholic. Says he wants to get a sponsor and start redoing step work.  Has diarrhea, rib pain from coughing, increased anxiety No fever, hemoptysis Last drink was yesterday--started at 5P, drank into the night 120 oz alcohol. Beer is beverage of choice. Has hx of withdrawal--hot/cold sweats, fevers. Hx withdrawal seizures, "light stroke.".  Once in a while he has food/drink down wrong pipe. Takes seizure medicine when he tries to detox but when he drinks he doesn't worry about it. Hasn't taken keppra since last time he was here. Has been getting shob more often lately. No chest pain  PMH EtOH abuse Hx withdrawal seizures SAH Tobacco abuse  PSH Hip  MEDS No medications. Used to have an albuterol inhaler but does not have this any longer.  ALLERGIES   SOCHX Smokes cigarettes, 1 PPD, since age 70. Is unhomed, has a shed he stays in. No illicit substance use.  FAMHX F: Alcoholic, deceased M: Doesn't speak to her. No other known family history.  -nicotine patch  5/13: cough is still bad, anxiety is still up. No fevers or chills. No tremors or shakiness currently. Not much family or emotional support currently. He wants to get back in touch with sponsor and other support resources in [place].   5/14: Feels better today. Eating breakfast. Still feeling anxious, having tremors. Feels like he is craving a beer. He saw the leg on the walker moving around this morning. Breathing is much better today and cough is improved but he still has some pain.

## 2022-09-14 NOTE — ED Notes (Signed)
Pt placed on 2L [place] for comfort.

## 2022-09-14 NOTE — H&P (Addendum)
Date: 09/14/2022               Patient Name:  Gary Mclaughlin MRN: [id number]  DOB: 23-Apr-1971 Age / Sex: 52 y.o., male   PCP: Pcp, No         Medical Service: Internal Medicine Teaching Service         Attending Physician: Dr. Ginnie Smart, MD    First Contact: Gary Cluster, MD Pager: TR [phone]  Second Contact: Gary Christians, DO Pager: Milinda Pointer [phone]       After Hours (After 5p/  First Contact Pager: [phone]  weekends / holidays): Second Contact Pager: [phone]   SUBJECTIVE   Chief Complaint: cough  History of Present Illness: 52 yo male with PMHx that includes alcohol dependence w/ hx of DTs, hepatic steatosis, subarachnoid hemorrhage, and COPD who presented with shortness of breath.  Patient developed a productive cough last Monday with sputum that has varied in coloration from green to Raylea Adcox.  Denies anything in his sputum that looks like blood.  Denies any obvious episodes consistent with aspiration event.  Also endorses left lateral rib pain that he feels is from coughing.  Denies fever or chills.  Endorses chronic shortness of breath but no longer has his albuterol inhaler.  His shortness of breath has been worsening more recently.  He denies chest pain.  He is unfamiliar with any diagnosis of COPD.  Endorses diarrhea as well.   Patient states he is an alcoholic.  He last drank yesterday evening, starting at 5 PM but unsure when he stopped drinking.  Reports that he drank 120 ounces of beer which is his typical daily intake.  Reports history of withdrawal with cold sweats, fevers, and seizures.  Also reports "light stroke" in the context of withdrawal history but denies any lasting deficits.  Says he wants to get a sponsor and start redoing step work.  Reports increased anxiety and requests something to help him sleep.  Patient reports having taken Keppra in the past but was unable to keep taking this after more recent discharge.  He states that when he drinks he does not worry  about taking this.    PMH EtOH abuse Hx withdrawal seizures SAH Tobacco abuse  PSH Hip  MEDS No medications. Used to have an albuterol inhaler but does not have this any longer.  SOCHX Smokes cigarettes, 1 PPD, since age 72. Is unhomed, has a shed he stays in. No illicit substance use.  FAMHX F: Alcoholic, deceased M: Doesn't speak to her. No other known family history.  ED Course: Patient presented with cough, congestion, and left lower axillary pain.  He was afebrile but tachycardic and tachypneic.  Chest x-ray demonstrated left-sided opacification and azithromycin and ceftriaxone were started.  Meds:  No outpatient medications have been marked as taking for the 09/14/22 encounter [facility] Encounter).    Past Medical History:  Diagnosis Date   Depression    ETOH abuse    Hepatic steatosis 10/30/2021   Seizure (HCC)    Subarachnoid hemorrhage (HCC) 07/28/2017   Tobacco abuse 10/27/2021    Past Surgical History:  Procedure Laterality Date   ORIF PELVIC FRACTURE WITH PERCUTANEOUS SCREWS Left 04/17/2022   Procedure: SI SCREW AND ORIF SYMPHYSIS PELVIC RING;  Surgeon: Roby Lofts, MD;  Location: [facility] OR;  Service: Orthopedics;  Laterality: Left;   Allergies: Allergies as of 09/14/2022   (No Known Allergies)    Review of Systems: A complete ROS was negative except as per HPI.  OBJECTIVE:   Physical Exam: Blood pressure (!) 141/90, pulse 92, temperature 98.6 F (37 C), temperature source Oral, resp. rate (!) 23, height 5\' 8"  (1.727 m), weight 74.8 kg, SpO2 98 %.  Constitutional: Disheveled male sitting in hospital bed, in no acute distress HENT: normocephalic atraumatic, mucous membranes moist Eyes: conjunctiva non-erythematous Neck: supple Cardiovascular: regular rate and rhythm, no m/r/g Pulmonary/Chest: normal work of breathing on room air, diffuse inspiratory and expiratory wheezing on auscultation Abdominal: soft, non-tender, non-distended MSK: normal  bulk and tone Neurological: alert & oriented x 3 Skin: warm and dry Psych: Anxious  Labs: CBC    Component Value Date/Time   WBC 11.9 (H) 09/14/2022 1455   RBC 3.65 (L) 09/14/2022 1455   HGB 12.1 (L) 09/14/2022 1455   HCT 34.7 (L) 09/14/2022 1455   PLT 300 09/14/2022 1455   MCV 95.1 09/14/2022 1455   MCH 33.2 09/14/2022 1455   MCHC 34.9 09/14/2022 1455   RDW 12.9 09/14/2022 1455   LYMPHSABS 1.0 04/07/2022 0827   MONOABS 0.6 04/07/2022 0827   EOSABS 0.3 04/07/2022 0827   BASOSABS 0.1 04/07/2022 0827     CMP     Component Value Date/Time   NA 127 (L) 09/14/2022 1455   K 4.0 09/14/2022 1455   CL 89 (L) 09/14/2022 1455   CO2 22 09/14/2022 1455   GLUCOSE 94 09/14/2022 1455   BUN 9 09/14/2022 1455   CREATININE 0.84 09/14/2022 1455   CALCIUM 9.7 09/14/2022 1455   PROT 6.8 08/31/2022 1625   ALBUMIN 3.3 (L) 08/31/2022 1625   AST 78 (H) 08/31/2022 1625   ALT 49 (H) 08/31/2022 1625   ALKPHOS 96 08/31/2022 1625   BILITOT 0.9 08/31/2022 1625   GFRNONAA >60 09/14/2022 1455   GFRAA >60 07/28/2017 0326    Imaging: CT Angio Chest PE W and/or Wo Contrast  Result Date: 09/14/2022 CLINICAL DATA:  Shortness of Breath 75 ml omni 350^31mL OMNIPAQUE IOHEXOL 350 MG/ML SOLNPulmonary embolism (PE) suspected, low to intermediate prob, positive D-dimer EXAM: CT ANGIOGRAPHY CHEST WITH CONTRAST TECHNIQUE: Multidetector CT imaging of the chest was performed using the standard protocol during bolus administration of intravenous contrast. Multiplanar CT image reconstructions and MIPs were obtained to evaluate the vascular anatomy. RADIATION DOSE REDUCTION: This exam was performed according to the departmental dose-optimization program which includes automated exposure control, adjustment of the mA and/or kV according to patient size and/or use of iterative reconstruction technique. CONTRAST:  75mL OMNIPAQUE IOHEXOL 350 MG/ML SOLN COMPARISON:  CT chest 04/16/2022 FINDINGS: Cardiovascular: No filling  defects within the pulmonary arteries to suggest acute pulmonary embolism. No acute findings the thoracic aorta branches. Mediastinum/Nodes: No axillary or supraclavicular adenopathy. No mediastinal or hilar adenopathy. No pericardial fluid. Esophagus normal. Lungs/Pleura: Fine nodular pattern in the RIGHT upper lobe in a airways pattern. Similar findings in the posterior RIGHT lower lobe to lesser degree. Similar findings in the LEFT upper lobe. Upper Abdomen: Low-attenuation liver consistent hepatic steatosis Musculoskeletal: No fracture or dislocation. Review of the MIP images confirms the above findings. IMPRESSION: 1. No evidence acute pulmonary embolism. 2. Fine nodular pattern in the RIGHT upper lobe and to a lesser degree the RIGHT lower lobe and LEFT upper lobe. Differential includes atypical infection, bronchiolitis, or hypersensitivity pneumonitis. 3. Hepatic steatosis. Electronically Signed   By: Genevive Bi M.D.   On: 09/14/2022 20:21   DG Chest 2 View  Result Date: 09/14/2022 CLINICAL DATA:  Cough.  Chest pain. EXAM: CHEST - 2 VIEW COMPARISON:  Multiple chest  x-rays since April 16, 2022. CT of the chest April 16, 2022. FINDINGS: The heart, hila, and mediastinum are normal. No pneumothorax. Mild opacity in the lateral left lung base. Haziness over the periphery of the right lung base. No other acute abnormalities. IMPRESSION: 1. Mild opacity in the lateral left lung base could represent atelectasis or early infiltrate. Recommend short-term follow-up to ensure resolution. 2. Haziness over the periphery of the right lung base may represent subtle infiltrate versus overlapping soft tissues. Recommend attention on follow-up. Electronically Signed   By: Gerome Sam III M.D.   On: 09/14/2022 15:27    EKG: personally reviewed my interpretation is normal sinus rhythm, nonischemic.  ASSESSMENT & PLAN:   Assessment & Plan by Problem: Principal Problem:   Pneumonia   Gary Mclaughlin is  a 52 yo male with PMHx that includes alcohol dependence, seizure disorder, hepatic steatosis, subarachnoid hemorrhage, and COPD who presented with shortness of breath.  # Possible pneumonia # Presumed COPD exacerbation Patient presents with productive cough and shortness of breath over the course of several days.  Also with left lateral chest/axillary pain.  D-dimer elevated, mild troponinemia but no PE on CT angio chest, ECG nonischemic and troponins returned to normal (likely due to demand ischemia). History of alcohol use disorder (last used yesterday) increases suspicion for aspiration pneumonia though chest CT with bilateral findings suggestive of atypical pneumonia.  Vitals largely normal, only with mild tachypnea.  Mild leukocytosis.  Exam with diffuse wheezing consistent with presumed COPD exacerbation given smoking history and previous diagnosis.  No formal PFTs on file though. -Continue azithromycin, ceftriaxone -DuoNebs every 4 hours -RVP -Trend CBC  # AUD, history of delirium tremens Patient presents with alcohol use disorder with a history of delirium tremens.  He reports some anxiety this evening but is hemodynamically stable. Previously prescribed Keppra.  States that he did not take after his last discharge due to reasons outside his control.  -CIWA protocol with lorazepam -will reach out to neurology to discuss whether restarting Keppra is appropriate -Folic acid -Thiamine -Multivitamin -trend BMP  #Hyponatremia Suspect this is related to his alcohol use.  Appears euvolemic on exam. Differential is broad and includes SIADH, beer potomania, glucocorticoid def, hypothyroidism, renal insufficiency.  -Fluid restriction -Urine Na -Urine osmolarity  #Tobacco use Patient endorses smoking about a pack a day since he was a teenager. -nicotine patch  Diet: Heart Healthy VTE:  Xarelto IVF: LR,Bolus Code: Full  Prior to Admission Living Arrangement: Homeless Anticipated  Discharge Location:  Prior Barriers to Discharge: Medical management  Dispo: Admit patient to Inpatient with expected length of stay greater than 2 midnights.  Signed: Adron Bene, MD Internal Medicine Resident PGY-1  09/14/2022, 10:41 PM

## 2022-09-15 DIAGNOSIS — J440 Chronic obstructive pulmonary disease with acute lower respiratory infection: Secondary | ICD-10-CM

## 2022-09-15 DIAGNOSIS — Z87891 Personal history of nicotine dependence: Secondary | ICD-10-CM

## 2022-09-15 DIAGNOSIS — F1010 Alcohol abuse, uncomplicated: Secondary | ICD-10-CM

## 2022-09-15 DIAGNOSIS — E871 Hypo-osmolality and hyponatremia: Secondary | ICD-10-CM

## 2022-09-15 DIAGNOSIS — J189 Pneumonia, unspecified organism: Secondary | ICD-10-CM | POA: Diagnosis not present

## 2022-09-15 LAB — COMPREHENSIVE METABOLIC PANEL
ALT: 42 U/L (ref 0–44)
AST: 55 U/L — ABNORMAL HIGH (ref 15–41)
Albumin: 2.6 g/dL — ABNORMAL LOW (ref 3.5–5.0)
Alkaline Phosphatase: 112 U/L (ref 38–126)
Anion gap: 8 (ref 5–15)
BUN: 10 mg/dL (ref 6–20)
CO2: 26 mmol/L (ref 22–32)
Calcium: 9 mg/dL (ref 8.9–10.3)
Chloride: 95 mmol/L — ABNORMAL LOW (ref 98–111)
Creatinine, Ser: 0.76 mg/dL (ref 0.61–1.24)
GFR, Estimated: >60 mL/min (ref >60)
Glucose, Bld: 94 mg/dL (ref 70–99)
Potassium: 3.2 mmol/L — ABNORMAL LOW (ref 3.5–5.1)
Sodium: 129 mmol/L — ABNORMAL LOW (ref 135–145)
Total Bilirubin: 0.8 mg/dL (ref 0.3–1.2)
Total Protein: 6.4 g/dL — ABNORMAL LOW (ref 6.5–8.1)

## 2022-09-15 LAB — CBC
HCT: 30 % — ABNORMAL LOW (ref 39.0–52.0)
Hemoglobin: 10.4 g/dL — ABNORMAL LOW (ref 13.0–17.0)
MCH: 33.5 pg (ref 26.0–34.0)
MCHC: 34.7 g/dL (ref 30.0–36.0)
MCV: 96.8 fL (ref 80.0–100.0)
Platelets: 302 10*3/uL (ref 150–400)
RBC: 3.1 MIL/uL — ABNORMAL LOW (ref 4.22–5.81)
RDW: 13.2 % (ref 11.5–15.5)
WBC: 8 10*3/uL (ref 4.0–10.5)
nRBC: 0 % (ref 0.0–0.2)

## 2022-09-15 LAB — SODIUM, URINE, RANDOM: Sodium, Ur: <10 mmol/L

## 2022-09-15 LAB — RESPIRATORY PANEL BY PCR

## 2022-09-15 LAB — BASIC METABOLIC PANEL
Anion gap: 10 (ref 5–15)
BUN: 12 mg/dL (ref 6–20)
CO2: 27 mmol/L (ref 22–32)
Calcium: 9.5 mg/dL (ref 8.9–10.3)
Chloride: 94 mmol/L — ABNORMAL LOW (ref 98–111)
Creatinine, Ser: 0.8 mg/dL (ref 0.61–1.24)
GFR, Estimated: >60 mL/min (ref >60)
Glucose, Bld: 178 mg/dL — ABNORMAL HIGH (ref 70–99)
Potassium: 4.1 mmol/L (ref 3.5–5.1)
Sodium: 131 mmol/L — ABNORMAL LOW (ref 135–145)

## 2022-09-15 LAB — OSMOLALITY, URINE: Osmolality, Ur: 495 mOsm/kg (ref 300–900)

## 2022-09-15 LAB — MRSA NEXT GEN BY PCR, NASAL: MRSA by PCR Next Gen: NOT DETECTED

## 2022-09-15 LAB — VITAMIN B12: Vitamin B-12: 518 pg/mL (ref 180–914)

## 2022-09-15 LAB — MAGNESIUM: Magnesium: 2 mg/dL (ref 1.7–2.4)

## 2022-09-15 LAB — SARS CORONAVIRUS 2 BY RT PCR: SARS Coronavirus 2 by RT PCR: NEGATIVE

## 2022-09-15 LAB — FOLATE: Folate: 15.3 ng/mL (ref >5.9)

## 2022-09-15 MED ORDER — MELATONIN 3 MG PO TABS
3.0000 mg | ORAL_TABLET | Freq: Every day | ORAL | Status: DC
  Administered 2022-09-15 – 2022-09-16 (×3): 3 mg via ORAL
  Filled 2022-09-15 (×3): qty 1

## 2022-09-15 MED ORDER — NICOTINE 14 MG/24HR TD PT24
14.0000 mg | MEDICATED_PATCH | Freq: Every day | TRANSDERMAL | Status: DC
  Administered 2022-09-15 – 2022-09-17 (×4): 14 mg via TRANSDERMAL
  Filled 2022-09-15 (×4): qty 1

## 2022-09-15 MED ORDER — POTASSIUM CHLORIDE 20 MEQ PO PACK
40.0000 meq | PACK | Freq: Once | ORAL | Status: AC
  Administered 2022-09-15: 40 meq via ORAL
  Filled 2022-09-15: qty 2

## 2022-09-15 MED ORDER — PREDNISONE 20 MG PO TABS
40.0000 mg | ORAL_TABLET | Freq: Every day | ORAL | Status: DC
  Administered 2022-09-15 – 2022-09-17 (×3): 40 mg via ORAL
  Filled 2022-09-15 (×3): qty 2

## 2022-09-15 MED ORDER — MOMETASONE FURO-FORMOTEROL FUM 100-5 MCG/ACT IN AERO
2.0000 | INHALATION_SPRAY | Freq: Two times a day (BID) | RESPIRATORY_TRACT | Status: DC
  Administered 2022-09-15 – 2022-09-17 (×3): 2 via RESPIRATORY_TRACT
  Filled 2022-09-15: qty 8.8

## 2022-09-15 MED ORDER — POTASSIUM CHLORIDE CRYS ER 20 MEQ PO TBCR
40.0000 meq | EXTENDED_RELEASE_TABLET | Freq: Once | ORAL | Status: AC
  Administered 2022-09-15: 40 meq via ORAL
  Filled 2022-09-15: qty 2

## 2022-09-15 MED ORDER — SODIUM CHLORIDE 0.9 % IV SOLN
1.0000 g | INTRAVENOUS | Status: DC
  Administered 2022-09-15 – 2022-09-16 (×2): 1 g via INTRAVENOUS
  Filled 2022-09-15 (×2): qty 10

## 2022-09-15 NOTE — ED Notes (Signed)
Pt eating breakfast at this time. States ativan helped his anxiety

## 2022-09-15 NOTE — ED Notes (Signed)
Tried to call report, no answer.
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
## 2022-09-15 NOTE — Plan of Care (Signed)

## 2022-09-15 NOTE — Progress Notes (Addendum)
Subjective:  Currently, the patient is feeling slightly better. Still having difficulty breathing and reports a cough productive of clear phlegm. Continues to feel anxious this morning. Denies fever, chills, tremors. Interested in receiving support to stopping using alcohol and would like to get connected to alcoholics anonymous.  Interval Events: - None    Objective:  Vital Signs:   Temp:  98.4 F (36.9 C)  Pulse Rate:  [75-107] 90  Resp:  [12-23] 18  BP: (104-152)/(69-117) 104/69 SpO2:  [92 %-99 %] 92 %  FiO2 (%):  [100 %] 100 %  Weight:  [66.9 kg-74.8 kg] 66.9 kg  Last BM Date : 09/15/22   Physical Exam: General: No acute distress; alert, appropriate and cooperative throughout examination.  Lungs:  Inspiratory & expiratory wheezing appreciated, diffusely; increased work of breathing on non-rebreather mask  Heart: RRR; no m/r/g   Abdomen:  BS normoactive. Soft, Nondistended, non-tender.  No masses or organomegaly.  Extremities: No pretibial edema.     Labs:  Basic Metabolic Panel: Recent Labs  Lab 09/14/22 1455 09/15/22 0300  NA 127* 129*  K 4.0 3.2*  CL 89* 95*  CO2 22 26  GLUCOSE 94 94  BUN 9 10  CREATININE 0.84 0.76  CALCIUM 9.7 9.0  MG  --  2.0    Liver Function Tests: Recent Labs  Lab 09/15/22 0300  AST 55*  ALT 42  ALKPHOS 112  BILITOT 0.8  PROT 6.4*  ALBUMIN 2.6*    CBC: Recent Labs  Lab 09/14/22 1455 09/15/22 0300  WBC 11.9* 8.0  HGB 12.1* 10.4*  HCT 34.7* 30.0*  MCV 95.1 96.8  PLT 300 302     MRSA Nasal Swab: negative   Assessment & Plan:  Pt is a 52 y.o. yo male with a history of alcohol use disorder, seizure disorder, and COPD who presented with shortness of breath likely secondary to pneumonia, acute COPD exacerbation.   #Exacerbation of COPD (GOLD D)  Probable pneumonia Patient continues to have diffuse inspiratory, expiratory wheezing noted on exam but notes slightly improved dyspnea. Per chart review, patient has  had multiple prior admissions for COPD exacerbation/respiratory failure and likely meets GOLD D grouping, which warrants triple therapy. Patient's exacerbation likely secondary to community-acquired pneumonia (consistent with CXR findings, history of productive cough). This appears to be improving as patient is afebrile with reduced WBC count.  -Continue IV ceftriaxone, PO azithromycin today & transition to Augmentin PO tomorrow  -Continue DuoNebs scheduled q4h  -Start Dulera 2 puffs BID  -Start prednisone 40 mg PO daily for 5 days    # AUD, history of delirium tremens Patient continues to experience withdrawal symptoms of anxiety but seems to be improving. Appears well-controlled with IV ativan and most recent CIWA score was 1 (for tremors). Will continue to monitor for symptoms of alcohol withdrawal and scheduled ativan taper. If symptoms worsen, will consider initiating librium taper.   -CIWA protocol with lorazepam -Neurology consult to discuss whether restarting Keppra is appropriate (for seizure disorder)  -Folic acid -Thiamine -Multivitamin   #Hyponatremia Na 127>129. Urine osmolality (495) and urine sodium (<10) with absence of edema, ascites is consistent with hypovolemia. Improvement with LR bolus is also consistent with this etiology. No history of cirrhosis, heart failure.  -Trend BMP -Encourage PO fluid intake     #Tobacco use Patient endorses smoking about a pack a day since he was a teenager. -Nicotine patch  DVT Ppx: Rivaroxaban 10 mg daily   Signed:  Lyda Kalata, Medical  Student 09/15/2022, 10:27 AM

## 2022-09-16 ENCOUNTER — Telehealth (HOSPITAL_COMMUNITY): Payer: Self-pay | Admitting: Pharmacy Technician

## 2022-09-16 ENCOUNTER — Other Ambulatory Visit (HOSPITAL_COMMUNITY): Payer: Self-pay

## 2022-09-16 ENCOUNTER — Other Ambulatory Visit: Payer: Self-pay

## 2022-09-16 LAB — BASIC METABOLIC PANEL
Anion gap: 9 (ref 5–15)
BUN: 14 mg/dL (ref 6–20)
CO2: 24 mmol/L (ref 22–32)
Calcium: 9.6 mg/dL (ref 8.9–10.3)
Chloride: 96 mmol/L — ABNORMAL LOW (ref 98–111)
Creatinine, Ser: 0.64 mg/dL (ref 0.61–1.24)
GFR, Estimated: >60 mL/min (ref >60)
Glucose, Bld: 207 mg/dL — ABNORMAL HIGH (ref 70–99)
Potassium: 3.9 mmol/L (ref 3.5–5.1)
Sodium: 129 mmol/L — ABNORMAL LOW (ref 135–145)

## 2022-09-16 LAB — PHOSPHORUS: Phosphorus: 2.1 mg/dL — ABNORMAL LOW (ref 2.5–4.6)

## 2022-09-16 LAB — MAGNESIUM: Magnesium: 1.6 mg/dL — ABNORMAL LOW (ref 1.7–2.4)

## 2022-09-16 MED ORDER — AMOXICILLIN-POT CLAVULANATE 875-125 MG PO TABS
1.0000 | ORAL_TABLET | Freq: Two times a day (BID) | ORAL | Status: DC
  Administered 2022-09-17: 1 via ORAL
  Filled 2022-09-16: qty 1

## 2022-09-16 MED ORDER — SODIUM CHLORIDE 0.9 % IV BOLUS
1000.0000 mL | Freq: Once | INTRAVENOUS | Status: AC
  Administered 2022-09-16: 1000 mL via INTRAVENOUS

## 2022-09-16 MED ORDER — CHLORDIAZEPOXIDE HCL 5 MG PO CAPS
25.0000 mg | ORAL_CAPSULE | Freq: Four times a day (QID) | ORAL | Status: DC | PRN
  Administered 2022-09-16 – 2022-09-17 (×3): 25 mg via ORAL
  Filled 2022-09-16 (×3): qty 5

## 2022-09-16 MED ORDER — MAGNESIUM SULFATE 2 GM/50ML IV SOLN
2.0000 g | Freq: Once | INTRAVENOUS | Status: AC
  Administered 2022-09-16: 2 g via INTRAVENOUS
  Filled 2022-09-16: qty 50

## 2022-09-16 MED ORDER — CHLORDIAZEPOXIDE HCL 5 MG PO CAPS
25.0000 mg | ORAL_CAPSULE | Freq: Three times a day (TID) | ORAL | Status: AC
  Administered 2022-09-16 (×3): 25 mg via ORAL
  Filled 2022-09-16 (×3): qty 5

## 2022-09-16 MED ORDER — POTASSIUM PHOSPHATES 15 MMOLE/5ML IV SOLN
15.0000 mmol | Freq: Once | INTRAVENOUS | Status: AC
  Administered 2022-09-16: 15 mmol via INTRAVENOUS
  Filled 2022-09-16: qty 5

## 2022-09-16 MED ORDER — LORAZEPAM 1 MG PO TABS
2.0000 mg | ORAL_TABLET | Freq: Once | ORAL | Status: AC
  Administered 2022-09-16: 2 mg via ORAL
  Filled 2022-09-16: qty 2

## 2022-09-16 MED ORDER — CHLORDIAZEPOXIDE HCL 5 MG PO CAPS
25.0000 mg | ORAL_CAPSULE | Freq: Two times a day (BID) | ORAL | Status: DC
  Administered 2022-09-17: 25 mg via ORAL
  Filled 2022-09-16: qty 5

## 2022-09-16 MED ORDER — CHLORDIAZEPOXIDE HCL 5 MG PO CAPS: 25.0000 mg | ORAL_CAPSULE | Freq: Every day | ORAL | Status: DC

## 2022-09-16 NOTE — Progress Notes (Signed)
Patient c/o worsening withdrawal symptoms.  CIWA completed, score 23.  Pt states that he feels like the librium isnt working.  Patient visibly upset and anxious, tremors noted.  Pt states that he wants a drink.  IMTS, Dr. Cliffton Asters paged and notified of findings.

## 2022-09-16 NOTE — Telephone Encounter (Signed)
Pharmacy Patient Advocate Encounter  Insurance verification completed.    The patient is insured through [facility]   The patient is currently admitted and ran test claims for the following: Spiriva, Trelegy, Dulera.  Copays and coinsurance results were relayed to Inpatient clinical team.

## 2022-09-16 NOTE — TOC Benefit Eligibility Note (Signed)
Patient Product/process development scientist completed.    The patient is currently admitted and upon discharge could be taking Dulera 200.  The current 30 day co-pay is $4.00.   The patient is currently admitted and upon discharge could be taking Spiriva.  The current 30 day co-pay is $4.00.   The patient is currently admitted and upon discharge could be taking Trelegy 200.  Requires Prior Authorization  The patient is insured through [facility] Medicaid   This test claim was processed through [facility]- copay amounts may vary at other pharmacies due to pharmacy/plan contracts, or as the patient moves through the different stages of their insurance plan.  Gary Mclaughlin, CPHT Pharmacy Patient Advocate Specialist [facility] Patient Advocate Team Direct Number: [phone]  Fax: [phone]

## 2022-09-16 NOTE — Plan of Care (Signed)

## 2022-09-16 NOTE — Progress Notes (Signed)
Subjective:  Currently, the patient is feeling better. Eating breakfast. Continues to feel anxious, having tremors. Currently craving a beer. He saw the leg on the walker moving around this morning. No tactile hallucinations. Breathing is much better today and cough is improved but he still has some pain around his ribs.    Interval Events: - Ativan (x) overnight (20:30, 00:21)    Objective:  Vital Signs:   Temp:  98.5 F (36.9 C)  Pulse Rate:  [72-96] 80 Resp:  [15-18] 15  BP: (104-141)/(69-94) 120/80  SpO2:  [88 %-96 %] 96% on RA  Weight:  [66.9 kg] 66.9 kg  Last BM Date : 09/15/22   Physical Exam: General: No acute distress; alert, appropriate and cooperative throughout examination.  Lungs:  Normal work of breathing on RA; inspiratory & expiratory wheezing appreciated, diffusely  Heart: RRR; no m/r/g   Abdomen:  BS normoactive. Soft, Nondistended, non-tender.  No masses or organomegaly.  Extremities: No pretibial edema.     Labs:  Basic Metabolic Panel: Recent Labs  Lab 09/14/22 1455 09/15/22 0300 09/15/22 1437 09/16/22 0026  NA 127* 129* 131* 129*  K 4.0 3.2* 4.1 3.9  CL 89* 95* 94* 96*  CO2 22 26 27 24   GLUCOSE 94 94 178* 207*  BUN 9 10 12 14   CREATININE 0.84 0.76 0.80 0.64  CALCIUM 9.7 9.0 9.5 9.6  MG  --  2.0  --  1.6*  PHOS  --   --   --  2.1*     Assessment & Plan:  Pt is a 52 y.o. yo male with a history of alcohol use disorder and COPD, admitted for shortness of breath secondary to COPD exacerbation in setting of likely pneumonia.   #Exacerbation of COPD  Likely community-acquired pneumonia Patient has improved with decreased dyspnea. Continues to have diffuse inspiratory, expiratory wheezing noted on exam, however. but notes slightly improved dyspnea. Infection appears to be responding well to antibiotics as patient remains afebrile with reduced cough.  appears well-controlled as patient remains afebrile.  -Transition to Augmentin PO tomorrow  (for total 7 day course ending on 05/18) -Continue DuoNebs scheduled q4h  -Continue Dulera 2 puffs BID  -Continue prednisone 40 mg PO daily for 5 days  -PFTs in outpatient  -Pharmacy financial assistance for breathing treatment upon discharge    # AUD, history of delirium tremens Last drink was Sunday night (~36 hours ago). Patient continues to experience withdrawal symptoms, most recent CIWA score of 11. Also received a 4 mg dose of lorazepam last night. Will switch to librium taper today given continued symptoms of withdrawal.  -CIWA protocol with librium (q6h PRN PO)  -Today: librium 25 mg PO TID; 5/15: librium 25 mg PO BID; 5/16: librium 25 mg PO daily   -Connect to AA resources while hospitalized  -Folic acid -Thiamine -Multivitamin   #Hyponatremia Na 127>129>131>129. Urine osmolality (495) and urine sodium (<10) with absence of edema, ascites is consistent with hypovolemia. Improvement with LR bolus is also consistent with this etiology. No history of cirrhosis, heart failure.  -Trend BMP -1L NS Bolus -Encourage PO fluid intake    #Hypomagnesemia, hypophosphatemia  Mg 1.6, Phos 2.1. Likely secondary to malnutrition with subsequent mild refeeding syndrome. Will replete for normal phos, Mg>2.    #Normocytic anemia  Patient has a long history of normocytic anemia. Likely multifactorial and related to malnutrition given patient's history of alcohol use disorder. B12, folate normal during this admission.  -Iron panel in outpatient setting.    #  Tobacco use Patient endorses smoking about a pack a day since he was a teenager. -Nicotine patch  DVT Ppx: Rivaroxaban 10 mg daily   Signed:  Lyda Kalata, Medical Student 09/16/2022, 5:59 AM

## 2022-09-17 ENCOUNTER — Other Ambulatory Visit (HOSPITAL_COMMUNITY): Payer: Self-pay

## 2022-09-17 LAB — BASIC METABOLIC PANEL
Anion gap: 10 (ref 5–15)
BUN: 12 mg/dL (ref 6–20)
CO2: 26 mmol/L (ref 22–32)
Calcium: 9.5 mg/dL (ref 8.9–10.3)
Chloride: 97 mmol/L — ABNORMAL LOW (ref 98–111)
Creatinine, Ser: 0.8 mg/dL (ref 0.61–1.24)
GFR, Estimated: >60 mL/min (ref >60)
Glucose, Bld: 161 mg/dL — ABNORMAL HIGH (ref 70–99)
Potassium: 3.7 mmol/L (ref 3.5–5.1)
Sodium: 133 mmol/L — ABNORMAL LOW (ref 135–145)

## 2022-09-17 MED ORDER — NICOTINE 14 MG/24HR TD PT24
14.0000 mg | MEDICATED_PATCH | Freq: Every day | TRANSDERMAL | 0 refills | Status: DC
  Filled 2022-09-17: qty 28, 28d supply, fill #0

## 2022-09-17 MED ORDER — SPIRIVA RESPIMAT 1.25 MCG/ACT IN AERS
2.0000 | INHALATION_SPRAY | Freq: Every day | RESPIRATORY_TRACT | 0 refills | Status: DC
  Filled 2022-09-17: qty 4, 30d supply, fill #0

## 2022-09-17 MED ORDER — CHLORDIAZEPOXIDE HCL 25 MG PO CAPS
25.0000 mg | ORAL_CAPSULE | Freq: Once | ORAL | 0 refills | Status: AC
  Filled 2022-09-17: qty 1, 1d supply, fill #0

## 2022-09-17 MED ORDER — GUAIFENESIN ER 600 MG PO TB12: 600.0000 mg | ORAL_TABLET | Freq: Two times a day (BID) | ORAL | Status: DC | PRN

## 2022-09-17 MED ORDER — LIDOCAINE 5 % EX PTCH
1.0000 | MEDICATED_PATCH | CUTANEOUS | Status: DC
  Administered 2022-09-17: 1 via TRANSDERMAL
  Filled 2022-09-17: qty 1

## 2022-09-17 MED ORDER — AMOXICILLIN-POT CLAVULANATE 875-125 MG PO TABS
1.0000 | ORAL_TABLET | Freq: Two times a day (BID) | ORAL | 0 refills | Status: DC
  Filled 2022-09-17: qty 2, 1d supply, fill #0

## 2022-09-17 MED ORDER — FLUTICASONE-SALMETEROL 100-50 MCG/ACT IN AEPB
1.0000 | INHALATION_SPRAY | Freq: Two times a day (BID) | RESPIRATORY_TRACT | 12 refills | Status: DC
  Filled 2022-09-17: qty 60, 30d supply, fill #0

## 2022-09-17 MED ORDER — TRELEGY ELLIPTA 100-62.5-25 MCG/ACT IN AEPB
1.0000 | INHALATION_SPRAY | Freq: Every day | RESPIRATORY_TRACT | 0 refills | Status: DC
  Filled 2022-09-17: qty 60, 30d supply, fill #0

## 2022-09-17 MED ORDER — AMOXICILLIN-POT CLAVULANATE 875-125 MG PO TABS
1.0000 | ORAL_TABLET | Freq: Two times a day (BID) | ORAL | 0 refills | Status: AC
  Filled 2022-09-17: qty 3, 2d supply, fill #0

## 2022-09-17 MED ORDER — PREDNISONE 20 MG PO TABS
40.0000 mg | ORAL_TABLET | Freq: Every day | ORAL | 0 refills | Status: AC
  Filled 2022-09-17: qty 4, 2d supply, fill #0

## 2022-09-17 NOTE — Discharge Summary (Addendum)
Name: Armonie Crissinger MRN: [id number] DOB: 1970-07-01 52 y.o. PCP: Pcp, No  Date of Admission: 09/14/2022  2:36 PM Date of Discharge: 09/17/2022 Attending Physician: Ginnie Smart, MD  Discharge Diagnosis: 1. Principal Problem:   Community acquired pneumonia of right middle lobe of lung Active Problems:   Alcohol abuse   Hyponatremia    Discharge Medications: Allergies as of 09/17/2022   No Known Allergies      Medication List     TAKE these medications    amoxicillin-clavulanate 875-125 MG tablet Commonly known as: AUGMENTIN Take 1 tablet by mouth every 12 (twelve) hours for 3 doses.   chlordiazePOXIDE 25 MG capsule Commonly known as: LIBRIUM Take 1 capsule (25 mg total) by mouth once for 1 dose. Start taking on: Sep 18, 2022   fluticasone-salmeterol 45-21 MCG/ACT inhaler Commonly known as: Advair HFA Inhale 2 puffs into the lungs 2 (two) times daily.   nicotine 14 mg/24hr patch Commonly known as: NICODERM CQ - dosed in mg/24 hours Place 1 patch (14 mg total) onto the skin daily.   predniSONE 20 MG tablet Commonly known as: DELTASONE Take 2 tablets (40 mg total) by mouth daily with breakfast for 2 days. Start taking on: Sep 18, 2022   tiotropium 18 MCG inhalation capsule Commonly known as: Spiriva HandiHaler Place 1 capsule (18 mcg total) into inhaler and inhale daily.        Disposition and follow-up:   Mr.Jaymason Inman was discharged from [facility] in Stable condition.  At the hospital follow up visit please address:  1.   Pneumonia -f/u CBC  Alcohol use disorder -monitor naltrexone use -facilitate adherence to AA  COPDTobacco use disorder -titrate nicotine patches, discharged with 28 count 14mg  patches -continue to counsel on cessation -PFTs  2.  Labs / imaging needed at time of follow-up: CBC  3.  Pending labs/ test needing follow-up: NA  Follow-up Appointments:  Follow-up Information     Hoy Register, MD.  Call.   Specialty: Family Medicine Why: TIME : 9:15 AM DATE Jarvis Newcomer Contact information: [address] [place] [address] [phone]         Medicaid transportaiton number Follow up.   Why: Call a few days prior to apts for transportation Contact information: [phone]                Hospital Course by problem list: #Exacerbation of COPD  Likely community-acquired pneumonia Patient is a 52 y/o male with history of alcohol use disorder, hepatic steatosis, and COPD who was admitted on 05/12 with worsening shortness of breath likely secondary to COPD exacerbation in setting of community-acquired pneumonia. One week prior to presentation, patient developed a cough productive of sputum that was green to white in color. Also endorses left lateral rib pain that is worse with coughing. Patient has been without albuterol inhaler for an unknown period of time. Endorses diarrhea. WBC count was elevated at 11.9. Inspiratory, expiratory wheezing noted on lung exam. No fever, chills, chest pain. On admission, patient's CXR was notable for bilateral opacities/haziness at lung bases, raising concerns for community-acquired pneumonia. CT angio chest demonstrated nodular pattern consistent with possible atypical infection. Patient was started on IV ceftriaxone, azithromycin the transitioned to augmentin PO. Since patient has had prior hospitalizations for COPD, he meets criteria for triple therapy and was started on duoneb, dulera as well as oral prednisone. During admission, patient responded well to medical management. He continued to have mild  expiratory wheezing with cough productive of white sputum but remained afebrile with normal WBC count. Also reported reduced dyspnea. On discharge, patient was provided with Trilegy for breathing treatment and advised to continued oral prednisone (until 5/17) and augmentin (until 5/16). Will need to establish care at [facility] to  receive long-term assistance for breathing treatment as well as PFTs.      #Alcohol withdrawal   Patient has a history of alcohol use disorder with withdrawal symptoms notable for delirium tremens. Has also experienced cold sweats, fever, and seizures with withdrawal in the past. Typically drinks ~120 ounces of beer daily and his last drink was ~5 pm on day of admission. Was started on CIWA protocol w/ ativan with some relief of anxiety, tremors. During hospitalization, patient continued to experience anxiety and was transitioned with librium taper. Also noted increased cravings for beer. Prior to discharge, patient was advised to continue librium taper for one more day (ending on 05/16 with 25 mg librium PO) and was given naltrexone to reduce cravings. He has no evidence of severe liver dysfunction, no signs or prior diagnosis of cirrhosis. Of note, patient does not have a history of opiate use or IVDU. At discharge CIWA scores all <6.  He previously worked with a sponsor through [employer] in [place], [place] with a good response and was interested in connecting with AA in [place], [place].    #Hyponatremia Patient was hyponatremic (Na: 127) on admission. Urine osmolality (495) and urine sodium (<10) with absence of edema, ascites is consistent with hypovolemia. Improved (Na: 133) with normal saline bolus and fluid resuscitation during hospitalization.  #Hypomagnesemia, hypophosphatemia  Mg 1.6, Phos 2.1 during admission. Likely secondary to malnutrition in setting of heavy alcohol use. Electrolytes were given during admission.   #Normocytic anemia  Patient has a long history of normocytic anemia. Hgb 12.>10.4 during hospitalization, near baseline of ~10 per chart review. Likely multifactorial and related to malnutrition given patient's history of alcohol use disorder. B12, folate were normal, however. Would benefit from iron panel in outpatient setting (last: 07/2017).    #Tobacco use Patient endorses smoking  about a pack a day since the age of 76. Was given nicotine patch on discharge.  Discharge Exam:   BP (!) 149/90 (BP Location: Right Arm)   Pulse 89   Temp 98.2 F (36.8 C) (Oral)   Resp 14   Ht 5\' 8"  (1.727 m)   Wt 66.9 kg   SpO2 95%   BMI 22.41 kg/m  Discharge exam:  General: No acute distress; alert, appropriate and cooperative throughout examination.  Lungs:  Normal work of breathing on RA; expiratory wheezing appreciated, diffusely  Heart: RRR; no m/r/g   Abdomen:  BS normoactive. Soft, Nondistended, non-tender.  No masses or organomegaly.  Extremities: No pretibial edema.    Pertinent Labs, Studies, and Procedures:     Latest Ref Rng & Units 09/15/2022    3:00 AM 09/14/2022    2:55 PM 08/31/2022    4:25 PM  CBC  WBC 4.0 - 10.5 K/uL 8.0  11.9  7.3   Hemoglobin 13.0 - 17.0 g/dL 69.6  29.5  28.4   Hematocrit 39.0 - 52.0 % 30.0  34.7  32.2   Platelets 150 - 400 K/uL 302  300  295        Latest Ref Rng & Units 09/17/2022   12:37 AM 09/16/2022   12:26 AM 09/15/2022    2:37 PM  BMP  Glucose 70 - 99 mg/dL 132  207  178   BUN 6 - 20 mg/dL 12  14  12    Creatinine 0.61 - 1.24 mg/dL 8.11  9.14  7.82   Sodium 135 - 145 mmol/L 133  129  131   Potassium 3.5 - 5.1 mmol/L 3.7  3.9  4.1   Chloride 98 - 111 mmol/L 97  96  94   CO2 22 - 32 mmol/L 26  24  27    Calcium 8.9 - 10.3 mg/dL 9.5  9.6  9.5    CT Angio Chest PE W and/or Wo Contrast  Result Date: 09/14/2022 CLINICAL DATA:  Shortness of Breath 75 ml omni 350^9mL OMNIPAQUE IOHEXOL 350 MG/ML SOLNPulmonary embolism (PE) suspected, low to intermediate prob, positive D-dimer EXAM: CT ANGIOGRAPHY CHEST WITH CONTRAST TECHNIQUE: Multidetector CT imaging of the chest was performed using the standard protocol during bolus administration of intravenous contrast. Multiplanar CT image reconstructions and MIPs were obtained to evaluate the vascular anatomy. RADIATION DOSE REDUCTION: This exam was performed according to the departmental  dose-optimization program which includes automated exposure control, adjustment of the mA and/or kV according to patient size and/or use of iterative reconstruction technique. CONTRAST:  75mL OMNIPAQUE IOHEXOL 350 MG/ML SOLN COMPARISON:  CT chest 04/16/2022 FINDINGS: Cardiovascular: No filling defects within the pulmonary arteries to suggest acute pulmonary embolism. No acute findings the thoracic aorta branches. Mediastinum/Nodes: No axillary or supraclavicular adenopathy. No mediastinal or hilar adenopathy. No pericardial fluid. Esophagus normal. Lungs/Pleura: Fine nodular pattern in the RIGHT upper lobe in a airways pattern. Similar findings in the posterior RIGHT lower lobe to lesser degree. Similar findings in the LEFT upper lobe. Upper Abdomen: Low-attenuation liver consistent hepatic steatosis Musculoskeletal: No fracture or dislocation. Review of the MIP images confirms the above findings. IMPRESSION: 1. No evidence acute pulmonary embolism. 2. Fine nodular pattern in the RIGHT upper lobe and to a lesser degree the RIGHT lower lobe and LEFT upper lobe. Differential includes atypical infection, bronchiolitis, or hypersensitivity pneumonitis. 3. Hepatic steatosis. Electronically Signed   By: Genevive Bi M.D.   On: 09/14/2022 20:21   DG Chest 2 View  Result Date: 09/14/2022 CLINICAL DATA:  Cough.  Chest pain. EXAM: CHEST - 2 VIEW COMPARISON:  Multiple chest x-rays since April 16, 2022. CT of the chest April 16, 2022. FINDINGS: The heart, hila, and mediastinum are normal. No pneumothorax. Mild opacity in the lateral left lung base. Haziness over the periphery of the right lung base. No other acute abnormalities. IMPRESSION: 1. Mild opacity in the lateral left lung base could represent atelectasis or early infiltrate. Recommend short-term follow-up to ensure resolution. 2. Haziness over the periphery of the right lung base may represent subtle infiltrate versus overlapping soft tissues. Recommend  attention on follow-up. Electronically Signed   By: Gerome Sam III M.D.   On: 09/14/2022 15:27     Discharge Instructions: Discharge Instructions     Diet - low sodium heart healthy   Complete by: As directed    Increase activity slowly   Complete by: As directed    No wound care   Complete by: As directed      You were hospitalized for shortness of breath and cough. You had evidence of pneumonia. The pneumonia caused temporary worsening of your COPD. We treated you with antibiotics and steroids to help.  You will continue these at home. We also treated you for alcohol withdrawal, you will complete the treatment for this after discharge. We will also discharge you with a medicine to  help decrease alcohol cravings. You should try to resume AA and avoid alcohol. With the medicine to decrease alcohol cravings, it is important that you avoid opioid pain meds or injectable opioids such as heroin. We also sent you home with nicotine patches to help you quit smoking. You should not smoke and use these a tthe same time. Med Changes:  Take prednisone 40mg  1 tablet on 09/18/2022 Take Augmentin 875-125mg  1 tablet every 12 hours: Take 1 tablet in the PM of 09/17/2022, then take 1 tablet in the AM and 1 tablet in the PM of 09/18/2022 Take chlordiazepoxide(librium) 1 tablet on 09/18/2022 Place 1 capsule (18 mcg total) into Spiriva (tiotropium) inhaler and inhale daily.  Inhale 2 puffs into the lungs 2 (two) times daily of Advair inhaler (Fluticasone-salmeterol).Rinse mouth after with water to prevent fungal infection of the mouth. Place 1 nicotine patch on the arm daily.  It was a pleasure caring for you!  Signed: Willette Cluster, MD 09/17/2022, 11:03 AM   Pager: Willette Cluster, MD Internal Medicine Resident, PGY-1 [facility]  Pager: [phone]

## 2022-09-17 NOTE — Discharge Instructions (Addendum)
You were hospitalized for shortness of breath and cough. You had evidence of pneumonia. The pneumonia caused temporary worsening of your COPD. We treated you with antibiotics and steroids to help.  You will continue these at home. We also treated you for alcohol withdrawal, you will complete the treatment for this after discharge. We will also discharge you with a medicine to help decrease alcohol cravings. You should try to resume AA and avoid alcohol. With the medicine to decrease alcohol cravings, it is important that you avoid opioid pain meds or injectable opioids such as heroin. We also sent you home with nicotine patches to help you quit smoking. You should not smoke and use these a tthe same time. Med Changes:  Take prednisone 40mg  1 tablet on 09/18/2022 Take Augmentin 875-125mg  1 tablet every 12 hours: Take 1 tablet in the PM of 09/17/2022, then take 1 tablet in the AM and 1 tablet in the PM of 09/18/2022 Take chlordiazepoxide(librium) 1 tablet on 09/18/2022 Place 1 capsule (18 mcg total) into Spiriva (tiotropium) inhaler and inhale daily.  Inhale 2 puffs into the lungs 2 (two) times daily of Advair inhaler (Fluticasone-salmeterol).Rinse mouth after with water to prevent fungal infection of the mouth. Place 1 nicotine patch on the arm daily.  It was a pleasure caring for you!

## 2022-09-17 NOTE — Progress Notes (Signed)
Patient removed and is refusing heart monitoring at this time due to pt stated imminent discharge.   Sherilyn Banker, RN

## 2022-09-20 ENCOUNTER — Other Ambulatory Visit: Payer: Self-pay

## 2022-09-20 ENCOUNTER — Emergency Department (HOSPITAL_COMMUNITY): Payer: Medicaid Other

## 2022-09-20 ENCOUNTER — Emergency Department (HOSPITAL_COMMUNITY)
Admission: EM | Admit: 2022-09-20 | Discharge: 2022-09-20 | Disposition: A | Discharge status: Home / Self Care | Payer: Medicaid Other | Attending: Emergency Medicine | Admitting: Emergency Medicine

## 2022-09-20 ENCOUNTER — Encounter (HOSPITAL_COMMUNITY): Payer: Self-pay

## 2022-09-20 DIAGNOSIS — R051 Acute cough: Secondary | ICD-10-CM

## 2022-09-20 DIAGNOSIS — R0602 Shortness of breath: Secondary | ICD-10-CM | POA: Diagnosis present

## 2022-09-20 DIAGNOSIS — J441 Chronic obstructive pulmonary disease with (acute) exacerbation: Secondary | ICD-10-CM | POA: Insufficient documentation

## 2022-09-20 LAB — CBC
HCT: 30.6 % — ABNORMAL LOW (ref 39.0–52.0)
Hemoglobin: 9.9 g/dL — ABNORMAL LOW (ref 13.0–17.0)
MCH: 32.5 pg (ref 26.0–34.0)
MCHC: 32.4 g/dL (ref 30.0–36.0)
MCV: 100.3 fL — ABNORMAL HIGH (ref 80.0–100.0)
Platelets: 608 10*3/uL — ABNORMAL HIGH (ref 150–400)
RBC: 3.05 MIL/uL — ABNORMAL LOW (ref 4.22–5.81)
RDW: 13.6 % (ref 11.5–15.5)
WBC: 9.2 10*3/uL (ref 4.0–10.5)
nRBC: 0 % (ref 0.0–0.2)

## 2022-09-20 LAB — BASIC METABOLIC PANEL
Anion gap: 12 (ref 5–15)
BUN: 15 mg/dL (ref 6–20)
CO2: 23 mmol/L (ref 22–32)
Calcium: 10.1 mg/dL (ref 8.9–10.3)
Chloride: 98 mmol/L (ref 98–111)
Creatinine, Ser: 0.85 mg/dL (ref 0.61–1.24)
GFR, Estimated: >60 mL/min (ref >60)
Glucose, Bld: 107 mg/dL — ABNORMAL HIGH (ref 70–99)
Potassium: 3.9 mmol/L (ref 3.5–5.1)
Sodium: 133 mmol/L — ABNORMAL LOW (ref 135–145)

## 2022-09-20 LAB — TROPONIN I (HIGH SENSITIVITY)
Troponin I (High Sensitivity): 6 ng/L (ref <18)
Troponin I (High Sensitivity): 6 ng/L (ref <18)

## 2022-09-20 MED ORDER — BENZONATATE 100 MG PO CAPS
200.0000 mg | ORAL_CAPSULE | Freq: Once | ORAL | Status: AC
  Administered 2022-09-20: 200 mg via ORAL
  Filled 2022-09-20: qty 2

## 2022-09-20 MED ORDER — ALBUTEROL SULFATE HFA 108 (90 BASE) MCG/ACT IN AERS
2.0000 | INHALATION_SPRAY | RESPIRATORY_TRACT | Status: DC
  Administered 2022-09-20: 2 via RESPIRATORY_TRACT
  Filled 2022-09-20: qty 6.7

## 2022-09-20 MED ORDER — METHYLPREDNISOLONE SODIUM SUCC 125 MG IJ SOLR
125.0000 mg | Freq: Once | INTRAMUSCULAR | Status: AC
  Administered 2022-09-20: 125 mg via INTRAMUSCULAR
  Filled 2022-09-20: qty 2

## 2022-09-20 NOTE — Discharge Instructions (Signed)
Schedule follow up with primary care for recheck  

## 2022-09-20 NOTE — ED Provider Notes (Signed)
[place] EMERGENCY DEPARTMENT AT [facility] Provider Note   CSN: [id number] Arrival date & time: 09/20/22  1541     History  Chief Complaint  Patient presents with   Shortness of Breath    Gary Mclaughlin is a 52 y.o. male.  Pt reports he thinks he still has pneumonia.  Pt reports he is coughing up a large amount of phlegm.  Pt was discharged on 5/15.  Pt reports he is not drinking.  Pt has not been taking medications that were prescribed.  Pt is using a dulera inhaler  The history is provided by the patient and the EMS personnel. No language interpreter was used.  Shortness of Breath Severity:  Moderate Onset quality:  Gradual Duration:  1 week Timing:  Constant Progression:  Worsening Chronicity:  New Relieved by:  Nothing Worsened by:  Nothing Ineffective treatments:  None tried Associated symptoms: sputum production   Associated symptoms: no abdominal pain and no fever        Home Medications Prior to Admission medications   Medication Sig Start Date End Date Taking? Authorizing Provider  fluticasone-salmeterol (ADVAIR DISKUS) 100-50 MCG/ACT AEPB Inhale 1 puff into the lungs 2 (two) times daily. 09/17/22   Willette Cluster, MD  nicotine (NICODERM CQ - DOSED IN MG/24 HOURS) 14 mg/24hr patch Place 1 patch (14 mg total) onto the skin daily. 09/17/22   Willette Cluster, MD  predniSONE (DELTASONE) 20 MG tablet Take 2 tablets (40 mg total) by mouth daily with breakfast for 2 days. 09/18/22 09/20/22  Willette Cluster, MD  Tiotropium Bromide Monohydrate (SPIRIVA RESPIMAT) 1.25 MCG/ACT AERS Inhale 2 puffs into the lungs daily. 09/17/22 10/17/22  Willette Cluster, MD      Allergies    Patient has no known allergies.    Review of Systems   Review of Systems  Constitutional:  Negative for fever.  Respiratory:  Positive for sputum production and shortness of breath.   Gastrointestinal:  Negative for abdominal pain.  All other systems reviewed and are negative.   Physical  Exam Updated Vital Signs BP 133/79   Pulse 95   Temp 98.4 F (36.9 C) (Oral)   Resp 20   Ht 5\' 8"  (1.727 m)   Wt 66.7 kg   SpO2 97%   BMI 22.35 kg/m  Physical Exam Vitals and nursing note reviewed.  Constitutional:      Appearance: He is well-developed.  HENT:     Head: Normocephalic.  Cardiovascular:     Rate and Rhythm: Normal rate and regular rhythm.  Pulmonary:     Effort: Pulmonary effort is normal.     Breath sounds: Examination of the right-upper field reveals rhonchi. Examination of the left-upper field reveals rhonchi. Examination of the right-middle field reveals rhonchi. Examination of the left-middle field reveals rhonchi. Examination of the right-lower field reveals rhonchi. Examination of the left-lower field reveals rhonchi. Rhonchi present. No decreased breath sounds.  Abdominal:     General: There is no distension.  Musculoskeletal:        General: Normal range of motion.     Cervical back: Normal range of motion.  Skin:    General: Skin is warm.  Neurological:     Mental Status: He is alert and oriented to person, place, and time.  Psychiatric:        Mood and Affect: Mood normal.     ED Results / Procedures / Treatments   Labs (all labs ordered are listed, but only abnormal results are  displayed) Labs Reviewed  BASIC METABOLIC PANEL - Abnormal; Notable for the following components:      Result Value   Sodium 133 (*)    Glucose, Bld 107 (*)    All other components within normal limits  CBC - Abnormal; Notable for the following components:   RBC 3.05 (*)    Hemoglobin 9.9 (*)    HCT 30.6 (*)    MCV 100.3 (*)    Platelets 608 (*)    All other components within normal limits  TROPONIN I (HIGH SENSITIVITY)  TROPONIN I (HIGH SENSITIVITY)    EKG None  Radiology DG Chest 2 View  Result Date: 09/20/2022 CLINICAL DATA:  Shortness of breath and cough for 6 days EXAM: CHEST - 2 VIEW COMPARISON:  09/14/2022 and prior studies FINDINGS: The  cardiomediastinal silhouette is unremarkable. There is no evidence of focal airspace disease, pulmonary edema, suspicious pulmonary nodule/mass, pleural effusion, or pneumothorax. No acute bony abnormalities are identified. IMPRESSION: No active cardiopulmonary disease. Electronically Signed   By: Harmon Pier M.D.   On: 09/20/2022 16:33    Procedures Procedures    Medications Ordered in ED Medications  benzonatate (TESSALON) capsule 200 mg (200 mg Oral Given 09/20/22 1830)    ED Course/ Medical Decision Making/ A&P                             Medical Decision Making Pt complains of a persistent cough.  Pt was discharged on 5/15 after treatment.   Amount and/or Complexity of Data Reviewed External Data Reviewed: notes.    Details: Hospital medicine notes reviewed  Labs: ordered.    Details: Troponin is negative x 2 Radiology: ordered.    Details: Chest xray  no pneumonia ECG/medicine tests: ordered and independent interpretation performed.  Risk Prescription drug management. Risk Details: Pt given solumedrol 125mg  IM.  Pt given albuterol inhaler here.  Pt advised to follow up with primary care for recheck            Final Clinical Impression(s) / ED Diagnoses Final diagnoses:  Acute cough  COPD exacerbation (HCC)    Rx / DC Orders ED Discharge Orders     None      An After Visit Summary was printed and given to the patient.    Osie Cheeks 09/20/22 2028    Rondel Baton, MD 09/22/22 [phone]

## 2022-09-20 NOTE — ED Triage Notes (Addendum)
Pt arrives with c/o SOB and cough that has been going on for more than a week. Per pt, he was treated for PNA recently and he still feels the same as before. Pt endorse ribcage pain. Pt denies fevers. Pt has hx of COPD. Per pt, he did not get prescriptions from pharmacy after he was discharged.

## 2022-09-22 ENCOUNTER — Other Ambulatory Visit (HOSPITAL_COMMUNITY): Payer: Self-pay

## 2022-09-22 ENCOUNTER — Encounter (HOSPITAL_COMMUNITY): Payer: Self-pay | Admitting: *Deleted

## 2022-09-22 ENCOUNTER — Ambulatory Visit (HOSPITAL_COMMUNITY)
Admission: EM | Admit: 2022-09-22 | Discharge: 2022-09-22 | Disposition: A | Discharge status: Home / Self Care | Payer: Medicaid Other | Attending: Internal Medicine | Admitting: Internal Medicine

## 2022-09-22 DIAGNOSIS — R052 Subacute cough: Secondary | ICD-10-CM | POA: Diagnosis not present

## 2022-09-22 MED ORDER — BENZONATATE 200 MG PO CAPS
200.0000 mg | ORAL_CAPSULE | Freq: Three times a day (TID) | ORAL | 0 refills | Status: DC | PRN
  Filled 2022-09-22: qty 30, 10d supply, fill #0

## 2022-09-22 NOTE — Discharge Instructions (Addendum)
Chest x-ray from 2 days ago is clear.  You do not have pneumonia at this time.  The coughing is going to improve as your lung recovers from the previous infection.  Please take the cough medications as prescribed and you should see some improvement over the next few to several days.

## 2022-09-22 NOTE — ED Triage Notes (Signed)
Pt reports he was seen in ED on 09-20-22 and treated for PNA. Pt reports he wants a RX for PNA. Pt reports he feels worse than he did on 2-18.
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
## 2022-10-06 ENCOUNTER — Inpatient Hospital Stay: Payer: Medicaid Other | Admitting: Family Medicine

## 2022-11-16 ENCOUNTER — Other Ambulatory Visit (HOSPITAL_COMMUNITY)
Admission: EM | Admit: 2022-11-16 | Discharge: 2022-11-17 | Disposition: A | Discharge status: Other Acute Inpatient Hospital | Payer: MEDICAID | Source: Home / Self Care | Admitting: Nurse Practitioner

## 2022-11-16 DIAGNOSIS — F1024 Alcohol dependence with alcohol-induced mood disorder: Secondary | ICD-10-CM | POA: Insufficient documentation

## 2022-11-16 DIAGNOSIS — F10220 Alcohol dependence with intoxication, uncomplicated: Secondary | ICD-10-CM | POA: Diagnosis present

## 2022-11-16 DIAGNOSIS — Y908 Blood alcohol level of 240 mg/100 ml or more: Secondary | ICD-10-CM | POA: Diagnosis not present

## 2022-11-16 DIAGNOSIS — F10229 Alcohol dependence with intoxication, unspecified: Secondary | ICD-10-CM | POA: Insufficient documentation

## 2022-11-16 DIAGNOSIS — F419 Anxiety disorder, unspecified: Secondary | ICD-10-CM | POA: Diagnosis not present

## 2022-11-16 DIAGNOSIS — F10239 Alcohol dependence with withdrawal, unspecified: Secondary | ICD-10-CM | POA: Diagnosis not present

## 2022-11-16 DIAGNOSIS — R6883 Chills (without fever): Secondary | ICD-10-CM | POA: Diagnosis not present

## 2022-11-16 DIAGNOSIS — Z560 Unemployment, unspecified: Secondary | ICD-10-CM | POA: Diagnosis not present

## 2022-11-16 DIAGNOSIS — Z7951 Long term (current) use of inhaled steroids: Secondary | ICD-10-CM | POA: Diagnosis not present

## 2022-11-16 DIAGNOSIS — J449 Chronic obstructive pulmonary disease, unspecified: Secondary | ICD-10-CM | POA: Diagnosis not present

## 2022-11-16 DIAGNOSIS — R45851 Suicidal ideations: Secondary | ICD-10-CM | POA: Insufficient documentation

## 2022-11-16 DIAGNOSIS — Z5900 Homelessness unspecified: Secondary | ICD-10-CM | POA: Insufficient documentation

## 2022-11-16 DIAGNOSIS — F1090 Alcohol use, unspecified, uncomplicated: Secondary | ICD-10-CM | POA: Diagnosis present

## 2022-11-16 DIAGNOSIS — Z5901 Sheltered homelessness: Secondary | ICD-10-CM | POA: Diagnosis not present

## 2022-11-16 LAB — LIPID PANEL
Cholesterol: 209 mg/dL — ABNORMAL HIGH (ref 0–200)
HDL: 55 mg/dL (ref >40)
LDL Cholesterol: 127 mg/dL — ABNORMAL HIGH (ref 0–99)
Total CHOL/HDL Ratio: 3.8 RATIO
Triglycerides: 134 mg/dL (ref <150)
VLDL: 27 mg/dL (ref 0–40)

## 2022-11-16 LAB — CBC WITH DIFFERENTIAL/PLATELET
Abs Immature Granulocytes: 0.01 10*3/uL (ref 0.00–0.07)
Basophils Absolute: 0.1 10*3/uL (ref 0.0–0.1)
Basophils Relative: 2 %
Eosinophils Absolute: 0.8 10*3/uL — ABNORMAL HIGH (ref 0.0–0.5)
Eosinophils Relative: 13 %
HCT: 35.6 % — ABNORMAL LOW (ref 39.0–52.0)
Hemoglobin: 11.8 g/dL — ABNORMAL LOW (ref 13.0–17.0)
Immature Granulocytes: 0 %
Lymphocytes Relative: 37 %
Lymphs Abs: 2.2 10*3/uL (ref 0.7–4.0)
MCH: 30.5 pg (ref 26.0–34.0)
MCHC: 33.1 g/dL (ref 30.0–36.0)
MCV: 92 fL (ref 80.0–100.0)
Monocytes Absolute: 0.3 10*3/uL (ref 0.1–1.0)
Monocytes Relative: 5 %
Neutro Abs: 2.6 10*3/uL (ref 1.7–7.7)
Neutrophils Relative %: 43 %
Platelets: 325 10*3/uL (ref 150–400)
RBC: 3.87 MIL/uL — ABNORMAL LOW (ref 4.22–5.81)
RDW: 12.4 % (ref 11.5–15.5)
WBC: 6 10*3/uL (ref 4.0–10.5)
nRBC: 0 % (ref 0.0–0.2)

## 2022-11-16 LAB — COMPREHENSIVE METABOLIC PANEL
ALT: 27 U/L (ref 0–44)
AST: 32 U/L (ref 15–41)
Albumin: 3.8 g/dL (ref 3.5–5.0)
Alkaline Phosphatase: 79 U/L (ref 38–126)
Anion gap: 11 (ref 5–15)
BUN: 8 mg/dL (ref 6–20)
CO2: 23 mmol/L (ref 22–32)
Calcium: 9.5 mg/dL (ref 8.9–10.3)
Chloride: 107 mmol/L (ref 98–111)
Creatinine, Ser: 0.8 mg/dL (ref 0.61–1.24)
GFR, Estimated: >60 mL/min (ref >60)
Glucose, Bld: 96 mg/dL (ref 70–99)
Potassium: 4.1 mmol/L (ref 3.5–5.1)
Sodium: 141 mmol/L (ref 135–145)
Total Bilirubin: 0.4 mg/dL (ref 0.3–1.2)
Total Protein: 6.9 g/dL (ref 6.5–8.1)

## 2022-11-16 LAB — POCT URINE DRUG SCREEN - MANUAL ENTRY (I-SCREEN)
POC Amphetamine UR: NOT DETECTED
POC Buprenorphine (BUP): NOT DETECTED
POC Cocaine UR: NOT DETECTED
POC Marijuana UR: NOT DETECTED
POC Methadone UR: NOT DETECTED
POC Methamphetamine UR: NOT DETECTED
POC Morphine: NOT DETECTED
POC Oxazepam (BZO): POSITIVE — AB
POC Oxycodone UR: NOT DETECTED
POC Secobarbital (BAR): NOT DETECTED

## 2022-11-16 LAB — ETHANOL: Alcohol, Ethyl (B): 350 mg/dL (ref <10)

## 2022-11-16 MED ORDER — TRAZODONE HCL 50 MG PO TABS: 50.0000 mg | ORAL_TABLET | Freq: Every evening | ORAL | Status: DC | PRN

## 2022-11-16 MED ORDER — ADULT MULTIVITAMIN W/MINERALS CH: 1.0000 | ORAL_TABLET | Freq: Every day | ORAL | Status: DC

## 2022-11-16 MED ORDER — THIAMINE MONONITRATE 100 MG PO TABS: 100.0000 mg | ORAL_TABLET | Freq: Every day | ORAL | Status: DC

## 2022-11-16 MED ORDER — MAGNESIUM HYDROXIDE 400 MG/5ML PO SUSP: 30.0000 mL | Freq: Every day | ORAL | Status: DC | PRN

## 2022-11-16 MED ORDER — THIAMINE HCL 100 MG/ML IJ SOLN
100.0000 mg | Freq: Once | INTRAMUSCULAR | Status: AC
  Administered 2022-11-16: 100 mg via INTRAMUSCULAR
  Filled 2022-11-16: qty 2

## 2022-11-16 MED ORDER — LORAZEPAM 1 MG PO TABS: 1.0000 mg | ORAL_TABLET | Freq: Two times a day (BID) | ORAL | Status: DC

## 2022-11-16 MED ORDER — HYDROXYZINE HCL 25 MG PO TABS: 25.0000 mg | ORAL_TABLET | Freq: Four times a day (QID) | ORAL | Status: DC | PRN

## 2022-11-16 MED ORDER — ONDANSETRON 4 MG PO TBDP: 4.0000 mg | ORAL_TABLET | Freq: Four times a day (QID) | ORAL | Status: DC | PRN

## 2022-11-16 MED ORDER — LOPERAMIDE HCL 2 MG PO CAPS: 2.0000 mg | ORAL_CAPSULE | ORAL | Status: DC | PRN

## 2022-11-16 MED ORDER — LORAZEPAM 1 MG PO TABS
1.0000 mg | ORAL_TABLET | Freq: Four times a day (QID) | ORAL | Status: DC
  Administered 2022-11-16: 1 mg via ORAL
  Filled 2022-11-16: qty 1

## 2022-11-16 MED ORDER — LORAZEPAM 1 MG PO TABS: 1.0000 mg | ORAL_TABLET | Freq: Four times a day (QID) | ORAL | Status: DC | PRN

## 2022-11-16 MED ORDER — ALUM & MAG HYDROXIDE-SIMETH 200-200-20 MG/5ML PO SUSP: 30.0000 mL | ORAL | Status: DC | PRN

## 2022-11-16 MED ORDER — LORAZEPAM 1 MG PO TABS: 1.0000 mg | ORAL_TABLET | Freq: Every day | ORAL | Status: DC

## 2022-11-16 MED ORDER — ACETAMINOPHEN 325 MG PO TABS: 650.0000 mg | ORAL_TABLET | Freq: Four times a day (QID) | ORAL | Status: DC | PRN

## 2022-11-16 MED ORDER — LORAZEPAM 1 MG PO TABS: 1.0000 mg | ORAL_TABLET | Freq: Three times a day (TID) | ORAL | Status: DC

## 2022-11-16 NOTE — ED Notes (Signed)
[facility] lab courier called to transport labs to [facility] lab 

## 2022-11-16 NOTE — ED Notes (Signed)
Lab called to notify staff of critical lab.  BAL 350.  Provider notified

## 2022-11-16 NOTE — ED Provider Notes (Incomplete)
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 (2021)   . Anion gap 08/31/2022 11  5 - 15 Final   Performed at [facility], [address] [place], [place] [address]  . Alcohol, Ethyl (B) 08/31/2022 396 (HH)  <10 mg/dL Final   Comment: CRITICAL RESULT CALLED TO, READ BACK BY AND VERIFIED WITH C,CARTER RN @1714  08/31/22 E,BENTON (NOTE) Lowest detectable limit for serum alcohol is 10 mg/dL.  For medical purposes only. Performed at [facility], [address],  [place], [place] [address]   . WBC 08/31/2022 7.3  4.0 - 10.5 K/uL Final  . RBC 08/31/2022 3.40 (L)  4.22 - 5.81 MIL/uL Final  . Hemoglobin 08/31/2022 11.3 (L)  13.0 - 17.0 g/dL Final  . HCT 30/16/0109 32.2 (L)  39.0 - 52.0 % Final  . MCV 08/31/2022 94.7  80.0 - 100.0 fL Final  . MCH 08/31/2022 33.2  26.0 - 34.0 pg Final  . MCHC 08/31/2022 35.1  30.0 - 36.0 g/dL Final  . RDW 32/35/5732 13.5  11.5 - 15.5 % Final  . Platelets 08/31/2022 295  150 - 400 K/uL Final  . nRBC 08/31/2022 0.0  0.0 - 0.2 % Final   Performed at [facility], [address] [place], [place] [address]    Allergies: Patient has no known allergies.  Medications:  Facility Ordered Medications  Medication  . acetaminophen (TYLENOL) tablet 650 mg  . alum & mag hydroxide-simeth (MAALOX/MYLANTA) 200-200-20 MG/5ML suspension 30 mL  . magnesium hydroxide (MILK OF MAGNESIA) suspension 30 mL  . traZODone (DESYREL) tablet 50 mg  . [COMPLETED] thiamine (VITAMIN B1) injection 100 mg  . [START ON 11/17/2022] thiamine (VITAMIN B1) tablet 100 mg  . [START ON 11/17/2022] multivitamin with minerals tablet 1 tablet  . LORazepam (ATIVAN) tablet 1 mg  . hydrOXYzine (ATARAX) tablet 25 mg  . loperamide (IMODIUM) capsule 2-4 mg  . ondansetron (ZOFRAN-ODT) disintegrating tablet 4 mg  . LORazepam (ATIVAN) tablet 1 mg   Followed by  . [START ON 11/18/2022] LORazepam (ATIVAN) tablet 1 mg   Followed by  . [START ON 11/19/2022] LORazepam (ATIVAN) tablet 1 mg   Followed by  . [START ON 11/21/2022] LORazepam (ATIVAN) tablet 1 mg   PTA Medications  Medication Sig  . nicotine (NICODERM CQ - DOSED IN MG/24 HOURS) 14 mg/24hr patch Place 1 patch (14 mg total) onto the skin daily.  . Tiotropium Bromide Monohydrate (SPIRIVA RESPIMAT) 1.25 MCG/ACT AERS Inhale 2 puffs into the lungs daily.  . fluticasone-salmeterol (ADVAIR DISKUS) 100-50 MCG/ACT AEPB Inhale 1 puff into the lungs 2 (two) times daily.  . benzonatate (TESSALON) 200 MG capsule Take 1  capsule (200 mg total) by mouth 3 (three) times daily as needed for cough.      Medical Decision Making  Zien Vasek is a 52 y/o male presenting voluntarily to [facility] via [employer] for alcohol intoxication and suicidal ideation.     Recommendations  Based on my evaluation the patient does not appear to have an emergency medical condition. Patient recommended for [facility] there is not a bed available and will be admitted to  [facility].  Jasper Riling, NP 11/16/22  11:54 PM

## 2022-11-16 NOTE — ED Provider Notes (Signed)
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 Final   Comment: (NOTE) Calculated using the CKD-EPI Creatinine Equation (2021)    Anion gap 08/31/2022 11  5 - 15 Final   Performed at [facility], [address], [place], [place] [address]   Alcohol, Ethyl (B) 08/31/2022 396 (HH)  <10 mg/dL Final   Comment: CRITICAL RESULT CALLED TO, READ BACK BY AND VERIFIED WITH C,CARTER RN @1714  08/31/22 E,BENTON (NOTE) Lowest detectable limit for serum alcohol is 10 mg/dL.  For medical purposes  only. Performed at [facility], [address] [place], [place] [address]    WBC 08/31/2022 7.3  4.0 - 10.5 K/uL Final   RBC 08/31/2022 3.40 (L)  4.22 - 5.81 MIL/uL Final   Hemoglobin 08/31/2022 11.3 (L)  13.0 - 17.0 g/dL Final   HCT 46/96/2952 32.2 (L)  39.0 - 52.0 % Final   MCV 08/31/2022 94.7  80.0 - 100.0 fL Final   MCH 08/31/2022 33.2  26.0 - 34.0 pg Final   MCHC 08/31/2022 35.1  30.0 - 36.0 g/dL Final   RDW 84/13/2440 13.5  11.5 - 15.5 % Final   Platelets 08/31/2022 295  150 - 400 K/uL Final   nRBC 08/31/2022 0.0  0.0 - 0.2 % Final   Performed at [facility], [address] [place], [place] [address]    Allergies: Patient has no known allergies.  Medications:  Facility Ordered Medications  Medication   acetaminophen (TYLENOL) tablet 650 mg   alum & mag hydroxide-simeth (MAALOX/MYLANTA) 200-200-20 MG/5ML suspension 30 mL   magnesium hydroxide (MILK OF MAGNESIA) suspension 30 mL   traZODone (DESYREL) tablet 50 mg   [COMPLETED] thiamine (VITAMIN B1) injection 100 mg   [START ON 11/17/2022] thiamine (VITAMIN B1) tablet 100 mg   [START ON 11/17/2022] multivitamin with minerals tablet 1 tablet   LORazepam (ATIVAN) tablet 1 mg   hydrOXYzine (ATARAX) tablet 25 mg   loperamide (IMODIUM) capsule 2-4 mg   ondansetron (ZOFRAN-ODT) disintegrating tablet 4 mg   LORazepam (ATIVAN) tablet 1 mg   Followed by   Melene Muller ON 11/18/2022] LORazepam (ATIVAN) tablet 1 mg   Followed by   Melene Muller ON 11/19/2022] LORazepam (ATIVAN) tablet 1 mg   Followed by   Melene Muller ON 11/21/2022] LORazepam (ATIVAN) tablet 1 mg   PTA Medications  Medication Sig   nicotine (NICODERM CQ - DOSED IN MG/24 HOURS) 14 mg/24hr patch Place 1 patch (14 mg total) onto the skin daily.   Tiotropium Bromide Monohydrate (SPIRIVA RESPIMAT) 1.25 MCG/ACT AERS Inhale 2 puffs into the lungs daily.   fluticasone-salmeterol (ADVAIR DISKUS) 100-50 MCG/ACT AEPB Inhale 1 puff into the lungs 2 (two) times daily.   benzonatate  (TESSALON) 200 MG capsule Take 1 capsule (200 mg total) by mouth 3 (three) times daily as needed for cough.      Medical Decision Making  Margie Germany is a 52 y/o male homeless presenting voluntarily to [facility] via [employer] for alcohol intoxication and suicidal ideation. Patient wants to  detox from alcohol.     Recommendations  Based on my evaluation the patient does not appear to have an emergency medical condition. Patient recommended for [facility], but there is not a bed available and will be admitted to [facility] continuous assessment for crisis management, stabilization and safety.   Jasper Riling, NP 11/16/22  11:54 PM
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
## 2022-11-16 NOTE — Progress Notes (Signed)
   11/16/22 2057  [facility]UC Triage Screening (Walk-ins at [facility] only)  How Did You Hear About [place]? Legal System  What Is the Reason for Your Visit/Call Today? Pt presents to [facility] voluntarily by [employer]. [employer] rpeorts reciving a phone call from friend stating pt was making suicidal statements. Pt reports struggling with alcohol and consuming 240oz of beer daily. Pt does endorse SI with a plan. Pt denies HI and AVH at this time. Pt reports consuming alcohol approximately 45 minutes ago. Pt appears intoxicated. Pt is urgent.  How Long Has This Been Causing You Problems? <Week  Have You Recently Had Any Thoughts About Hurting Yourself? Yes  How long ago did you have thoughts about hurting yourself? Today  Are You Planning to Commit Suicide/Harm Yourself At This time? No  Have you Recently Had Thoughts About Hurting Someone [name]? No  Are You Planning To Harm Someone At This Time? No  Are you currently experiencing any auditory, visual or other hallucinations? No  Have You Used Any Alcohol or Drugs in the Past 24 Hours? Yes  How long ago did you use Drugs or Alcohol? 45 minutes ago/ alcohol  What Did You Use and How Much? unknown  Do you have any current medical co-morbidities that require immediate attention? No  Clinician description of patient physical appearance/behavior: Pt is fairly goomed and somewhat cooperative. Pt appears intoxicated during triage.  What Do You Feel Would Help You the Most Today? Treatment for Depression or other mood problem;Alcohol or Drug Use Treatment  If access to [facility] was not available, would you have sought care in the Emergency Department? Yes  Determination of Need Urgent (48 hours)  Options For Referral [facility]

## 2022-11-16 NOTE — ED Notes (Signed)
Patient has been brought on unit, familiarized with unit, food and medication given.Marland Kitchen

## 2022-11-16 NOTE — ED Triage Notes (Signed)
Pt presents to [facility] voluntarily by [employer]. [employer] rpeorts reciving a phone call from friend stating pt was making suicidal statements. Pt reports struggling with alcohol and consuming 240oz of beer daily. Pt does endorse SI with a plan. Pt denies HI and AVH at this time. Pt reports consuming alcohol approximately 45 minutes ago. Pt appears intoxicated. Pt is urgent.

## 2022-11-16 NOTE — ED Notes (Signed)
Pt is currently sleeping, no distress noted, environmental check complete, will continue to monitor patient for safety.  

## 2022-11-17 ENCOUNTER — Other Ambulatory Visit: Payer: Self-pay

## 2022-11-17 ENCOUNTER — Emergency Department (HOSPITAL_COMMUNITY)
Admission: EM | Admit: 2022-11-17 | Discharge: 2022-11-17 | Disposition: A | Discharge status: Other Acute Inpatient Hospital | Payer: MEDICAID | Attending: Emergency Medicine | Admitting: Emergency Medicine

## 2022-11-17 ENCOUNTER — Ambulatory Visit (HOSPITAL_COMMUNITY)
Admission: EM | Admit: 2022-11-17 | Discharge: 2022-11-17 | Disposition: A | Discharge status: Other Acute Inpatient Hospital | Payer: MEDICAID | Source: Home / Self Care

## 2022-11-17 ENCOUNTER — Other Ambulatory Visit (HOSPITAL_COMMUNITY)
Admission: EM | Admit: 2022-11-17 | Discharge: 2022-11-21 | Disposition: A | Discharge status: Home / Self Care | Payer: MEDICAID | Attending: Psychiatry | Admitting: Psychiatry

## 2022-11-17 ENCOUNTER — Encounter (HOSPITAL_COMMUNITY): Payer: Self-pay | Admitting: Emergency Medicine

## 2022-11-17 DIAGNOSIS — F419 Anxiety disorder, unspecified: Secondary | ICD-10-CM | POA: Insufficient documentation

## 2022-11-17 DIAGNOSIS — Z560 Unemployment, unspecified: Secondary | ICD-10-CM | POA: Insufficient documentation

## 2022-11-17 DIAGNOSIS — Z5901 Sheltered homelessness: Secondary | ICD-10-CM | POA: Insufficient documentation

## 2022-11-17 DIAGNOSIS — F10239 Alcohol dependence with withdrawal, unspecified: Secondary | ICD-10-CM | POA: Insufficient documentation

## 2022-11-17 DIAGNOSIS — F10139 Alcohol abuse with withdrawal, unspecified: Secondary | ICD-10-CM | POA: Insufficient documentation

## 2022-11-17 DIAGNOSIS — F10920 Alcohol use, unspecified with intoxication, uncomplicated: Secondary | ICD-10-CM

## 2022-11-17 DIAGNOSIS — R6883 Chills (without fever): Secondary | ICD-10-CM | POA: Insufficient documentation

## 2022-11-17 DIAGNOSIS — R45851 Suicidal ideations: Secondary | ICD-10-CM | POA: Insufficient documentation

## 2022-11-17 DIAGNOSIS — Y908 Blood alcohol level of 240 mg/100 ml or more: Secondary | ICD-10-CM | POA: Insufficient documentation

## 2022-11-17 DIAGNOSIS — Z7951 Long term (current) use of inhaled steroids: Secondary | ICD-10-CM | POA: Insufficient documentation

## 2022-11-17 DIAGNOSIS — F10220 Alcohol dependence with intoxication, uncomplicated: Secondary | ICD-10-CM | POA: Insufficient documentation

## 2022-11-17 DIAGNOSIS — F1010 Alcohol abuse, uncomplicated: Secondary | ICD-10-CM | POA: Insufficient documentation

## 2022-11-17 DIAGNOSIS — F1024 Alcohol dependence with alcohol-induced mood disorder: Secondary | ICD-10-CM | POA: Insufficient documentation

## 2022-11-17 DIAGNOSIS — J449 Chronic obstructive pulmonary disease, unspecified: Secondary | ICD-10-CM | POA: Insufficient documentation

## 2022-11-17 DIAGNOSIS — F10229 Alcohol dependence with intoxication, unspecified: Secondary | ICD-10-CM | POA: Insufficient documentation

## 2022-11-17 LAB — COMPREHENSIVE METABOLIC PANEL
ALT: 25 U/L (ref 0–44)
AST: 32 U/L (ref 15–41)
Albumin: 3.4 g/dL — ABNORMAL LOW (ref 3.5–5.0)
Alkaline Phosphatase: 74 U/L (ref 38–126)
Anion gap: 12 (ref 5–15)
BUN: 8 mg/dL (ref 6–20)
CO2: 22 mmol/L (ref 22–32)
Calcium: 9.1 mg/dL (ref 8.9–10.3)
Chloride: 107 mmol/L (ref 98–111)
Creatinine, Ser: 0.78 mg/dL (ref 0.61–1.24)
GFR, Estimated: >60 mL/min (ref >60)
Glucose, Bld: 128 mg/dL — ABNORMAL HIGH (ref 70–99)
Potassium: 3.5 mmol/L (ref 3.5–5.1)
Sodium: 141 mmol/L (ref 135–145)
Total Bilirubin: 0.3 mg/dL (ref 0.3–1.2)
Total Protein: 6.7 g/dL (ref 6.5–8.1)

## 2022-11-17 LAB — CBC WITH DIFFERENTIAL/PLATELET
Abs Immature Granulocytes: 0.01 10*3/uL (ref 0.00–0.07)
Basophils Absolute: 0.1 10*3/uL (ref 0.0–0.1)
Basophils Relative: 2 %
Eosinophils Absolute: 0.8 10*3/uL — ABNORMAL HIGH (ref 0.0–0.5)
Eosinophils Relative: 15 %
HCT: 34.8 % — ABNORMAL LOW (ref 39.0–52.0)
Hemoglobin: 11.5 g/dL — ABNORMAL LOW (ref 13.0–17.0)
Immature Granulocytes: 0 %
Lymphocytes Relative: 37 %
Lymphs Abs: 2.1 10*3/uL (ref 0.7–4.0)
MCH: 31 pg (ref 26.0–34.0)
MCHC: 33 g/dL (ref 30.0–36.0)
MCV: 93.8 fL (ref 80.0–100.0)
Monocytes Absolute: 0.3 10*3/uL (ref 0.1–1.0)
Monocytes Relative: 5 %
Neutro Abs: 2.3 10*3/uL (ref 1.7–7.7)
Neutrophils Relative %: 41 %
Platelets: 290 10*3/uL (ref 150–400)
RBC: 3.71 MIL/uL — ABNORMAL LOW (ref 4.22–5.81)
RDW: 12.1 % (ref 11.5–15.5)
WBC: 5.6 10*3/uL (ref 4.0–10.5)
nRBC: 0 % (ref 0.0–0.2)

## 2022-11-17 LAB — ETHANOL: Alcohol, Ethyl (B): 251 mg/dL — ABNORMAL HIGH (ref <10)

## 2022-11-17 MED ORDER — ALUM & MAG HYDROXIDE-SIMETH 200-200-20 MG/5ML PO SUSP: 30.0000 mL | ORAL | Status: DC | PRN

## 2022-11-17 MED ORDER — LORAZEPAM 1 MG PO TABS
1.0000 mg | ORAL_TABLET | Freq: Four times a day (QID) | ORAL | Status: AC
  Administered 2022-11-17 – 2022-11-18 (×4): 1 mg via ORAL
  Filled 2022-11-17 (×4): qty 1

## 2022-11-17 MED ORDER — HYDROXYZINE HCL 25 MG PO TABS: 25.0000 mg | ORAL_TABLET | Freq: Four times a day (QID) | ORAL | Status: DC | PRN

## 2022-11-17 MED ORDER — ACETAMINOPHEN 325 MG PO TABS: 650.0000 mg | ORAL_TABLET | Freq: Four times a day (QID) | ORAL | Status: DC | PRN

## 2022-11-17 MED ORDER — MAGNESIUM HYDROXIDE 400 MG/5ML PO SUSP: 30.0000 mL | Freq: Every day | ORAL | Status: DC | PRN

## 2022-11-17 MED ORDER — THIAMINE MONONITRATE 100 MG PO TABS: 100.0000 mg | ORAL_TABLET | Freq: Every day | ORAL | Status: DC

## 2022-11-17 MED ORDER — LORAZEPAM 1 MG PO TABS: 1.0000 mg | ORAL_TABLET | Freq: Every day | ORAL | Status: DC

## 2022-11-17 MED ORDER — LORAZEPAM 1 MG PO TABS
1.0000 mg | ORAL_TABLET | Freq: Four times a day (QID) | ORAL | Status: DC
  Administered 2022-11-17 (×2): 1 mg via ORAL
  Filled 2022-11-17 (×2): qty 1

## 2022-11-17 MED ORDER — TRAZODONE HCL 50 MG PO TABS: 50.0000 mg | ORAL_TABLET | Freq: Every evening | ORAL | Status: DC | PRN

## 2022-11-17 MED ORDER — THIAMINE MONONITRATE 100 MG PO TABS
100.0000 mg | ORAL_TABLET | Freq: Every day | ORAL | Status: DC
  Administered 2022-11-18 – 2022-11-21 (×4): 100 mg via ORAL
  Filled 2022-11-17 (×4): qty 1

## 2022-11-17 MED ORDER — ACETAMINOPHEN 325 MG PO TABS
650.0000 mg | ORAL_TABLET | Freq: Four times a day (QID) | ORAL | Status: DC | PRN
  Administered 2022-11-19: 650 mg via ORAL
  Filled 2022-11-17: qty 2

## 2022-11-17 MED ORDER — LOPERAMIDE HCL 2 MG PO CAPS: 2.0000 mg | ORAL_CAPSULE | ORAL | Status: DC | PRN

## 2022-11-17 MED ORDER — LORAZEPAM 1 MG PO TABS
1.0000 mg | ORAL_TABLET | Freq: Two times a day (BID) | ORAL | Status: DC
  Administered 2022-11-19: 1 mg via ORAL
  Filled 2022-11-17 (×2): qty 1

## 2022-11-17 MED ORDER — LORAZEPAM 1 MG PO TABS: 1.0000 mg | ORAL_TABLET | Freq: Three times a day (TID) | ORAL | Status: DC

## 2022-11-17 MED ORDER — LORAZEPAM 1 MG PO TABS: 1.0000 mg | ORAL_TABLET | Freq: Two times a day (BID) | ORAL | Status: DC

## 2022-11-17 MED ORDER — ONDANSETRON 4 MG PO TBDP: 4.0000 mg | ORAL_TABLET | Freq: Four times a day (QID) | ORAL | Status: DC | PRN

## 2022-11-17 MED ORDER — HYDROXYZINE HCL 25 MG PO TABS
25.0000 mg | ORAL_TABLET | Freq: Four times a day (QID) | ORAL | Status: AC | PRN
  Administered 2022-11-19: 25 mg via ORAL
  Filled 2022-11-17: qty 1

## 2022-11-17 MED ORDER — LORAZEPAM 1 MG PO TABS: 1.0000 mg | ORAL_TABLET | Freq: Four times a day (QID) | ORAL | Status: AC | PRN

## 2022-11-17 MED ORDER — LOPERAMIDE HCL 2 MG PO CAPS: 2.0000 mg | ORAL_CAPSULE | ORAL | Status: AC | PRN

## 2022-11-17 MED ORDER — SODIUM CHLORIDE 0.9 % IV BOLUS
1000.0000 mL | Freq: Once | INTRAVENOUS | Status: AC
  Administered 2022-11-17: 1000 mL via INTRAVENOUS

## 2022-11-17 MED ORDER — ADULT MULTIVITAMIN W/MINERALS CH
1.0000 | ORAL_TABLET | Freq: Every day | ORAL | Status: DC
  Administered 2022-11-18 – 2022-11-21 (×4): 1 via ORAL
  Filled 2022-11-17 (×4): qty 1

## 2022-11-17 MED ORDER — TRAZODONE HCL 50 MG PO TABS
50.0000 mg | ORAL_TABLET | Freq: Every evening | ORAL | Status: DC | PRN
  Administered 2022-11-17 – 2022-11-19 (×3): 50 mg via ORAL
  Filled 2022-11-17 (×3): qty 1

## 2022-11-17 MED ORDER — LORAZEPAM 1 MG PO TABS: 1.0000 mg | ORAL_TABLET | Freq: Four times a day (QID) | ORAL | Status: DC | PRN

## 2022-11-17 MED ORDER — ADULT MULTIVITAMIN W/MINERALS CH
1.0000 | ORAL_TABLET | Freq: Every day | ORAL | Status: DC
  Administered 2022-11-17: 1 via ORAL
  Filled 2022-11-17: qty 1

## 2022-11-17 MED ORDER — HYDROXYZINE HCL 25 MG PO TABS: 25.0000 mg | ORAL_TABLET | Freq: Three times a day (TID) | ORAL | Status: DC | PRN

## 2022-11-17 MED ORDER — THIAMINE HCL 100 MG/ML IJ SOLN: 100.0000 mg | Freq: Once | INTRAMUSCULAR | Status: DC

## 2022-11-17 MED ORDER — ONDANSETRON 4 MG PO TBDP: 4.0000 mg | ORAL_TABLET | Freq: Four times a day (QID) | ORAL | Status: AC | PRN

## 2022-11-17 MED ORDER — LORAZEPAM 1 MG PO TABS
1.0000 mg | ORAL_TABLET | Freq: Three times a day (TID) | ORAL | Status: AC
  Administered 2022-11-18 – 2022-11-19 (×3): 1 mg via ORAL
  Filled 2022-11-17 (×3): qty 1

## 2022-11-17 NOTE — ED Provider Notes (Signed)
FBC/OBS ASAP Discharge Summary  Date and Time: 11/17/2022 2:25 PM  Name: Gary Mclaughlin  MRN:  [id number]   Discharge Diagnoses:  Final diagnoses:  ETOH abuse  Suicidal ideations    Subjective: Patient okay this morning. Experiencing mild withdrawal symptoms chills, sweats, muscle aches, anxious and craves a beer. Denies diarrhea, abdominal pain, nausea or vomiting. Was drinking ~6 40 oz beers prior to admission.  Denies any current SI/HI/AVH. SI has been chronic intermittently with x3 attempts in teenage years. No active plan in place, "just sick and tired and something needs to change".    Would like a rehab facility however, is unable to go due to open cases for breaking and entering. Court date is August 13th and [facility]. Previous withdrawal symptoms include fevers, chills, tremors, anxiety, cold sweats and muscle aches. Denies history of seizures while withdrawing. Desires to detox and previously detoxed in the [facility] ED in 09/2022. Longest period of sobriety was 55 days ago and recent life stressors led him to start drinking again. Mantained sobriety by going to group sessions.   Stay Summary:   The patient was evaluated each day by a clinical provider to ascertain response to treatment. Improvement was noted by the patient's report of decreasing symptoms, improved sleep and appetite, affect, medication tolerance, behavior, and participation in unit programming.  Patient was asked each day to complete a self inventory noting mood, mental status, pain, new symptoms, anxiety and concerns.  Patient responded well to medication and being in a therapeutic and supportive environment. Positive and appropriate behavior was noted and the patient was motivated for recovery. The patient worked closely with the treatment team and case manager to develop a discharge plan with appropriate goals. Coping skills, problem solving as well as relaxation therapies were also part of the  unit programming.   Total Time spent with patient: 30 minutes  Past Psychiatric History: EtOH abuse Past Medical History: COPD, Subarachnoid Hemorrhage, tobacco abuse  Family History: N/a Family Psychiatric History: N/a Social History: Homeless since 2012 and lives in a tent. Unemployed, last job in [employer]. Has family a brother and son in [place]. Mom is still alive in [place]. No family support nearby.   Current Medications:  Current Facility-Administered Medications  Medication Dose Route Frequency Provider Last Rate Last Admin   acetaminophen (TYLENOL) tablet 650 mg  650 mg Oral Q6H PRN Bobbitt, Shalon E, NP       alum & mag hydroxide-simeth (MAALOX/MYLANTA) 200-200-20 MG/5ML suspension 30 mL  30 mL Oral Q4H PRN Bobbitt, Shalon E, NP       hydrOXYzine (ATARAX) tablet 25 mg  25 mg Oral TID PRN Bobbitt, Shalon E, NP       hydrOXYzine (ATARAX) tablet 25 mg  25 mg Oral Q6H PRN Bobbitt, Shalon E, NP       loperamide (IMODIUM) capsule 2-4 mg  2-4 mg Oral PRN Bobbitt, Shalon E, NP       LORazepam (ATIVAN) tablet 1 mg  1 mg Oral Q6H PRN Bobbitt, Shalon E, NP       LORazepam (ATIVAN) tablet 1 mg  1 mg Oral QID Bobbitt, Shalon E, NP   1 mg at 11/17/22 1404   Followed by   Melene Muller ON 11/18/2022] LORazepam (ATIVAN) tablet 1 mg  1 mg Oral TID Bobbitt, Shalon E, NP       Followed by   Melene Muller ON 11/19/2022] LORazepam (ATIVAN) tablet 1 mg  1 mg Oral BID Bobbitt,  Franchot Mimes, NP       Followed by   Melene Muller ON 11/21/2022] LORazepam (ATIVAN) tablet 1 mg  1 mg Oral Daily Bobbitt, Shalon E, NP       magnesium hydroxide (MILK OF MAGNESIA) suspension 30 mL  30 mL Oral Daily PRN Bobbitt, Shalon E, NP       multivitamin with minerals tablet 1 tablet  1 tablet Oral Daily Bobbitt, Shalon E, NP   1 tablet at 11/17/22 0921   ondansetron (ZOFRAN-ODT) disintegrating tablet 4 mg  4 mg Oral Q6H PRN Bobbitt, Shalon E, NP       [START ON 11/18/2022] thiamine (VITAMIN B1) tablet 100 mg  100 mg Oral Daily Bobbitt, Shalon E,  NP       traZODone (DESYREL) tablet 50 mg  50 mg Oral QHS PRN Bobbitt, Shalon E, NP       No current outpatient medications on file.    PTA Medications:  Facility Ordered Medications  Medication   [COMPLETED] sodium chloride 0.9 % bolus 1,000 mL   acetaminophen (TYLENOL) tablet 650 mg   alum & mag hydroxide-simeth (MAALOX/MYLANTA) 200-200-20 MG/5ML suspension 30 mL   magnesium hydroxide (MILK OF MAGNESIA) suspension 30 mL   traZODone (DESYREL) tablet 50 mg   hydrOXYzine (ATARAX) tablet 25 mg   [START ON 11/18/2022] thiamine (VITAMIN B1) tablet 100 mg   multivitamin with minerals tablet 1 tablet   LORazepam (ATIVAN) tablet 1 mg   hydrOXYzine (ATARAX) tablet 25 mg   loperamide (IMODIUM) capsule 2-4 mg   ondansetron (ZOFRAN-ODT) disintegrating tablet 4 mg   LORazepam (ATIVAN) tablet 1 mg   Followed by   Melene Muller ON 11/18/2022] LORazepam (ATIVAN) tablet 1 mg   Followed by   Melene Muller ON 11/19/2022] LORazepam (ATIVAN) tablet 1 mg   Followed by   Melene Muller ON 11/21/2022] LORazepam (ATIVAN) tablet 1 mg       05/02/2021    4:40 PM  Depression screen PHQ 2/9  Decreased Interest 1  Down, Depressed, Hopeless 1  PHQ - 2 Score 2  Altered sleeping 0  Tired, decreased energy 0  Change in appetite 0  Feeling bad or failure about yourself  2  Trouble concentrating 0  Moving slowly or fidgety/restless 0  Suicidal thoughts 0  PHQ-9 Score 4  Difficult doing work/chores Somewhat difficult    Flowsheet Row ED from 11/17/2022 in [facility] Most recent reading at 11/17/2022  5:44 AM ED from 11/17/2022 in [facility] Emergency Department [facility] Most recent reading at 11/17/2022  1:00 AM ED from 11/16/2022 in [facility] Most recent reading at 11/16/2022 10:28 PM  C-SSRS RISK CATEGORY Moderate Risk Moderate Risk Moderate Risk       Musculoskeletal  Strength & Muscle Tone: within normal limits Gait & Station: normal Patient  leans: Backward  Psychiatric Specialty Exam  Presentation  General Appearance:  Appropriate for Environment; Casual  Eye Contact: Fair  Speech: Normal Rate  Speech Volume: Normal  Handedness: Right   Mood and Affect  Mood: Anxious; Depressed  Affect: Appropriate; Congruent   Thought Process  Thought Processes: Goal Directed; Linear  Descriptions of Associations:Intact  Orientation:Full (Time, Place and Person)  Thought Content:Logical  Diagnosis of Schizophrenia or Schizoaffective disorder in past: No    Hallucinations:Hallucinations: None  Ideas of Reference:None  Suicidal Thoughts:Suicidal Thoughts: No SI Passive Intent and/or Plan: Without Intent; Without Plan; Without Means to Carry Out  Homicidal Thoughts:Homicidal Thoughts: No   Sensorium  Memory: Immediate Fair  Judgment: Fair  Insight: Fair   Chartered certified accountant: Fair  Attention Span: Fair  Recall: Fair  Fund of Knowledge: Fair  Language: Good   Psychomotor Activity  Psychomotor Activity: Psychomotor Activity: Normal   Assets  Assets: Desire for Improvement   Sleep  Sleep: Sleep: Fair Number of Hours of Sleep: 5   Nutritional Assessment (For OBS and FBC admissions only) Has the patient had a weight loss or gain of 10 pounds or more in the last 3 months?: No Has the patient had a decrease in food intake/or appetite?: No Does the patient have dental problems?: No Does the patient have eating habits or behaviors that may be indicators of an eating disorder including binging or inducing vomiting?: No Has the patient recently lost weight without trying?: 0 Has the patient been eating poorly because of a decreased appetite?: 0 Malnutrition Screening Tool Score: 0    Physical Exam  Physical Exam Constitutional:      Appearance: Normal appearance. He is not toxic-appearing or diaphoretic.  Eyes:     Conjunctiva/sclera: Conjunctivae normal.   Cardiovascular:     Rate and Rhythm: Normal rate.  Pulmonary:     Effort: Pulmonary effort is normal.  Musculoskeletal:        General: Normal range of motion.     Cervical back: Normal range of motion.  Skin:    General: Skin is warm.  Neurological:     General: No focal deficit present.     Mental Status: He is alert and oriented to person, place, and time. Mental status is at baseline.  Psychiatric:        Attention and Perception: He does not perceive auditory or visual hallucinations.        Mood and Affect: Mood is anxious and depressed.        Speech: Speech normal.        Behavior: Behavior is cooperative.        Thought Content: Thought content is not paranoid or delusional. Thought content does not include homicidal or suicidal ideation. Thought content does not include homicidal or suicidal plan.    Review of Systems  Constitutional:  Positive for chills and malaise/fatigue. Negative for diaphoresis and fever.  Respiratory:  Negative for cough, hemoptysis and shortness of breath.   Gastrointestinal:  Negative for abdominal pain, diarrhea, nausea and vomiting.  Musculoskeletal:  Positive for myalgias.  Neurological:  Negative for tremors, seizures, loss of consciousness and headaches.  Psychiatric/Behavioral:  Positive for substance abuse. Negative for depression, hallucinations and suicidal ideas. The patient is nervous/anxious. The patient does not have insomnia.    Blood pressure (!) 152/99, pulse 99, temperature 98.2 F (36.8 C), temperature source Oral, resp. rate 18, SpO2 95%. There is no height or weight on file to calculate BMI.  Demographic Factors:  Male, Caucasian, Low socioeconomic status, Living alone, and Unemployed  Loss Factors: Decrease in vocational status, Legal issues, and Financial problems/change in socioeconomic status  Historical Factors: Prior suicide attempts and Impulsivity  Risk Reduction Factors:   NA  Continued Clinical Symptoms:   Alcohol/Substance Abuse/Dependencies  Cognitive Features That Contribute To Risk:  None    Suicide Risk:  Moderate:  Frequent suicidal ideation with limited intensity, and duration, some specificity in terms of plans, no associated intent, good self-control, limited dysphoria/symptomatology, some risk factors present, and identifiable protective factors, including available and accessible social support.  Plan Of Care/Follow-up recommendations:  Elek Nicosia is a 52 y.o.  male with a PMHx of chronic EtoH abuse who presented to the [facility] for detox and concerns of  suicidal ideation. He's frequently visted the ED for EtOH abuse and attempted detox in the past. Upon evaluation, I do not believe the patient is experiencing a medical emergency. Patient desires, treatment at facility such as [facility] however, has ongoing legal issues that prevent them from admission. He denies SI/HI/AVH currently.  Is motivated to achieve sobriety.    He desires inpatient treatment for his EtOH abuse and believe he is appropriate to go the [facility]. Is agreeable to stay for at least 5 days. He denies a history of seizures during prior withdrawals. Reports muscles aches, fatigue, chlls, cold sweats and anxiety as current withdrawal symptoms.   Disposition: Facility Based [facility]  Peterson Ao, MD 11/17/2022, 2:25 PM

## 2022-11-17 NOTE — ED Notes (Signed)
Patient A&Ox4. Denies intent to harm self/others when asked. Denies A/VH. Patient denies any physical complaints when asked. Pt states, "I'm tired. I'm ready to detox and get clean. It's time". Support and encouragement provided. Routine safety checks conducted according to facility protocol. Encouraged patient to notify staff if thoughts of harm toward self or others arise. Patient verbalize understanding and agreement. Will continue to monitor for safety.

## 2022-11-17 NOTE — Progress Notes (Signed)
Pt was transferred from [facility] and admitted to [facility] due alcohol abuse and passive SI. Pt currently denies and verbally contracts for safety on the unit. Pt is alert and oriented X4. Pt is ambulatory and is oriented to staff/unit. Pt was cooperative with the admission process and skin assessment. Pt has what appears to be bug bites on his whole body and he reports that it was due sleeping in the woods. Generalized bruises were noted on pt's body. Pt denies pain and current HI/AVH. 15 minutes safety checks initiated per order. Staff will monitor for pt's safety.

## 2022-11-17 NOTE — ED Notes (Signed)
Pt sleeping in no acute distress. RR even and unlabored. Environment secured. Will continue to monitor for safety. 

## 2022-11-17 NOTE — Discharge Instructions (Addendum)

## 2022-11-17 NOTE — ED Provider Notes (Signed)
Facility Based Crisis Admission H&P  Date: 11/17/22 [name] MRN: [id number] Chief Complaint: Chronic EtOH abuse   Diagnoses:  Final diagnoses:  ETOH abuse    HPI:  Gary Mclaughlin is a 52 y.o. male with a PMHx chronic EtOH abuse who presents to the [facility] for detox. Prior to admission he was drinking ~6 40 oz beers prior to admission.  Current withdrawal symptoms include chills, anxiety, cold sweats and muscle aches. Denies history of seizures while withdrawing. Most recent detox was in the [facility] ED in 09/2022. Longest period of sobriety was 55 days ago. Recent life stressors led him to start drinking again. Mantained sobriety by going to group sessions and has been his only source of support. Denies any family around of support with brother and son in [place]. Previously spoke to a few years ago and it didn't go well. Mother still alive but lives in [place].  [employer] was useful in the past, however has an ongoing legal case for breaking and entering on August 13th. Is aware that he won't qualify for [facility] with legal issues pending. Denies any current SI/HI/AVH. Unemployed, homeless and lives in a tent. Been living in [place] since 2012.   PHQ 2-9:  Flowsheet Row ED from 11/17/2022 in [facility] ED from 05/02/2021 in [facility]  Thoughts that you would be better off dead, or of hurting yourself in some way Several days Not at all  PHQ-9 Total Score 11 4       Flowsheet Row ED from 11/17/2022 in [facility] Most recent reading at 11/17/2022  4:34 PM ED from 11/17/2022 in [facility] Most recent reading at 11/17/2022  5:44 AM ED from 11/17/2022 in [facility] Emergency Department at [facility] Most recent reading at 11/17/2022  1:00 AM  C-SSRS RISK CATEGORY No Risk Moderate Risk Moderate Risk         Total Time spent with patient: 15  minutes  Musculoskeletal  Strength & Muscle Tone: within normal limits Gait & Station: normal Patient leans: N/A  Psychiatric Specialty Exam  Presentation General Appearance:  Disheveled; Appropriate for Environment  Eye Contact: Fair  Speech: Normal Rate  Speech Volume: Normal  Handedness: Right   Mood and Affect  Mood: Anxious; Depressed  Affect: Appropriate; Congruent   Thought Process  Thought Processes: Goal Directed; Linear  Descriptions of Associations:Intact  Orientation:Full (Time, Place and Person)  Thought Content:Logical  Diagnosis of Schizophrenia or Schizoaffective disorder in past: No   Hallucinations:Hallucinations: None  Ideas of Reference:None  Suicidal Thoughts:Suicidal Thoughts: No SI Passive Intent and/or Plan: Without Intent; Without Plan; Without Means to Carry Out  Homicidal Thoughts:Homicidal Thoughts: No   Sensorium  Memory: Immediate Fair  Judgment: Fair  Insight: Fair   Art therapist  Concentration: Fair  Attention Span: Fair  Recall: Fair  Fund of Knowledge: Fair  Language: Good   Psychomotor Activity  Psychomotor Activity: Psychomotor Activity: Normal   Assets  Assets: Desire for Improvement   Sleep  Sleep: Sleep: Fair Number of Hours of Sleep: 5   Nutritional Assessment (For OBS and FBC admissions only) Has the patient had a weight loss or gain of 10 pounds or more in the last 3 months?: No Has the patient had a decrease in food intake/or appetite?: No Does the patient have dental problems?: No Does the patient have eating habits or behaviors that may be indicators of an eating disorder  including binging or inducing vomiting?: No Has the patient recently lost weight without trying?: 0 Has the patient been eating poorly because of a decreased appetite?: 0 Malnutrition Screening Tool Score: 0    Physical Exam HENT:     Head: Normocephalic.  Eyes:     Conjunctiva/sclera:  Conjunctivae normal.  Cardiovascular:     Rate and Rhythm: Normal rate.  Pulmonary:     Effort: Pulmonary effort is normal.  Neurological:     Mental Status: He is alert.  Psychiatric:        Attention and Perception: Attention and perception normal. He does not perceive auditory or visual hallucinations.        Mood and Affect: Mood is anxious and depressed.        Speech: Speech normal.        Behavior: Behavior normal. Behavior is cooperative.        Thought Content: Thought content is not paranoid or delusional. Thought content does not include homicidal or suicidal ideation. Thought content does not include homicidal or suicidal plan.        Cognition and Memory: Memory is not impaired. He does not exhibit impaired recent memory.        Judgment: Judgment normal.    Review of Systems  Constitutional:  Positive for chills and malaise/fatigue. Negative for fever.  HENT:  Negative for ear discharge.   Eyes:  Negative for blurred vision.  Respiratory:  Negative for cough, hemoptysis and shortness of breath.   Cardiovascular:  Negative for chest pain.  Gastrointestinal:  Negative for abdominal pain, diarrhea, nausea and vomiting.  Musculoskeletal:  Positive for myalgias.  Skin:  Negative for itching.  Neurological:  Negative for dizziness, tremors, seizures, loss of consciousness and headaches.  Psychiatric/Behavioral:  Positive for substance abuse. Negative for hallucinations. The patient is nervous/anxious. The patient does not have insomnia.     Blood pressure 135/87, pulse 80, temperature 98.6 F (37 C), temperature source Oral, resp. rate 18, SpO2 97%. There is no height or weight on file to calculate BMI.  Past Psychiatric History: EtOH abuse   Is the patient at risk to self? Yes  Has the patient been a risk to self in the past 6 months? Yes .    Has the patient been a risk to self within the distant past? Yes   Is the patient a risk to others? No   Has the patient been a  risk to others in the past 6 months? No   Has the patient been a risk to others within the distant past? No   Past Medical History: Subarachnoid hemorrhage, tobacco abuse, hepatic steatosis, transaminatis, COPD Family History: N/a Social History: Homeless since 2012 and lives in a tent. Unemployed, last job in [employer]. Has family a brother and son in [place]. Mom is still alive in [place]. No family support nearby.   Last Labs:  Admission on 11/17/2022, Discharged on 11/17/2022  Component Date Value Ref Range Status   Sodium 11/17/2022 141  135 - 145 mmol/L Final   Potassium 11/17/2022 3.5  3.5 - 5.1 mmol/L Final   Chloride 11/17/2022 107  98 - 111 mmol/L Final   CO2 11/17/2022 22  22 - 32 mmol/L Final   Glucose, Bld 11/17/2022 128 (H)  70 - 99 mg/dL Final   Glucose reference range applies only to samples taken after fasting for at least 8 hours.   BUN 11/17/2022 8  6 - 20 mg/dL Final   Creatinine,  Ser 11/17/2022 0.78  0.61 - 1.24 mg/dL Final   Calcium 28/41/3244 9.1  8.9 - 10.3 mg/dL Final   Total Protein 05/07/7251 6.7  6.5 - 8.1 g/dL Final   Albumin 66/44/0347 3.4 (L)  3.5 - 5.0 g/dL Final   AST 42/59/5638 32  15 - 41 U/L Final   ALT 11/17/2022 25  0 - 44 U/L Final   Alkaline Phosphatase 11/17/2022 74  38 - 126 U/L Final   Total Bilirubin 11/17/2022 0.3  0.3 - 1.2 mg/dL Final   GFR, Estimated 11/17/2022 >60  >60 mL/min Final   Comment: (NOTE) Calculated using the CKD-EPI Creatinine Equation (2021)    Anion gap 11/17/2022 12  5 - 15 Final   Performed at [facility] Lab, [address], [place], [place] [address]   Alcohol, Ethyl (B) 11/17/2022 251 (H)  <10 mg/dL Final   Comment: (NOTE) Lowest detectable limit for serum alcohol is 10 mg/dL.  For medical purposes only. Performed at [facility], [address] [place], [place] [address]    WBC 11/17/2022 5.6  4.0 - 10.5 K/uL Final   RBC 11/17/2022 3.71 (L)  4.22 - 5.81 MIL/uL Final   Hemoglobin 11/17/2022 11.5  (L)  13.0 - 17.0 g/dL Final   HCT 88/41/6606 34.8 (L)  39.0 - 52.0 % Final   MCV 11/17/2022 93.8  80.0 - 100.0 fL Final   MCH 11/17/2022 31.0  26.0 - 34.0 pg Final   MCHC 11/17/2022 33.0  30.0 - 36.0 g/dL Final   RDW 30/16/0109 12.1  11.5 - 15.5 % Final   Platelets 11/17/2022 290  150 - 400 K/uL Final   nRBC 11/17/2022 0.0  0.0 - 0.2 % Final   Neutrophils Relative % 11/17/2022 41  % Final   Neutro Abs 11/17/2022 2.3  1.7 - 7.7 K/uL Final   Lymphocytes Relative 11/17/2022 37  % Final   Lymphs Abs 11/17/2022 2.1  0.7 - 4.0 K/uL Final   Monocytes Relative 11/17/2022 5  % Final   Monocytes Absolute 11/17/2022 0.3  0.1 - 1.0 K/uL Final   Eosinophils Relative 11/17/2022 15  % Final   Eosinophils Absolute 11/17/2022 0.8 (H)  0.0 - 0.5 K/uL Final   Basophils Relative 11/17/2022 2  % Final   Basophils Absolute 11/17/2022 0.1  0.0 - 0.1 K/uL Final   Immature Granulocytes 11/17/2022 0  % Final   Abs Immature Granulocytes 11/17/2022 0.01  0.00 - 0.07 K/uL Final   Performed at [facility] Lab, [address] [place], [place] [address]  Admission on 11/16/2022, Discharged on 11/17/2022  Component Date Value Ref Range Status   WBC 11/16/2022 6.0  4.0 - 10.5 K/uL Final   RBC 11/16/2022 3.87 (L)  4.22 - 5.81 MIL/uL Final   Hemoglobin 11/16/2022 11.8 (L)  13.0 - 17.0 g/dL Final   HCT 73/22/0254 35.6 (L)  39.0 - 52.0 % Final   MCV 11/16/2022 92.0  80.0 - 100.0 fL Final   MCH 11/16/2022 30.5  26.0 - 34.0 pg Final   MCHC 11/16/2022 33.1  30.0 - 36.0 g/dL Final   RDW 27/10/2374 12.4  11.5 - 15.5 % Final   Platelets 11/16/2022 325  150 - 400 K/uL Final   nRBC 11/16/2022 0.0  0.0 - 0.2 % Final   Neutrophils Relative % 11/16/2022 43  % Final   Neutro Abs 11/16/2022 2.6  1.7 - 7.7 K/uL Final   Lymphocytes Relative 11/16/2022 37  % Final  Lymphs Abs 11/16/2022 2.2  0.7 - 4.0 K/uL Final   Monocytes Relative 11/16/2022 5  % Final   Monocytes Absolute 11/16/2022 0.3  0.1 - 1.0 K/uL Final   Eosinophils  Relative 11/16/2022 13  % Final   Eosinophils Absolute 11/16/2022 0.8 (H)  0.0 - 0.5 K/uL Final   Basophils Relative 11/16/2022 2  % Final   Basophils Absolute 11/16/2022 0.1  0.0 - 0.1 K/uL Final   Immature Granulocytes 11/16/2022 0  % Final   Abs Immature Granulocytes 11/16/2022 0.01  0.00 - 0.07 K/uL Final   Performed at [facility] Lab, [address], [place], [place] [address]   Sodium 11/16/2022 141  135 - 145 mmol/L Final   Potassium 11/16/2022 4.1  3.5 - 5.1 mmol/L Final   Chloride 11/16/2022 107  98 - 111 mmol/L Final   CO2 11/16/2022 23  22 - 32 mmol/L Final   Glucose, Bld 11/16/2022 96  70 - 99 mg/dL Final   Glucose reference range applies only to samples taken after fasting for at least 8 hours.   BUN 11/16/2022 8  6 - 20 mg/dL Final   Creatinine, Ser 11/16/2022 0.80  0.61 - 1.24 mg/dL Final   Calcium 19/14/7829 9.5  8.9 - 10.3 mg/dL Final   Total Protein 56/21/3086 6.9  6.5 - 8.1 g/dL Final   Albumin 57/84/6962 3.8  3.5 - 5.0 g/dL Final   AST 95/28/4132 32  15 - 41 U/L Final   ALT 11/16/2022 27  0 - 44 U/L Final   Alkaline Phosphatase 11/16/2022 79  38 - 126 U/L Final   Total Bilirubin 11/16/2022 0.4  0.3 - 1.2 mg/dL Final   GFR, Estimated 11/16/2022 >60  >60 mL/min Final   Comment: (NOTE) Calculated using the CKD-EPI Creatinine Equation (2021)    Anion gap 11/16/2022 11  5 - 15 Final   Performed at [facility] Lab, [address] [place], [place] [address]   Alcohol, Ethyl (B) 11/16/2022 350 (HH)  <10 mg/dL Final   Comment: CRITICAL RESULT CALLED TO, READ BACK BY AND VERIFIED WITH Clearnce Hasten, RN. [phone] 11/16/22. LPAIT (NOTE) Lowest detectable limit for serum alcohol is 10 mg/dL.  For medical purposes only. Performed at [facility] Lab, [address] [place], [place] [address]    Cholesterol 11/16/2022 209 (H)  0 - 200 mg/dL Final   Triglycerides 03/47/4259 134  <150 mg/dL Final   HDL 56/38/7564 55  >40 mg/dL Final   Total CHOL/HDL Ratio 11/16/2022 3.8   RATIO Final   VLDL 11/16/2022 27  0 - 40 mg/dL Final   LDL Cholesterol 11/16/2022 127 (H)  0 - 99 mg/dL Final   Comment:        Total Cholesterol/HDL:CHD Risk Coronary Heart Disease Risk Table                     Men   Women  1/2 Average Risk   3.4   3.3  Average Risk       5.0   4.4  2 X Average Risk   9.6   7.1  3 X Average Risk  23.4   11.0        Use the calculated Patient Ratio above and the CHD Risk Table to determine the patient's CHD Risk.        ATP III CLASSIFICATION (LDL):  <100     mg/dL   Optimal  332-951  mg/dL   Near or  Above                    Optimal  130-159  mg/dL   Borderline  914-782  mg/dL   High  >956     mg/dL   Very High Performed at [facility], [address], [place], [place] [address]    POC Amphetamine UR 11/16/2022 None Detected  NONE DETECTED (Cut Off Level 1000 ng/mL) Final   POC Secobarbital (BAR) 11/16/2022 None Detected  NONE DETECTED (Cut Off Level 300 ng/mL) Final   POC Buprenorphine (BUP) 11/16/2022 None Detected  NONE DETECTED (Cut Off Level 10 ng/mL) Final   POC Oxazepam (BZO) 11/16/2022 Positive (A)  NONE DETECTED (Cut Off Level 300 ng/mL) Final   POC Cocaine UR 11/16/2022 None Detected  NONE DETECTED (Cut Off Level 300 ng/mL) Final   POC Methamphetamine UR 11/16/2022 None Detected  NONE DETECTED (Cut Off Level 1000 ng/mL) Final   POC Morphine 11/16/2022 None Detected  NONE DETECTED (Cut Off Level 300 ng/mL) Final   POC Methadone UR 11/16/2022 None Detected  NONE DETECTED (Cut Off Level 300 ng/mL) Final   POC Oxycodone UR 11/16/2022 None Detected  NONE DETECTED (Cut Off Level 100 ng/mL) Final   POC Marijuana UR 11/16/2022 None Detected  NONE DETECTED (Cut Off Level 50 ng/mL) Final  Admission on 09/20/2022, Discharged on 09/20/2022  Component Date Value Ref Range Status   Sodium 09/20/2022 133 (L)  135 - 145 mmol/L Final   Potassium 09/20/2022 3.9  3.5 - 5.1 mmol/L Final   Chloride 09/20/2022 98  98 - 111 mmol/L Final   CO2  09/20/2022 23  22 - 32 mmol/L Final   Glucose, Bld 09/20/2022 107 (H)  70 - 99 mg/dL Final   Glucose reference range applies only to samples taken after fasting for at least 8 hours.   BUN 09/20/2022 15  6 - 20 mg/dL Final   Creatinine, Ser 09/20/2022 0.85  0.61 - 1.24 mg/dL Final   Calcium 65/78/4696 10.1  8.9 - 10.3 mg/dL Final   GFR, Estimated 09/20/2022 >60  >60 mL/min Final   Comment: (NOTE) Calculated using the CKD-EPI Creatinine Equation (2021)    Anion gap 09/20/2022 12  5 - 15 Final   Performed at [facility], [address] [place], [place] [address]   WBC 09/20/2022 9.2  4.0 - 10.5 K/uL Final   RBC 09/20/2022 3.05 (L)  4.22 - 5.81 MIL/uL Final   Hemoglobin 09/20/2022 9.9 (L)  13.0 - 17.0 g/dL Final   HCT 41/32/4401 30.6 (L)  39.0 - 52.0 % Final   MCV 09/20/2022 100.3 (H)  80.0 - 100.0 fL Final   MCH 09/20/2022 32.5  26.0 - 34.0 pg Final   MCHC 09/20/2022 32.4  30.0 - 36.0 g/dL Final   RDW 02/72/5366 13.6  11.5 - 15.5 % Final   Platelets 09/20/2022 608 (H)  150 - 400 K/uL Final   nRBC 09/20/2022 0.0  0.0 - 0.2 % Final   Performed at [facility], [address], [place], [place] [address]   Troponin I (High Sensitivity) 09/20/2022 6  <18 ng/L Final   Comment: (NOTE) Elevated high sensitivity troponin I (hsTnI) values and significant  changes across serial measurements may suggest ACS but many other  chronic and acute conditions are known to elevate hsTnI results.  Refer to the "Links" section for chest pain algorithms and additional  guidance. Performed at [facility] Lab, [address] [place], [place] [address]    Troponin I (High Sensitivity) 09/20/2022 6  <18 ng/L Final   Comment: (NOTE) Elevated high sensitivity troponin I (hsTnI) values and significant  changes across serial measurements may suggest ACS but many other  chronic and acute conditions are known to elevate hsTnI results.  Refer to the "Links" section for chest pain algorithms and  additional  guidance. Performed at [facility] Lab, [address] [place], [place] [address]   Admission on 09/14/2022, Discharged on 09/17/2022  Component Date Value Ref Range Status   Sodium 09/14/2022 127 (L)  135 - 145 mmol/L Final   Potassium 09/14/2022 4.0  3.5 - 5.1 mmol/L Final   Chloride 09/14/2022 89 (L)  98 - 111 mmol/L Final   CO2 09/14/2022 22  22 - 32 mmol/L Final   Glucose, Bld 09/14/2022 94  70 - 99 mg/dL Final   Glucose reference range applies only to samples taken after fasting for at least 8 hours.   BUN 09/14/2022 9  6 - 20 mg/dL Final   Creatinine, Ser 09/14/2022 0.84  0.61 - 1.24 mg/dL Final   Calcium 82/95/6213 9.7  8.9 - 10.3 mg/dL Final   GFR, Estimated 09/14/2022 >60  >60 mL/min Final   Comment: (NOTE) Calculated using the CKD-EPI Creatinine Equation (2021)    Anion gap 09/14/2022 16 (H)  5 - 15 Final   Performed at [facility], [address] [place], [place] [address]   WBC 09/14/2022 11.9 (H)  4.0 - 10.5 K/uL Final   RBC 09/14/2022 3.65 (L)  4.22 - 5.81 MIL/uL Final   Hemoglobin 09/14/2022 12.1 (L)  13.0 - 17.0 g/dL Final   HCT 84/69/6295 34.7 (L)  39.0 - 52.0 % Final   MCV 09/14/2022 95.1  80.0 - 100.0 fL Final   MCH 09/14/2022 33.2  26.0 - 34.0 pg Final   MCHC 09/14/2022 34.9  30.0 - 36.0 g/dL Final   RDW 28/41/3244 12.9  11.5 - 15.5 % Final   Platelets 09/14/2022 300  150 - 400 K/uL Final   nRBC 09/14/2022 0.0  0.0 - 0.2 % Final   Performed at [facility], [address], [place], [place] [address]   Troponin I (High Sensitivity) 09/14/2022 118 (HH)  <18 ng/L Final   Comment: CRITICAL RESULT CALLED TO, READ BACK BY AND VERIFIED WITH C KHOURI RN AT [phone] BY D LONG (NOTE) Elevated high sensitivity troponin I (hsTnI) values and significant  changes across serial measurements may suggest ACS but many other  chronic and acute conditions are known to elevate hsTnI results.  Refer to the "Links" section for chest pain  algorithms and additional  guidance. Performed at [facility], [address] [place], [place] [address]    Alcohol, Ethyl (B) 09/14/2022 <10  <10 mg/dL Final   Comment: (NOTE) Lowest detectable limit for serum alcohol is 10 mg/dL.  For medical purposes only. Performed at [facility], [address], [place], [place] [address]    Opiates 09/14/2022 NONE DETECTED  NONE DETECTED Final   Cocaine 09/14/2022 NONE DETECTED  NONE DETECTED Final   Benzodiazepines 09/14/2022 NONE DETECTED  NONE DETECTED Final   Amphetamines 09/14/2022 NONE DETECTED  NONE DETECTED Final   Tetrahydrocannabinol 09/14/2022 POSITIVE (A)  NONE DETECTED Final   Barbiturates 09/14/2022 NONE DETECTED  NONE DETECTED Final   Comment: (NOTE) DRUG SCREEN FOR MEDICAL PURPOSES ONLY.  IF CONFIRMATION IS NEEDED FOR ANY PURPOSE,  NOTIFY LAB WITHIN 5 DAYS.  LOWEST DETECTABLE LIMITS FOR URINE DRUG SCREEN Drug Class                     Cutoff (ng/mL) Amphetamine and metabolites    1000 Barbiturate and metabolites    200 Benzodiazepine                 200 Opiates and metabolites        300 Cocaine and metabolites        300 THC                            50 Performed at [facility] Lab, [address], [place], [place] [address]    D-Dimer, Quant 09/14/2022 2.46 (H)  0.00 - 0.50 ug/mL-FEU Final   Comment: (NOTE) At the manufacturer cut-off value of 0.5 g/mL FEU, this assay has a negative predictive value of 95-100%.This assay is intended for use in conjunction with a clinical pretest probability (PTP) assessment model to exclude pulmonary embolism (PE) and deep venous thrombosis (DVT) in outpatients suspected of PE or DVT. Results should be correlated with clinical presentation. Performed at [facility], [address], [place], [place] [address]    Troponin I (High Sensitivity) 09/14/2022 17  <18 ng/L Final   Comment: DELTA CHECK NOTED (NOTE) Elevated high sensitivity troponin I (hsTnI)  values and significant  changes across serial measurements may suggest ACS but many other  chronic and acute conditions are known to elevate hsTnI results.  Refer to the "Links" section for chest pain algorithms and additional  guidance. Performed at [facility], [address] [place], [place] [address]    Adenovirus 09/14/2022 NOT DETECTED  NOT DETECTED Final   Coronavirus 229E 09/14/2022 NOT DETECTED  NOT DETECTED Final   Comment: (NOTE) The Coronavirus on the Respiratory Panel, DOES NOT test for the novel  Coronavirus (2019 nCoV)    Coronavirus HKU1 09/14/2022 NOT DETECTED  NOT DETECTED Final   Coronavirus NL63 09/14/2022 NOT DETECTED  NOT DETECTED Final   Coronavirus OC43 09/14/2022 NOT DETECTED  NOT DETECTED Final   Metapneumovirus 09/14/2022 NOT DETECTED  NOT DETECTED Final   Rhinovirus / Enterovirus 09/14/2022 NOT DETECTED  NOT DETECTED Final   Influenza A 09/14/2022 NOT DETECTED  NOT DETECTED Final   Influenza B 09/14/2022 NOT DETECTED  NOT DETECTED Final   Parainfluenza Virus 1 09/14/2022 NOT DETECTED  NOT DETECTED Final   Parainfluenza Virus 2 09/14/2022 NOT DETECTED  NOT DETECTED Final   Parainfluenza Virus 3 09/14/2022 NOT DETECTED  NOT DETECTED Final   Parainfluenza Virus 4 09/14/2022 NOT DETECTED  NOT DETECTED Final   Respiratory Syncytial Virus 09/14/2022 NOT DETECTED  NOT DETECTED Final   Bordetella pertussis 09/14/2022 NOT DETECTED  NOT DETECTED Final   Bordetella Parapertussis 09/14/2022 NOT DETECTED  NOT DETECTED Final   Chlamydophila pneumoniae 09/14/2022 NOT DETECTED  NOT DETECTED Final   Mycoplasma pneumoniae 09/14/2022 NOT DETECTED  NOT DETECTED Final   Performed at [facility], [address] [place], [place] [address]   SARS Coronavirus 2 by RT PCR 09/14/2022 NEGATIVE  NEGATIVE Final   Performed at [facility], [address], [place], [place] [address]   Sodium 09/15/2022 129 (L)  135 - 145 mmol/L Final   Potassium 09/15/2022 3.2 (L)   3.5 - 5.1 mmol/L Final   Chloride 09/15/2022 95 (L)  98 -  111 mmol/L Final   CO2 09/15/2022 26  22 - 32 mmol/L Final   Glucose, Bld 09/15/2022 94  70 - 99 mg/dL Final   Glucose reference range applies only to samples taken after fasting for at least 8 hours.   BUN 09/15/2022 10  6 - 20 mg/dL Final   Creatinine, Ser 09/15/2022 0.76  0.61 - 1.24 mg/dL Final   Calcium 14/78/2956 9.0  8.9 - 10.3 mg/dL Final   Total Protein 21/30/8657 6.4 (L)  6.5 - 8.1 g/dL Final   Albumin 84/69/6295 2.6 (L)  3.5 - 5.0 g/dL Final   AST 28/41/3244 55 (H)  15 - 41 U/L Final   ALT 09/15/2022 42  0 - 44 U/L Final   Alkaline Phosphatase 09/15/2022 112  38 - 126 U/L Final   Total Bilirubin 09/15/2022 0.8  0.3 - 1.2 mg/dL Final   GFR, Estimated 09/15/2022 >60  >60 mL/min Final   Comment: (NOTE) Calculated using the CKD-EPI Creatinine Equation (2021)    Anion gap 09/15/2022 8  5 - 15 Final   Performed at [facility], [address] [place], [place] [address]   WBC 09/15/2022 8.0  4.0 - 10.5 K/uL Final   RBC 09/15/2022 3.10 (L)  4.22 - 5.81 MIL/uL Final   Hemoglobin 09/15/2022 10.4 (L)  13.0 - 17.0 g/dL Final   HCT 25/36/6440 30.0 (L)  39.0 - 52.0 % Final   MCV 09/15/2022 96.8  80.0 - 100.0 fL Final   MCH 09/15/2022 33.5  26.0 - 34.0 pg Final   MCHC 09/15/2022 34.7  30.0 - 36.0 g/dL Final   RDW 34/74/2595 13.2  11.5 - 15.5 % Final   Platelets 09/15/2022 302  150 - 400 K/uL Final   nRBC 09/15/2022 0.0  0.0 - 0.2 % Final   Performed at [facility], [address], [place], [place] [address]   Osmolality, Ur 09/14/2022 495  300 - 900 mOsm/kg Final   Performed at [facility], [address], [place], [place] [address]   Sodium, Ur 09/14/2022 <10  mmol/L Final   Performed at [facility], [address] [place], [place] [address]   Vitamin B-12 09/15/2022 518  180 - 914 pg/mL Final   Comment: (NOTE) This assay is not validated for testing neonatal or myeloproliferative syndrome  specimens for Vitamin B12 levels. Performed at [facility], [address] [place], [place] [address]    Folate 09/15/2022 15.3  >5.9 ng/mL Final   Performed at [facility], [address], [place], [place] [address]   Magnesium 09/15/2022 2.0  1.7 - 2.4 mg/dL Final   Performed at [facility], [address], [place], [place] [address]   MRSA by PCR Next Gen 09/15/2022 NOT DETECTED  NOT DETECTED Final   Comment: (NOTE) The GeneXpert MRSA Assay (FDA approved for NASAL specimens only), is one component of a comprehensive MRSA colonization surveillance program. It is not intended to diagnose MRSA infection nor to guide or monitor treatment for MRSA infections. Test performance is not FDA approved in patients less than 38 years old. Performed at [facility], [address] [place], [place] [address]    Sodium 09/15/2022 131 (L)  135 - 145 mmol/L Final   Potassium 09/15/2022 4.1  3.5 - 5.1 mmol/L Final   Chloride 09/15/2022 94 (L)  98 - 111 mmol/L Final   CO2 09/15/2022 27  22 - 32 mmol/L  Final   Glucose, Bld 09/15/2022 178 (H)  70 - 99 mg/dL Final   Glucose reference range applies only to samples taken after fasting for at least 8 hours.   BUN 09/15/2022 12  6 - 20 mg/dL Final   Creatinine, Ser 09/15/2022 0.80  0.61 - 1.24 mg/dL Final   Calcium 09/81/1914 9.5  8.9 - 10.3 mg/dL Final   GFR, Estimated 09/15/2022 >60  >60 mL/min Final   Comment: (NOTE) Calculated using the CKD-EPI Creatinine Equation (2021)    Anion gap 09/15/2022 10  5 - 15 Final   Performed at [facility], [address] [place], [place] [address]   Sodium 09/16/2022 129 (L)  135 - 145 mmol/L Final   Potassium 09/16/2022 3.9  3.5 - 5.1 mmol/L Final   Chloride 09/16/2022 96 (L)  98 - 111 mmol/L Final   CO2 09/16/2022 24  22 - 32 mmol/L Final   Glucose, Bld 09/16/2022 207 (H)  70 - 99 mg/dL Final   Glucose reference range applies only to samples taken after fasting for at least 8  hours.   BUN 09/16/2022 14  6 - 20 mg/dL Final   Creatinine, Ser 09/16/2022 0.64  0.61 - 1.24 mg/dL Final   Calcium 62/13/0865 9.6  8.9 - 10.3 mg/dL Final   GFR, Estimated 09/16/2022 >60  >60 mL/min Final   Comment: (NOTE) Calculated using the CKD-EPI Creatinine Equation (2021)    Anion gap 09/16/2022 9  5 - 15 Final   Performed at [facility], [address], [place], [place] [address]   Magnesium 09/16/2022 1.6 (L)  1.7 - 2.4 mg/dL Final   Performed at [facility], [address], [place], [place] [address]   Phosphorus 09/16/2022 2.1 (L)  2.5 - 4.6 mg/dL Final   Performed at [facility], [address], [place], [place] [address]   Sodium 09/17/2022 133 (L)  135 - 145 mmol/L Final   Potassium 09/17/2022 3.7  3.5 - 5.1 mmol/L Final   Chloride 09/17/2022 97 (L)  98 - 111 mmol/L Final   CO2 09/17/2022 26  22 - 32 mmol/L Final   Glucose, Bld 09/17/2022 161 (H)  70 - 99 mg/dL Final   Glucose reference range applies only to samples taken after fasting for at least 8 hours.   BUN 09/17/2022 12  6 - 20 mg/dL Final   Creatinine, Ser 09/17/2022 0.80  0.61 - 1.24 mg/dL Final   Calcium 44/05/270 9.5  8.9 - 10.3 mg/dL Final   GFR, Estimated 09/17/2022 >60  >60 mL/min Final   Comment: (NOTE) Calculated using the CKD-EPI Creatinine Equation (2021)    Anion gap 09/17/2022 10  5 - 15 Final   Performed at [facility], [address], [place], [place] [address]  Admission on 08/31/2022, Discharged on 08/31/2022  Component Date Value Ref Range Status   Sodium 08/31/2022 135  135 - 145 mmol/L Final   Potassium 08/31/2022 4.2  3.5 - 5.1 mmol/L Final   HEMOLYSIS AT THIS LEVEL MAY AFFECT RESULT   Chloride 08/31/2022 100  98 - 111 mmol/L Final   CO2 08/31/2022 24  22 - 32 mmol/L Final   Glucose, Bld 08/31/2022 134 (H)  70 - 99 mg/dL Final   Glucose reference range applies only to samples taken after fasting for at least 8 hours.   BUN 08/31/2022 9  6 - 20 mg/dL Final    Creatinine, Ser 08/31/2022 0.89  0.61 - 1.24 mg/dL Final   Calcium 52/84/1324 9.1  8.9 - 10.3 mg/dL Final   Total Protein 40/02/2724 6.8  6.5 - 8.1 g/dL Final   Albumin 36/64/4034 3.3 (L)  3.5 - 5.0 g/dL Final   AST 74/25/9563 78 (H)  15 - 41 U/L Final   HEMOLYSIS AT THIS LEVEL MAY AFFECT RESULT   ALT 08/31/2022 49 (H)  0 - 44 U/L Final   HEMOLYSIS AT THIS LEVEL MAY AFFECT RESULT   Alkaline Phosphatase 08/31/2022 96  38 - 126 U/L Final   Total Bilirubin 08/31/2022 0.9  0.3 - 1.2 mg/dL Final   HEMOLYSIS AT THIS LEVEL MAY AFFECT RESULT   GFR, Estimated 08/31/2022 >60  >60 mL/min Final   Comment: (NOTE) Calculated using the CKD-EPI Creatinine Equation (2021)    Anion gap 08/31/2022 11  5 - 15 Final   Performed at [facility], [address] [place], [place] [address]   Alcohol, Ethyl (B) 08/31/2022 396 (HH)  <10 mg/dL Final   Comment: CRITICAL RESULT CALLED TO, READ BACK BY AND VERIFIED WITH C,CARTER RN @1714  08/31/22 E,BENTON (NOTE) Lowest detectable limit for serum alcohol is 10 mg/dL.  For medical purposes only. Performed at [facility], [address], [place], [place] [address]    WBC 08/31/2022 7.3  4.0 - 10.5 K/uL Final   RBC 08/31/2022 3.40 (L)  4.22 - 5.81 MIL/uL Final   Hemoglobin 08/31/2022 11.3 (L)  13.0 - 17.0 g/dL Final   HCT 18/84/1660 32.2 (L)  39.0 - 52.0 % Final   MCV 08/31/2022 94.7  80.0 - 100.0 fL Final   MCH 08/31/2022 33.2  26.0 - 34.0 pg Final   MCHC 08/31/2022 35.1  30.0 - 36.0 g/dL Final   RDW 63/05/6008 13.5  11.5 - 15.5 % Final   Platelets 08/31/2022 295  150 - 400 K/uL Final   nRBC 08/31/2022 0.0  0.0 - 0.2 % Final   Performed at [facility], [address] [place], [place] [address]    Allergies: Patient has no known allergies.  Medications:  Facility Ordered Medications  Medication   [COMPLETED] sodium chloride 0.9 % bolus 1,000 mL   LORazepam (ATIVAN) tablet 1 mg   Followed by   Melene Muller ON 11/18/2022] LORazepam (ATIVAN)  tablet 1 mg   Followed by   Melene Muller ON 11/19/2022] LORazepam (ATIVAN) tablet 1 mg   Followed by   Melene Muller ON 11/21/2022] LORazepam (ATIVAN) tablet 1 mg   [START ON 11/18/2022] multivitamin with minerals tablet 1 tablet   [START ON 11/18/2022] thiamine (VITAMIN B1) tablet 100 mg   acetaminophen (TYLENOL) tablet 650 mg   alum & mag hydroxide-simeth (MAALOX/MYLANTA) 200-200-20 MG/5ML suspension 30 mL   hydrOXYzine (ATARAX) tablet 25 mg   loperamide (IMODIUM) capsule 2-4 mg   LORazepam (ATIVAN) tablet 1 mg   magnesium hydroxide (MILK OF MAGNESIA) suspension 30 mL   ondansetron (ZOFRAN-ODT) disintegrating tablet 4 mg   traZODone (DESYREL) tablet 50 mg    Long Term Goals: Improvement in symptoms so as ready for discharge be   Short Term Goals: Pt will complete the PHQ9 on admission, day 3 and discharge., Patient will score a low risk of violence for 24 hours prior to discharge, and Patient will take medications as prescribed daily.  Medical Decision Making  Labs ordered prior to placement: CMP, EtOH, CBC, Covid, UDS     Continue Ativan taper  PRNS ordered   Recommendations  Based on my evaluation  the patient does not appear to have an emergency medical condition. Patient desires inpatient detox treatment to help improve drinking patterns of EtOH abuse. Has previously detoxed in the past with a 55 day sobriety period before life stressors. He meets criteria for inpatient treatment and will require several days of treatment to optimize likelihood of sobriety and patient voiced understanding .   EtOH Abuse - Patient chronically abuses alcohol, drinking ~ 6 40 oz beers daily  - Most recent detox was in the [facility] ED on May 2024  - Ativan taper currently in place  - CIWA protocol  - PRNs ordered   Peterson Ao, MD 11/17/22  4:49 PM

## 2022-11-17 NOTE — ED Notes (Signed)
Pt asleep at this hour. No apparent distress. RR even and unlabored. Monitored for safety.  

## 2022-11-17 NOTE — ED Notes (Signed)
Progress note   D: Pt seen at med window. Pt denies SI, HI, AVH. Pt rates pain  0/10. Pt rates anxiety  0/10 and depression  0/10. Denies any detox symptoms at present. "It's going pretty smoothly right now." Pt states he has detoxed from alcohol before. Denies any history of withdrawal seizures.  No other concerns noted at this time.  A: Pt provided support and encouragement. Pt given scheduled medication as prescribed. PRNs as appropriate. Q15 min checks for safety.   R: Pt safe on the unit. Will continue to monitor.

## 2022-11-17 NOTE — Progress Notes (Signed)
Pt's CIWA WAS 1.

## 2022-11-17 NOTE — ED Notes (Signed)
Ambulance on scene pt being transferred to [facility]

## 2022-11-17 NOTE — ED Provider Notes (Signed)
EMERGENCY DEPARTMENT AT [facility] Provider Note   CSN: [id number] Arrival date & time: 11/17/22  0058     History  Chief Complaint  Patient presents with   Alcohol Intoxication   Suicidal    Gary Mclaughlin is a 52 y.o. male.  Patient presents to the emergency department via EMS from [facility] for medical clearance.  Patient presented to the behavioral health urgent care requesting help with alcohol detox and complaining of suicidal ideations.  The facility plans on keeping the patient in the continuous assessment unit but due to patient's intoxication felt that the patient needed medical clearance first.  Patient reports drinking "220 ounces" of beer daily, continuously since 2022.  He states he drank just prior to going to the urgent care yesterday.  He denies abdominal pain, shortness of breath, chest pain, urinary symptoms at this time.  He does endorse some mild anxiety.  Past medical history significant for alcohol dependence with intoxication, subarachnoid hemorrhage, anemia, seizure disorder, COPD  HPI     Home Medications Prior to Admission medications   Medication Sig Start Date End Date Taking? Authorizing Provider  benzonatate (TESSALON) 200 MG capsule Take 1 capsule (200 mg total) by mouth 3 (three) times daily as needed for cough. 09/22/22   Merrilee Jansky, MD  fluticasone-salmeterol (ADVAIR DISKUS) 100-50 MCG/ACT AEPB Inhale 1 puff into the lungs 2 (two) times daily. 09/17/22   Willette Cluster, MD  nicotine (NICODERM CQ - DOSED IN MG/24 HOURS) 14 mg/24hr patch Place 1 patch (14 mg total) onto the skin daily. 09/17/22   Willette Cluster, MD  Tiotropium Bromide Monohydrate (SPIRIVA RESPIMAT) 1.25 MCG/ACT AERS Inhale 2 puffs into the lungs daily. 09/17/22 10/17/22  Willette Cluster, MD      Allergies    Patient has no known allergies.    Review of Systems   Review of Systems  Physical Exam Updated Vital Signs BP 120/75   Pulse 89   Temp 98.1 F (36.7 C)    Resp 16   Ht 5\' 8"  (1.727 m)   Wt 66.7 kg   SpO2 94%   BMI 22.36 kg/m  Physical Exam Vitals and nursing note reviewed.  Constitutional:      General: He is not in acute distress.    Appearance: He is well-developed.  HENT:     Head: Normocephalic and atraumatic.  Eyes:     Conjunctiva/sclera: Conjunctivae normal.  Cardiovascular:     Rate and Rhythm: Normal rate and regular rhythm.     Heart sounds: No murmur heard. Pulmonary:     Effort: Pulmonary effort is normal. No respiratory distress.     Breath sounds: Normal breath sounds.  Abdominal:     Palpations: Abdomen is soft.     Tenderness: There is no abdominal tenderness.  Musculoskeletal:        General: No swelling.     Cervical back: Neck supple.  Skin:    General: Skin is warm and dry.     Capillary Refill: Capillary refill takes less than 2 seconds.  Neurological:     Mental Status: He is alert and oriented to person, place, and time.     ED Results / Procedures / Treatments   Labs (all labs ordered are listed, but only abnormal results are displayed) Labs Reviewed  COMPREHENSIVE METABOLIC PANEL - Abnormal; Notable for the following components:      Result Value   Glucose, Bld 128 (*)    Albumin 3.4 (*)  All other components within normal limits  ETHANOL - Abnormal; Notable for the following components:   Alcohol, Ethyl (B) 251 (*)    All other components within normal limits  CBC WITH DIFFERENTIAL/PLATELET - Abnormal; Notable for the following components:   RBC 3.71 (*)    Hemoglobin 11.5 (*)    HCT 34.8 (*)    Eosinophils Absolute 0.8 (*)    All other components within normal limits  RAPID URINE DRUG SCREEN, HOSP PERFORMED    EKG EKG Interpretation Date/Time:  Monday November 17 2022 01:22:36 EDT Ventricular Rate:  83 PR Interval:  160 QRS Duration:  99 QT Interval:  378 QTC Calculation: 445 R Axis:   83  Text Interpretation: Sinus rhythm No significant change since last tracing Confirmed by  Zadie Rhine ([id number]) on 11/17/2022 1:40:39 AM  Radiology No results found.  Procedures Procedures    Medications Ordered in ED Medications  sodium chloride 0.9 % bolus 1,000 mL (0 mLs Intravenous Stopped 11/17/22 0310)    ED Course/ Medical Decision Making/ A&P                             Medical Decision Making Amount and/or Complexity of Data Reviewed Labs: ordered.   Patient sent for medical clearance workup.  I ordered medical clearance labs.  Pertinent results include alcohol level of 251.  CMP and CBC appear grossly at baseline.  Patient was administered 1 L of normal saline.  Upon reassessment the patient continued to feel well.  CIWA score of 6.  No signs of significant active withdrawal at this time.  Patient appears reasonably medically cleared for return to [facility].  I discussed with the night APP at the facility who agreed that patient could return at this time.  Discharge back to behavioral health.        Final Clinical Impression(s) / ED Diagnoses Final diagnoses:  Alcoholic intoxication without complication (HCC)  Suicidal ideation    Rx / DC Orders ED Discharge Orders     None         Pamala Duffel 11/17/22 0355    Zadie Rhine, MD 11/17/22 [phone]

## 2022-11-17 NOTE — Progress Notes (Signed)
Patient has returned to the [facility] after being sent out to [facility] ED  for medical clearance due to blood alcohol of 350. Patient received liter of fluids and was medically cleared. Patient is alert oriented x4, calm and cooperative, speech is clear and coherent and thought process is logical and goals directed. Patient recommended  for [facility] for alcohol detox but it is full and patient will be admitted to [facility] continuous assessment.

## 2022-11-17 NOTE — ED Notes (Signed)
Report called to ED.  Non-emergent ems on the way to transport pt.

## 2022-11-17 NOTE — ED Notes (Signed)
Pt was given dinner. 

## 2022-11-17 NOTE — ED Triage Notes (Signed)
Pt BIB EMS from [facility] for alcohol intoxication, pt reports having suicidal ideations upon arrival to ED. O2 88% on RA on EMS arrival. 96% 2LNC.
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
## 2022-11-18 NOTE — ED Notes (Signed)
CIWA=0. Pt not experiencing any withdrawal symptoms at this time.
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
## 2022-11-18 NOTE — ED Provider Notes (Signed)
Behavioral Health Progress Note  Date and Time: 11/18/2022 11:29 AM Name: Gary Mclaughlin MRN:  [id number]  Subjective: Hypertensive to 131/99.  No acute events overnight.  Labs significant for: Hgb 11.5 L, EtOH 251 H.  EKG NSR. Qtc 445.  On day 2 of Ativan taper.  PRNs: Trazodone 50 mg x 1.  Last CIWAs: 0, 1, 0.  On interview, patient described mood as "improving".  Denied feeling anxiety, night sweats.  He says that this is "this smoothest withdrawal I've experienced."  Patient described feeling overwhelmingly tired, and that this was unusual for him.  He has received 4 doses of 1 mg Ativan over the past 24 hours as well as 50 mg trazodone -- we discussed how these medications could contribute to this feeling, but encouraged patient to report any further symptoms.  Patient said that he had undergone a "mini stroke" 1 month ago and experienced transient aphasia, which is fully resolved.  He did not receive any head imaging at that time.  He also says that he is not been eating as well and that he does fine with "a beer and a cigarette."  Denied symptoms of vitamin deficiency: including unsteadiness and bilateral lower limb neuropathic pain.  Denies SI, HI, and AVH.  Said that he was eligible for up to an apartment on Medicaid potentially.  Denies GI symptoms  Diagnosis:  Final diagnoses:  ETOH abuse    Total Time spent with patient: 15 minutes  Past Psychiatric History: EtOH abuse.   Past Medical History: Subarachnoid hemorrhage, tobacco abuse, hepatic steatosis, transaminatis, COPD Family History: N/a Social History: Homeless since 2012 and lives in a tent. Unemployed, last job in [employer]. Has family a brother and son in [place]. Mom is still alive in [place]. No family support nearby.   Additional Social History:                         Sleep: Good  Appetite:  Fair  Current Medications:  Current Facility-Administered Medications  Medication Dose Route Frequency Provider  Last Rate Last Admin   acetaminophen (TYLENOL) tablet 650 mg  650 mg Oral Q6H PRN Peterson Ao, MD       alum & mag hydroxide-simeth (MAALOX/MYLANTA) 200-200-20 MG/5ML suspension 30 mL  30 mL Oral Q4H PRN Peterson Ao, MD       hydrOXYzine (ATARAX) tablet 25 mg  25 mg Oral Q6H PRN Peterson Ao, MD       loperamide (IMODIUM) capsule 2-4 mg  2-4 mg Oral PRN Peterson Ao, MD       LORazepam (ATIVAN) tablet 1 mg  1 mg Oral QID Peterson Ao, MD   1 mg at 11/18/22 8295   Followed by   LORazepam (ATIVAN) tablet 1 mg  1 mg Oral TID Peterson Ao, MD       Followed by   Melene Muller ON 11/19/2022] LORazepam (ATIVAN) tablet 1 mg  1 mg Oral BID Peterson Ao, MD       Followed by   Melene Muller ON 11/21/2022] LORazepam (ATIVAN) tablet 1 mg  1 mg Oral Daily Peterson Ao, MD       LORazepam (ATIVAN) tablet 1 mg  1 mg Oral Q6H PRN Peterson Ao, MD       magnesium hydroxide (MILK OF MAGNESIA) suspension 30 mL  30 mL Oral Daily PRN Peterson Ao, MD       multivitamin with minerals tablet 1 tablet  1 tablet Oral Daily Peterson Ao, MD  1 tablet at 11/18/22 0916   ondansetron (ZOFRAN-ODT) disintegrating tablet 4 mg  4 mg Oral Q6H PRN Peterson Ao, MD       thiamine (VITAMIN B1) tablet 100 mg  100 mg Oral Daily Peterson Ao, MD   100 mg at 11/18/22 0916   traZODone (DESYREL) tablet 50 mg  50 mg Oral QHS PRN Peterson Ao, MD   50 mg at 11/17/22 2127   No current outpatient medications on file.    Labs  Lab Results:  Admission on 11/17/2022, Discharged on 11/17/2022  Component Date Value Ref Range Status   Sodium 11/17/2022 141  135 - 145 mmol/L Final   Potassium 11/17/2022 3.5  3.5 - 5.1 mmol/L Final   Chloride 11/17/2022 107  98 - 111 mmol/L Final   CO2 11/17/2022 22  22 - 32 mmol/L Final   Glucose, Bld 11/17/2022 128 (H)  70 - 99 mg/dL Final   Glucose reference range applies only to samples taken after fasting for at least 8 hours.   BUN 11/17/2022 8  6 - 20 mg/dL Final    Creatinine, Ser 11/17/2022 0.78  0.61 - 1.24 mg/dL Final   Calcium 95/01/3266 9.1  8.9 - 10.3 mg/dL Final   Total Protein 12/45/8099 6.7  6.5 - 8.1 g/dL Final   Albumin 83/38/2505 3.4 (L)  3.5 - 5.0 g/dL Final   AST 39/76/7341 32  15 - 41 U/L Final   ALT 11/17/2022 25  0 - 44 U/L Final   Alkaline Phosphatase 11/17/2022 74  38 - 126 U/L Final   Total Bilirubin 11/17/2022 0.3  0.3 - 1.2 mg/dL Final   GFR, Estimated 11/17/2022 >60  >60 mL/min Final   Comment: (NOTE) Calculated using the CKD-EPI Creatinine Equation (2021)    Anion gap 11/17/2022 12  5 - 15 Final   Performed at [facility] Lab, [address], [place], [place] [address]   Alcohol, Ethyl (B) 11/17/2022 251 (H)  <10 mg/dL Final   Comment: (NOTE) Lowest detectable limit for serum alcohol is 10 mg/dL.  For medical purposes only. Performed at [facility] Lab, [address], [place], [place] [address]    WBC 11/17/2022 5.6  4.0 - 10.5 K/uL Final   RBC 11/17/2022 3.71 (L)  4.22 - 5.81 MIL/uL Final   Hemoglobin 11/17/2022 11.5 (L)  13.0 - 17.0 g/dL Final   HCT 35/32/9924 34.8 (L)  39.0 - 52.0 % Final   MCV 11/17/2022 93.8  80.0 - 100.0 fL Final   MCH 11/17/2022 31.0  26.0 - 34.0 pg Final   MCHC 11/17/2022 33.0  30.0 - 36.0 g/dL Final   RDW 26/83/4196 12.1  11.5 - 15.5 % Final   Platelets 11/17/2022 290  150 - 400 K/uL Final   nRBC 11/17/2022 0.0  0.0 - 0.2 % Final   Neutrophils Relative % 11/17/2022 41  % Final   Neutro Abs 11/17/2022 2.3  1.7 - 7.7 K/uL Final   Lymphocytes Relative 11/17/2022 37  % Final   Lymphs Abs 11/17/2022 2.1  0.7 - 4.0 K/uL Final   Monocytes Relative 11/17/2022 5  % Final   Monocytes Absolute 11/17/2022 0.3  0.1 - 1.0 K/uL Final   Eosinophils Relative 11/17/2022 15  % Final   Eosinophils Absolute 11/17/2022 0.8 (H)  0.0 - 0.5 K/uL Final   Basophils Relative 11/17/2022 2  % Final   Basophils Absolute 11/17/2022 0.1  0.0 - 0.1 K/uL Final   Immature Granulocytes 11/17/2022 0  %  Final   Abs  Immature Granulocytes 11/17/2022 0.01  0.00 - 0.07 K/uL Final   Performed at [facility] Lab, [address], [place], [place] [address]  Admission on 11/16/2022, Discharged on 11/17/2022  Component Date Value Ref Range Status   WBC 11/16/2022 6.0  4.0 - 10.5 K/uL Final   RBC 11/16/2022 3.87 (L)  4.22 - 5.81 MIL/uL Final   Hemoglobin 11/16/2022 11.8 (L)  13.0 - 17.0 g/dL Final   HCT 41/32/4401 35.6 (L)  39.0 - 52.0 % Final   MCV 11/16/2022 92.0  80.0 - 100.0 fL Final   MCH 11/16/2022 30.5  26.0 - 34.0 pg Final   MCHC 11/16/2022 33.1  30.0 - 36.0 g/dL Final   RDW 02/72/5366 12.4  11.5 - 15.5 % Final   Platelets 11/16/2022 325  150 - 400 K/uL Final   nRBC 11/16/2022 0.0  0.0 - 0.2 % Final   Neutrophils Relative % 11/16/2022 43  % Final   Neutro Abs 11/16/2022 2.6  1.7 - 7.7 K/uL Final   Lymphocytes Relative 11/16/2022 37  % Final   Lymphs Abs 11/16/2022 2.2  0.7 - 4.0 K/uL Final   Monocytes Relative 11/16/2022 5  % Final   Monocytes Absolute 11/16/2022 0.3  0.1 - 1.0 K/uL Final   Eosinophils Relative 11/16/2022 13  % Final   Eosinophils Absolute 11/16/2022 0.8 (H)  0.0 - 0.5 K/uL Final   Basophils Relative 11/16/2022 2  % Final   Basophils Absolute 11/16/2022 0.1  0.0 - 0.1 K/uL Final   Immature Granulocytes 11/16/2022 0  % Final   Abs Immature Granulocytes 11/16/2022 0.01  0.00 - 0.07 K/uL Final   Performed at [facility] Lab, [address], [place], [place] [address]   Sodium 11/16/2022 141  135 - 145 mmol/L Final   Potassium 11/16/2022 4.1  3.5 - 5.1 mmol/L Final   Chloride 11/16/2022 107  98 - 111 mmol/L Final   CO2 11/16/2022 23  22 - 32 mmol/L Final   Glucose, Bld 11/16/2022 96  70 - 99 mg/dL Final   Glucose reference range applies only to samples taken after fasting for at least 8 hours.   BUN 11/16/2022 8  6 - 20 mg/dL Final   Creatinine, Ser 11/16/2022 0.80  0.61 - 1.24 mg/dL Final   Calcium 74/25/9563 9.5  8.9 - 10.3 mg/dL Final   Total Protein 87/56/4332 6.9  6.5 -  8.1 g/dL Final   Albumin 95/18/8416 3.8  3.5 - 5.0 g/dL Final   AST 60/63/0160 32  15 - 41 U/L Final   ALT 11/16/2022 27  0 - 44 U/L Final   Alkaline Phosphatase 11/16/2022 79  38 - 126 U/L Final   Total Bilirubin 11/16/2022 0.4  0.3 - 1.2 mg/dL Final   GFR, Estimated 11/16/2022 >60  >60 mL/min Final   Comment: (NOTE) Calculated using the CKD-EPI Creatinine Equation (2021)    Anion gap 11/16/2022 11  5 - 15 Final   Performed at [facility] Lab, [address] [place], [place] [address]   Alcohol, Ethyl (B) 11/16/2022 350 (HH)  <10 mg/dL Final   Comment: CRITICAL RESULT CALLED TO, READ BACK BY AND VERIFIED WITH Clearnce Hasten, RN. [phone] 11/16/22. LPAIT (NOTE) Lowest detectable limit for serum alcohol is 10 mg/dL.  For medical purposes only. Performed at [facility] Lab, [address], [place], [place] [address]    Cholesterol 11/16/2022 209 (H)  0 - 200 mg/dL Final  Triglycerides 11/16/2022 134  <150 mg/dL Final   HDL 91/47/8295 55  >40 mg/dL Final   Total CHOL/HDL Ratio 11/16/2022 3.8  RATIO Final   VLDL 11/16/2022 27  0 - 40 mg/dL Final   LDL Cholesterol 11/16/2022 127 (H)  0 - 99 mg/dL Final   Comment:        Total Cholesterol/HDL:CHD Risk Coronary Heart Disease Risk Table                     Men   Women  1/2 Average Risk   3.4   3.3  Average Risk       5.0   4.4  2 X Average Risk   9.6   7.1  3 X Average Risk  23.4   11.0        Use the calculated Patient Ratio above and the CHD Risk Table to determine the patient's CHD Risk.        ATP III CLASSIFICATION (LDL):  <100     mg/dL   Optimal  621-308  mg/dL   Near or Above                    Optimal  130-159  mg/dL   Borderline  657-846  mg/dL   High  >962     mg/dL   Very High Performed at [facility] Lab, [address], [place], [place] [address]    POC Amphetamine UR 11/16/2022 None Detected  NONE DETECTED (Cut Off Level 1000 ng/mL) Final   POC Secobarbital (BAR) 11/16/2022 None Detected  NONE DETECTED (Cut  Off Level 300 ng/mL) Final   POC Buprenorphine (BUP) 11/16/2022 None Detected  NONE DETECTED (Cut Off Level 10 ng/mL) Final   POC Oxazepam (BZO) 11/16/2022 Positive (A)  NONE DETECTED (Cut Off Level 300 ng/mL) Final   POC Cocaine UR 11/16/2022 None Detected  NONE DETECTED (Cut Off Level 300 ng/mL) Final   POC Methamphetamine UR 11/16/2022 None Detected  NONE DETECTED (Cut Off Level 1000 ng/mL) Final   POC Morphine 11/16/2022 None Detected  NONE DETECTED (Cut Off Level 300 ng/mL) Final   POC Methadone UR 11/16/2022 None Detected  NONE DETECTED (Cut Off Level 300 ng/mL) Final   POC Oxycodone UR 11/16/2022 None Detected  NONE DETECTED (Cut Off Level 100 ng/mL) Final   POC Marijuana UR 11/16/2022 None Detected  NONE DETECTED (Cut Off Level 50 ng/mL) Final  Admission on 09/20/2022, Discharged on 09/20/2022  Component Date Value Ref Range Status   Sodium 09/20/2022 133 (L)  135 - 145 mmol/L Final   Potassium 09/20/2022 3.9  3.5 - 5.1 mmol/L Final   Chloride 09/20/2022 98  98 - 111 mmol/L Final   CO2 09/20/2022 23  22 - 32 mmol/L Final   Glucose, Bld 09/20/2022 107 (H)  70 - 99 mg/dL Final   Glucose reference range applies only to samples taken after fasting for at least 8 hours.   BUN 09/20/2022 15  6 - 20 mg/dL Final   Creatinine, Ser 09/20/2022 0.85  0.61 - 1.24 mg/dL Final   Calcium 13/24/4010 10.1  8.9 - 10.3 mg/dL Final   GFR, Estimated 09/20/2022 >60  >60 mL/min Final   Comment: (NOTE) Calculated using the CKD-EPI Creatinine Equation (2021)    Anion gap 09/20/2022 12  5 - 15 Final   Performed at [facility] Lab, [address], [place], [place] [address]   WBC 09/20/2022 9.2  4.0 - 10.5 K/uL  Final   RBC 09/20/2022 3.05 (L)  4.22 - 5.81 MIL/uL Final   Hemoglobin 09/20/2022 9.9 (L)  13.0 - 17.0 g/dL Final   HCT 29/56/2130 30.6 (L)  39.0 - 52.0 % Final   MCV 09/20/2022 100.3 (H)  80.0 - 100.0 fL Final   MCH 09/20/2022 32.5  26.0 - 34.0 pg Final   MCHC 09/20/2022 32.4  30.0 - 36.0 g/dL  Final   RDW 86/57/8469 13.6  11.5 - 15.5 % Final   Platelets 09/20/2022 608 (H)  150 - 400 K/uL Final   nRBC 09/20/2022 0.0  0.0 - 0.2 % Final   Performed at [facility] Lab, [address], [place], [place] [address]   Troponin I (High Sensitivity) 09/20/2022 6  <18 ng/L Final   Comment: (NOTE) Elevated high sensitivity troponin I (hsTnI) values and significant  changes across serial measurements may suggest ACS but many other  chronic and acute conditions are known to elevate hsTnI results.  Refer to the "Links" section for chest pain algorithms and additional  guidance. Performed at [facility] Lab, [address], [place], [place] [address]    Troponin I (High Sensitivity) 09/20/2022 6  <18 ng/L Final   Comment: (NOTE) Elevated high sensitivity troponin I (hsTnI) values and significant  changes across serial measurements may suggest ACS but many other  chronic and acute conditions are known to elevate hsTnI results.  Refer to the "Links" section for chest pain algorithms and additional  guidance. Performed at [facility] Lab, [address], [place], [place] [address]   Admission on 09/14/2022, Discharged on 09/17/2022  Component Date Value Ref Range Status   Sodium 09/14/2022 127 (L)  135 - 145 mmol/L Final   Potassium 09/14/2022 4.0  3.5 - 5.1 mmol/L Final   Chloride 09/14/2022 89 (L)  98 - 111 mmol/L Final   CO2 09/14/2022 22  22 - 32 mmol/L Final   Glucose, Bld 09/14/2022 94  70 - 99 mg/dL Final   Glucose reference range applies only to samples taken after fasting for at least 8 hours.   BUN 09/14/2022 9  6 - 20 mg/dL Final   Creatinine, Ser 09/14/2022 0.84  0.61 - 1.24 mg/dL Final   Calcium 27/25/3664 9.7  8.9 - 10.3 mg/dL Final   GFR, Estimated 09/14/2022 >60  >60 mL/min Final   Comment: (NOTE) Calculated using the CKD-EPI Creatinine Equation (2021)    Anion gap 09/14/2022 16 (H)  5 - 15 Final   Performed at [facility] Lab, [address]  [place], [place] [address]   WBC 09/14/2022 11.9 (H)  4.0 - 10.5 K/uL Final   RBC 09/14/2022 3.65 (L)  4.22 - 5.81 MIL/uL Final   Hemoglobin 09/14/2022 12.1 (L)  13.0 - 17.0 g/dL Final   HCT 42/59/5638 34.7 (L)  39.0 - 52.0 % Final   MCV 09/14/2022 95.1  80.0 - 100.0 fL Final   MCH 09/14/2022 33.2  26.0 - 34.0 pg Final   MCHC 09/14/2022 34.9  30.0 - 36.0 g/dL Final   RDW 75/64/3329 12.9  11.5 - 15.5 % Final   Platelets 09/14/2022 300  150 - 400 K/uL Final   nRBC 09/14/2022 0.0  0.0 - 0.2 % Final   Performed at [facility] Lab, [address], [place], [place] [address]   Troponin I (High Sensitivity) 09/14/2022 118 (HH)  <18 ng/L Final   Comment: CRITICAL RESULT CALLED TO, READ BACK BY AND VERIFIED WITH C  [facility] RN AT [phone] BY D LONG (NOTE) Elevated high sensitivity troponin I (hsTnI) values and significant  changes across serial measurements may suggest ACS but many other  chronic and acute conditions are known to elevate hsTnI results.  Refer to the "Links" section for chest pain algorithms and additional  guidance. Performed at [facility] Lab, [address] [place], [place] [address]    Alcohol, Ethyl (B) 09/14/2022 <10  <10 mg/dL Final   Comment: (NOTE) Lowest detectable limit for serum alcohol is 10 mg/dL.  For medical purposes only. Performed at [facility] Lab, [address], [place], [place] [address]    Opiates 09/14/2022 NONE DETECTED  NONE DETECTED Final   Cocaine 09/14/2022 NONE DETECTED  NONE DETECTED Final   Benzodiazepines 09/14/2022 NONE DETECTED  NONE DETECTED Final   Amphetamines 09/14/2022 NONE DETECTED  NONE DETECTED Final   Tetrahydrocannabinol 09/14/2022 POSITIVE (A)  NONE DETECTED Final   Barbiturates 09/14/2022 NONE DETECTED  NONE DETECTED Final   Comment: (NOTE) DRUG SCREEN FOR MEDICAL PURPOSES ONLY.  IF CONFIRMATION IS NEEDED FOR ANY PURPOSE, NOTIFY LAB WITHIN 5 DAYS.  LOWEST DETECTABLE LIMITS FOR URINE DRUG SCREEN Drug Class                      Cutoff (ng/mL) Amphetamine and metabolites    1000 Barbiturate and metabolites    200 Benzodiazepine                 200 Opiates and metabolites        300 Cocaine and metabolites        300 THC                            50 Performed at [facility] Lab, [address], [place], [place] [address]    D-Dimer, Quant 09/14/2022 2.46 (H)  0.00 - 0.50 ug/mL-FEU Final   Comment: (NOTE) At the manufacturer cut-off value of 0.5 g/mL FEU, this assay has a negative predictive value of 95-100%.This assay is intended for use in conjunction with a clinical pretest probability (PTP) assessment model to exclude pulmonary embolism (PE) and deep venous thrombosis (DVT) in outpatients suspected of PE or DVT. Results should be correlated with clinical presentation. Performed at [facility] Lab, [address] [place], [place] [address]    Troponin I (High Sensitivity) 09/14/2022 17  <18 ng/L Final   Comment: DELTA CHECK NOTED (NOTE) Elevated high sensitivity troponin I (hsTnI) values and significant  changes across serial measurements may suggest ACS but many other  chronic and acute conditions are known to elevate hsTnI results.  Refer to the "Links" section for chest pain algorithms and additional  guidance. Performed at [facility] Lab, [address], [place], [place] [address]    Adenovirus 09/14/2022 NOT DETECTED  NOT DETECTED Final   Coronavirus 229E 09/14/2022 NOT DETECTED  NOT DETECTED Final   Comment: (NOTE) The Coronavirus on the Respiratory Panel, DOES NOT test for the novel  Coronavirus (2019 nCoV)    Coronavirus HKU1 09/14/2022 NOT DETECTED  NOT DETECTED Final   Coronavirus NL63 09/14/2022 NOT DETECTED  NOT DETECTED Final   Coronavirus OC43 09/14/2022 NOT DETECTED  NOT DETECTED Final   Metapneumovirus 09/14/2022 NOT DETECTED  NOT DETECTED Final   Rhinovirus / Enterovirus 09/14/2022 NOT DETECTED  NOT DETECTED Final   Influenza A 09/14/2022 NOT DETECTED  NOT  DETECTED Final  Influenza B 09/14/2022 NOT DETECTED  NOT DETECTED Final   Parainfluenza Virus 1 09/14/2022 NOT DETECTED  NOT DETECTED Final   Parainfluenza Virus 2 09/14/2022 NOT DETECTED  NOT DETECTED Final   Parainfluenza Virus 3 09/14/2022 NOT DETECTED  NOT DETECTED Final   Parainfluenza Virus 4 09/14/2022 NOT DETECTED  NOT DETECTED Final   Respiratory Syncytial Virus 09/14/2022 NOT DETECTED  NOT DETECTED Final   Bordetella pertussis 09/14/2022 NOT DETECTED  NOT DETECTED Final   Bordetella Parapertussis 09/14/2022 NOT DETECTED  NOT DETECTED Final   Chlamydophila pneumoniae 09/14/2022 NOT DETECTED  NOT DETECTED Final   Mycoplasma pneumoniae 09/14/2022 NOT DETECTED  NOT DETECTED Final   Performed at [facility] Lab, [address], [place], [place] [address]   SARS Coronavirus 2 by RT PCR 09/14/2022 NEGATIVE  NEGATIVE Final   Performed at [facility] Lab, [address] [place], [place] [address]   Sodium 09/15/2022 129 (L)  135 - 145 mmol/L Final   Potassium 09/15/2022 3.2 (L)  3.5 - 5.1 mmol/L Final   Chloride 09/15/2022 95 (L)  98 - 111 mmol/L Final   CO2 09/15/2022 26  22 - 32 mmol/L Final   Glucose, Bld 09/15/2022 94  70 - 99 mg/dL Final   Glucose reference range applies only to samples taken after fasting for at least 8 hours.   BUN 09/15/2022 10  6 - 20 mg/dL Final   Creatinine, Ser 09/15/2022 0.76  0.61 - 1.24 mg/dL Final   Calcium 09/81/1914 9.0  8.9 - 10.3 mg/dL Final   Total Protein 78/29/5621 6.4 (L)  6.5 - 8.1 g/dL Final   Albumin 30/86/5784 2.6 (L)  3.5 - 5.0 g/dL Final   AST 69/62/9528 55 (H)  15 - 41 U/L Final   ALT 09/15/2022 42  0 - 44 U/L Final   Alkaline Phosphatase 09/15/2022 112  38 - 126 U/L Final   Total Bilirubin 09/15/2022 0.8  0.3 - 1.2 mg/dL Final   GFR, Estimated 09/15/2022 >60  >60 mL/min Final   Comment: (NOTE) Calculated using the CKD-EPI Creatinine Equation (2021)    Anion gap 09/15/2022 8  5 - 15 Final   Performed at [facility]  Lab, [address], [place], [place] [address]   WBC 09/15/2022 8.0  4.0 - 10.5 K/uL Final   RBC 09/15/2022 3.10 (L)  4.22 - 5.81 MIL/uL Final   Hemoglobin 09/15/2022 10.4 (L)  13.0 - 17.0 g/dL Final   HCT 40/02/2724 30.0 (L)  39.0 - 52.0 % Final   MCV 09/15/2022 96.8  80.0 - 100.0 fL Final   MCH 09/15/2022 33.5  26.0 - 34.0 pg Final   MCHC 09/15/2022 34.7  30.0 - 36.0 g/dL Final   RDW 36/64/4034 13.2  11.5 - 15.5 % Final   Platelets 09/15/2022 302  150 - 400 K/uL Final   nRBC 09/15/2022 0.0  0.0 - 0.2 % Final   Performed at [facility] Lab, [address] [place], [place] [address]   Osmolality, Ur 09/14/2022 495  300 - 900 mOsm/kg Final   Performed at [facility], [address] [place], [place] [address]   Sodium, Ur 09/14/2022 <10  mmol/L Final   Performed at [facility], [address], [place], [place] [address]   Vitamin B-12 09/15/2022 518  180 - 914 pg/mL Final   Comment: (NOTE) This assay is not validated for testing neonatal or myeloproliferative syndrome specimens for Vitamin B12 levels. Performed at  [facility] Lab, [address] [place], [place], [place] [address]    Folate 09/15/2022 15.3  >5.9 ng/mL Final   Performed at [facility] Lab, [address] [place], [place] [address]   Magnesium 09/15/2022 2.0  1.7 - 2.4 mg/dL Final   Performed at [facility] Lab, [address], [place], [place] [address]   MRSA by PCR Next Gen 09/15/2022 NOT DETECTED  NOT DETECTED Final   Comment: (NOTE) The GeneXpert MRSA Assay (FDA approved for NASAL specimens only), is one component of a comprehensive MRSA colonization surveillance program. It is not intended to diagnose MRSA infection nor to guide or monitor treatment for MRSA infections. Test performance is not FDA approved in patients less than 65 years old. Performed at [facility] Lab, [address] [place], [place] [address]    Sodium 09/15/2022 131 (L)  135 - 145 mmol/L Final   Potassium 09/15/2022  4.1  3.5 - 5.1 mmol/L Final   Chloride 09/15/2022 94 (L)  98 - 111 mmol/L Final   CO2 09/15/2022 27  22 - 32 mmol/L Final   Glucose, Bld 09/15/2022 178 (H)  70 - 99 mg/dL Final   Glucose reference range applies only to samples taken after fasting for at least 8 hours.   BUN 09/15/2022 12  6 - 20 mg/dL Final   Creatinine, Ser 09/15/2022 0.80  0.61 - 1.24 mg/dL Final   Calcium 25/95/6387 9.5  8.9 - 10.3 mg/dL Final   GFR, Estimated 09/15/2022 >60  >60 mL/min Final   Comment: (NOTE) Calculated using the CKD-EPI Creatinine Equation (2021)    Anion gap 09/15/2022 10  5 - 15 Final   Performed at [facility] Lab, [address], [place], [place] [address]   Sodium 09/16/2022 129 (L)  135 - 145 mmol/L Final   Potassium 09/16/2022 3.9  3.5 - 5.1 mmol/L Final   Chloride 09/16/2022 96 (L)  98 - 111 mmol/L Final   CO2 09/16/2022 24  22 - 32 mmol/L Final   Glucose, Bld 09/16/2022 207 (H)  70 - 99 mg/dL Final   Glucose reference range applies only to samples taken after fasting for at least 8 hours.   BUN 09/16/2022 14  6 - 20 mg/dL Final   Creatinine, Ser 09/16/2022 0.64  0.61 - 1.24 mg/dL Final   Calcium 29/51/8841 9.6  8.9 - 10.3 mg/dL Final   GFR, Estimated 09/16/2022 >60  >60 mL/min Final   Comment: (NOTE) Calculated using the CKD-EPI Creatinine Equation (2021)    Anion gap 09/16/2022 9  5 - 15 Final   Performed at [facility] Lab, [address] [place], [place] [address]   Magnesium 09/16/2022 1.6 (L)  1.7 - 2.4 mg/dL Final   Performed at [facility] Lab, [address], [place], [place] [address]   Phosphorus 09/16/2022 2.1 (L)  2.5 - 4.6 mg/dL Final   Performed at [facility], [address], [place], [place] [address]   Sodium 09/17/2022 133 (L)  135 - 145 mmol/L Final   Potassium 09/17/2022 3.7  3.5 - 5.1 mmol/L Final   Chloride 09/17/2022 97 (L)  98 - 111 mmol/L Final   CO2 09/17/2022 26  22 - 32 mmol/L Final   Glucose, Bld 09/17/2022 161 (H)  70 - 99 mg/dL Final    Glucose reference range applies only to samples taken after fasting for at least 8 hours.   BUN 09/17/2022 12  6 -  20 mg/dL Final   Creatinine, Ser 09/17/2022 0.80  0.61 - 1.24 mg/dL Final   Calcium 91/47/8295 9.5  8.9 - 10.3 mg/dL Final   GFR, Estimated 09/17/2022 >60  >60 mL/min Final   Comment: (NOTE) Calculated using the CKD-EPI Creatinine Equation (2021)    Anion gap 09/17/2022 10  5 - 15 Final   Performed at [facility], [address], [place], [place] [address]  Admission on 08/31/2022, Discharged on 08/31/2022  Component Date Value Ref Range Status   Sodium 08/31/2022 135  135 - 145 mmol/L Final   Potassium 08/31/2022 4.2  3.5 - 5.1 mmol/L Final   HEMOLYSIS AT THIS LEVEL MAY AFFECT RESULT   Chloride 08/31/2022 100  98 - 111 mmol/L Final   CO2 08/31/2022 24  22 - 32 mmol/L Final   Glucose, Bld 08/31/2022 134 (H)  70 - 99 mg/dL Final   Glucose reference range applies only to samples taken after fasting for at least 8 hours.   BUN 08/31/2022 9  6 - 20 mg/dL Final   Creatinine, Ser 08/31/2022 0.89  0.61 - 1.24 mg/dL Final   Calcium 86/57/8469 9.1  8.9 - 10.3 mg/dL Final   Total Protein 62/95/2841 6.8  6.5 - 8.1 g/dL Final   Albumin 32/44/0102 3.3 (L)  3.5 - 5.0 g/dL Final   AST 72/53/6644 78 (H)  15 - 41 U/L Final   HEMOLYSIS AT THIS LEVEL MAY AFFECT RESULT   ALT 08/31/2022 49 (H)  0 - 44 U/L Final   HEMOLYSIS AT THIS LEVEL MAY AFFECT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

## 2022-11-18 NOTE — ED Notes (Signed)
Pt asleep at this hour. No apparent distress. RR even and unlabored. Monitored for safety.  

## 2022-11-18 NOTE — ED Notes (Signed)
Patient has been awake and alert without issue or complaint.  He has been watching tv and has been pleasant on approach.  No evidence of withdrawal at [facility] time.  Will monitor.
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
## 2022-11-18 NOTE — ED Notes (Signed)
Patient awake and alert on unit without issue or complaint.  No distress.  No withdrawal.  Will monitor and provide a safe environment.

## 2022-11-19 NOTE — ED Notes (Signed)
Patient A&Ox4. Denies intent to harm self/others when asked. Denies A/VH. Patient denies any physical complaints when asked. No acute distress noted. Pt states, "this is the first time that I've detox this easy. I feel great". Praise and support provided. Routine safety checks conducted according to facility protocol. Encouraged patient to notify staff if thoughts of harm toward self or others arise. Patient verbalize understanding and agreement. Will continue to monitor for safety.
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
## 2022-11-19 NOTE — ED Provider Notes (Signed)
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   Comment: CRITICAL RESULT CALLED TO, READ BACK BY AND VERIFIED WITH C,CARTER RN @1714  08/31/22 E,BENTON (NOTE) Lowest detectable limit for serum alcohol is 10 mg/dL.  For medical purposes only. Performed at [facility], [address], [place], [place] [address]    WBC 08/31/2022 7.3  4.0 - 10.5 K/uL Final   RBC 08/31/2022 3.40 (L)  4.22  - 5.81 MIL/uL Final   Hemoglobin 08/31/2022 11.3 (L)  13.0 - 17.0 g/dL Final   HCT 66/44/0347 32.2 (L)  39.0 - 52.0 % Final   MCV 08/31/2022 94.7  80.0 - 100.0 fL Final   MCH 08/31/2022 33.2  26.0 - 34.0 pg Final   MCHC 08/31/2022 35.1  30.0 - 36.0 g/dL Final   RDW 42/59/5638 13.5  11.5 - 15.5 % Final   Platelets 08/31/2022 295  150 - 400 K/uL Final   nRBC 08/31/2022 0.0  0.0 - 0.2 % Final   Performed at [facility], [address] [place], [place] [address]    Blood Alcohol level:  Lab Results  Component Value Date   ETH 251 (H) 11/17/2022   ETH 350 (HH) 11/16/2022    Metabolic Disorder Labs: Lab Results  Component Value Date   HGBA1C 5.7 (H) 05/02/2021   MPG 116.89 05/02/2021   No results found for: "PROLACTIN" Lab Results  Component Value Date   CHOL 209 (H) 11/16/2022   TRIG 134 11/16/2022   HDL 55 11/16/2022   CHOLHDL 3.8 11/16/2022   VLDL 27 11/16/2022   LDLCALC 127 (H) 11/16/2022    Therapeutic Lab Levels: No results found for: "LITHIUM" No results found for: "VALPROATE" No  results found for: "CBMZ"  Physical Findings   AUDIT    Flowsheet Row Admission (Discharged) from 05/01/2014 in [facility]  Alcohol Use Disorder Identification Test Final Score (AUDIT) 32      CAGE-AID    Flowsheet Row ED to Hosp-Admission (Discharged) from 04/16/2022 in [facility]  CAGE-AID Score 3      PHQ2-9    Flowsheet Row ED from 11/17/2022 in [facility] ED from 05/02/2021 in [facility]  PHQ-2 Total Score 2 2  PHQ-9 Total Score 11 4      Flowsheet Row ED from 11/17/2022 in [facility] Most recent reading at 11/17/2022  4:34 PM ED from 11/17/2022 in [facility] Most recent reading at 11/17/2022  5:44 AM ED from 11/17/2022 in [facility] Department [facility] Most recent reading at  11/17/2022  1:00 AM  C-SSRS RISK CATEGORY No Risk Moderate Risk Moderate Risk        Musculoskeletal  Strength & Muscle Tone: within normal limits Gait & Station: normal Patient leans: N/A  Psychiatric Specialty Exam  Presentation  General Appearance:  Appropriate for Environment  Eye Contact: Fair  Speech: Normal Rate; Garbled  Speech Volume: Normal  Handedness: Right   Mood and Affect  Mood: Anxious; Euthymic  Affect: Appropriate; Congruent   Thought Process  Thought Processes: Coherent; Goal Directed  Descriptions of Associations:Circumstantial  Orientation:Full (Time, Place and Person)  Thought Content:Logical (Occasionally circumstantial)  Diagnosis of Schizophrenia or Schizoaffective disorder in past: No    Hallucinations:Hallucinations: None  Ideas of Reference:None  Suicidal Thoughts:Suicidal Thoughts: No  Homicidal Thoughts:Homicidal Thoughts: No   Sensorium  Memory: Immediate Fair  Judgment: Fair  Insight: Fair   Art therapist  Concentration: Fair  Attention Span: Fair  Recall: Fair  Fund of Knowledge: Fair  Language: Fair   Psychomotor Activity   Psychomotor Activity: Normal   Assets   Desire for Improvement   Sleep   Sleep: Good     Physical Exam  Physical Exam Vitals reviewed.  Constitutional:      General: He is not in acute distress.    Appearance: He is obese.  HENT:     Head: Normocephalic and atraumatic.  Eyes:     Extraocular Movements: Extraocular movements intact.  Pulmonary:     Effort: Pulmonary effort is normal.  Abdominal:     Palpations: Abdomen is soft.  Musculoskeletal:        General: Normal range of motion.  Skin:    General: Skin is warm and dry.  Neurological:     Mental Status: He is alert. Mental status is at baseline.  Psychiatric:        Mood and Affect: Mood normal.        Behavior: Behavior normal.        Thought Content: Thought content normal.     Review of Systems  Psychiatric/Behavioral:  Positive for substance abuse. Negative for hallucinations and suicidal ideas. The patient is nervous/anxious. The patient does not have insomnia.   All other systems reviewed and are negative.  Blood pressure 136/84, pulse 78, temperature 98.1 F (36.7 C), temperature source Oral, resp. rate 18, SpO2 98%. There is no height or weight on file to calculate BMI.  Treatment Plan Summary: Daily contact with patient to assess and evaluate symptoms and progress in treatment  Mr. Hilgers is a 52 year old male with a past  history of alcohol use disorder severe requiring long-term residential substance use treatment.  Patient today appears at baseline.  No new complaints.  Does not show any external signs of acute alcohol withdrawal.  Progressing on Ativan taper well.  Likely is that he will be able to be discharged after Ativan taper ends.  Social work will fax out patient information to various inpatient substance use treatments today.  This could be complicated by court date on 8/13.  Will consider starting naltrexone 50 mg for alcohol craving and setting of normal LFTs.  #EtOH Abuse #C/F substance-induced mood disorder - Patient chronically abuses alcohol, drinking ~ 6 40 oz beers daily  - Most recent detox was in the [facility] on May [facility]24  - Ativan taper currently in place, day 2 of 4. - Consider initiating naltrexone 50 mg tomorrow for alcohol craving. - PRNs ordered  - PHQ-9 of 11, will follow-up before discharge.  Tomie China, MD 11/19/2022 10:48 AM

## 2022-11-19 NOTE — ED Notes (Signed)
Pt sleeping@this time. Breathing even and unlabored. Will continue to monitor for safety 

## 2022-11-19 NOTE — ED Notes (Signed)
Pt is laying in bed calm and cooperative no pain or distress noted alert and orient x 4 will continue to monitor for safety

## 2022-11-19 NOTE — ED Notes (Addendum)
Pt laying in bed calm and coopeartive
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
## 2022-11-19 NOTE — ED Notes (Signed)
Pt viable in dayroom at this time. Pt has been visible in milieu and noted to be interacting appropriately.  Denies withdrawal or has any complaints at this time.  Q 15 min checks in place.

## 2022-11-19 NOTE — ED Notes (Signed)
Progress note   D: Pt seen at nurse's station. Pt denies SI, HI, AVH. Pt rates pain  0/10. Pt rates anxiety  0/10 and depression  0/10. Denies any withdrawal symptoms. "I'm almost scared that this detox has gone so smoothly." Pt encouraged to report any withdrawal symptoms that he notices. No other concerns noted at this time.  A: Pt provided support and encouragement. Pt given scheduled medication as prescribed. PRNs as appropriate. Q15 min checks for safety.   R: Pt safe on the unit. Will continue to monitor.

## 2022-11-19 NOTE — Progress Notes (Signed)
Patient pleasant and cooperative with care provided.  He expressed his gratitude for the kindness and help that he has received during his stay here,  He reports that he remains tired and is grateful for the opportunity to get "better sleep" since he has been here.  Patient was compliant with medication regimen as ordered.  He was encouraged to continue to drink fluids and to ask staff for any requests.

## 2022-11-19 NOTE — ED Notes (Signed)
Pt sleeping in no acute distress. RR even and unlabored. Environment secured. Will continue to monitor for safety. 

## 2022-11-19 NOTE — ED Notes (Signed)
Pt asleep at this hour. No apparent distress. RR even and unlabored. Monitored for safety.  
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
## 2022-11-20 MED ORDER — NALTREXONE HCL 50 MG PO TABS
50.0000 mg | ORAL_TABLET | Freq: Every day | ORAL | Status: DC
  Administered 2022-11-20 – 2022-11-21 (×2): 50 mg via ORAL
  Filled 2022-11-20 (×2): qty 1

## 2022-11-20 MED ORDER — HYDROXYZINE HCL 25 MG PO TABS: 25.0000 mg | ORAL_TABLET | Freq: Four times a day (QID) | ORAL | Status: DC | PRN

## 2022-11-20 MED ORDER — LORAZEPAM 1 MG PO TABS
1.0000 mg | ORAL_TABLET | Freq: Every day | ORAL | Status: DC
  Filled 2022-11-20: qty 1

## 2022-11-20 MED ORDER — LORAZEPAM 1 MG PO TABS
1.0000 mg | ORAL_TABLET | Freq: Every day | ORAL | Status: AC
  Administered 2022-11-20: 1 mg via ORAL

## 2022-11-20 NOTE — ED Notes (Signed)
Pt observed in the dayroom conversing with peers. Is calm and cooperative. Denies any needs at this moment. Will continue to monitor for safety.

## 2022-11-20 NOTE — ED Notes (Signed)
Patient sitting in dayroom watching television. Calm and cooperative. Denies any current needs. Will continue to monitor for safety.
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
## 2022-11-20 NOTE — ED Provider Notes (Signed)
Behavioral Health Progress Note  Date and Time: 11/20/2022 8:44 AM Name: Gary Mclaughlin MRN:  [id number]  Subjective: Vitals within normal limits.  No acute events overnight.  No new labs.  No refusals.  PRNs: Hydroxyzine 25 mg x 1, trazodone 50 mg x 1.  CIWAs: 1, 1, 0.  On day 3/4 Ativan taper.  On interview, patient feels "great. Ready to go."  He is interested in discharging with outpatient resources before self-presenting to [facility] -- patient mentioned picking up his birth certificate from [facility] along with coordinating legal matters.  Is interested in discharging tomorrow morning.  He says that he knows he can never drink again, and expresses his desire to "not [place]."  Patient agreeable to holding afternoon Ativan to monitor withdrawal symptoms while off Ativan taper.  Otherwise, he is slept okay and is eating okay.  Denies SI, HI, and AVH.  No new complaints.  Patient is amenable to starting naltrexone 50 mg to reduce alcohol cravings.  Diagnosis:  Final diagnoses:  ETOH abuse    Total Time spent with patient: 15 minutes  Past Psychiatric History: EtOH abuse.   Past Medical History: Subarachnoid hemorrhage, tobacco abuse, hepatic steatosis, transaminatis, COPD Family History: N/a Social History: Homeless since 2012 and lives in a tent. Unemployed, last job in [employer]. Has family a brother and son in [place]. Mom is still alive in [place]. No family support nearby.   Additional Social History:                         Sleep: Good  Appetite:  Fair  Current Medications:  Current Facility-Administered Medications  Medication Dose Route Frequency Provider Last Rate Last Admin   acetaminophen (TYLENOL) tablet 650 mg  650 mg Oral Q6H PRN Peterson Ao, MD   650 mg at 11/19/22 2316   alum & mag hydroxide-simeth (MAALOX/MYLANTA) 200-200-20 MG/5ML suspension 30 mL  30 mL Oral Q4H PRN Peterson Ao, MD       hydrOXYzine (ATARAX) tablet 25  mg  25 mg Oral Q6H PRN Tomie China, MD       LORazepam (ATIVAN) tablet 1 mg  1 mg Oral BID Peterson Ao, MD   1 mg at 11/19/22 2316   Followed by   Melene Muller ON 11/21/2022] LORazepam (ATIVAN) tablet 1 mg  1 mg Oral Daily Peterson Ao, MD       magnesium hydroxide (MILK OF MAGNESIA) suspension 30 mL  30 mL Oral Daily PRN Peterson Ao, MD       multivitamin with minerals tablet 1 tablet  1 tablet Oral Daily Peterson Ao, MD   1 tablet at 11/19/22 4098   thiamine (VITAMIN B1) tablet 100 mg  100 mg Oral Daily Peterson Ao, MD   100 mg at 11/19/22 1191   traZODone (DESYREL) tablet 50 mg  50 mg Oral QHS PRN Peterson Ao, MD   50 mg at 11/19/22 2316   No current outpatient medications on file.    Labs  Lab Results:  Admission on 11/17/2022, Discharged on 11/17/2022  Component Date Value Ref Range Status   Sodium 11/17/2022 141  135 - 145 mmol/L Final   Potassium 11/17/2022 3.5  3.5 - 5.1 mmol/L Final   Chloride 11/17/2022 107  98 - 111 mmol/L Final   CO2 11/17/2022 22  22 - 32 mmol/L Final   Glucose, Bld 11/17/2022 128 (H)  70 - 99 mg/dL Final   Glucose reference  range applies only to samples taken after fasting for at least 8 hours.   BUN 11/17/2022 8  6 - 20 mg/dL Final   Creatinine, Ser 11/17/2022 0.78  0.61 - 1.24 mg/dL Final   Calcium 16/02/9603 9.1  8.9 - 10.3 mg/dL Final   Total Protein 54/01/8118 6.7  6.5 - 8.1 g/dL Final   Albumin 14/78/2956 3.4 (L)  3.5 - 5.0 g/dL Final   AST 21/30/8657 32  15 - 41 U/L Final   ALT 11/17/2022 25  0 - 44 U/L Final   Alkaline Phosphatase 11/17/2022 74  38 - 126 U/L Final   Total Bilirubin 11/17/2022 0.3  0.3 - 1.2 mg/dL Final   GFR, Estimated 11/17/2022 >60  >60 mL/min Final   Comment: (NOTE) Calculated using the CKD-EPI Creatinine Equation (2021)    Anion gap 11/17/2022 12  5 - 15 Final   Performed at [facility], [address], [place], [place] [address]   Alcohol, Ethyl (B) 11/17/2022 251 (H)  <10 mg/dL Final    Comment: (NOTE) Lowest detectable limit for serum alcohol is 10 mg/dL.  For medical purposes only. Performed at [facility], [address] [place], [place] [address]    WBC 11/17/2022 5.6  4.0 - 10.5 K/uL Final   RBC 11/17/2022 3.71 (L)  4.22 - 5.81 MIL/uL Final   Hemoglobin 11/17/2022 11.5 (L)  13.0 - 17.0 g/dL Final   HCT 41/32/4401 34.8 (L)  39.0 - 52.0 % Final   MCV 11/17/2022 93.8  80.0 - 100.0 fL Final   MCH 11/17/2022 31.0  26.0 - 34.0 pg Final   MCHC 11/17/2022 33.0  30.0 - 36.0 g/dL Final   RDW 02/72/5366 12.1  11.5 - 15.5 % Final   Platelets 11/17/2022 290  150 - 400 K/uL Final   nRBC 11/17/2022 0.0  0.0 - 0.2 % Final   Neutrophils Relative % 11/17/2022 41  % Final   Neutro Abs 11/17/2022 2.3  1.7 - 7.7 K/uL Final   Lymphocytes Relative 11/17/2022 37  % Final   Lymphs Abs 11/17/2022 2.1  0.7 - 4.0 K/uL Final   Monocytes Relative 11/17/2022 5  % Final   Monocytes Absolute 11/17/2022 0.3  0.1 - 1.0 K/uL Final   Eosinophils Relative 11/17/2022 15  % Final   Eosinophils Absolute 11/17/2022 0.8 (H)  0.0 - 0.5 K/uL Final   Basophils Relative 11/17/2022 2  % Final   Basophils Absolute 11/17/2022 0.1  0.0 - 0.1 K/uL Final   Immature Granulocytes 11/17/2022 0  % Final   Abs Immature Granulocytes 11/17/2022 0.01  0.00 - 0.07 K/uL Final   Performed at [facility], [address] [place], [place] [address]  Admission on 11/16/2022, Discharged on 11/17/2022  Component Date Value Ref Range Status   WBC 11/16/2022 6.0  4.0 - 10.5 K/uL Final   RBC 11/16/2022 3.87 (L)  4.22 - 5.81 MIL/uL Final   Hemoglobin 11/16/2022 11.8 (L)  13.0 - 17.0 g/dL Final   HCT 74/25/9563 35.6 (L)  39.0 - 52.0 % Final   MCV 11/16/2022 92.0  80.0 - 100.0 fL Final   MCH 11/16/2022 30.5  26.0 - 34.0 pg Final   MCHC 11/16/2022 33.1  30.0 - 36.0 g/dL Final   RDW 87/56/4332 12.4  11.5 - 15.5 % Final   Platelets 11/16/2022 325  150 - 400 K/uL Final   nRBC 11/16/2022 0.0  0.0 - 0.2 % Final    Neutrophils Relative %  11/16/2022 43  % Final   Neutro Abs 11/16/2022 2.6  1.7 - 7.7 K/uL Final   Lymphocytes Relative 11/16/2022 37  % Final   Lymphs Abs 11/16/2022 2.2  0.7 - 4.0 K/uL Final   Monocytes Relative 11/16/2022 5  % Final   Monocytes Absolute 11/16/2022 0.3  0.1 - 1.0 K/uL Final   Eosinophils Relative 11/16/2022 13  % Final   Eosinophils Absolute 11/16/2022 0.8 (H)  0.0 - 0.5 K/uL Final   Basophils Relative 11/16/2022 2  % Final   Basophils Absolute 11/16/2022 0.1  0.0 - 0.1 K/uL Final   Immature Granulocytes 11/16/2022 0  % Final   Abs Immature Granulocytes 11/16/2022 0.01  0.00 - 0.07 K/uL Final   Performed at [facility], [address] [place], [place] [address]   Sodium 11/16/2022 141  135 - 145 mmol/L Final   Potassium 11/16/2022 4.1  3.5 - 5.1 mmol/L Final   Chloride 11/16/2022 107  98 - 111 mmol/L Final   CO2 11/16/2022 23  22 - 32 mmol/L Final   Glucose, Bld 11/16/2022 96  70 - 99 mg/dL Final   Glucose reference range applies only to samples taken after fasting for at least 8 hours.   BUN 11/16/2022 8  6 - 20 mg/dL Final   Creatinine, Ser 11/16/2022 0.80  0.61 - 1.24 mg/dL Final   Calcium 84/69/6295 9.5  8.9 - 10.3 mg/dL Final   Total Protein 28/41/3244 6.9  6.5 - 8.1 g/dL Final   Albumin 05/07/7251 3.8  3.5 - 5.0 g/dL Final   AST 66/44/0347 32  15 - 41 U/L Final   ALT 11/16/2022 27  0 - 44 U/L Final   Alkaline Phosphatase 11/16/2022 79  38 - 126 U/L Final   Total Bilirubin 11/16/2022 0.4  0.3 - 1.2 mg/dL Final   GFR, Estimated 11/16/2022 >60  >60 mL/min Final   Comment: (NOTE) Calculated using the CKD-EPI Creatinine Equation (2021)    Anion gap 11/16/2022 11  5 - 15 Final   Performed at [facility], [address] [place], [place] [address]   Alcohol, Ethyl (B) 11/16/2022 350 (HH)  <10 mg/dL Final   Comment: CRITICAL RESULT CALLED TO, READ BACK BY AND VERIFIED WITH Clearnce Hasten, RN. [phone] 11/16/22. LPAIT (NOTE) Lowest detectable limit for serum  alcohol is 10 mg/dL.  For medical purposes only. Performed at [facility], [address], [place], [place] [address]    Cholesterol 11/16/2022 209 (H)  0 - 200 mg/dL Final   Triglycerides 29/51/8841 134  <150 mg/dL Final   HDL 66/10/3014 55  >40 mg/dL Final   Total CHOL/HDL Ratio 11/16/2022 3.8  RATIO Final   VLDL 11/16/2022 27  0 - 40 mg/dL Final   LDL Cholesterol 11/16/2022 127 (H)  0 - 99 mg/dL Final   Comment:        Total Cholesterol/HDL:CHD Risk Coronary Heart Disease Risk Table                     Men   Women  1/2 Average Risk   3.4   3.3  Average Risk       5.0   4.4  2 X Average Risk   9.6   7.1  3 X Average Risk  23.4   11.0        Use the calculated Patient Ratio above and the CHD Risk Table to determine the patient's CHD Risk.  ATP III CLASSIFICATION (LDL):  <100     mg/dL   Optimal  253-664  mg/dL   Near or Above                    Optimal  130-159  mg/dL   Borderline  403-474  mg/dL   High  >259     mg/dL   Very High Performed at [facility], [address], [place], [place] [address]    POC Amphetamine UR 11/16/2022 None Detected  NONE DETECTED (Cut Off Level 1000 ng/mL) Final   POC Secobarbital (BAR) 11/16/2022 None Detected  NONE DETECTED (Cut Off Level 300 ng/mL) Final   POC Buprenorphine (BUP) 11/16/2022 None Detected  NONE DETECTED (Cut Off Level 10 ng/mL) Final   POC Oxazepam (BZO) 11/16/2022 Positive (A)  NONE DETECTED (Cut Off Level 300 ng/mL) Final   POC Cocaine UR 11/16/2022 None Detected  NONE DETECTED (Cut Off Level 300 ng/mL) Final   POC Methamphetamine UR 11/16/2022 None Detected  NONE DETECTED (Cut Off Level 1000 ng/mL) Final   POC Morphine 11/16/2022 None Detected  NONE DETECTED (Cut Off Level 300 ng/mL) Final   POC Methadone UR 11/16/2022 None Detected  NONE DETECTED (Cut Off Level 300 ng/mL) Final   POC Oxycodone UR 11/16/2022 None Detected  NONE DETECTED (Cut Off Level 100 ng/mL) Final   POC Marijuana UR 11/16/2022 None  Detected  NONE DETECTED (Cut Off Level 50 ng/mL) Final  Admission on 09/20/2022, Discharged on 09/20/2022  Component Date Value Ref Range Status   Sodium 09/20/2022 133 (L)  135 - 145 mmol/L Final   Potassium 09/20/2022 3.9  3.5 - 5.1 mmol/L Final   Chloride 09/20/2022 98  98 - 111 mmol/L Final   CO2 09/20/2022 23  22 - 32 mmol/L Final   Glucose, Bld 09/20/2022 107 (H)  70 - 99 mg/dL Final   Glucose reference range applies only to samples taken after fasting for at least 8 hours.   BUN 09/20/2022 15  6 - 20 mg/dL Final   Creatinine, Ser 09/20/2022 0.85  0.61 - 1.24 mg/dL Final   Calcium 56/43/3295 10.1  8.9 - 10.3 mg/dL Final   GFR, Estimated 09/20/2022 >60  >60 mL/min Final   Comment: (NOTE) Calculated using the CKD-EPI Creatinine Equation (2021)    Anion gap 09/20/2022 12  5 - 15 Final   Performed at [facility], [address], [place], [place] [address]   WBC 09/20/2022 9.2  4.0 - 10.5 K/uL Final   RBC 09/20/2022 3.05 (L)  4.22 - 5.81 MIL/uL Final   Hemoglobin 09/20/2022 9.9 (L)  13.0 - 17.0 g/dL Final   HCT 66/10/3014 30.6 (L)  39.0 - 52.0 % Final   MCV 09/20/2022 100.3 (H)  80.0 - 100.0 fL Final   MCH 09/20/2022 32.5  26.0 - 34.0 pg Final   MCHC 09/20/2022 32.4  30.0 - 36.0 g/dL Final   RDW 05/13/3233 13.6  11.5 - 15.5 % Final   Platelets 09/20/2022 608 (H)  150 - 400 K/uL Final   nRBC 09/20/2022 0.0  0.0 - 0.2 % Final   Performed at [facility], [address] [place], [place] [address]   Troponin I (High Sensitivity) 09/20/2022 6  <18 ng/L Final   Comment: (NOTE) Elevated high sensitivity troponin I (hsTnI) values and significant  changes across serial measurements may suggest ACS but many other  chronic and acute conditions are known to elevate hsTnI results.  Refer to the "Links" section for chest pain algorithms and additional  guidance. Performed at [facility], [address] [place], [place] [address]    Troponin I (High Sensitivity)  09/20/2022 6  <18 ng/L Final   Comment: (NOTE) Elevated high sensitivity troponin I (hsTnI) values and significant  changes across serial measurements may suggest ACS but many other  chronic and acute conditions are known to elevate hsTnI results.  Refer to the "Links" section for chest pain algorithms and additional  guidance. Performed at [facility], [address], [place], [place] [address]   Admission on 09/14/2022, Discharged on 09/17/2022  Component Date Value Ref Range Status   Sodium 09/14/2022 127 (L)  135 - 145 mmol/L Final   Potassium 09/14/2022 4.0  3.5 - 5.1 mmol/L Final   Chloride 09/14/2022 89 (L)  98 - 111 mmol/L Final   CO2 09/14/2022 22  22 - 32 mmol/L Final   Glucose, Bld 09/14/2022 94  70 - 99 mg/dL Final   Glucose reference range applies only to samples taken after fasting for at least 8 hours.   BUN 09/14/2022 9  6 - 20 mg/dL Final   Creatinine, Ser 09/14/2022 0.84  0.61 - 1.24 mg/dL Final   Calcium 09/81/1914 9.7  8.9 - 10.3 mg/dL Final   GFR, Estimated 09/14/2022 >60  >60 mL/min Final   Comment: (NOTE) Calculated using the CKD-EPI Creatinine Equation (2021)    Anion gap 09/14/2022 16 (H)  5 - 15 Final   Performed at [facility], [address], [place], [place] [address]   WBC 09/14/2022 11.9 (H)  4.0 - 10.5 K/uL Final   RBC 09/14/2022 3.65 (L)  4.22 - 5.81 MIL/uL Final   Hemoglobin 09/14/2022 12.1 (L)  13.0 - 17.0 g/dL Final   HCT 62/13/0865 34.7 (L)  39.0 - 52.0 % Final   MCV 09/14/2022 95.1  80.0 - 100.0 fL Final   MCH 09/14/2022 33.2  26.0 - 34.0 pg Final   MCHC 09/14/2022 34.9  30.0 - 36.0 g/dL Final   RDW 78/46/9629 12.9  11.5 - 15.5 % Final   Platelets 09/14/2022 300  150 - 400 K/uL Final   nRBC 09/14/2022 0.0  0.0 - 0.2 % Final   Performed at [facility], [address] [place], [place] [address]   Troponin I (High Sensitivity) 09/14/2022 118 (HH)  <18 ng/L Final   Comment: CRITICAL RESULT CALLED TO, READ BACK BY AND  VERIFIED WITH C KHOURI RN AT [phone] BY D LONG (NOTE) Elevated high sensitivity troponin I (hsTnI) values and significant  changes across serial measurements may suggest ACS but many other  chronic and acute conditions are known to elevate hsTnI results.  Refer to the "Links" section for chest pain algorithms and additional  guidance. Performed at [facility], [address], [place], [place] [address]    Alcohol, Ethyl (B) 09/14/2022 <10  <10 mg/dL Final   Comment: (NOTE) Lowest detectable limit for serum alcohol is 10 mg/dL.  For medical purposes only. Performed at [facility], [address], [place], [place] [address]    Opiates 09/14/2022 NONE DETECTED  NONE DETECTED Final   Cocaine 09/14/2022 NONE DETECTED  NONE DETECTED Final   Benzodiazepines 09/14/2022 NONE DETECTED  NONE DETECTED Final   Amphetamines 09/14/2022 NONE DETECTED  NONE DETECTED Final   Tetrahydrocannabinol 09/14/2022 POSITIVE (A)  NONE DETECTED Final   Barbiturates 09/14/2022 NONE DETECTED  NONE DETECTED Final   Comment: (NOTE) DRUG SCREEN FOR MEDICAL PURPOSES ONLY.  IF CONFIRMATION IS NEEDED FOR ANY PURPOSE, NOTIFY LAB WITHIN 5 DAYS.  LOWEST DETECTABLE LIMITS FOR URINE DRUG SCREEN Drug Class                     Cutoff (ng/mL) Amphetamine and metabolites    1000 Barbiturate and metabolites    200 Benzodiazepine                 200 Opiates and metabolites        300 Cocaine and metabolites        300 THC                            50 Performed at [facility], [address], [place], [place] [address]    D-Dimer, Quant 09/14/2022 2.46 (H)  0.00 - 0.50 ug/mL-FEU Final   Comment: (NOTE) At the manufacturer cut-off value of 0.5 g/mL FEU, this assay has a negative predictive value of 95-100%.This assay is intended for use in conjunction with a clinical pretest probability (PTP) assessment model to exclude pulmonary embolism (PE) and deep venous thrombosis (DVT) in outpatients  suspected of PE or DVT. Results should be correlated with clinical presentation. Performed at [facility], [address], [place], [place] [address]    Troponin I (High Sensitivity) 09/14/2022 17  <18 ng/L Final   Comment: DELTA CHECK NOTED (NOTE) Elevated high sensitivity troponin I (hsTnI) values and significant  changes across serial measurements may suggest ACS but many other  chronic and acute conditions are known to elevate hsTnI results.  Refer to the "Links" section for chest pain algorithms and additional  guidance. Performed at [facility], [address], [place], [place] [address]    Adenovirus 09/14/2022 NOT DETECTED  NOT DETECTED Final   Coronavirus 229E 09/14/2022 NOT DETECTED  NOT DETECTED Final   Comment: (NOTE) The Coronavirus on the Respiratory Panel, DOES NOT test for the novel  Coronavirus (2019 nCoV)    Coronavirus HKU1 09/14/2022 NOT DETECTED  NOT DETECTED Final   Coronavirus NL63 09/14/2022 NOT DETECTED  NOT DETECTED Final   Coronavirus OC43 09/14/2022 NOT DETECTED  NOT DETECTED Final   Metapneumovirus 09/14/2022 NOT DETECTED  NOT DETECTED Final   Rhinovirus / Enterovirus 09/14/2022 NOT DETECTED  NOT DETECTED Final   Influenza A 09/14/2022 NOT DETECTED  NOT DETECTED Final   Influenza B 09/14/2022 NOT DETECTED  NOT DETECTED Final   Parainfluenza Virus 1 09/14/2022 NOT DETECTED  NOT DETECTED Final   Parainfluenza Virus 2 09/14/2022 NOT DETECTED  NOT DETECTED Final   Parainfluenza Virus 3 09/14/2022 NOT DETECTED  NOT DETECTED Final   Parainfluenza Virus 4 09/14/2022 NOT DETECTED  NOT DETECTED Final   Respiratory Syncytial Virus 09/14/2022 NOT DETECTED  NOT DETECTED Final   Bordetella pertussis 09/14/2022 NOT DETECTED  NOT DETECTED Final   Bordetella Parapertussis 09/14/2022 NOT DETECTED  NOT DETECTED Final   Chlamydophila pneumoniae 09/14/2022 NOT DETECTED  NOT DETECTED Final   Mycoplasma pneumoniae 09/14/2022 NOT DETECTED  NOT DETECTED Final    Performed at [facility], [address], [place], [place] [address]   SARS Coronavirus 2 by RT PCR 09/14/2022 NEGATIVE  NEGATIVE Final   Performed at [facility], [address], [place], [place] [address]   Sodium 09/15/2022 129 (L)  135 - 145 mmol/L  Final   Potassium 09/15/2022 3.2 (L)  3.5 - 5.1 mmol/L Final   Chloride 09/15/2022 95 (L)  98 - 111 mmol/L Final   CO2 09/15/2022 26  22 - 32 mmol/L Final   Glucose, Bld 09/15/2022 94  70 - 99 mg/dL Final   Glucose reference range applies only to samples taken after fasting for at least 8 hours.   BUN 09/15/2022 10  6 - 20 mg/dL Final   Creatinine, Ser 09/15/2022 0.76  0.61 - 1.24 mg/dL Final   Calcium 84/16/6063 9.0  8.9 - 10.3 mg/dL Final   Total Protein 01/60/1093 6.4 (L)  6.5 - 8.1 g/dL Final   Albumin 23/55/7322 2.6 (L)  3.5 - 5.0 g/dL Final   AST 02/54/2706 55 (H)  15 - 41 U/L Final   ALT 09/15/2022 42  0 - 44 U/L Final   Alkaline Phosphatase 09/15/2022 112  38 - 126 U/L Final   Total Bilirubin 09/15/2022 0.8  0.3 - 1.2 mg/dL Final   GFR, Estimated 09/15/2022 >60  >60 mL/min Final   Comment: (NOTE) Calculated using the CKD-EPI Creatinine Equation (2021)    Anion gap 09/15/2022 8  5 - 15 Final   Performed at [facility], [address], [place], [place] [address]   WBC 09/15/2022 8.0  4.0 - 10.5 K/uL Final   RBC 09/15/2022 3.10 (L)  4.22 - 5.81 MIL/uL Final   Hemoglobin 09/15/2022 10.4 (L)  13.0 - 17.0 g/dL Final   HCT 83/15/1761 30.0 (L)  39.0 - 52.0 % Final   MCV 09/15/2022 96.8  80.0 - 100.0 fL Final   MCH 09/15/2022 33.5  26.0 - 34.0 pg Final   MCHC 09/15/2022 34.7  30.0 - 36.0 g/dL Final   RDW 60/73/7106 13.2  11.5 - 15.5 % Final   Platelets 09/15/2022 302  150 - 400 K/uL Final   nRBC 09/15/2022 0.0  0.0 - 0.2 % Final   Performed at [facility], [address], [place], [place] [address]   Osmolality, Ur 09/14/2022 495  300 - 900 mOsm/kg Final   Performed at [facility], [address] [place], [place] [address]   Sodium, Ur 09/14/2022 <10  mmol/L Final   Performed at [facility], [address], [place], [place] [address]   Vitamin B-12 09/15/2022 518  180 - 914 pg/mL Final   Comment: (NOTE) This assay is not validated for testing neonatal or myeloproliferative syndrome specimens for Vitamin B12 levels. Performed at [facility], [address], [place], [place] [address]    Folate 09/15/2022 15.3  >5.9 ng/mL Final   Performed at [facility], [address], [place], [place] [address]   Magnesium 09/15/2022 2.0  1.7 - 2.4 mg/dL Final   Performed at [facility], [address] [place], [place] [address]   MRSA by PCR Next Gen 09/15/2022 NOT DETECTED  NOT DETECTED Final   Comment: (NOTE) The GeneXpert MRSA Assay (FDA approved for NASAL specimens only), is one component of a comprehensive MRSA colonization surveillance program. It is not intended to diagnose MRSA infection nor to guide or monitor treatment for MRSA infections. Test performance is not FDA approved in patients less than 52 years old. Performed at [facility], [address], [place], [place] [address]    Sodium 09/15/2022 131 (L)  135 - 145 mmol/L Final   Potassium 09/15/2022 4.1  3.5 - 5.1 mmol/L Final  Chloride 09/15/2022 94 (L)  98 - 111 mmol/L Final   CO2 09/15/2022 27  22 - 32 mmol/L Final   Glucose, Bld 09/15/2022 178 (H)  70 - 99 mg/dL Final   Glucose reference range applies only to samples taken after fasting for at least 8 hours.   BUN 09/15/2022 12  6 - 20 mg/dL Final   Creatinine, Ser 09/15/2022 0.80  0.61 - 1.24 mg/dL Final   Calcium 08/65/7846 9.5  8.9 - 10.3 mg/dL Final   GFR, Estimated 09/15/2022 >60  >60 mL/min Final   Comment: (NOTE) Calculated using the CKD-EPI Creatinine Equation (2021)    Anion gap 09/15/2022 10  5 - 15 Final   Performed at [facility] Lab, [address], [place], [place] [address]   Sodium 09/16/2022 129 (L)  135 -  145 mmol/L Final   Potassium 09/16/2022 3.9  3.5 - 5.1 mmol/L Final   Chloride 09/16/2022 96 (L)  98 - 111 mmol/L Final   CO2 09/16/2022 24  22 - 32 mmol/L Final   Glucose, Bld 09/16/2022 207 (H)  70 - 99 mg/dL Final   Glucose reference range applies only to samples taken after fasting for at least 8 hours.   BUN 09/16/2022 14  6 - 20 mg/dL Final   Creatinine, Ser 09/16/2022 0.64  0.61 - 1.24 mg/dL Final   Calcium 28/41/3244 9.6  8.9 - 10.3 mg/dL Final   GFR, Estimated 09/16/2022 >60  >60 mL/min Final   Comment: (NOTE) Calculated using the CKD-EPI Creatinine Equation (2021)    Anion gap 09/16/2022 9  5 - 15 Final   Performed at [facility] Lab, [address], [place], [place] [address]   Magnesium 09/16/2022 1.6 (L)  1.7 - 2.4 mg/dL Final   Performed at [facility], [address], [place], [place] [address]   Phosphorus 09/16/2022 2.1 (L)  2.5 - 4.6 mg/dL Final   Performed at [facility], [address] [place], [place] [address]   Sodium 09/17/2022 133 (L)  135 - 145 mmol/L Final   Potassium 09/17/2022 3.7  3.5 - 5.1 mmol/L Final   Chloride 09/17/2022 97 (L)  98 - 111 mmol/L Final   CO2 09/17/2022 26  22 - 32 mmol/L Final   Glucose, Bld 09/17/2022 161 (H)  70 - 99 mg/dL Final   Glucose reference range applies only to samples taken after fasting for at least 8 hours.   BUN 09/17/2022 12  6 - 20 mg/dL Final   Creatinine, Ser 09/17/2022 0.80  0.61 - 1.24 mg/dL Final   Calcium 74/25/9563 9.5  8.9 - 10.3 mg/dL Final   GFR, Estimated 09/17/2022 >60  >60 mL/min Final   Comment: (NOTE) Calculated using the CKD-EPI Creatinine Equation (2021)    Anion gap 09/17/2022 10  5 - 15 Final   Performed at [facility], [address] [place], [place] [address]  Admission on 08/31/2022, Discharged on 08/31/2022  Component Date Value Ref Range Status   Sodium 08/31/2022 135  135 - 145 mmol/L Final   Potassium 08/31/2022 4.2  3.5 - 5.1 mmol/L Final   HEMOLYSIS AT THIS  LEVEL MAY AFFECT RESULT   Chloride 08/31/2022 100  98 - 111 mmol/L Final   CO2 08/31/2022 24  22 - 32 mmol/L Final   Glucose, Bld 08/31/2022 134 (H)  70 - 99 mg/dL Final   Glucose reference range applies only to samples taken after fasting for at least  8 hours.   BUN 08/31/2022 9  6 - 20 mg/dL Final   Creatinine, Ser 08/31/2022 0.89  0.61 - 1.24 mg/dL Final   Calcium 86/57/8469 9.1  8.9 - 10.3 mg/dL Final   Total Protein 62/95/2841 6.8  6.5 - 8.1 g/dL Final   Albumin 32/44/0102 3.3 (L)  3.5 - 5.0 g/dL Final   AST 72/53/6644 78 (H)  15 - 41 U/L Final   HEMOLYSIS AT THIS LEVEL MAY AFFECT RESULT   ALT 08/31/2022 49 (H)  0 - 44 U/L Final   HEMOLYSIS AT THIS LEVEL MAY AFFECT RESULT   Alkaline Phosphatase 08/31/2022 96  38 - 126 U/L Final   Total Bilirubin 08/31/2022 0.9  0.3 - 1.2 mg/dL Final   HEMOLYSIS AT THIS LEVEL MAY AFFECT RESULT   GFR, Estimated 08/31/2022 >60  >60 mL/min Final   Comment: (NOTE) Calculated using the CKD-EPI Creatinine Equation (2021)    Anion gap 08/31/2022 11  5 - 15 Final   Performed at [facility], [address] [place], [place] [address]   Alcohol, Ethyl (B) 08/31/2022 396 (HH)  <10 mg/dL Final   Comment: CRITICAL RESULT CALLED TO, READ BACK BY AND VERIFIED WITH C,CARTER RN @1714  08/31/22 E,BENTON (NOTE) Lowest detectable limit for serum alcohol is 10 mg/dL.  For medical purposes only. Performed at [facility], [address] [place], [place] [address]    WBC 08/31/2022 7.3  4.0 - 10.5 K/uL Final   RBC 08/31/2022 3.40 (L)  4.22 - 5.81 MIL/uL Final   Hemoglobin 08/31/2022 11.3 (L)  13.0 - 17.0 g/dL Final   HCT 38/75/6433 32.2 (L)  39.0 - 52.0 % Final   MCV 08/31/2022 94.7  80.0 - 100.0 fL Final   MCH 08/31/2022 33.2  26.0 - 34.0 pg Final   MCHC 08/31/2022 35.1  30.0 - 36.0 g/dL Final   RDW 29/51/8841 13.5  11.5 - 15.5 % Final   Platelets 08/31/2022 295  150 - 400 K/uL Final   nRBC 08/31/2022 0.0  0.0 - 0.2 % Final   Performed at [facility], [address] [place], [place] [address]    Blood Alcohol level:  Lab Results  Component Value Date   ETH 251 (H) 11/17/2022   ETH 350 (HH) 11/16/2022    Metabolic Disorder Labs: Lab Results  Component Value Date   HGBA1C 5.7 (H) 05/02/2021   MPG 116.89 05/02/2021   No results found for: "PROLACTIN" Lab Results  Component Value Date   CHOL 209 (H) 11/16/2022   TRIG 134 11/16/2022   HDL 55 11/16/2022   CHOLHDL 3.8 11/16/2022   VLDL 27 11/16/2022   LDLCALC 127 (H) 11/16/2022    Therapeutic Lab Levels: No results found for: "LITHIUM" No results found for: "VALPROATE" No results found for: "CBMZ"  Physical Findings   AUDIT    Flowsheet Row Admission (Discharged) from 05/01/2014 in [facility]ERV[facility] UN[facility]  Alcohol Use Disorder Identification Test Final Score (AUDIT) 32      CAGE-AID    Flowsheet Row ED to Hosp-Admission (Discharged) from 04/16/2022 in  [facility]  CAGE-AID Score 3      PHQ2-9    Flowsheet Row ED from 11/17/2022 in [facility] ED from 05/02/2021 in [facility]  PHQ-2 Total Score 2 2  PHQ-9 Total Score 11 4      Flowsheet Row ED [facility]17/2022 in [facility]  Most recent reading at 11/17/2022  4:34 PM ED from 11/17/2022 in [facility] Most recent reading at 11/17/2022  5:44 AM ED from 11/17/2022 in [facility] Emergency Department at [facility] Most recent reading at 11/17/2022  1:00 AM  C-SSRS RISK CATEGORY No Risk Moderate Risk Moderate Risk        Musculoskeletal  Strength & Muscle Tone: within normal limits Gait & Station: normal Patient leans: N/A  Psychiatric Specialty Exam  Presentation  General Appearance:  Appropriate for Environment  Eye Contact: Fair  Speech: Garbled; Normal Rate  Speech Volume: Normal  Handedness: Right   Mood and Affect   Mood: Euthymic  Affect: Appropriate; Congruent   Thought Process  Thought Processes: Coherent; Goal Directed  Descriptions of Associations:Circumstantial  Orientation:Full (Time, Place and Person)  Thought Content:Logical  Diagnosis of Schizophrenia or Schizoaffective disorder in past: No    Hallucinations:Hallucinations: None  Ideas of Reference:None  Suicidal Thoughts:Suicidal Thoughts: No  Homicidal Thoughts:Homicidal Thoughts: No   Sensorium  Memory: Immediate Fair  Judgment: Fair  Insight: Fair   Art therapist  Concentration: Fair  Attention Span: Fair  Recall: Fair  Fund of Knowledge: Fair  Language: Fair   Psychomotor Activity   Psychomotor Activity: Normal   Assets   Desire for Improvement   Sleep   Sleep: Good     Physical Exam  Physical Exam Vitals reviewed.  Constitutional:      General: He is not in acute distress.    Appearance: He is obese.  HENT:     Head: Normocephalic and atraumatic.  Eyes:     Extraocular Movements: Extraocular movements intact.  Pulmonary:     Effort: Pulmonary effort is normal.  Abdominal:     Palpations: Abdomen is soft.  Musculoskeletal:        General: Normal range of motion.  Skin:    General: Skin is warm and dry.  Neurological:     Mental Status: He is alert. Mental status is at baseline.  Psychiatric:        Mood and Affect: Mood normal.        Behavior: Behavior normal.        Thought Content: Thought content normal.    Review of Systems  Gastrointestinal:  Negative for nausea and vomiting.  Psychiatric/Behavioral:  Positive for substance abuse. Negative for hallucinations and suicidal ideas. The patient is nervous/anxious. The patient does not have insomnia.   All other systems reviewed and are negative.  Blood pressure (!) 149/94, pulse 67, temperature 98 F (36.7 C), temperature source Oral, resp. rate 18, SpO2 99%. There is no height or weight on file to  calculate BMI.  Treatment Plan Summary: Daily contact with patient to assess and evaluate symptoms and progress in treatment  Mr. Seals is a 52 year old male with a past history of alcohol use disorder severe requiring long-term residential substance use treatment.  Patient appears baseline today.  Interested in discharge with outpatient resources to manage logistical issues, then will self-present to [facility].  The patient was grateful for staff's.  Discussed wanting to ensure patient's safety before discharging without Ativan -- he agreed with holding afternoon Ativan dose while observing patient for signs of acute alcohol withdrawal.  Will receive morning dose at 10 AM.  Last 3 CIWAs: 1, 1, 0.  Otherwise, patient is sleeping and eating well.  No other concerns at this time.  Patient had never heard of naltrexone before, LFTs within normal limits.  Will  begin today.  #EtOH Abuse #C/F substance-induced mood disorder - Patient chronically abuses alcohol, drinking ~ 6 40 oz beers daily  - Most recent detox was in the [facility] ED on May 2024  - Ativan taper currently in place, day 3 of 4. - Begin naltrexone 50 mg daily to target alcohol cravings. - PRNs ordered  - PHQ-9 of 11, will follow-up before discharge.  Tomie China, MD 11/20/2022 8:44 AM

## 2022-11-20 NOTE — ED Notes (Signed)
Pt was given breakfast

## 2022-11-20 NOTE — Discharge Instructions (Addendum)
[facility] [address] [place], [place], [address] [phone] phone  New Patient Assessment/Therapy Walk-Ins:  Monday and Wednesday: 8 am until slots are full. Every 1st and 2nd Fridays of the month: 1 pm - 5 pm.  NO ASSESSMENT/THERAPY WALK-INS ON TUESDAYS OR THURSDAYS  New Patient Assessment/Medication Management Walk-Ins:  Monday - Friday:  8 am - 11 am.  For all walk-ins, we ask that you arrive by 7:30 am because patients will be seen in the order of arrival.  Availability is limited; therefore, you may not be seen on the same day that you walk-in.  Our goal is to serve and meet the needs of our community to the best of our ability.  SUBSTANCE USE TREATMENT for Medicaid and State Funded/IPRS  [facility] [address] [place], [place], [address] [phone] phone NOTE: [facility] is no longer offering IOP services.  Serves those who are low-income or have no insurance.  [facility] [address], [place], [place], [address] [phone] phone [phone] fax NOTE: Does have Substance Abuse-Intensive Outpatient Program (SAIOP) as well as transitional housing if eligible.  [facility] [address] [place], [place], [address] [phone] phone [phone] fax  [facility] [address] [place], [place], [address] [phone] phone [phone] fax  HALFWAY HOUSES:  [facility] [phone]  [facility] www.oxfordvacancies.com  12 STEP PROGRAMS:  Alcoholics Anonymous of [place] https://aagreensboronc.com/meeting  Narcotics Anonymous of [place] https://greensborona.org/meetings/  Al-Anon of [place], [place] www.greens[place]alanon.org/find-meetings.html  Nar-Anon https://nar-anon.org/find-a-meetin  List of Residential placements:   [facility] in [place]: [phone]  [facility]: [phone]  [facility]: [place], [place]  [phone]: Male and male facility; 30-day program: (uninsured and Medicaid such as [facility], [facility], [facility], partners)  [facility]: [phone]; men and women's facility; 28 days; Can have Medicaid tailored plan ([facility] or [facility])  [facility]: [phone] [place] or [place]; 28 day program; must be fully detox; tailored Medicaid or no insurance  [facility] in [place], [place]; [phone]; 28 day all males program; no insurance accepted  [facility] in [place]: Joe [phone] (no insurance or Medicaid only); 90 days; outpatient services but provide housing in apartments [place]  [facility] Admission: [phone]: Patient must complete phone screening for placement: [place], [place]; 6 month program; uninsured, Medicaid, and [facility] insurance.   [facility]: no insurance required; [phone]  [place] [facility]: [phone]; Intake: Robert; Must fill out application online; Victor Delay [phone] x [phone]  [facility] in [place], [place]: [phone]; Admissions Coordinators Mr. Dennis or David Gibson; 90 day program.  [facility]: [place], [place] [phone]; Co-Ed 9 month to a year program; Online application; Men entry fee is $500 (6-12months);  [facility]: [address] [place], [place] [address]; no fee or insurance required; minimum of 2 years; Highly structured; work based; Intake Coordinator is Chris [phone]  [facility] in [place], [place]: [phone]; Fax number is [phone]; website: www.[facility].org; Requires 3-6 page autobiography; 2 year program (18 months and then 6month transitional housing); Admission fee is $300; no insurance needed; work program  [facility] in [place], [place]: Front Desk Staff: Reeci [phone]: They have a Men's Regenerations Program 6-9months. Free program; There is an initial $300 fee however, they are willing to work  with patients regarding that. Application is online.  [facility]: Admissions [phone] Benjamin Cox ext [phone]; Any 7-90 day program is out of pocket; 12   month program is free of charge; there is a $275 entry fee; Patient is responsible for own transportation 

## 2022-11-20 NOTE — ED Notes (Signed)
Patient is calm and cooperative. He denies SI/HI/AVH. Patient reports sleep was "good" last night. Pt has not determined goals for today. States "I'm sure I will come up with something today". Patient denies any additional needs at this time. Will continue to monitor for safety.
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
## 2022-11-20 NOTE — ED Notes (Signed)
Pt observed in the dayroom, watching television. Is calm and cooperative. Denies any needs at this moment. Will continue to monitor for safety.

## 2022-11-20 NOTE — ED Notes (Signed)
Pt sleeping@this time. Breathing even and unlabored. Will continue to monitor for safety 
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
## 2022-11-20 NOTE — ED Notes (Addendum)
Patient observed/assessed in the dayroom. Patient alert and oriented x 4. Affect is bright. Patient denies pain and anxiety. He denies A/V/H. He denies having any thoughts/plan of self harm and harm towards others. Patient states that appetite has been good throughout the day. Pt stated he feels better today, with no tremors. Verbalizes no further complaints at this time. Will continue to monitor and support.

## 2022-11-20 NOTE — ED Notes (Signed)
Patient is sleeping. Respirations equal and unlabored, skin warm and dry. No change in assessment or acuity. Routine safety checks conducted according to facility protocol. Will continue to monitor for safety.   

## 2022-11-20 NOTE — Discharge Planning (Signed)
LCSW spoke with patient on today regarding plan. Patient made aware that [facility] currently does not have beds available and will not have any for the next few weeks, per [facility] in Admissions. Patient expressed understanding and LCSW explored if patient would be interest in any of the other long term facilities. Patient reports his plan is to discharge back to his tent once stable as he has personal responsibilities he needs to take care of first before he goes to treatment. Patient reports his main concerns is getting his ID from the [facility], getting his court affairs taken care of for December 16, 2022, and getting back to the [employer] on [address]. Patient declined additional referrals at this time stating he has to get these things taken care of. LCSW expressed understanding and informed him that resources will provided in his AVS for his follow up. No other needs were reported at this time.   Fernande Boyden, LCSW Clinical Social Worker [facility] Ph: [phone]

## 2022-11-21 MED ORDER — VITAMIN B-1 100 MG PO TABS: 100.0000 mg | ORAL_TABLET | Freq: Every day | ORAL | 0 refills | Status: AC

## 2022-11-21 MED ORDER — TRAZODONE HCL 50 MG PO TABS: 50.0000 mg | ORAL_TABLET | Freq: Every evening | ORAL | 0 refills | Status: DC | PRN

## 2022-11-21 MED ORDER — NALTREXONE HCL 50 MG PO TABS: 50.0000 mg | ORAL_TABLET | Freq: Every day | ORAL | 0 refills | Status: AC

## 2022-11-21 MED ORDER — HYDROXYZINE HCL 25 MG PO TABS: 25.0000 mg | ORAL_TABLET | Freq: Four times a day (QID) | ORAL | 0 refills | Status: DC | PRN

## 2022-11-21 NOTE — ED Notes (Signed)
Patient is sleeping. Respirations equal and unlabored, skin warm and dry. No change in assessment or acuity. Routine safety checks conducted according to facility protocol. Will continue to monitor for safety.   

## 2022-11-21 NOTE — ED Notes (Signed)
Patient A&O x 4, ambulatory. Patient discharged in no acute distress. Patient denied SI/HI, A/VH upon discharge. Patient verbalized understanding of all discharge instructions explained by staff, to include follow up appointment recommendations and safety plan. Patient reported mood 10/10.  Pt belongings returned to patient from locker #9 intact. Patient escorted to lobby via staff for transport to destination. Safety maintained.

## 2022-11-21 NOTE — ED Notes (Signed)
Patient A&Ox4. Denies intent to harm self/others when asked. Denies A/VH. Patient denies any physical complaints when asked. No acute distress noted. Routine safety checks conducted according to facility protocol. Encouraged patient to notify staff if thoughts of harm toward self or others arise. Patient verbalize understanding and agreement. Will continue to monitor for safety.    

## 2022-11-21 NOTE — ED Provider Notes (Addendum)
FBC/OBS ASAP Discharge Summary  Date and Time: 11/21/2022 7:49 AM  Name: Gary Mclaughlin  MRN:  [id number]   Discharge Diagnoses:  Final diagnoses:  ETOH abuse    Subjective: NAEON. No new labs. NO PRNs. No refusals. Discontinued Ativan taper on day 3 of 4, yesterday afternoon. Plan for discharge today.  On interview, patient again thanked provider for support during stay. Plans to return to his tent. Will pick up his medications at the [facility] on [facility] and receive outpatient resources as normal. Slept well. Denies SI, HI, and AVH. Is confident in his ability to stay sober with the help of AA: "One step at a time."   Stay Summary: Patient stay was almost entirely uneventful. Initial CIWA of 6 on 7/14 -- remained at 0 for duration of Ativan taper (7/15-7/18.) Taper discontinued early as patient looked well and desired discharge Friday. Patient required minimal PRNs and had no episodes of agitation. Denied SI, HI, and AVH throughout stay. Per chart review, denied access to firearms 7/14. Pleasant and cooperative. Voiced desire to self-present to long-term [facility] residential substance use treatment after first coordinating legal/personal matters outpatient. Will reconnect with regular AA group after discharge.   Total Time spent with patient: 2 hours  Past Psychiatric History: EtOH abuse.   Past Medical History: Subarachnoid hemorrhage, tobacco abuse, hepatic steatosis, transaminatis, COPD Family History: N/a Social History: Homeless since 2012 and lives in a tent. Unemployed, last job in [employer]. Has family a brother and son in [place]. Mom is still alive in [place]. No family support nearby.    Current Medications:  Current Facility-Administered Medications  Medication Dose Route Frequency Provider Last Rate Last Admin   acetaminophen (TYLENOL) tablet 650 mg  650 mg Oral Q6H PRN Peterson Ao, MD   650 mg at 11/19/22 2316   alum & mag hydroxide-simeth (MAALOX/MYLANTA)  200-200-20 MG/5ML suspension 30 mL  30 mL Oral Q4H PRN Peterson Ao, MD       hydrOXYzine (ATARAX) tablet 25 mg  25 mg Oral Q6H PRN Tomie China, MD       magnesium hydroxide (MILK OF MAGNESIA) suspension 30 mL  30 mL Oral Daily PRN Peterson Ao, MD       multivitamin with minerals tablet 1 tablet  1 tablet Oral Daily Peterson Ao, MD   1 tablet at 11/20/22 0915   naltrexone (DEPADE) tablet 50 mg  50 mg Oral Daily Tomie China, MD   50 mg at 11/20/22 4098   thiamine (VITAMIN B1) tablet 100 mg  100 mg Oral Daily Peterson Ao, MD   100 mg at 11/20/22 0915   traZODone (DESYREL) tablet 50 mg  50 mg Oral QHS PRN Peterson Ao, MD   50 mg at 11/19/22 2316   No current outpatient medications on file.    PTA Medications:  Facility Ordered Medications  Medication   [COMPLETED] sodium chloride 0.9 % bolus 1,000 mL   [COMPLETED] LORazepam (ATIVAN) tablet 1 mg   Followed by   [COMPLETED] LORazepam (ATIVAN) tablet 1 mg   Followed by   [COMPLETED] LORazepam (ATIVAN) tablet 1 mg   multivitamin with minerals tablet 1 tablet   thiamine (VITAMIN B1) tablet 100 mg   acetaminophen (TYLENOL) tablet 650 mg   alum & mag hydroxide-simeth (MAALOX/MYLANTA) 200-200-20 MG/5ML suspension 30 mL   [EXPIRED] hydrOXYzine (ATARAX) tablet 25 mg   [EXPIRED] loperamide (IMODIUM) capsule 2-4 mg   [EXPIRED] LORazepam (ATIVAN) tablet 1 mg   magnesium hydroxide (MILK OF MAGNESIA) suspension  30 mL   [EXPIRED] ondansetron (ZOFRAN-ODT) disintegrating tablet 4 mg   traZODone (DESYREL) tablet 50 mg   hydrOXYzine (ATARAX) tablet 25 mg   naltrexone (DEPADE) tablet 50 mg       11/20/2022   10:48 AM 11/17/2022    4:01 PM 05/02/2021    4:40 PM  Depression screen PHQ 2/9  Decreased Interest 0 1 1  Down, Depressed, Hopeless 0 1 1  PHQ - 2 Score 0 2 2  Altered sleeping 1 2 0  Tired, decreased energy 0 2 0  Change in appetite 0 1 0  Feeling bad or failure about yourself  0 2 2  Trouble concentrating  0 0 0  Moving slowly or fidgety/restless 0 1 0  Suicidal thoughts 0 1 0  PHQ-9 Score 1 11 4   Difficult doing work/chores Not difficult at all Somewhat difficult Somewhat difficult    Flowsheet Row ED from 11/17/2022 in [facility] Most recent reading at 11/17/2022  4:34 PM ED from 11/17/2022 in [facility] Most recent reading at 11/17/2022  5:44 AM ED from 11/17/2022 in [facility] Emergency Department at [facility] Most recent reading at 11/17/2022  1:00 AM  C-SSRS RISK CATEGORY No Risk Moderate Risk Moderate Risk       Musculoskeletal  Strength & Muscle Tone: within normal limits Gait & Station: normal Patient leans: N/A  Psychiatric Specialty Exam  Presentation  General Appearance:  Appropriate for Environment  Eye Contact: Fair  Speech: Normal Rate; Garbled  Speech Volume: Normal  Handedness: Right   Mood and Affect  Mood: Euthymic  Affect: Appropriate; Congruent   Thought Process  Thought Processes: Coherent; Goal Directed  Descriptions of Associations:Circumstantial  Orientation:Full (Time, Place and Person)  Thought Content:Logical  Diagnosis of Schizophrenia or Schizoaffective disorder in past: No    Hallucinations:Hallucinations: None  Ideas of Reference:None  Suicidal Thoughts:Suicidal Thoughts: No  Homicidal Thoughts:Homicidal Thoughts: No   Sensorium  Memory: Immediate Fair  Judgment: Fair  Insight: Fair   Art therapist  Concentration: Fair  Attention Span: Fair  Recall: Fair  Fund of Knowledge: Fair  Language: Fair   Psychomotor Activity  Psychomotor Activity: Psychomotor Activity: Normal   Assets  Assets: Desire for Improvement   Sleep  Sleep: Sleep: Good   Physical Exam  Physical Exam Constitutional:      General: He is not in acute distress.    Appearance: He is normal weight. He is not ill-appearing.  HENT:     Head:  Normocephalic and atraumatic.     Mouth/Throat:     Comments: Poor dentition Eyes:     General: Scleral icterus present.     Extraocular Movements: Extraocular movements intact.  Pulmonary:     Effort: Pulmonary effort is normal.  Musculoskeletal:        General: Normal range of motion.     Cervical back: Normal range of motion.  Neurological:     Mental Status: He is oriented to person, place, and time. Mental status is at baseline.  Psychiatric:        Mood and Affect: Mood normal.        Behavior: Behavior normal.    Review of Systems  Psychiatric/Behavioral:  Positive for substance abuse. Negative for depression, hallucinations and suicidal ideas. The patient is not nervous/anxious and does not have insomnia.    Blood pressure 129/83, pulse 79, temperature 98.7 F (37.1 C), temperature source Oral, resp. rate 18, SpO2 98%. There  is no height or weight on file to calculate BMI.  Demographic Factors:  Divorced or widowed, Low socioeconomic status, Living alone, and Unemployed  Loss Factors: Legal issues  Historical Factors: Family history of mental illness or substance abuse and Impulsivity  Risk Reduction Factors:   Sense of responsibility to family, Religious beliefs about death, Positive social support, and Positive therapeutic relationship  Continued Clinical Symptoms:  Alcohol/Substance Abuse/Dependencies  Cognitive Features That Contribute To Risk:  Loss of executive function    Suicide Risk:  Mild:  Suicidal ideation of limited frequency, intensity, duration, and specificity.  There are no identifiable plans, no associated intent, mild dysphoria and related symptoms, good self-control (both objective and subjective assessment), few other risk factors, and identifiable protective factors, including available and accessible social support.  Plan Of Care/Follow-up recommendations:  Follow-up recommendations:  Activity:  Normal, as tolerated Diet:  Per PCP  recommendation  Patient is instructed prior to discharge to: Take all medications as prescribed by her mental healthcare provider. Report any adverse effects and/or reactions from the medicines to her outpatient provider promptly. Patient has been instructed & cautioned: To not engage in alcohol and or illegal drug use while on prescription medicines.  In the event of worsening symptoms, patient is instructed to call the crisis hotline at 988, 911 and or go to the nearest ED for appropriate evaluation and treatment of symptoms. To follow-up with her primary care provider for your other medical issues, concerns and or health care needs.   Disposition: Self-care  Tomie China, MD 11/21/2022, 7:49 AM

## 2023-02-17 ENCOUNTER — Encounter (HOSPITAL_COMMUNITY): Payer: Self-pay

## 2023-02-17 ENCOUNTER — Emergency Department (HOSPITAL_COMMUNITY)
Admission: EM | Admit: 2023-02-17 | Discharge: 2023-02-17 | Disposition: A | Discharge status: Home / Self Care | Payer: MEDICAID | Attending: Emergency Medicine | Admitting: Emergency Medicine

## 2023-02-17 ENCOUNTER — Other Ambulatory Visit: Payer: Self-pay

## 2023-02-17 DIAGNOSIS — F10120 Alcohol abuse with intoxication, uncomplicated: Secondary | ICD-10-CM | POA: Diagnosis present

## 2023-02-17 DIAGNOSIS — Y908 Blood alcohol level of 240 mg/100 ml or more: Secondary | ICD-10-CM | POA: Insufficient documentation

## 2023-02-17 DIAGNOSIS — F10920 Alcohol use, unspecified with intoxication, uncomplicated: Secondary | ICD-10-CM

## 2023-02-17 LAB — COMPREHENSIVE METABOLIC PANEL
ALT: 36 U/L (ref 0–44)
AST: 43 U/L — ABNORMAL HIGH (ref 15–41)
Albumin: 4.4 g/dL (ref 3.5–5.0)
Alkaline Phosphatase: 78 U/L (ref 38–126)
Anion gap: 12 (ref 5–15)
BUN: 12 mg/dL (ref 6–20)
CO2: 23 mmol/L (ref 22–32)
Calcium: 9.7 mg/dL (ref 8.9–10.3)
Chloride: 106 mmol/L (ref 98–111)
Creatinine, Ser: 0.85 mg/dL (ref 0.61–1.24)
GFR, Estimated: >60 mL/min (ref >60)
Glucose, Bld: 87 mg/dL (ref 70–99)
Potassium: 4.3 mmol/L (ref 3.5–5.1)
Sodium: 141 mmol/L (ref 135–145)
Total Bilirubin: 0.4 mg/dL (ref 0.3–1.2)
Total Protein: 8 g/dL (ref 6.5–8.1)

## 2023-02-17 LAB — CBC
HCT: 44.4 % (ref 39.0–52.0)
Hemoglobin: 14.6 g/dL (ref 13.0–17.0)
MCH: 31.5 pg (ref 26.0–34.0)
MCHC: 32.9 g/dL (ref 30.0–36.0)
MCV: 95.9 fL (ref 80.0–100.0)
Platelets: 521 10*3/uL — ABNORMAL HIGH (ref 150–400)
RBC: 4.63 MIL/uL (ref 4.22–5.81)
RDW: 16.4 % — ABNORMAL HIGH (ref 11.5–15.5)
WBC: 7.1 10*3/uL (ref 4.0–10.5)
nRBC: 0 % (ref 0.0–0.2)

## 2023-02-17 LAB — ETHANOL: Alcohol, Ethyl (B): 381 mg/dL (ref <10)

## 2023-02-17 NOTE — ED Notes (Signed)
Patient not to be seen in bed. Patient nowhere to be found. Unable to provide discharge papers to patient.

## 2023-02-17 NOTE — ED Provider Notes (Signed)
[facility]EM[facility] DEPT [facility] Emergency Department Provider Note MRN:  [id number]  Arrival date & time: 02/17/23     Chief Complaint   Alcohol Intoxication   History of Present Illness   Gary Mclaughlin is a 52 y.o. year-old male presents to the ED with chief complaint of alcohol intoxication.  He states that he wants to have detox from alcohol.  He states that he has been drinking a lot of alcohol.  He denies any recent illness.  Denies any SI.  Denies any other associated symptoms.  History provided by patient.   Review of Systems  Pertinent positive and negative review of systems noted in HPI.    Physical Exam   Vitals:   02/17/23 0012 02/17/23 0534  BP: 98/84 101/64  Pulse: 78 67  Resp: 18 20  Temp: (!) 97 F (36.1 C) 98 F (36.7 C)  SpO2: 100% 97%    CONSTITUTIONAL:  intoxicated-appearing, NAD NEURO:  Alert and oriented x 3, CN 3-12 grossly intact EYES:  eyes equal and reactive ENT/NECK:  Supple, no stridor  CARDIO:  normal rate, regular rhythm, appears well-perfused  PULM:  No respiratory distress, CTAB GI/GU:  non-distended,  MSK/SPINE:  No gross deformities, no edema, moves all extremities  SKIN:  no rash, atraumatic   *Additional and/or pertinent findings included in MDM below  Diagnostic and Interventional Summary    EKG Interpretation Date/Time:    Ventricular Rate:    PR Interval:    QRS Duration:    QT Interval:    QTC Calculation:   R Axis:      Text Interpretation:         Labs Reviewed  COMPREHENSIVE METABOLIC PANEL - Abnormal; Notable for the following components:      Result Value   AST 43 (*)    All other components within normal limits  ETHANOL - Abnormal; Notable for the following components:   Alcohol, Ethyl (B) 381 (*)    All other components within normal limits  CBC - Abnormal; Notable for the following components:   RDW 16.4 (*)    Platelets 521 (*)    All other components within normal limits  RAPID URINE DRUG  SCREEN, HOSP PERFORMED    No orders to display    Medications - No data to display   Procedures  /  Critical Care Procedures  ED Course and Medical Decision Making  I have reviewed the triage vital signs, the nursing notes, and pertinent available records from the EMR.  Social Determinants Affecting Complexity of Care: Patient has no clinically significant social determinants affecting this chief complaint..   ED Course:    Medical Decision Making Patient here with alcohol intoxication.  He states that he wants to detox.  I discussed with patient that detox isn't typically done in the ER, but that I will give him resources so he can get to the right place for detox.    He doesn't appear to be in any distress. He has been observed for about 6 hours in the ED. He doesn't have any symptoms of acute withdrawal.  I don't think that he requires any further workup or monitoring.  Amount and/or Complexity of Data Reviewed Labs: ordered.         Consultants: No consultations were needed in caring for this patient.   Treatment and Plan: Emergency department workup does not suggest an emergent condition requiring admission or immediate intervention beyond  what has been performed at this time. The patient  is safe for discharge and has  been instructed to return immediately for worsening symptoms, change in  symptoms or any other concerns    Final Clinical Impressions(s) / ED Diagnoses     ICD-10-CM   1. Alcoholic intoxication without complication [facility])  W41.324       ED Discharge Orders     None         Discharge Instructions Discussed with and Provided to Patient:   Discharge Instructions   None      Roxy Horseman, PA-C 02/17/23 4010    Gloris Manchester, MD 02/18/23 [phone]

## 2023-02-17 NOTE — ED Triage Notes (Signed)
Pt presents wanting detox from alcohol. Is using racial slurrs in triage. Said that he has been drinking non stop for 2 days. Calling everyone names. Was advised to stop using them. Did cooperate with labs.

## 2023-03-15 ENCOUNTER — Other Ambulatory Visit: Payer: Self-pay

## 2023-03-15 ENCOUNTER — Encounter (HOSPITAL_COMMUNITY): Payer: Self-pay | Admitting: Pharmacy Technician

## 2023-03-15 ENCOUNTER — Emergency Department (HOSPITAL_COMMUNITY)
Admission: EM | Admit: 2023-03-15 | Discharge: 2023-03-15 | Disposition: A | Discharge status: Home / Self Care | Payer: MEDICAID | Attending: Emergency Medicine | Admitting: Emergency Medicine

## 2023-03-15 DIAGNOSIS — M7989 Other specified soft tissue disorders: Secondary | ICD-10-CM | POA: Diagnosis present

## 2023-03-15 DIAGNOSIS — E871 Hypo-osmolality and hyponatremia: Secondary | ICD-10-CM | POA: Insufficient documentation

## 2023-03-15 DIAGNOSIS — E878 Other disorders of electrolyte and fluid balance, not elsewhere classified: Secondary | ICD-10-CM | POA: Diagnosis not present

## 2023-03-15 DIAGNOSIS — D649 Anemia, unspecified: Secondary | ICD-10-CM | POA: Diagnosis not present

## 2023-03-15 DIAGNOSIS — R944 Abnormal results of kidney function studies: Secondary | ICD-10-CM | POA: Diagnosis not present

## 2023-03-15 DIAGNOSIS — L03115 Cellulitis of right lower limb: Secondary | ICD-10-CM | POA: Diagnosis not present

## 2023-03-15 LAB — CBC WITH DIFFERENTIAL/PLATELET
Abs Immature Granulocytes: 0.02 10*3/uL (ref 0.00–0.07)
Basophils Absolute: 0.1 10*3/uL (ref 0.0–0.1)
Basophils Relative: 1 %
Eosinophils Absolute: 0.1 10*3/uL (ref 0.0–0.5)
Eosinophils Relative: 1 %
HCT: 36.6 % — ABNORMAL LOW (ref 39.0–52.0)
Hemoglobin: 12.6 g/dL — ABNORMAL LOW (ref 13.0–17.0)
Immature Granulocytes: 0 %
Lymphocytes Relative: 19 %
Lymphs Abs: 1.3 10*3/uL (ref 0.7–4.0)
MCH: 32.1 pg (ref 26.0–34.0)
MCHC: 34.4 g/dL (ref 30.0–36.0)
MCV: 93.4 fL (ref 80.0–100.0)
Monocytes Absolute: 0.5 10*3/uL (ref 0.1–1.0)
Monocytes Relative: 8 %
Neutro Abs: 4.9 10*3/uL (ref 1.7–7.7)
Neutrophils Relative %: 71 %
Platelets: 271 10*3/uL (ref 150–400)
RBC: 3.92 MIL/uL — ABNORMAL LOW (ref 4.22–5.81)
RDW: 17.1 % — ABNORMAL HIGH (ref 11.5–15.5)
WBC: 6.8 10*3/uL (ref 4.0–10.5)
nRBC: 0 % (ref 0.0–0.2)

## 2023-03-15 LAB — ETHANOL: Alcohol, Ethyl (B): 366 mg/dL (ref <10)

## 2023-03-15 LAB — BASIC METABOLIC PANEL
Anion gap: 10 (ref 5–15)
BUN: 5 mg/dL — ABNORMAL LOW (ref 6–20)
CO2: 27 mmol/L (ref 22–32)
Calcium: 9.1 mg/dL (ref 8.9–10.3)
Chloride: 92 mmol/L — ABNORMAL LOW (ref 98–111)
Creatinine, Ser: 0.55 mg/dL — ABNORMAL LOW (ref 0.61–1.24)
GFR, Estimated: >60 mL/min (ref >60)
Glucose, Bld: 99 mg/dL (ref 70–99)
Potassium: 4 mmol/L (ref 3.5–5.1)
Sodium: 129 mmol/L — ABNORMAL LOW (ref 135–145)

## 2023-03-15 MED ORDER — CLINDAMYCIN HCL 150 MG PO CAPS
450.0000 mg | ORAL_CAPSULE | Freq: Three times a day (TID) | ORAL | 0 refills | Status: AC
Start: 2023-03-15 — End: 2023-03-22

## 2023-03-15 MED ORDER — CLINDAMYCIN HCL 300 MG PO CAPS
450.0000 mg | ORAL_CAPSULE | Freq: Once | ORAL | Status: AC
  Administered 2023-03-15: 450 mg via ORAL
  Filled 2023-03-15: qty 1

## 2023-03-15 NOTE — ED Triage Notes (Signed)
Pt here with reports of RLE edema for "a while". States the pain and swelling have gotten to where he can't stand it and is having difficulty with ADL's due to the pain. Pt drinks daily, last drink just PTA.

## 2023-03-15 NOTE — ED Provider Notes (Signed)
[place] EMERGENCY DEPARTMENT AT [facility] Provider Note   CSN: [id number] Arrival date & time: 03/15/23  1449     History  Chief Complaint  Patient presents with   Leg Swelling    Gary Mclaughlin is a 52 y.o. male past medical history significant for alcohol dependence with intoxication presents today for right lower extremity edema for "a while ".  Patient states leg is painful and the swelling has gotten to where it is difficult to stand and perform ADLs due to pain.  Patient drinks daily his last drink was just prior to arrival.  Patient denies any trauma to this area.  Patient denies fever, nausea, vomiting, abdominal pain, headache, or numbness.  HPI     Home Medications Prior to Admission medications   Medication Sig Start Date End Date Taking? Authorizing Provider  clindamycin (CLEOCIN) 150 MG capsule Take 3 capsules (450 mg total) by mouth 3 (three) times daily for 7 days. 03/15/23 03/22/23 Yes Dolphus Jenny, PA-C  hydrOXYzine (ATARAX) 25 MG tablet Take 1 tablet (25 mg total) by mouth every 6 (six) hours as needed for anxiety. 11/21/22   Tomie China, MD  traZODone (DESYREL) 50 MG tablet Take 1 tablet (50 mg total) by mouth at bedtime as needed for sleep. 11/21/22 12/21/22  Tomie China, MD      Allergies    Patient has no known allergies.    Review of Systems   Review of Systems  Skin:  Positive for rash.    Physical Exam Updated Vital Signs BP 103/64   Pulse 80   Temp 97.7 F (36.5 C)   Resp 16   SpO2 94%  Physical Exam Vitals and nursing note reviewed.  Constitutional:      General: He is not in acute distress.    Appearance: He is well-developed.  HENT:     Head: Normocephalic and atraumatic.  Eyes:     Conjunctiva/sclera: Conjunctivae normal.  Cardiovascular:     Rate and Rhythm: Normal rate and regular rhythm.     Heart sounds: No murmur heard. Pulmonary:     Effort: Pulmonary effort is normal. No respiratory distress.      Breath sounds: Normal breath sounds.  Abdominal:     Palpations: Abdomen is soft.     Tenderness: There is no abdominal tenderness.  Musculoskeletal:        General: Swelling and tenderness present.     Cervical back: Neck supple.     Right lower leg: No edema.     Comments: Right lower leg swelling with erythema above ankle and below knee on the anterior aspect.  Patient has tenderness to palpation.  Patient is neurovascularly intact.  Approximately 8 cm area of induration without fluctuance.  Skin:    General: Skin is warm and dry.     Capillary Refill: Capillary refill takes less than 2 seconds.  Neurological:     Mental Status: He is alert.  Psychiatric:        Mood and Affect: Mood normal.     ED Results / Procedures / Treatments   Labs (all labs ordered are listed, but only abnormal results are displayed) Labs Reviewed  CBC WITH DIFFERENTIAL/PLATELET - Abnormal; Notable for the following components:      Result Value   RBC 3.92 (*)    Hemoglobin 12.6 (*)    HCT 36.6 (*)    RDW 17.1 (*)    All other components within normal limits  ETHANOL -  Abnormal; Notable for the following components:   Alcohol, Ethyl (B) 366 (*)    All other components within normal limits  BASIC METABOLIC PANEL - Abnormal; Notable for the following components:   Sodium 129 (*)    Chloride 92 (*)    BUN 5 (*)    Creatinine, Ser 0.55 (*)    All other components within normal limits    EKG None  Radiology No results found.  Procedures Procedures    Medications Ordered in ED Medications  clindamycin (CLEOCIN) capsule 450 mg (has no administration in time range)    ED Course/ Medical Decision Making/ A&P                                 Medical Decision Making Amount and/or Complexity of Data Reviewed Labs: ordered.   This patient presents to the ED with chief complaint(s) of right lower extremity pain with pertinent past medical history of alcohol dependence which further  complicates the presenting complaint. The complaint involves an extensive differential diagnosis and also carries with it a high risk of complications and morbidity.    The differential diagnosis includes cellulitis   ED Course and Reassessment: Patient given meal and eating comfortably in room  Independent labs interpretation:  The following labs were independently interpreted:  CBC: Mild anemia, no leukocytosis BMP: Hyponatremia 129, hypochloremia at 92, decreased BUN at  5 Ethanol: 366  Consultation: - Consulted or discussed management/test interpretation w/ external professional: None  Consideration for admission or further workup: Patient considered for admission further workup however patient's vital signs been stable throughout ER stay.  Patient's physical exam and labs have all been reassuring.  Patient should follow-up with PCP as needed.        Final Clinical Impression(s) / ED Diagnoses Final diagnoses:  Cellulitis of right lower extremity    Rx / DC Orders ED Discharge Orders          Ordered    clindamycin (CLEOCIN) 150 MG capsule  3 times daily        03/15/23 1751              Dolphus Jenny, PA-C 03/15/23 1752    Bethann Berkshire, MD 03/17/23 1313

## 2023-03-15 NOTE — Discharge Instructions (Signed)
Today you were seen for cellulitis.  Please pick up your antibiotics and take as prescribed.  Thank you for letting us treat you today. After performing a physical exam and reviewing your labs, I feel you are safe to go home. Please follow up with your PCP in the next several days and provide them with your records from this visit. Return to the Emergency Room if pain becomes severe or symptoms worsen.

## 2023-03-15 NOTE — ED Notes (Signed)
Critical alcohol of 366    report given to [facility]

## 2023-04-09 ENCOUNTER — Ambulatory Visit (HOSPITAL_COMMUNITY)
Admission: EM | Admit: 2023-04-09 | Discharge: 2023-04-10 | Disposition: A | Discharge status: Other Acute Inpatient Hospital | Payer: MEDICAID | Attending: Psychiatry | Admitting: Psychiatry

## 2023-04-09 ENCOUNTER — Other Ambulatory Visit: Payer: Self-pay

## 2023-04-09 DIAGNOSIS — Z5902 Unsheltered homelessness: Secondary | ICD-10-CM | POA: Insufficient documentation

## 2023-04-09 DIAGNOSIS — F1020 Alcohol dependence, uncomplicated: Secondary | ICD-10-CM | POA: Insufficient documentation

## 2023-04-09 DIAGNOSIS — F17200 Nicotine dependence, unspecified, uncomplicated: Secondary | ICD-10-CM

## 2023-04-09 DIAGNOSIS — F32A Depression, unspecified: Secondary | ICD-10-CM | POA: Insufficient documentation

## 2023-04-09 DIAGNOSIS — F17210 Nicotine dependence, cigarettes, uncomplicated: Secondary | ICD-10-CM | POA: Insufficient documentation

## 2023-04-09 DIAGNOSIS — R45851 Suicidal ideations: Secondary | ICD-10-CM | POA: Insufficient documentation

## 2023-04-09 DIAGNOSIS — F419 Anxiety disorder, unspecified: Secondary | ICD-10-CM | POA: Insufficient documentation

## 2023-04-09 DIAGNOSIS — Z79899 Other long term (current) drug therapy: Secondary | ICD-10-CM | POA: Insufficient documentation

## 2023-04-09 LAB — COMPREHENSIVE METABOLIC PANEL
ALT: 32 U/L (ref 0–44)
AST: 62 U/L — ABNORMAL HIGH (ref 15–41)
Albumin: 3.8 g/dL (ref 3.5–5.0)
Alkaline Phosphatase: 90 U/L (ref 38–126)
Anion gap: 12 (ref 5–15)
BUN: 9 mg/dL (ref 6–20)
CO2: 25 mmol/L (ref 22–32)
Calcium: 10.1 mg/dL (ref 8.9–10.3)
Chloride: 99 mmol/L (ref 98–111)
Creatinine, Ser: 0.7 mg/dL (ref 0.61–1.24)
GFR, Estimated: >60 mL/min (ref >60)
Glucose, Bld: 101 mg/dL — ABNORMAL HIGH (ref 70–99)
Potassium: 4.4 mmol/L (ref 3.5–5.1)
Sodium: 136 mmol/L (ref 135–145)
Total Bilirubin: 0.4 mg/dL (ref <1.2)
Total Protein: 7.6 g/dL (ref 6.5–8.1)

## 2023-04-09 LAB — CBC WITH DIFFERENTIAL/PLATELET
Abs Immature Granulocytes: 0.04 10*3/uL (ref 0.00–0.07)
Basophils Absolute: 0.1 10*3/uL (ref 0.0–0.1)
Basophils Relative: 2 %
Eosinophils Absolute: 0.3 10*3/uL (ref 0.0–0.5)
Eosinophils Relative: 6 %
HCT: 36.6 % — ABNORMAL LOW (ref 39.0–52.0)
Hemoglobin: 12.6 g/dL — ABNORMAL LOW (ref 13.0–17.0)
Immature Granulocytes: 1 %
Lymphocytes Relative: 22 %
Lymphs Abs: 1 10*3/uL (ref 0.7–4.0)
MCH: 32.8 pg (ref 26.0–34.0)
MCHC: 34.4 g/dL (ref 30.0–36.0)
MCV: 95.3 fL (ref 80.0–100.0)
Monocytes Absolute: 0.2 10*3/uL (ref 0.1–1.0)
Monocytes Relative: 5 %
Neutro Abs: 2.8 10*3/uL (ref 1.7–7.7)
Neutrophils Relative %: 64 %
Platelets: 319 10*3/uL (ref 150–400)
RBC: 3.84 MIL/uL — ABNORMAL LOW (ref 4.22–5.81)
RDW: 16.7 % — ABNORMAL HIGH (ref 11.5–15.5)
WBC: 4.4 10*3/uL (ref 4.0–10.5)
nRBC: 0 % (ref 0.0–0.2)

## 2023-04-09 LAB — LIPID PANEL
Cholesterol: 235 mg/dL — ABNORMAL HIGH (ref 0–200)
HDL: 119 mg/dL (ref >40)
LDL Cholesterol: 93 mg/dL (ref 0–99)
Total CHOL/HDL Ratio: 2 {ratio}
Triglycerides: 116 mg/dL (ref <150)
VLDL: 23 mg/dL (ref 0–40)

## 2023-04-09 LAB — POCT URINE DRUG SCREEN - MANUAL ENTRY (I-SCREEN)
POC Amphetamine UR: NOT DETECTED
POC Buprenorphine (BUP): NOT DETECTED
POC Cocaine UR: NOT DETECTED
POC Marijuana UR: NOT DETECTED
POC Methadone UR: NOT DETECTED
POC Methamphetamine UR: NOT DETECTED
POC Morphine: NOT DETECTED
POC Oxazepam (BZO): NOT DETECTED
POC Oxycodone UR: NOT DETECTED
POC Secobarbital (BAR): NOT DETECTED

## 2023-04-09 LAB — TSH: TSH: 1.422 u[IU]/mL (ref 0.350–4.500)

## 2023-04-09 LAB — HEMOGLOBIN A1C
Hgb A1c MFr Bld: 5.4 % (ref 4.8–5.6)
Mean Plasma Glucose: 108.28 mg/dL

## 2023-04-09 LAB — ETHANOL: Alcohol, Ethyl (B): 349 mg/dL (ref <10)

## 2023-04-09 MED ORDER — ADULT MULTIVITAMIN W/MINERALS CH
1.0000 | ORAL_TABLET | Freq: Every day | ORAL | Status: DC
  Administered 2023-04-09: 1 via ORAL
  Filled 2023-04-09: qty 1

## 2023-04-09 MED ORDER — THIAMINE HCL 100 MG/ML IJ SOLN
100.0000 mg | Freq: Once | INTRAMUSCULAR | Status: AC
  Administered 2023-04-09: 100 mg via INTRAMUSCULAR
  Filled 2023-04-09: qty 2

## 2023-04-09 MED ORDER — ADULT MULTIVITAMIN W/MINERALS CH
1.0000 | ORAL_TABLET | Freq: Every day | ORAL | Status: DC
  Administered 2023-04-10: 1 via ORAL
  Filled 2023-04-09: qty 1

## 2023-04-09 MED ORDER — LORAZEPAM 1 MG PO TABS
1.0000 mg | ORAL_TABLET | Freq: Three times a day (TID) | ORAL | Status: DC
Start: 2023-04-10 — End: 2023-04-10

## 2023-04-09 MED ORDER — LORAZEPAM 1 MG PO TABS
1.0000 mg | ORAL_TABLET | Freq: Four times a day (QID) | ORAL | Status: DC
  Administered 2023-04-10: 1 mg via ORAL
  Filled 2023-04-09 (×2): qty 1

## 2023-04-09 MED ORDER — HYDROXYZINE HCL 25 MG PO TABS: 25.0000 mg | ORAL_TABLET | Freq: Four times a day (QID) | ORAL | Status: DC | PRN

## 2023-04-09 MED ORDER — LORAZEPAM 1 MG PO TABS: 1.0000 mg | ORAL_TABLET | Freq: Two times a day (BID) | ORAL | Status: DC

## 2023-04-09 MED ORDER — LORAZEPAM 1 MG PO TABS
1.0000 mg | ORAL_TABLET | Freq: Four times a day (QID) | ORAL | Status: DC | PRN
  Administered 2023-04-10: 1 mg via ORAL
  Filled 2023-04-09: qty 1

## 2023-04-09 MED ORDER — ONDANSETRON 4 MG PO TBDP: 4.0000 mg | ORAL_TABLET | Freq: Four times a day (QID) | ORAL | Status: DC | PRN

## 2023-04-09 MED ORDER — LORAZEPAM 1 MG PO TABS: 1.0000 mg | ORAL_TABLET | Freq: Every day | ORAL | Status: DC

## 2023-04-09 MED ORDER — CLONIDINE HCL 0.1 MG PO TABS: 0.1000 mg | ORAL_TABLET | Freq: Two times a day (BID) | ORAL | Status: DC | PRN

## 2023-04-09 MED ORDER — THIAMINE MONONITRATE 100 MG PO TABS
100.0000 mg | ORAL_TABLET | Freq: Every day | ORAL | Status: DC
  Administered 2023-04-10: 100 mg via ORAL
  Filled 2023-04-09: qty 1

## 2023-04-09 MED ORDER — ALUM & MAG HYDROXIDE-SIMETH 200-200-20 MG/5ML PO SUSP: 30.0000 mL | ORAL | Status: DC | PRN

## 2023-04-09 MED ORDER — LOPERAMIDE HCL 2 MG PO CAPS: 2.0000 mg | ORAL_CAPSULE | ORAL | Status: DC | PRN

## 2023-04-09 MED ORDER — MAGNESIUM HYDROXIDE 400 MG/5ML PO SUSP: 30.0000 mL | Freq: Every day | ORAL | Status: DC | PRN

## 2023-04-09 MED ORDER — TRAZODONE HCL 50 MG PO TABS
50.0000 mg | ORAL_TABLET | Freq: Every evening | ORAL | Status: DC | PRN
  Administered 2023-04-09: 50 mg via ORAL
  Filled 2023-04-09: qty 1

## 2023-04-09 MED ORDER — GABAPENTIN 100 MG PO CAPS
200.0000 mg | ORAL_CAPSULE | Freq: Two times a day (BID) | ORAL | Status: DC
  Administered 2023-04-09 – 2023-04-10 (×2): 200 mg via ORAL
  Filled 2023-04-09 (×2): qty 2

## 2023-04-09 MED ORDER — ACETAMINOPHEN 325 MG PO TABS: 650.0000 mg | ORAL_TABLET | Freq: Four times a day (QID) | ORAL | Status: DC | PRN

## 2023-04-09 MED ORDER — HYDROXYZINE HCL 25 MG PO TABS: 25.0000 mg | ORAL_TABLET | Freq: Three times a day (TID) | ORAL | Status: DC | PRN

## 2023-04-09 MED ORDER — NICOTINE 21 MG/24HR TD PT24
21.0000 mg | MEDICATED_PATCH | Freq: Every day | TRANSDERMAL | Status: DC
  Filled 2023-04-09: qty 1

## 2023-04-09 MED ORDER — THIAMINE MONONITRATE 100 MG PO TABS: 100.0000 mg | ORAL_TABLET | Freq: Every day | ORAL | Status: DC

## 2023-04-09 MED ORDER — LORAZEPAM 1 MG PO TABS
1.0000 mg | ORAL_TABLET | Freq: Four times a day (QID) | ORAL | Status: DC | PRN
  Administered 2023-04-09: 1 mg via ORAL
  Filled 2023-04-09: qty 1

## 2023-04-09 MED ORDER — CLONIDINE HCL 0.1 MG PO TABS: 0.1000 mg | ORAL_TABLET | Freq: Once | ORAL | Status: DC

## 2023-04-09 NOTE — ED Notes (Signed)
Patient resting quietly in bed with eyes closed. Respirations equal and unlabored, skin warm and dry, NAD. Routine safety checks conducted according to facility protocol. Will continue to monitor for safety.  

## 2023-04-09 NOTE — ED Notes (Signed)
NP made aware of pt has a critical alcohol level of 349

## 2023-04-09 NOTE — Progress Notes (Signed)
   04/09/23 1659  [facility] Triage Screening (Walk-ins at [facility] only)  How Did You Hear About [place]? Legal System  What Is the Reason for Your Visit/Call Today? Patient reports that [employer] escorted him here today for suicidal ideations and ETOH use. Pt reprots he had 3-40ounces of beer today and the officer took his other beer today.  How Long Has This Been Causing You Problems? > than 6 months  Have You Recently Had Any Thoughts About Hurting Yourself? Yes  How long ago did you have thoughts about hurting yourself? long time  Are You Planning to Commit Suicide/Harm Yourself At This time? No  Have you Recently Had Thoughts About Hurting Someone [name]? No  Are You Planning To Harm Someone At This Time? No  Physical Abuse Denies  Verbal Abuse Denies  Sexual Abuse Denies  Exploitation of patient/patient's resources Denies  Self-Neglect Denies  Are you currently experiencing any auditory, visual or other hallucinations? No  Have You Used Any Alcohol or Drugs in the Past 24 Hours? Yes  How long ago did you use Drugs or Alcohol? before coming to [facility]  What Did You Use and How Much? 3-40ounce beer  Do you have any current medical co-morbidities that require immediate attention? No  Clinician description of patient physical appearance/behavior: intoxicated  What Do You Feel Would Help You the Most Today? Treatment for Depression or other mood problem;Alcohol or Drug Use Treatment  If access to [facility] was not available, would you have sought care in the Emergency Department? No  Determination of Need Urgent (48 hours)  Options For Referral Medication Management;BH Urgent Care;Facility-Based Crisis  Determination of Need filed? Yes

## 2023-04-09 NOTE — ED Notes (Signed)
Pt laying on recliner has c/o being anxious due to his drinking requesting medication(see Mar)he denies SI/HI/AVH and pain alert and orient x 4 informed pt he will move to [facility] when we get orders will continue to monitor for safety

## 2023-04-09 NOTE — ED Notes (Signed)
Critical alcohol lab 349

## 2023-04-09 NOTE — ED Notes (Signed)
Patient is alert & oriented x 4. He endorses SI with a plan to cut his wrists. Patient demonstrated how he would do this. States he would start at his forearm and cut down to his wrist. Not circumferential. Patient states he had every intent on doing this today. Stated "when I make my mind up, there's no stopping me." He denies HI or AVH. Skin check conducted by this RN and Trinna Post, LPN. There is an healing wound on pt's left shin. He states it was originally an abscess in which he went to the ED and had it lanced. This was approximately 2 months ago. It has some drainage that looks to be crusted over. Patient's belongings placed in locker #. Patient oriented to the unit and provided sandwiches and a beverage. Patient denies any further needs at this time. We will continue to monitor for safety.
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
## 2023-04-09 NOTE — ED Provider Notes (Signed)
[facility] Continuous Assessment Admission H&P  Date: 04/09/23 [name] MRN: [id number] Chief Complaint: "Detox and suicidal"  Diagnoses:  Final diagnoses:  Alcohol use disorder, severe, dependence (HCC)  Passive suicidal ideations  Tobacco use disorder    HPI: Gary Mclaughlin 52 y.o., male patient with a history of alcohol use disorder, severe, passive suicidal ideations, and  presented to [facility] as a walk in voluntarily  unaccompanied by with complaints of needing detox and suicidal ideations.  Gary Mclaughlin, 52 y.o., male patient seen face to face by this provider, consulted with Dr. Enedina Finner; and chart reviewed on 04/09/23.    On evaluation Gary Mclaughlin reports that he is wishing to detox from alcohol.  Patient reports that he last drink for (4) 40 ounces of beer today.  Patient appears disheveled, malodorous and reports that he is homeless and has been homeless for several years.  He reports that he lives in a tent.  Patient reports that he drinks an average of 4 to 6 40 ounces of beer on a daily basis. He denies any Gary substance use. Patient endorses depression and recurrent thoughts of suicide. Patient states, "today is a great day to end it all". However, patient contracts for safety while here at [facility] and throughout admission.  Denies auditory and visual hallucinations, delusions, or paranoid thoughts.   During evaluation Gary Mclaughlin is laying on (position) in no acute distress, although appears to be intoxicated.   He  is alert, oriented x 4, calm, cooperative and attentive.  His mood is euthymic with congruent affect.  His speech is loud in tone, normal rate and coherent. Behavior (reports of depression are incongruent with current mood and affect).   Objectively there is no evidence of psychosis/mania or delusional thinking.  Patient endorses SI without plan, able to contract for safety while on the unit. Denies self-harm, homicidal ideation. Patient is being  admitted to observation while awaiting labs to result and plan to transfer to [facility] once medically cleared and bed availability.  Total Time spent with patient: 30 minutes  Musculoskeletal  Strength & Muscle Tone: within normal limits Gait & Station: normal Patient leans: N/A  Psychiatric Specialty Exam  Presentation General Appearance:  Disheveled  Eye Contact: Minimal  Speech: Clear and Coherent  Speech Volume: Increased  Handedness: Right   Mood and Affect  Mood: Euthymic  Affect: Appropriate   Thought Process  Thought Processes: Coherent  Descriptions of Associations:Circumstantial  Orientation:Full (Time, Place and Person)  Thought Content:WDL  Diagnosis of Schizophrenia or Schizoaffective disorder in past: No   Hallucinations:Hallucinations: None  Ideas of Reference:None  Suicidal Thoughts:Suicidal Thoughts: Yes, Passive SI Active Intent and/or Plan: Without Plan SI Passive Intent and/or Plan: Without Plan  Homicidal Thoughts:Homicidal Thoughts: No   Sensorium  Memory: Immediate Good; Recent Good; Remote Good  Judgment: Fair  Insight: Present   Executive Functions  Concentration: Fair  Attention Span: Fair  Recall: Fair  Fund of Knowledge: Fair  Language: Fair   Psychomotor Activity  Psychomotor Activity: Psychomotor Activity: Normal   Assets  Assets: Desire for Improvement   Sleep  Sleep: Sleep: Good Number of Hours of Sleep: 5   Nutritional Assessment (For OBS and FBC admissions only) Has the patient had a weight loss or gain of 10 pounds or more in the last 3 months?: No Has the patient had a decrease in food intake/or appetite?: No Does the patient have dental problems?: No Does the patient have eating habits or behaviors  that may be indicators of an eating disorder including binging or inducing vomiting?: No Has the patient recently lost weight without trying?: 0 Has the patient been eating poorly  because of a decreased appetite?: 0 Malnutrition Screening Tool Score: 0    Physical Exam Constitutional:      General: He is not in acute distress.    Appearance: He is not toxic-appearing.     Comments: Disheveled, malodorous  HENT:     Head: Normocephalic and atraumatic.  Eyes:     Extraocular Movements: Extraocular movements intact.     Pupils: Pupils are equal, round, and reactive to light.  Pulmonary:     Effort: Pulmonary effort is normal.  Skin:    General: Skin is warm and dry.  Neurological:     General: No focal deficit present.     Review of Systems  Psychiatric/Behavioral:  Positive for depression, substance abuse and suicidal ideas.     Blood pressure 120/86, pulse 93, temperature 98.1 F (36.7 C), temperature source Oral, resp. rate 20, SpO2 95%. There is no height or weight on file to calculate BMI.  Past Psychiatric History:   Is the patient at risk to self? Yes  Has the patient been a risk to self in the past 6 months? Yes .    Has the patient been a risk to self within the distant past? Yes   Is the patient a risk to others? No   Has the patient been a risk to others in the past 6 months? No   Has the patient been a risk to others within the distant past? No   Past Medical History:  COPD, Subarachnoid Hemorrhage, tobacco abuse, seizure (self-detox from alcohol several years back)   Family History: no pertinent family history provided during this encounter  Social History:  52 y/o male, homeless, works in [employer]   Last Labs:  Admission on 04/09/2023  Component Date Value Ref Range Status   WBC 04/09/2023 4.4  4.0 - 10.5 K/uL Final   RBC 04/09/2023 3.84 (L)  4.22 - 5.81 MIL/uL Final   Hemoglobin 04/09/2023 12.6 (L)  13.0 - 17.0 g/dL Final   HCT 11/91/4782 36.6 (L)  39.0 - 52.0 % Final   MCV 04/09/2023 95.3  80.0 - 100.0 fL Final   MCH 04/09/2023 32.8  26.0 - 34.0 pg Final   MCHC 04/09/2023 34.4  30.0 - 36.0 g/dL Final   RDW 95/62/1308 16.7  (H)  11.5 - 15.5 % Final   Platelets 04/09/2023 319  150 - 400 K/uL Final   nRBC 04/09/2023 0.0  0.0 - 0.2 % Final   Neutrophils Relative % 04/09/2023 64  % Final   Neutro Abs 04/09/2023 2.8  1.7 - 7.7 K/uL Final   Lymphocytes Relative 04/09/2023 22  % Final   Lymphs Abs 04/09/2023 1.0  0.7 - 4.0 K/uL Final   Monocytes Relative 04/09/2023 5  % Final   Monocytes Absolute 04/09/2023 0.2  0.1 - 1.0 K/uL Final   Eosinophils Relative 04/09/2023 6  % Final   Eosinophils Absolute 04/09/2023 0.3  0.0 - 0.5 K/uL Final   Basophils Relative 04/09/2023 2  % Final   Basophils Absolute 04/09/2023 0.1  0.0 - 0.1 K/uL Final   Immature Granulocytes 04/09/2023 1  % Final   Abs Immature Granulocytes 04/09/2023 0.04  0.00 - 0.07 K/uL Final   Performed at [facility], [address], [place], [place] [address]   Sodium 04/09/2023 136  135 -  145 mmol/L Final   Potassium 04/09/2023 4.4  3.5 - 5.1 mmol/L Final   Chloride 04/09/2023 99  98 - 111 mmol/L Final   CO2 04/09/2023 25  22 - 32 mmol/L Final   Glucose, Bld 04/09/2023 101 (H)  70 - 99 mg/dL Final   Glucose reference range applies only to samples taken after fasting for at least 8 hours.   BUN 04/09/2023 9  6 - 20 mg/dL Final   Creatinine, Ser 04/09/2023 0.70  0.61 - 1.24 mg/dL Final   Calcium 16/02/9603 10.1  8.9 - 10.3 mg/dL Final   Total Protein 54/01/8118 7.6  6.5 - 8.1 g/dL Final   Albumin 14/78/2956 3.8  3.5 - 5.0 g/dL Final   AST 21/30/8657 62 (H)  15 - 41 U/L Final   ALT 04/09/2023 32  0 - 44 U/L Final   Alkaline Phosphatase 04/09/2023 90  38 - 126 U/L Final   Total Bilirubin 04/09/2023 0.4  <1.2 mg/dL Final   GFR, Estimated 04/09/2023 >60  >60 mL/min Final   Comment: (NOTE) Calculated using the CKD-EPI Creatinine Equation (2021)    Anion gap 04/09/2023 12  5 - 15 Final   Performed at [facility], [address] [place], [place] [address]   Hgb A1c MFr Bld 04/09/2023 5.4  4.8 - 5.6 % Final   Comment: (NOTE) Pre diabetes:           5.7%-6.4%  Diabetes:              >6.4%  Glycemic control for   <7.0% adults with diabetes    Mean Plasma Glucose 04/09/2023 108.28  mg/dL Final   Performed at [facility], [address] [place], [place] [address]   Alcohol, Ethyl (B) 04/09/2023 349 (HH)  <10 mg/dL Final   Comment: CRITICAL RESULT CALLED TO, READ BACK BY AND VERIFIED WITH Carolynn Comment RN 04/09/2023 2003 BNUNNERY MT (NOTE) Lowest detectable limit for serum alcohol is 10 mg/dL.  For medical purposes only. Performed at [facility], [address], [place], [place] [address]    Cholesterol 04/09/2023 235 (H)  0 - 200 mg/dL Final   Triglycerides 40/02/2724 116  <150 mg/dL Final   HDL 36/64/4034 119  >40 mg/dL Final   Total CHOL/HDL Ratio 04/09/2023 2.0  RATIO Final   VLDL 04/09/2023 23  0 - 40 mg/dL Final   LDL Cholesterol 04/09/2023 93  0 - 99 mg/dL Final   Comment:        Total Cholesterol/HDL:CHD Risk Coronary Heart Disease Risk Table                     Men   Women  1/2 Average Risk   3.4   3.3  Average Risk       5.0   4.4  2 X Average Risk   9.6   7.1  3 X Average Risk  23.4   11.0        Use the calculated Patient Ratio above and the CHD Risk Table to determine the patient's CHD Risk.        ATP III CLASSIFICATION (LDL):  <100     mg/dL   Optimal  742-595  mg/dL   Near or Above                    Optimal  130-159  mg/dL   Borderline  638-756  mg/dL   High  >433  mg/dL   Very High Performed at [facility], [address], [place], [place] [address]    TSH 04/09/2023 1.422  0.350 - 4.500 uIU/mL Final   Comment: Performed by a 3rd Generation assay with a functional sensitivity of <=0.01 uIU/mL. Performed at [facility], [address], [place], [place] [address]    POC Amphetamine UR 04/09/2023 None Detected  NONE DETECTED (Cut Off Level 1000 ng/mL) Final   POC Secobarbital (BAR) 04/09/2023 None Detected  NONE DETECTED (Cut Off Level 300 ng/mL) Final   POC  Buprenorphine (BUP) 04/09/2023 None Detected  NONE DETECTED (Cut Off Level 10 ng/mL) Final   POC Oxazepam (BZO) 04/09/2023 None Detected  NONE DETECTED (Cut Off Level 300 ng/mL) Final   POC Cocaine UR 04/09/2023 None Detected  NONE DETECTED (Cut Off Level 300 ng/mL) Final   POC Methamphetamine UR 04/09/2023 None Detected  NONE DETECTED (Cut Off Level 1000 ng/mL) Final   POC Morphine 04/09/2023 None Detected  NONE DETECTED (Cut Off Level 300 ng/mL) Final   POC Methadone UR 04/09/2023 None Detected  NONE DETECTED (Cut Off Level 300 ng/mL) Final   POC Oxycodone UR 04/09/2023 None Detected  NONE DETECTED (Cut Off Level 100 ng/mL) Final   POC Marijuana UR 04/09/2023 None Detected  NONE DETECTED (Cut Off Level 50 ng/mL) Final  Admission on 03/15/2023, Discharged on 03/15/2023  Component Date Value Ref Range Status   WBC 03/15/2023 6.8  4.0 - 10.5 K/uL Final   RBC 03/15/2023 3.92 (L)  4.22 - 5.81 MIL/uL Final   Hemoglobin 03/15/2023 12.6 (L)  13.0 - 17.0 g/dL Final   HCT 65/78/4696 36.6 (L)  39.0 - 52.0 % Final   MCV 03/15/2023 93.4  80.0 - 100.0 fL Final   MCH 03/15/2023 32.1  26.0 - 34.0 pg Final   MCHC 03/15/2023 34.4  30.0 - 36.0 g/dL Final   RDW 29/52/8413 17.1 (H)  11.5 - 15.5 % Final   Platelets 03/15/2023 271  150 - 400 K/uL Final   nRBC 03/15/2023 0.0  0.0 - 0.2 % Final   Neutrophils Relative % 03/15/2023 71  % Final   Neutro Abs 03/15/2023 4.9  1.7 - 7.7 K/uL Final   Lymphocytes Relative 03/15/2023 19  % Final   Lymphs Abs 03/15/2023 1.3  0.7 - 4.0 K/uL Final   Monocytes Relative 03/15/2023 8  % Final   Monocytes Absolute 03/15/2023 0.5  0.1 - 1.0 K/uL Final   Eosinophils Relative 03/15/2023 1  % Final   Eosinophils Absolute 03/15/2023 0.1  0.0 - 0.5 K/uL Final   Basophils Relative 03/15/2023 1  % Final   Basophils Absolute 03/15/2023 0.1  0.0 - 0.1 K/uL Final   Immature Granulocytes 03/15/2023 0  % Final   Abs Immature Granulocytes 03/15/2023 0.02  0.00 - 0.07 K/uL Final    Performed at [facility], [address], [place], [place] [address]   Alcohol, Ethyl (B) 03/15/2023 366 (HH)  <10 mg/dL Final   Comment: CRITICAL RESULT CALLED TO, READ BACK BY AND VERIFIED WITH HOPKINS,D. RN AT 1556 03/15/23 MULLINS,T (NOTE) Lowest detectable limit for serum alcohol is 10 mg/dL.  For medical purposes only. Performed at [facility], [address], [place], [place] [address]    Sodium 03/15/2023 129 (L)  135 - 145 mmol/L Final   Potassium 03/15/2023 4.0  3.5 - 5.1 mmol/L Final   Chloride 03/15/2023 92 (L)  98 - 111 mmol/L Final   CO2  03/15/2023 27  22 - 32 mmol/L Final   Glucose, Bld 03/15/2023 99  70 - 99 mg/dL Final   Glucose reference range applies only to samples taken after fasting for at least 8 hours.   BUN 03/15/2023 5 (L)  6 - 20 mg/dL Final   Creatinine, Ser 03/15/2023 0.55 (L)  0.61 - 1.24 mg/dL Final   Calcium 96/08/5407 9.1  8.9 - 10.3 mg/dL Final   GFR, Estimated 03/15/2023 >60  >60 mL/min Final   Comment: (NOTE) Calculated using the CKD-EPI Creatinine Equation (2021)    Anion gap 03/15/2023 10  5 - 15 Final   Performed at [facility], [address], [place], [place] [address]  Admission on 02/17/2023, Discharged on 02/17/2023  Component Date Value Ref Range Status   Sodium 02/17/2023 141  135 - 145 mmol/L Final   Potassium 02/17/2023 4.3  3.5 - 5.1 mmol/L Final   Chloride 02/17/2023 106  98 - 111 mmol/L Final   CO2 02/17/2023 23  22 - 32 mmol/L Final   Glucose, Bld 02/17/2023 87  70 - 99 mg/dL Final   Glucose reference range applies only to samples taken after fasting for at least 8 hours.   BUN 02/17/2023 12  6 - 20 mg/dL Final   Creatinine, Ser 02/17/2023 0.85  0.61 - 1.24 mg/dL Final   Calcium 47/82/9562 9.7  8.9 - 10.3 mg/dL Final   Total Protein 13/12/6576 8.0  6.5 - 8.1 g/dL Final   Albumin 46/96/2952 4.4  3.5 - 5.0 g/dL Final   AST 84/13/2440 43 (H)  15 - 41 U/L Final   ALT  02/17/2023 36  0 - 44 U/L Final   Alkaline Phosphatase 02/17/2023 78  38 - 126 U/L Final   Total Bilirubin 02/17/2023 0.4  0.3 - 1.2 mg/dL Final   GFR, Estimated 02/17/2023 >60  >60 mL/min Final   Comment: (NOTE) Calculated using the CKD-EPI Creatinine Equation (2021)    Anion gap 02/17/2023 12  5 - 15 Final   Performed at [facility], [address], [place], [place] [address]   Alcohol, Ethyl (B) 02/17/2023 381 (HH)  <10 mg/dL Final   Comment: CRITICAL RESULT CALLED TO, READ BACK BY AND VERIFIED WITH MERRICKS, L. RN @ 0100 02/17/23 JBUTLER (NOTE) Lowest detectable limit for serum alcohol is 10 mg/dL.  For medical purposes only. Performed at [facility], [address], [place], [place] [address]    WBC 02/17/2023 7.1  4.0 - 10.5 K/uL Final   RBC 02/17/2023 4.63  4.22 - 5.81 MIL/uL Final   Hemoglobin 02/17/2023 14.6  13.0 - 17.0 g/dL Final   HCT 40/34/7425 44.4  39.0 - 52.0 % Final   MCV 02/17/2023 95.9  80.0 - 100.0 fL Final   MCH 02/17/2023 31.5  26.0 - 34.0 pg Final   MCHC 02/17/2023 32.9  30.0 - 36.0 g/dL Final   RDW 95/63/8756 16.4 (H)  11.5 - 15.5 % Final   Platelets 02/17/2023 521 (H)  150 - 400 K/uL Final   nRBC 02/17/2023 0.0  0.0 - 0.2 % Final   Performed at [facility], [address], [place], [place] [address]  Admission on 11/17/2022, Discharged on 11/17/2022  Component Date Value Ref Range Status   Sodium 11/17/2022 141  135 - 145 mmol/L Final   Potassium 11/17/2022 3.5  3.5 - 5.1 mmol/L Final   Chloride 11/17/2022 107  98 - 111 mmol/L Final   CO2 11/17/2022  22  22 - 32 mmol/L Final   Glucose, Bld 11/17/2022 128 (H)  70 - 99 mg/dL Final   Glucose reference range applies only to samples taken after fasting for at least 8 hours.   BUN 11/17/2022 8  6 - 20 mg/dL Final   Creatinine, Ser 11/17/2022 0.78  0.61 - 1.24 mg/dL Final   Calcium 29/52/8413 9.1  8.9 - 10.3 mg/dL Final   Total Protein 24/40/1027 6.7  6.5 - 8.1 g/dL Final   Albumin  25/36/6440 3.4 (L)  3.5 - 5.0 g/dL Final   AST 34/74/2595 32  15 - 41 U/L Final   ALT 11/17/2022 25  0 - 44 U/L Final   Alkaline Phosphatase 11/17/2022 74  38 - 126 U/L Final   Total Bilirubin 11/17/2022 0.3  0.3 - 1.2 mg/dL Final   GFR, Estimated 11/17/2022 >60  >60 mL/min Final   Comment: (NOTE) Calculated using the CKD-EPI Creatinine Equation (2021)    Anion gap 11/17/2022 12  5 - 15 Final   Performed at [facility], [address] [place], [place] [address]   Alcohol, Ethyl (B) 11/17/2022 251 (H)  <10 mg/dL Final   Comment: (NOTE) Lowest detectable limit for serum alcohol is 10 mg/dL.  For medical purposes only. Performed at [facility], [address] [place], [place] [address]    WBC 11/17/2022 5.6  4.0 - 10.5 K/uL Final   RBC 11/17/2022 3.71 (L)  4.22 - 5.81 MIL/uL Final   Hemoglobin 11/17/2022 11.5 (L)  13.0 - 17.0 g/dL Final   HCT 95/18/8416 34.8 (L)  39.0 - 52.0 % Final   MCV 11/17/2022 93.8  80.0 - 100.0 fL Final   MCH 11/17/2022 31.0  26.0 - 34.0 pg Final   MCHC 11/17/2022 33.0  30.0 - 36.0 g/dL Final   RDW 60/63/0160 12.1  11.5 - 15.5 % Final   Platelets 11/17/2022 290  150 - 400 K/uL Final   nRBC 11/17/2022 0.0  0.0 - 0.2 % Final   Neutrophils Relative % 11/17/2022 41  % Final   Neutro Abs 11/17/2022 2.3  1.7 - 7.7 K/uL Final   Lymphocytes Relative 11/17/2022 37  % Final   Lymphs Abs 11/17/2022 2.1  0.7 - 4.0 K/uL Final   Monocytes Relative 11/17/2022 5  % Final   Monocytes Absolute 11/17/2022 0.3  0.1 - 1.0 K/uL Final   Eosinophils Relative 11/17/2022 15  % Final   Eosinophils Absolute 11/17/2022 0.8 (H)  0.0 - 0.5 K/uL Final   Basophils Relative 11/17/2022 2  % Final   Basophils Absolute 11/17/2022 0.1  0.0 - 0.1 K/uL Final   Immature Granulocytes 11/17/2022 0  % Final   Abs Immature Granulocytes 11/17/2022 0.01  0.00 - 0.07 K/uL Final   Performed at [facility], [address], [place], [place] [address]  Admission on 11/16/2022,  Discharged on 11/17/2022  Component Date Value Ref Range Status   WBC 11/16/2022 6.0  4.0 - 10.5 K/uL Final   RBC 11/16/2022 3.87 (L)  4.22 - 5.81 MIL/uL Final   Hemoglobin 11/16/2022 11.8 (L)  13.0 - 17.0 g/dL Final   HCT 35/57/3220 35.6 (L)  39.0 - 52.0 % Final   MCV 11/16/2022 92.0  80.0 - 100.0 fL Final   MCH 11/16/2022 30.5  26.0 - 34.0 pg Final   MCHC 11/16/2022 33.1  30.0 - 36.0 g/dL Final   RDW 25/42/7062 12.4  11.5 - 15.5 % Final   Platelets  11/16/2022 325  150 - 400 K/uL Final   nRBC 11/16/2022 0.0  0.0 - 0.2 % Final   Neutrophils Relative % 11/16/2022 43  % Final   Neutro Abs 11/16/2022 2.6  1.7 - 7.7 K/uL Final   Lymphocytes Relative 11/16/2022 37  % Final   Lymphs Abs 11/16/2022 2.2  0.7 - 4.0 K/uL Final   Monocytes Relative 11/16/2022 5  % Final   Monocytes Absolute 11/16/2022 0.3  0.1 - 1.0 K/uL Final   Eosinophils Relative 11/16/2022 13  % Final   Eosinophils Absolute 11/16/2022 0.8 (H)  0.0 - 0.5 K/uL Final   Basophils Relative 11/16/2022 2  % Final   Basophils Absolute 11/16/2022 0.1  0.0 - 0.1 K/uL Final   Immature Granulocytes 11/16/2022 0  % Final   Abs Immature Granulocytes 11/16/2022 0.01  0.00 - 0.07 K/uL Final   Performed at [facility], [address], [place], [place] [address]   Sodium 11/16/2022 141  135 - 145 mmol/L Final   Potassium 11/16/2022 4.1  3.5 - 5.1 mmol/L Final   Chloride 11/16/2022 107  98 - 111 mmol/L Final   CO2 11/16/2022 23  22 - 32 mmol/L Final   Glucose, Bld 11/16/2022 96  70 - 99 mg/dL Final   Glucose reference range applies only to samples taken after fasting for at least 8 hours.   BUN 11/16/2022 8  6 - 20 mg/dL Final   Creatinine, Ser 11/16/2022 0.80  0.61 - 1.24 mg/dL Final   Calcium 46/96/2952 9.5  8.9 - 10.3 mg/dL Final   Total Protein 84/13/2440 6.9  6.5 - 8.1 g/dL Final   Albumin 03/01/2535 3.8  3.5 - 5.0 g/dL Final   AST 64/40/3474 32  15 - 41 U/L Final   ALT 11/16/2022 27  0 - 44 U/L Final   Alkaline Phosphatase  11/16/2022 79  38 - 126 U/L Final   Total Bilirubin 11/16/2022 0.4  0.3 - 1.2 mg/dL Final   GFR, Estimated 11/16/2022 >60  >60 mL/min Final   Comment: (NOTE) Calculated using the CKD-EPI Creatinine Equation (2021)    Anion gap 11/16/2022 11  5 - 15 Final   Performed at [facility], [address], [place], [place] [address]   Alcohol, Ethyl (B) 11/16/2022 350 (HH)  <10 mg/dL Final   Comment: CRITICAL RESULT CALLED TO, READ BACK BY AND VERIFIED WITH Clearnce Hasten, RN. [phone] 11/16/22. LPAIT (NOTE) Lowest detectable limit for serum alcohol is 10 mg/dL.  For medical purposes only. Performed at [facility], [address], [place], [place] [address]    Cholesterol 11/16/2022 209 (H)  0 - 200 mg/dL Final   Triglycerides 95/18/8416 134  <150 mg/dL Final   HDL 60/63/0160 55  >40 mg/dL Final   Total CHOL/HDL Ratio 11/16/2022 3.8  RATIO Final   VLDL 11/16/2022 27  0 - 40 mg/dL Final   LDL Cholesterol 11/16/2022 127 (H)  0 - 99 mg/dL Final   Comment:        Total Cholesterol/HDL:CHD Risk Coronary Heart Disease Risk Table                     Men   Women  1/2 Average Risk   3.4   3.3  Average Risk       5.0   4.4  2 X Average Risk   9.6   7.1  3 X Average Risk  23.4   11.0  Use the calculated Patient Ratio above and the CHD Risk Table to determine the patient's CHD Risk.        ATP III CLASSIFICATION (LDL):  <100     mg/dL   Optimal  161-096  mg/dL   Near or Above                    Optimal  130-159  mg/dL   Borderline  045-409  mg/dL   High  >811     mg/dL   Very High Performed at [facility], [address] [place], [place] [address]    POC Amphetamine UR 11/16/2022 None Detected  NONE DETECTED (Cut Off Level 1000 ng/mL) Final   POC Secobarbital (BAR) 11/16/2022 None Detected  NONE DETECTED (Cut Off Level 300 ng/mL) Final   POC Buprenorphine (BUP) 11/16/2022 None Detected  NONE DETECTED (Cut Off Level 10 ng/mL) Final   POC Oxazepam (BZO) 11/16/2022 Positive  (A)  NONE DETECTED (Cut Off Level 300 ng/mL) Final   POC Cocaine UR 11/16/2022 None Detected  NONE DETECTED (Cut Off Level 300 ng/mL) Final   POC Methamphetamine UR 11/16/2022 None Detected  NONE DETECTED (Cut Off Level 1000 ng/mL) Final   POC Morphine 11/16/2022 None Detected  NONE DETECTED (Cut Off Level 300 ng/mL) Final   POC Methadone UR 11/16/2022 None Detected  NONE DETECTED (Cut Off Level 300 ng/mL) Final   POC Oxycodone UR 11/16/2022 None Detected  NONE DETECTED (Cut Off Level 100 ng/mL) Final   POC Marijuana UR 11/16/2022 None Detected  NONE DETECTED (Cut Off Level 50 ng/mL) Final    Allergies: Patient has no known allergies.  Medications:  Facility Ordered Medications  Medication   acetaminophen (TYLENOL) tablet 650 mg   alum & mag hydroxide-simeth (MAALOX/MYLANTA) 200-200-20 MG/5ML suspension 30 mL   magnesium hydroxide (MILK OF MAGNESIA) suspension 30 mL   traZODone (DESYREL) tablet 50 mg   [COMPLETED] thiamine (VITAMIN B1) injection 100 mg   gabapentin (NEURONTIN) capsule 200 mg   nicotine (NICODERM CQ - dosed in mg/24 hours) patch 21 mg   cloNIDine (CATAPRES) tablet 0.1 mg   [START ON 04/10/2023] thiamine (VITAMIN B1) tablet 100 mg   multivitamin with minerals tablet 1 tablet   LORazepam (ATIVAN) tablet 1 mg   hydrOXYzine (ATARAX) tablet 25 mg   loperamide (IMODIUM) capsule 2-4 mg   ondansetron (ZOFRAN-ODT) disintegrating tablet 4 mg   LORazepam (ATIVAN) tablet 1 mg   Followed by   Melene Muller ON 04/10/2023] LORazepam (ATIVAN) tablet 1 mg   Followed by   Melene Muller ON 04/11/2023] LORazepam (ATIVAN) tablet 1 mg   Followed by   Melene Muller ON 04/13/2023] LORazepam (ATIVAN) tablet 1 mg   PTA Medications  Medication Sig   hydrOXYzine (ATARAX) 25 MG tablet Take 1 tablet (25 mg total) by mouth every 6 (six) hours as needed for anxiety.   traZODone (DESYREL) 50 MG tablet Take 1 tablet (50 mg total) by mouth at bedtime as needed for sleep.    Screenings    Flowsheet Row Most Recent  Value  CIWA-Ar Total 7       Medical Decision Making  Patient meets criteria for facility based crisis, once medically cleared, patient can transfer to [facility].  1. Alcohol use disorder, severe, dependence (HCC) - Full code; Standing - Diet regular Room service appropriate? Yes; Fluid consistency: Thin; Standing - acetaminophen (TYLENOL) tablet 650 mg - alum & mag hydroxide-simeth (MAALOX/MYLANTA) 200-200-20 MG/5ML suspension 30 mL - magnesium hydroxide (MILK OF  MAGNESIA) suspension 30 mL - Place in continuous assessment [facility]; Standing - Voluntary Treatment; Standing - Activity as tolerated; Standing - CBC with Differential/Platelet; Standing - Comprehensive metabolic panel; Standing - Hemoglobin A1c; Standing - Ethanol; Standing - Lipid panel; Standing - POCT Urine Drug Screen - (I-Screen); Standing - EKG 12-Lead; Standing - Full code - Diet regular Room service appropriate? Yes; Fluid consistency: Thin - Place in continuous assessment [facility] - Voluntary Treatment - Activity as tolerated - CBC with Differential/Platelet - Comprehensive metabolic panel - Hemoglobin A1c - Ethanol - Lipid panel - POCT Urine Drug Screen - (I-Screen) - EKG 12-Lead - thiamine (VITAMIN B1) injection 100 mg - gabapentin (NEURONTIN) capsule 200 mg  2. Passive suicidal ideations - TSH; Standing - TSH  3. Tobacco use disorder - nicotine (NICODERM CQ - dosed in mg/24 hours) patch 21 mg      Recommendations  Based on my evaluation the patient does not appear to have an emergency medical condition. Once medically cleared, patient will transfer to [facility].   [name], NP 04/09/23  8:45 PM

## 2023-04-10 ENCOUNTER — Other Ambulatory Visit (HOSPITAL_COMMUNITY)
Admission: EM | Admit: 2023-04-10 | Discharge: 2023-04-14 | Disposition: A | Discharge status: Other Acute Inpatient Hospital | Payer: MEDICAID | Attending: Behavioral Health | Admitting: Behavioral Health

## 2023-04-10 DIAGNOSIS — F1090 Alcohol use, unspecified, uncomplicated: Secondary | ICD-10-CM | POA: Diagnosis present

## 2023-04-10 DIAGNOSIS — Z5902 Unsheltered homelessness: Secondary | ICD-10-CM | POA: Diagnosis not present

## 2023-04-10 DIAGNOSIS — F10239 Alcohol dependence with withdrawal, unspecified: Secondary | ICD-10-CM | POA: Insufficient documentation

## 2023-04-10 DIAGNOSIS — I10 Essential (primary) hypertension: Secondary | ICD-10-CM | POA: Insufficient documentation

## 2023-04-10 DIAGNOSIS — F1094 Alcohol use, unspecified with alcohol-induced mood disorder: Secondary | ICD-10-CM

## 2023-04-10 DIAGNOSIS — F32A Depression, unspecified: Secondary | ICD-10-CM | POA: Diagnosis not present

## 2023-04-10 DIAGNOSIS — R7401 Elevation of levels of liver transaminase levels: Secondary | ICD-10-CM | POA: Insufficient documentation

## 2023-04-10 DIAGNOSIS — F1024 Alcohol dependence with alcohol-induced mood disorder: Secondary | ICD-10-CM | POA: Insufficient documentation

## 2023-04-10 DIAGNOSIS — Z720 Tobacco use: Secondary | ICD-10-CM

## 2023-04-10 DIAGNOSIS — F1020 Alcohol dependence, uncomplicated: Secondary | ICD-10-CM | POA: Diagnosis not present

## 2023-04-10 DIAGNOSIS — L03115 Cellulitis of right lower limb: Secondary | ICD-10-CM

## 2023-04-10 DIAGNOSIS — Z758 Other problems related to medical facilities and other health care: Secondary | ICD-10-CM

## 2023-04-10 DIAGNOSIS — F17200 Nicotine dependence, unspecified, uncomplicated: Secondary | ICD-10-CM

## 2023-04-10 DIAGNOSIS — R45851 Suicidal ideations: Secondary | ICD-10-CM | POA: Diagnosis not present

## 2023-04-10 MED ORDER — LORAZEPAM 1 MG PO TABS
1.0000 mg | ORAL_TABLET | Freq: Four times a day (QID) | ORAL | Status: AC
  Administered 2023-04-10 (×2): 1 mg via ORAL
  Filled 2023-04-10 (×2): qty 1

## 2023-04-10 MED ORDER — LORAZEPAM 1 MG PO TABS: 1.0000 mg | ORAL_TABLET | Freq: Four times a day (QID) | ORAL | Status: DC | PRN

## 2023-04-10 MED ORDER — LORAZEPAM 1 MG PO TABS: 1.0000 mg | ORAL_TABLET | Freq: Every day | ORAL | Status: DC

## 2023-04-10 MED ORDER — ALUM & MAG HYDROXIDE-SIMETH 200-200-20 MG/5ML PO SUSP
30.0000 mL | ORAL | Status: DC | PRN
  Administered 2023-04-12: 30 mL via ORAL
  Filled 2023-04-10: qty 30

## 2023-04-10 MED ORDER — ACETAMINOPHEN 325 MG PO TABS: 650.0000 mg | ORAL_TABLET | Freq: Four times a day (QID) | ORAL | Status: DC | PRN

## 2023-04-10 MED ORDER — TRAZODONE HCL 50 MG PO TABS
50.0000 mg | ORAL_TABLET | Freq: Every evening | ORAL | Status: DC | PRN
  Administered 2023-04-10 – 2023-04-13 (×4): 50 mg via ORAL
  Filled 2023-04-10 (×2): qty 1
  Filled 2023-04-10: qty 14
  Filled 2023-04-10 (×2): qty 1

## 2023-04-10 MED ORDER — MAGNESIUM HYDROXIDE 400 MG/5ML PO SUSP
30.0000 mL | Freq: Every day | ORAL | Status: DC | PRN
  Administered 2023-04-13: 30 mL via ORAL
  Filled 2023-04-10: qty 30

## 2023-04-10 MED ORDER — NICOTINE 21 MG/24HR TD PT24
21.0000 mg | MEDICATED_PATCH | Freq: Every day | TRANSDERMAL | Status: DC
  Administered 2023-04-11 – 2023-04-14 (×2): 21 mg via TRANSDERMAL
  Filled 2023-04-10 (×4): qty 1
  Filled 2023-04-10: qty 14

## 2023-04-10 MED ORDER — LORAZEPAM 1 MG PO TABS
1.0000 mg | ORAL_TABLET | Freq: Three times a day (TID) | ORAL | Status: DC
  Administered 2023-04-10 – 2023-04-11 (×2): 1 mg via ORAL
  Filled 2023-04-10 (×2): qty 1

## 2023-04-10 MED ORDER — ADULT MULTIVITAMIN W/MINERALS CH
1.0000 | ORAL_TABLET | Freq: Every day | ORAL | Status: DC
  Administered 2023-04-11 – 2023-04-14 (×4): 1 via ORAL
  Filled 2023-04-10 (×4): qty 1

## 2023-04-10 MED ORDER — GABAPENTIN 100 MG PO CAPS
200.0000 mg | ORAL_CAPSULE | Freq: Two times a day (BID) | ORAL | Status: DC
  Administered 2023-04-10 – 2023-04-11 (×2): 200 mg via ORAL
  Filled 2023-04-10 (×3): qty 2

## 2023-04-10 MED ORDER — LORAZEPAM 1 MG PO TABS: 1.0000 mg | ORAL_TABLET | Freq: Two times a day (BID) | ORAL | Status: DC

## 2023-04-10 MED ORDER — CLONIDINE HCL 0.1 MG PO TABS
0.1000 mg | ORAL_TABLET | Freq: Two times a day (BID) | ORAL | Status: DC | PRN
  Administered 2023-04-11 – 2023-04-12 (×2): 0.1 mg via ORAL
  Filled 2023-04-10 (×2): qty 1

## 2023-04-10 MED ORDER — LOPERAMIDE HCL 2 MG PO CAPS: 2.0000 mg | ORAL_CAPSULE | ORAL | Status: AC | PRN

## 2023-04-10 MED ORDER — THIAMINE MONONITRATE 100 MG PO TABS
100.0000 mg | ORAL_TABLET | Freq: Every day | ORAL | Status: DC
  Administered 2023-04-11: 100 mg via ORAL
  Filled 2023-04-10: qty 1

## 2023-04-10 MED ORDER — HYDROXYZINE HCL 25 MG PO TABS
25.0000 mg | ORAL_TABLET | Freq: Four times a day (QID) | ORAL | Status: AC | PRN
  Administered 2023-04-11 – 2023-04-12 (×3): 25 mg via ORAL
  Filled 2023-04-10 (×3): qty 1

## 2023-04-10 MED ORDER — ONDANSETRON 4 MG PO TBDP
4.0000 mg | ORAL_TABLET | Freq: Four times a day (QID) | ORAL | Status: AC | PRN
  Administered 2023-04-12: 4 mg via ORAL
  Filled 2023-04-10: qty 1

## 2023-04-10 NOTE — ED Notes (Addendum)
Patient A&Ox4. Denies intent to harm self/others when asked. Denies A/VH. Patient denies any physical complaints when asked. No acute distress noted. Support and encouragement provided. Pt malodorous. Routine safety checks conducted according to facility protocol. Encouraged patient to notify staff if thoughts of harm toward self or others arise. Patient verbalize understanding and agreement. Currently in shower. Will continue to monitor for safety.

## 2023-04-10 NOTE — ED Provider Notes (Signed)
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 constipation -Hydroxyzine 25 mg q6h PRN for anxiety -Trazodone 50 mg qhs PRN for sleep  Tobacco Use Disorder -Nicotine patch ordered -Encourage smoking cessation   Dispo: wanting [facility]  Recommendations  Based on my evaluation the patient does not appear to have an emergency medical condition.  Lance Muss, MD 04/10/23  1:38 PM

## 2023-04-10 NOTE — ED Notes (Signed)
Pt sleeping@this time breathing even and unlabored will continue to monitor for safety 
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
## 2023-04-10 NOTE — ED Notes (Signed)
Pt came to the nursing station around 7:45pm requesting for medication to help with sleep. Writer informed him, sleep meds are given after 9pm so he can come back after 9pm or I can as well get him after 9pm. Pt is in his room. Pt denies SI/HI/AVH. No acute distress noted. Will continue to monitor for safety.

## 2023-04-10 NOTE — ED Notes (Signed)
Spirituality group facilitated by Wilkie Aye, MDiv, BCC.  Group Description: Group focused on topic of hope. Patients participated in facilitated discussion around topic, connecting with one another around experiences and definitions for hope. Group members engaged with visual explorer photos, reflecting on what hope looks like for them today. Group engaged in discussion around how their definitions of hope are present today in hospital.  Modalities: Psycho-social ed, Adlerian, Narrative, MI  Patient Progress: Gary Mclaughlin was present throughout group.  Actively engaged in group dialog.

## 2023-04-10 NOTE — ED Notes (Addendum)
Patient sitting in dayroom interacting with peers. No acute distress noted. No concerns voiced. Informed patient to notify staff with any needs or assistance. Patient verbalized understanding or agreement. Safety checks in place per facility policy.
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
## 2023-04-10 NOTE — ED Notes (Signed)
Pt transfer to [facility] in no acute distress. Report given to [place],  Charity fundraiser. Safety maintained.

## 2023-04-10 NOTE — ED Notes (Addendum)
Abnormal manual BP of 146/70 obtained. Provider Patrice NP made aware. Instruction to continue to monitor and alert if pt becomes symptomatic.

## 2023-04-10 NOTE — ED Notes (Addendum)
Patient transferred from [facility] to [facility] requesting assistance in detox from ETOH. Calm, cooperative throughout interview process. Skin assessment completed. Oriented to unit. Meal and drink offered. Patient reports anxiety 8/10, and mild itching on R shin where healing wound is scabbed over. Patient is malodorous and unkept in appearance. Initial suicide risk is high and provider Kizzie Ide, MD, made aware. Patient denies SI/HI and A/VH at this time, able to contract for safety. CIWA currently scoring 5 at this time. Environment secured, safety checks in place per facility policy.

## 2023-04-10 NOTE — Discharge Instructions (Signed)
Transfer to [facility]

## 2023-04-10 NOTE — ED Notes (Signed)
Pt sleeping in no acute distress. RR even and unlabored. Environment secured. Will continue to monitor for safety. 

## 2023-04-10 NOTE — ED Notes (Signed)
Pt was provided dinner.

## 2023-04-11 ENCOUNTER — Encounter (HOSPITAL_COMMUNITY): Payer: Self-pay | Admitting: Psychiatry

## 2023-04-11 DIAGNOSIS — F10239 Alcohol dependence with withdrawal, unspecified: Secondary | ICD-10-CM | POA: Diagnosis not present

## 2023-04-11 DIAGNOSIS — L03115 Cellulitis of right lower limb: Secondary | ICD-10-CM | POA: Diagnosis not present

## 2023-04-11 DIAGNOSIS — F1024 Alcohol dependence with alcohol-induced mood disorder: Secondary | ICD-10-CM | POA: Insufficient documentation

## 2023-04-11 DIAGNOSIS — R7401 Elevation of levels of liver transaminase levels: Secondary | ICD-10-CM | POA: Insufficient documentation

## 2023-04-11 DIAGNOSIS — F1094 Alcohol use, unspecified with alcohol-induced mood disorder: Secondary | ICD-10-CM

## 2023-04-11 DIAGNOSIS — Z5902 Unsheltered homelessness: Secondary | ICD-10-CM | POA: Diagnosis not present

## 2023-04-11 DIAGNOSIS — Z758 Other problems related to medical facilities and other health care: Secondary | ICD-10-CM

## 2023-04-11 DIAGNOSIS — I10 Essential (primary) hypertension: Secondary | ICD-10-CM | POA: Insufficient documentation

## 2023-04-11 MED ORDER — CHLORDIAZEPOXIDE HCL 25 MG PO CAPS: 25.0000 mg | ORAL_CAPSULE | Freq: Every day | ORAL | Status: DC

## 2023-04-11 MED ORDER — CHLORDIAZEPOXIDE HCL 25 MG PO CAPS
25.0000 mg | ORAL_CAPSULE | Freq: Four times a day (QID) | ORAL | Status: AC
  Administered 2023-04-11 (×3): 25 mg via ORAL
  Filled 2023-04-11 (×4): qty 1

## 2023-04-11 MED ORDER — CLINDAMYCIN HCL 150 MG PO CAPS
450.0000 mg | ORAL_CAPSULE | Freq: Three times a day (TID) | ORAL | Status: DC
  Administered 2023-04-11 – 2023-04-14 (×9): 450 mg via ORAL
  Filled 2023-04-11 (×8): qty 3
  Filled 2023-04-11: qty 36
  Filled 2023-04-11: qty 3

## 2023-04-11 MED ORDER — CHLORDIAZEPOXIDE HCL 25 MG PO CAPS
25.0000 mg | ORAL_CAPSULE | Freq: Four times a day (QID) | ORAL | Status: DC | PRN
Start: 2023-04-11 — End: 2023-04-14
  Administered 2023-04-12: 25 mg via ORAL
  Filled 2023-04-11: qty 1

## 2023-04-11 MED ORDER — NALTREXONE HCL 50 MG PO TABS: 50.0000 mg | ORAL_TABLET | Freq: Every day | ORAL | Status: DC

## 2023-04-11 MED ORDER — NALTREXONE HCL 50 MG PO TABS
50.0000 mg | ORAL_TABLET | Freq: Every day | ORAL | Status: DC
  Administered 2023-04-11 – 2023-04-13 (×3): 50 mg via ORAL
  Filled 2023-04-11: qty 14
  Filled 2023-04-11 (×3): qty 1

## 2023-04-11 MED ORDER — THIAMINE MONONITRATE 100 MG PO TABS
100.0000 mg | ORAL_TABLET | Freq: Every day | ORAL | Status: DC
  Administered 2023-04-12 – 2023-04-14 (×3): 100 mg via ORAL
  Filled 2023-04-11 (×3): qty 1

## 2023-04-11 MED ORDER — CHLORDIAZEPOXIDE HCL 25 MG PO CAPS
25.0000 mg | ORAL_CAPSULE | Freq: Three times a day (TID) | ORAL | Status: AC
Start: 2023-04-12 — End: 2023-04-13
  Administered 2023-04-12 – 2023-04-13 (×3): 25 mg via ORAL
  Filled 2023-04-11 (×3): qty 1

## 2023-04-11 MED ORDER — CHLORDIAZEPOXIDE HCL 25 MG PO CAPS
25.0000 mg | ORAL_CAPSULE | ORAL | Status: AC
  Administered 2023-04-13 – 2023-04-14 (×2): 25 mg via ORAL
  Filled 2023-04-11 (×2): qty 1

## 2023-04-11 MED ORDER — CHLORDIAZEPOXIDE HCL 25 MG PO CAPS: 25.0000 mg | ORAL_CAPSULE | ORAL | Status: DC

## 2023-04-11 MED ORDER — GABAPENTIN 100 MG PO CAPS
200.0000 mg | ORAL_CAPSULE | Freq: Two times a day (BID) | ORAL | Status: DC
  Administered 2023-04-11 – 2023-04-12 (×2): 200 mg via ORAL
  Filled 2023-04-11 (×2): qty 2

## 2023-04-11 MED ORDER — CHLORDIAZEPOXIDE HCL 25 MG PO CAPS: 25.0000 mg | ORAL_CAPSULE | Freq: Four times a day (QID) | ORAL | Status: DC

## 2023-04-11 MED ORDER — CHLORDIAZEPOXIDE HCL 25 MG PO CAPS: 25.0000 mg | ORAL_CAPSULE | Freq: Three times a day (TID) | ORAL | Status: DC

## 2023-04-11 NOTE — ED Notes (Signed)
Patient has been calm and cooperative with treatment.  No complaints or distress.  Patient b/p elevated at 160/100 manually.  Provider Bonita Quin made aware.

## 2023-04-11 NOTE — ED Notes (Signed)
Patient is sleeping. Respirations equal and unlabored, skin warm and dry. No change in assessment or acuity. Routine safety checks conducted according to facility protocol. Will continue to monitor for safety.   

## 2023-04-11 NOTE — ED Notes (Signed)
Patient asleep in bed without issue or complaint.  No evidence of withdrawal at this time.  Will monitor.
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
## 2023-04-11 NOTE — ED Notes (Signed)
Pt A&O x 4, sitting in dayroom watching TV, no distress noted. Calm & cooperative at present.  Monitoring for safety.
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
## 2023-04-11 NOTE — ED Provider Notes (Signed)
Behavioral Health Progress Note  Date and Time: 04/11/2023 3:21 PM Name: Gary Mclaughlin MRN:  [id number]  Subjective:  Gary Mclaughlin is a 52 y.o. male, with PMH of AUD (sz, no DT), tobacco use d/o, SIMD, housing instability, remote suicide attempt and inpatient psych admission, who presented to [facility] (04/10/2023) then admitted to [facility] the same day for EtOH detox and treatment    Sleep: Good Appetite:  Good - much improved  Continues to experience w/d sxs - sweats, anxiety and panic, tremors.  Mood continues to improve when he is not drinking. He is depressed and anxious only when drinking. When he stops, he has no anxiety or depression about 83mo after cessation. Denied mood concerns prior to regular drinking. He noticed having worsening suicidal thoughts when he is drinking as well. Denied active and passive SI currently.   Reported that he didn't finish his antibiotics for his leg, which looks much better to him. This is what prevented him to go to [facility] last time.   Discussed his resulting labs and elevated BP. He has not gone to the PCP, so does not know if he has any medical problems  ROS    Diagnosis:  Final diagnoses:  Alcohol use disorder, severe, dependence (HCC)  Tobacco use disorder    Past Psychiatric History:  Dx: AUD (sz, no DT), tobacco use d/o, SIMD Suicide attempt: yes remote - in the setting of etoh use - 52yo Hospitalization: yes remote - 52yo  Past Medical History:  Does not see PCP EtOH w/d sz x1  Family History:  Father and grandfather, maternal uncles had alcohol problems.   Family Psychiatric  History: NA  Social History:  Unhoused Unemployed No support  Total Time spent with patient: 30 minutes  Additional Social History:                         Current Medications:  Current Facility-Administered Medications  Medication Dose Route Frequency Provider Last Rate Last Admin   acetaminophen (TYLENOL) tablet 650 mg  650 mg Oral Q6H PRN  Lance Muss, MD       alum & mag hydroxide-simeth (MAALOX/MYLANTA) 200-200-20 MG/5ML suspension 30 mL  30 mL Oral Q4H PRN White, Patrice L, NP       chlordiazePOXIDE (LIBRIUM) capsule 25 mg  25 mg Oral Q6H PRN Princess Bruins, DO       chlordiazePOXIDE (LIBRIUM) capsule 25 mg  25 mg Oral QID Princess Bruins, DO   25 mg at 04/11/23 1405   Followed by   Melene Muller ON 04/12/2023] chlordiazePOXIDE (LIBRIUM) capsule 25 mg  25 mg Oral TID Princess Bruins, DO       Followed by   Melene Muller ON 04/13/2023] chlordiazePOXIDE (LIBRIUM) capsule 25 mg  25 mg Oral Doreatha Martin, DO       Followed by   Melene Muller ON 04/14/2023] chlordiazePOXIDE (LIBRIUM) capsule 25 mg  25 mg Oral Daily Princess Bruins, DO       clindamycin (CLEOCIN) capsule 450 mg  450 mg Oral Q8H Princess Bruins, DO   450 mg at 04/11/23 1405   cloNIDine (CATAPRES) tablet 0.1 mg  0.1 mg Oral BID PRN White, Patrice L, NP       gabapentin (NEURONTIN) capsule 200 mg  200 mg Oral BID Princess Bruins, DO       hydrOXYzine (ATARAX) tablet 25 mg  25 mg Oral Q6H PRN White, Patrice L, NP   25 mg at 04/11/23 1405  loperamide (IMODIUM) capsule 2-4 mg  2-4 mg Oral PRN White, Patrice L, NP       magnesium hydroxide (MILK OF MAGNESIA) suspension 30 mL  30 mL Oral Daily PRN White, Patrice L, NP       multivitamin with minerals tablet 1 tablet  1 tablet Oral Daily White, Patrice L, NP   1 tablet at 04/11/23 1191   naltrexone (DEPADE) tablet 50 mg  50 mg Oral QHS Princess Bruins, DO       nicotine (NICODERM CQ - dosed in mg/24 hours) patch 21 mg  21 mg Transdermal Daily White, Patrice L, NP   21 mg at 04/11/23 0923   ondansetron (ZOFRAN-ODT) disintegrating tablet 4 mg  4 mg Oral Q6H PRN Layla Barter, NP       [START ON 04/12/2023] thiamine (VITAMIN B1) tablet 100 mg  100 mg Oral Daily Princess Bruins, DO       traZODone (DESYREL) tablet 50 mg  50 mg Oral QHS PRN White, Patrice L, NP   50 mg at 04/10/23 2105   No current outpatient medications on file.    Labs  Lab  Results:  Admission on 04/09/2023, Discharged on 04/10/2023  Component Date Value Ref Range Status   WBC 04/09/2023 4.4  4.0 - 10.5 K/uL Final   RBC 04/09/2023 3.84 (L)  4.22 - 5.81 MIL/uL Final   Hemoglobin 04/09/2023 12.6 (L)  13.0 - 17.0 g/dL Final   HCT 47/82/9562 36.6 (L)  39.0 - 52.0 % Final   MCV 04/09/2023 95.3  80.0 - 100.0 fL Final   MCH 04/09/2023 32.8  26.0 - 34.0 pg Final   MCHC 04/09/2023 34.4  30.0 - 36.0 g/dL Final   RDW 13/12/6576 16.7 (H)  11.5 - 15.5 % Final   Platelets 04/09/2023 319  150 - 400 K/uL Final   nRBC 04/09/2023 0.0  0.0 - 0.2 % Final   Neutrophils Relative % 04/09/2023 64  % Final   Neutro Abs 04/09/2023 2.8  1.7 - 7.7 K/uL Final   Lymphocytes Relative 04/09/2023 22  % Final   Lymphs Abs 04/09/2023 1.0  0.7 - 4.0 K/uL Final   Monocytes Relative 04/09/2023 5  % Final   Monocytes Absolute 04/09/2023 0.2  0.1 - 1.0 K/uL Final   Eosinophils Relative 04/09/2023 6  % Final   Eosinophils Absolute 04/09/2023 0.3  0.0 - 0.5 K/uL Final   Basophils Relative 04/09/2023 2  % Final   Basophils Absolute 04/09/2023 0.1  0.0 - 0.1 K/uL Final   Immature Granulocytes 04/09/2023 1  % Final   Abs Immature Granulocytes 04/09/2023 0.04  0.00 - 0.07 K/uL Final   Performed at [facility], [address], [place], [place] [address]   Sodium 04/09/2023 136  135 - 145 mmol/L Final   Potassium 04/09/2023 4.4  3.5 - 5.1 mmol/L Final   Chloride 04/09/2023 99  98 - 111 mmol/L Final   CO2 04/09/2023 25  22 - 32 mmol/L Final   Glucose, Bld 04/09/2023 101 (H)  70 - 99 mg/dL Final   Glucose reference range applies only to samples taken after fasting for at least 8 hours.   BUN 04/09/2023 9  6 - 20 mg/dL Final   Creatinine, Ser 04/09/2023 0.70  0.61 - 1.24 mg/dL Final   Calcium 95/28/4132 10.1  8.9 - 10.3 mg/dL Final   Total Protein 44/05/270 7.6  6.5 - 8.1 g/dL Final   Albumin 53/66/4403 3.8  3.5 - 5.0 g/dL  Final   AST 04/09/2023 62 (H)  15 - 41 U/L Final   ALT 04/09/2023 32   0 - 44 U/L Final   Alkaline Phosphatase 04/09/2023 90  38 - 126 U/L Final   Total Bilirubin 04/09/2023 0.4  <1.2 mg/dL Final   GFR, Estimated 04/09/2023 >60  >60 mL/min Final   Comment: (NOTE) Calculated using the CKD-EPI Creatinine Equation (2021)    Anion gap 04/09/2023 12  5 - 15 Final   Performed at [facility], [address] [place], [place] [address]   Hgb A1c MFr Bld 04/09/2023 5.4  4.8 - 5.6 % Final   Comment: (NOTE) Pre diabetes:          5.7%-6.4%  Diabetes:              >6.4%  Glycemic control for   <7.0% adults with diabetes    Mean Plasma Glucose 04/09/2023 108.28  mg/dL Final   Performed at [facility], [address] [place], [place] [address]   Alcohol, Ethyl (B) 04/09/2023 349 (HH)  <10 mg/dL Final   Comment: CRITICAL RESULT CALLED TO, READ BACK BY AND VERIFIED WITH Carolynn Comment RN 04/09/2023 2003 BNUNNERY MT (NOTE) Lowest detectable limit for serum alcohol is 10 mg/dL.  For medical purposes only. Performed at [facility], [address] [place], [place] [address]    Cholesterol 04/09/2023 235 (H)  0 - 200 mg/dL Final   Triglycerides 25/95/6387 116  <150 mg/dL Final   HDL 56/43/3295 119  >40 mg/dL Final   Total CHOL/HDL Ratio 04/09/2023 2.0  RATIO Final   VLDL 04/09/2023 23  0 - 40 mg/dL Final   LDL Cholesterol 04/09/2023 93  0 - 99 mg/dL Final   Comment:        Total Cholesterol/HDL:CHD Risk Coronary Heart Disease Risk Table                     Men   Women  1/2 Average Risk   3.4   3.3  Average Risk       5.0   4.4  2 X Average Risk   9.6   7.1  3 X Average Risk  23.4   11.0        Use the calculated Patient Ratio above and the CHD Risk Table to determine the patient's CHD Risk.        ATP III CLASSIFICATION (LDL):  <100     mg/dL   Optimal  188-416  mg/dL   Near or Above                    Optimal  130-159  mg/dL   Borderline  606-301  mg/dL   High  >601     mg/dL   Very High Performed at [facility], [address] [place], [place] [address]    TSH 04/09/2023 1.422  0.350 - 4.500 uIU/mL Final   Comment: Performed by a 3rd Generation assay with a functional sensitivity of <=0.01 uIU/mL. Performed at [facility], [address], [place], [place] [address]    POC Amphetamine UR 04/09/2023 None Detected  NONE DETECTED (Cut Off Level 1000 ng/mL) Final   POC Secobarbital (BAR) 04/09/2023 None Detected  NONE DETECTED (Cut Off Level 300 ng/mL) Final   POC Buprenorphine (BUP) 04/09/2023 None Detected  NONE DETECTED (Cut Off Level 10 ng/mL) Final   POC Oxazepam (BZO) 04/09/2023 None  Detected  NONE DETECTED (Cut Off Level 300 ng/mL) Final   POC Cocaine UR 04/09/2023 None Detected  NONE DETECTED (Cut Off Level 300 ng/mL) Final   POC Methamphetamine UR 04/09/2023 None Detected  NONE DETECTED (Cut Off Level 1000 ng/mL) Final   POC Morphine 04/09/2023 None Detected  NONE DETECTED (Cut Off Level 300 ng/mL) Final   POC Methadone UR 04/09/2023 None Detected  NONE DETECTED (Cut Off Level 300 ng/mL) Final   POC Oxycodone UR 04/09/2023 None Detected  NONE DETECTED (Cut Off Level 100 ng/mL) Final   POC Marijuana UR 04/09/2023 None Detected  NONE DETECTED (Cut Off Level 50 ng/mL) Final  Admission on 03/15/2023, Discharged on 03/15/2023  Component Date Value Ref Range Status   WBC 03/15/2023 6.8  4.0 - 10.5 K/uL Final   RBC 03/15/2023 3.92 (L)  4.22 - 5.81 MIL/uL Final   Hemoglobin 03/15/2023 12.6 (L)  13.0 - 17.0 g/dL Final   HCT 02/72/5366 36.6 (L)  39.0 - 52.0 % Final   MCV 03/15/2023 93.4  80.0 - 100.0 fL Final   MCH 03/15/2023 32.1  26.0 - 34.0 pg Final   MCHC 03/15/2023 34.4  30.0 - 36.0 g/dL Final   RDW 44/07/4740 17.1 (H)  11.5 - 15.5 % Final   Platelets 03/15/2023 271  150 - 400 K/uL Final   nRBC 03/15/2023 0.0  0.0 - 0.2 % Final   Neutrophils Relative % 03/15/2023 71  % Final   Neutro Abs 03/15/2023 4.9  1.7 - 7.7 K/uL Final   Lymphocytes Relative 03/15/2023 19  % Final   Lymphs Abs 03/15/2023 1.3   0.7 - 4.0 K/uL Final   Monocytes Relative 03/15/2023 8  % Final   Monocytes Absolute 03/15/2023 0.5  0.1 - 1.0 K/uL Final   Eosinophils Relative 03/15/2023 1  % Final   Eosinophils Absolute 03/15/2023 0.1  0.0 - 0.5 K/uL Final   Basophils Relative 03/15/2023 1  % Final   Basophils Absolute 03/15/2023 0.1  0.0 - 0.1 K/uL Final   Immature Granulocytes 03/15/2023 0  % Final   Abs Immature Granulocytes 03/15/2023 0.02  0.00 - 0.07 K/uL Final   Performed at [facility], [address] [place], [place] [address]   Alcohol, Ethyl (B) 03/15/2023 366 (HH)  <10 mg/dL Final   Comment: CRITICAL RESULT CALLED TO, READ BACK BY AND VERIFIED WITH HOPKINS,D. RN AT 1556 03/15/23 MULLINS,T (NOTE) Lowest detectable limit for serum alcohol is 10 mg/dL.  For medical purposes only. Performed at [facility], [address] [place], [place] [address]    Sodium 03/15/2023 129 (L)  135 - 145 mmol/L Final   Potassium 03/15/2023 4.0  3.5 - 5.1 mmol/L Final   Chloride 03/15/2023 92 (L)  98 - 111 mmol/L Final   CO2 03/15/2023 27  22 - 32 mmol/L Final   Glucose, Bld 03/15/2023 99  70 - 99 mg/dL Final   Glucose reference range applies only to samples taken after fasting for at least 8 hours.   BUN 03/15/2023 5 (L)  6 - 20 mg/dL Final   Creatinine, Ser 03/15/2023 0.55 (L)  0.61 - 1.24 mg/dL Final   Calcium 33/29/5188 9.1  8.9 - 10.3 mg/dL Final   GFR, Estimated 03/15/2023 >60  >60 mL/min Final   Comment: (NOTE) Calculated using the CKD-EPI Creatinine Equation (2021)    Anion gap 03/15/2023 10  5 - 15 Final   Performed at [facility], [address] [place],  [place] [address]  Admission on 02/17/2023, Discharged on 02/17/2023  Component Date Value Ref Range Status   Sodium 02/17/2023 141  135 - 145 mmol/L Final   Potassium 02/17/2023 4.3  3.5 - 5.1 mmol/L Final   Chloride 02/17/2023 106  98 - 111 mmol/L Final   CO2 02/17/2023 23  22 - 32 mmol/L  Final   Glucose, Bld 02/17/2023 87  70 - 99 mg/dL Final   Glucose reference range applies only to samples taken after fasting for at least 8 hours.   BUN 02/17/2023 12  6 - 20 mg/dL Final   Creatinine, Ser 02/17/2023 0.85  0.61 - 1.24 mg/dL Final   Calcium 86/57/8469 9.7  8.9 - 10.3 mg/dL Final   Total Protein 62/95/2841 8.0  6.5 - 8.1 g/dL Final   Albumin 32/44/0102 4.4  3.5 - 5.0 g/dL Final   AST 72/53/6644 43 (H)  15 - 41 U/L Final   ALT 02/17/2023 36  0 - 44 U/L Final   Alkaline Phosphatase 02/17/2023 78  38 - 126 U/L Final   Total Bilirubin 02/17/2023 0.4  0.3 - 1.2 mg/dL Final   GFR, Estimated 02/17/2023 >60  >60 mL/min Final   Comment: (NOTE) Calculated using the CKD-EPI Creatinine Equation (2021)    Anion gap 02/17/2023 12  5 - 15 Final   Performed at [facility], [address] [place], [place] [address]   Alcohol, Ethyl (B) 02/17/2023 381 (HH)  <10 mg/dL Final   Comment: CRITICAL RESULT CALLED TO, READ BACK BY AND VERIFIED WITH MERRICKS, L. RN @ 0100 02/17/23 JBUTLER (NOTE) Lowest detectable limit for serum alcohol is 10 mg/dL.  For medical purposes only. Performed at [facility], [address], [place], [place] [address]    WBC 02/17/2023 7.1  4.0 - 10.5 K/uL Final   RBC 02/17/2023 4.63  4.22 - 5.81 MIL/uL Final   Hemoglobin 02/17/2023 14.6  13.0 - 17.0 g/dL Final   HCT 38/75/6433 44.4  39.0 - 52.0 % Final   MCV 02/17/2023 95.9  80.0 - 100.0 fL Final   MCH 02/17/2023 31.5  26.0 - 34.0 pg Final   MCHC 02/17/2023 32.9  30.0 - 36.0 g/dL Final   RDW 29/51/8841 16.4 (H)  11.5 - 15.5 % Final   Platelets 02/17/2023 521 (H)  150 - 400 K/uL Final   nRBC 02/17/2023 0.0  0.0 - 0.2 % Final   Performed at [facility], [address], [place], [place] [address]  Admission on 11/17/2022, Discharged on 11/17/2022  Component Date Value Ref Range Status   Sodium 11/17/2022 141  135 - 145 mmol/L Final   Potassium 11/17/2022 3.5  3.5 - 5.1 mmol/L Final    Chloride 11/17/2022 107  98 - 111 mmol/L Final   CO2 11/17/2022 22  22 - 32 mmol/L Final   Glucose, Bld 11/17/2022 128 (H)  70 - 99 mg/dL Final   Glucose reference range applies only to samples taken after fasting for at least 8 hours.   BUN 11/17/2022 8  6 - 20 mg/dL Final   Creatinine, Ser 11/17/2022 0.78  0.61 - 1.24 mg/dL Final   Calcium 01/60/1093 9.1  8.9 - 10.3 mg/dL Final   Total Protein 23/55/7322 6.7  6.5 - 8.1 g/dL Final   Albumin 02/54/2706 3.4 (L)  3.5 - 5.0 g/dL Final   AST 23/76/2831 32  15 - 41 U/L Final   ALT 11/17/2022 25  0 - 44 U/L Final  Alkaline Phosphatase 11/17/2022 74  38 - 126 U/L Final   Total Bilirubin 11/17/2022 0.3  0.3 - 1.2 mg/dL Final   GFR, Estimated 11/17/2022 >60  >60 mL/min Final   Comment: (NOTE) Calculated using the CKD-EPI Creatinine Equation (2021)    Anion gap 11/17/2022 12  5 - 15 Final   Performed at [facility], [address], [place], [place] [address]   Alcohol, Ethyl (B) 11/17/2022 251 (H)  <10 mg/dL Final   Comment: (NOTE) Lowest detectable limit for serum alcohol is 10 mg/dL.  For medical purposes only. Performed at [facility], [address], [place], [place] [address]    WBC 11/17/2022 5.6  4.0 - 10.5 K/uL Final   RBC 11/17/2022 3.71 (L)  4.22 - 5.81 MIL/uL Final   Hemoglobin 11/17/2022 11.5 (L)  13.0 - 17.0 g/dL Final   HCT 29/52/8413 34.8 (L)  39.0 - 52.0 % Final   MCV 11/17/2022 93.8  80.0 - 100.0 fL Final   MCH 11/17/2022 31.0  26.0 - 34.0 pg Final   MCHC 11/17/2022 33.0  30.0 - 36.0 g/dL Final   RDW 24/40/1027 12.1  11.5 - 15.5 % Final   Platelets 11/17/2022 290  150 - 400 K/uL Final   nRBC 11/17/2022 0.0  0.0 - 0.2 % Final   Neutrophils Relative % 11/17/2022 41  % Final   Neutro Abs 11/17/2022 2.3  1.7 - 7.7 K/uL Final   Lymphocytes Relative 11/17/2022 37  % Final   Lymphs Abs 11/17/2022 2.1  0.7 - 4.0 K/uL Final   Monocytes Relative 11/17/2022 5  % Final   Monocytes Absolute 11/17/2022 0.3  0.1 - 1.0  K/uL Final   Eosinophils Relative 11/17/2022 15  % Final   Eosinophils Absolute 11/17/2022 0.8 (H)  0.0 - 0.5 K/uL Final   Basophils Relative 11/17/2022 2  % Final   Basophils Absolute 11/17/2022 0.1  0.0 - 0.1 K/uL Final   Immature Granulocytes 11/17/2022 0  % Final   Abs Immature Granulocytes 11/17/2022 0.01  0.00 - 0.07 K/uL Final   Performed at [facility], [address], [place], [place] [address]  Admission on 11/16/2022, Discharged on 11/17/2022  Component Date Value Ref Range Status   WBC 11/16/2022 6.0  4.0 - 10.5 K/uL Final   RBC 11/16/2022 3.87 (L)  4.22 - 5.81 MIL/uL Final   Hemoglobin 11/16/2022 11.8 (L)  13.0 - 17.0 g/dL Final   HCT 44/07/4740 35.6 (L)  39.0 - 52.0 % Final   MCV 11/16/2022 92.0  80.0 - 100.0 fL Final   MCH 11/16/2022 30.5  26.0 - 34.0 pg Final   MCHC 11/16/2022 33.1  30.0 - 36.0 g/dL Final   RDW 59/56/3875 12.4  11.5 - 15.5 % Final   Platelets 11/16/2022 325  150 - 400 K/uL Final   nRBC 11/16/2022 0.0  0.0 - 0.2 % Final   Neutrophils Relative % 11/16/2022 43  % Final   Neutro Abs 11/16/2022 2.6  1.7 - 7.7 K/uL Final   Lymphocytes Relative 11/16/2022 37  % Final   Lymphs Abs 11/16/2022 2.2  0.7 - 4.0 K/uL Final   Monocytes Relative 11/16/2022 5  % Final   Monocytes Absolute 11/16/2022 0.3  0.1 - 1.0 K/uL Final   Eosinophils Relative 11/16/2022 13  % Final   Eosinophils Absolute 11/16/2022 0.8 (H)  0.0 - 0.5 K/uL Final   Basophils Relative 11/16/2022 2  % Final   Basophils Absolute 11/16/2022 0.1  0.0 - 0.1 K/uL Final   Immature Granulocytes 11/16/2022 0  % Final   Abs Immature Granulocytes 11/16/2022 0.01  0.00 - 0.07 K/uL Final   Performed at [facility], [address] [place], [place] [address]   Sodium 11/16/2022 141  135 - 145 mmol/L Final   Potassium 11/16/2022 4.1  3.5 - 5.1 mmol/L Final   Chloride 11/16/2022 107  98 - 111 mmol/L Final   CO2 11/16/2022 23  22 - 32 mmol/L Final   Glucose, Bld 11/16/2022 96  70 - 99 mg/dL Final    Glucose reference range applies only to samples taken after fasting for at least 8 hours.   BUN 11/16/2022 8  6 - 20 mg/dL Final   Creatinine, Ser 11/16/2022 0.80  0.61 - 1.24 mg/dL Final   Calcium 14/78/2956 9.5  8.9 - 10.3 mg/dL Final   Total Protein 21/30/8657 6.9  6.5 - 8.1 g/dL Final   Albumin 84/69/6295 3.8  3.5 - 5.0 g/dL Final   AST 28/41/3244 32  15 - 41 U/L Final   ALT 11/16/2022 27  0 - 44 U/L Final   Alkaline Phosphatase 11/16/2022 79  38 - 126 U/L Final   Total Bilirubin 11/16/2022 0.4  0.3 - 1.2 mg/dL Final   GFR, Estimated 11/16/2022 >60  >60 mL/min Final   Comment: (NOTE) Calculated using the CKD-EPI Creatinine Equation (2021)    Anion gap 11/16/2022 11  5 - 15 Final   Performed at [facility], [address], [place], [place] [address]   Alcohol, Ethyl (B) 11/16/2022 350 (HH)  <10 mg/dL Final   Comment: CRITICAL RESULT CALLED TO, READ BACK BY AND VERIFIED WITH Clearnce Hasten, RN. [phone] 11/16/22. LPAIT (NOTE) Lowest detectable limit for serum alcohol is 10 mg/dL.  For medical purposes only. Performed at [facility], [address], [place], [place] [address]    Cholesterol 11/16/2022 209 (H)  0 - 200 mg/dL Final   Triglycerides 47/42/5956 134  <150 mg/dL Final   HDL 38/75/6433 55  >40 mg/dL Final   Total CHOL/HDL Ratio 11/16/2022 3.8  RATIO Final   VLDL 11/16/2022 27  0 - 40 mg/dL Final   LDL Cholesterol 11/16/2022 127 (H)  0 - 99 mg/dL Final   Comment:        Total Cholesterol/HDL:CHD Risk Coronary Heart Disease Risk Table                     Men   Women  1/2 Average Risk   3.4   3.3  Average Risk       5.0   4.4  2 X Average Risk   9.6   7.1  3 X Average Risk  23.4   11.0        Use the calculated Patient Ratio above and the CHD Risk Table to determine the patient's CHD Risk.        ATP III CLASSIFICATION (LDL):  <100     mg/dL   Optimal  295-188  mg/dL   Near or Above                    Optimal  130-159  mg/dL   Borderline  416-606  mg/dL    High  >301     mg/dL   Very High Performed at [facility], [address], [place], [place] [address]    POC Amphetamine UR 11/16/2022 None Detected  NONE DETECTED (Cut Off Level 1000 ng/mL) Final   POC Secobarbital (BAR) 11/16/2022 None Detected  NONE DETECTED (Cut Off Level 300 ng/mL) Final   POC Buprenorphine (BUP) 11/16/2022 None Detected  NONE DETECTED (Cut Off Level 10 ng/mL) Final   POC Oxazepam (BZO) 11/16/2022 Positive (A)  NONE DETECTED (Cut Off Level 300 ng/mL) Final   POC Cocaine UR 11/16/2022 None Detected  NONE DETECTED (Cut Off Level 300 ng/mL) Final   POC Methamphetamine UR 11/16/2022 None Detected  NONE DETECTED (Cut Off Level 1000 ng/mL) Final   POC Morphine 11/16/2022 None Detected  NONE DETECTED (Cut Off Level 300 ng/mL) Final   POC Methadone UR 11/16/2022 None Detected  NONE DETECTED (Cut Off Level 300 ng/mL) Final   POC Oxycodone UR 11/16/2022 None Detected  NONE DETECTED (Cut Off Level 100 ng/mL) Final   POC Marijuana UR 11/16/2022 None Detected  NONE DETECTED (Cut Off Level 50 ng/mL) Final    Blood Alcohol level:  Lab Results  Component Value Date   ETH 349 (HH) 04/09/2023   ETH 366 (HH) 03/15/2023    Metabolic Disorder Labs: Lab Results  Component Value Date   HGBA1C 5.4 04/09/2023   MPG 108.28 04/09/2023   MPG 116.89 05/02/2021   No results found for: "PROLACTIN" Lab Results  Component Value Date   CHOL 235 (H) 04/09/2023   TRIG 116 04/09/2023   HDL 119 04/09/2023   CHOLHDL 2.0 04/09/2023   VLDL 23 04/09/2023   LDLCALC 93 04/09/2023   LDLCALC 127 (H) 11/16/2022    Therapeutic Lab Levels: No results found for: "LITHIUM" No results found for: "VALPROATE" No results found for: "CBMZ"  Physical Findings   AUDIT    Flowsheet Row ED from 04/10/2023 in [facility] Admission (Discharged) from 05/01/2014 in [facility]  Alcohol Use Disorder Identification Test Final Score (AUDIT) 34 32       CAGE-AID    Flowsheet Row ED to Hosp-Admission (Discharged) from 04/16/2022 in [place] [facility]  CAGE-AID Score 3      PHQ2-9    Flowsheet Row ED from 04/10/2023 in [facility] ED from 04/09/2023 in [facility] ED from 11/17/2022 in [facility] ED from 05/02/2021 in [facility]  PHQ-2 Total Score 2 2 0 2  PHQ-9 Total Score 7 7 1 4       Flowsheet Row ED from 04/10/2023 in [facility] ED from 04/09/2023 in [facility] ED from 03/15/2023 in [facility] Emergency Department at [facility]  C-SSRS RISK CATEGORY High Risk High Risk No Risk        Musculoskeletal  Strength & Muscle Tone: within normal limited Gait & Station: normal  Patient leans: NA   Psychiatric Specialty Exam  Presentation  General Appearance:  Disheveled; Other (comment) (poor hygiene/body odor)   Eye Contact: Fair   Speech: Clear and Coherent   Speech Volume: Normal   Handedness: Right    Mood and Affect  Mood: Depressed   Affect: Flat    Thought Process  Thought Processes: Coherent; Goal Directed   Descriptions of Associations:Intact   Orientation:Full (Time, Place and Person)   Thought Content:WDL; Logical   Diagnosis of Schizophrenia or Schizoaffective disorder in past: No   Duration of Psychotic Symptoms: NA   Hallucinations:Hallucinations: None   Ideas of Reference:None   Suicidal Thoughts:Suicidal Thoughts: No   Homicidal  Thoughts:Homicidal Thoughts: No    Sensorium  Memory: Immediate Good; Recent Good; Remote Good  Judgment: Fair  Insight: Fair   Chartered certified accountant: Fair  Attention Span: Fair  Recall: [employer] of Knowledge: Fair  Language: Fair   Psychomotor Activity  Psychomotor Activity: Psychomotor Activity:  Normal   Assets  Assets: Manufacturing systems engineer; Desire for Improvement   Sleep  Sleep: Sleep: Good Number of Hours of Sleep: 9   Physical Exam  Physical Exam Vitals and nursing note reviewed.  HENT:     Head: Normocephalic and atraumatic.  Eyes:     Conjunctiva/sclera: Conjunctivae normal.  Pulmonary:     Effort: Pulmonary effort is normal. No respiratory distress.  Musculoskeletal:     Right lower leg: No edema.     Left lower leg: No edema.  Skin:    General: Skin is dry.     Findings: Erythema and lesion present.     Comments: RLE  Neurological:     General: No focal deficit present.     Mental Status: He is alert and oriented to person, place, and time.     Gait: Gait normal.    Blood pressure (!) 137/90, pulse 72, temperature 98.6 F (37 C), temperature source Oral, resp. rate 20, SpO2 98%. There is no height or weight on file to calculate BMI.  Treatment Plan Summary: Daily contact with patient to assess and evaluate symptoms and progress in treatment and Medication management  A/P:  Mood much better, still anxious, likely 2/2 withdrawal. No need for SSRI at this time. But will consider increasing gabapentin is still really anxious  AUD, severe (sz, no DT)  SIMD CIWA with ativan PRN per protocol with thiamine & MV supplement Continued librium taper (ends 04/14/2023) STARTED naltrexone 50 mg qPM Continued gabapentin 200 mg BID   Nicotine use d/o NRTs Encouraged cessation   RLE Cellulitis RESTARTED clindamycin 450 mg TID x7d (04/18/2023)  Dispo:  [facility]hab   Princess Bruins, DO Psych Resident, PGY-3 04/11/2023 3:21 PM

## 2023-04-11 NOTE — ED Notes (Signed)
Pt sleeping at present, no distress noted.  Monitoring for safety. 

## 2023-04-11 NOTE — Progress Notes (Signed)
Gary Mclaughlin, 52 y.o., male patient seen face to face by this provider, due to elevated b/p of 160/100. On evaluation Velmer Manahan is laying in bed reading a book, and appears to be in no acute distress.  He is alert, oriented x 4, calm, cooperative and attentive.  His mood is pleasant with congruent affect.  He has normal speech, and behavior. He denies any symptoms of headache, chest pain, difficulty breathing Nausea, vomiting, or any vision changes. He does rate his anxiety 6/10 with 10 being severe. Patient was given PRN Clonidine 0.1 mg for hypertension. Will follow up.
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
## 2023-04-12 DIAGNOSIS — F1024 Alcohol dependence with alcohol-induced mood disorder: Secondary | ICD-10-CM | POA: Diagnosis not present

## 2023-04-12 DIAGNOSIS — L03115 Cellulitis of right lower limb: Secondary | ICD-10-CM | POA: Diagnosis not present

## 2023-04-12 DIAGNOSIS — F10239 Alcohol dependence with withdrawal, unspecified: Secondary | ICD-10-CM | POA: Diagnosis not present

## 2023-04-12 DIAGNOSIS — Z5902 Unsheltered homelessness: Secondary | ICD-10-CM | POA: Diagnosis not present

## 2023-04-12 MED ORDER — GABAPENTIN 300 MG PO CAPS
300.0000 mg | ORAL_CAPSULE | Freq: Two times a day (BID) | ORAL | Status: DC
  Administered 2023-04-12 – 2023-04-14 (×4): 300 mg via ORAL
  Filled 2023-04-12 (×2): qty 1
  Filled 2023-04-12: qty 28
  Filled 2023-04-12 (×2): qty 1

## 2023-04-12 MED ORDER — CLOTRIMAZOLE 1 % EX CREA
TOPICAL_CREAM | Freq: Two times a day (BID) | CUTANEOUS | Status: DC
Start: 2023-04-12 — End: 2023-04-14
  Administered 2023-04-13: 1 via TOPICAL
  Filled 2023-04-12: qty 15
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
## 2023-04-12 NOTE — ED Provider Notes (Signed)
Behavioral Health Progress Note  Date and Time: 04/12/2023 4:13 PM Name: Gary Mclaughlin MRN:  [id number]  Subjective:  Gary Mclaughlin is a 52 y.o. male, with PMH of AUD (sz, no DT), tobacco use d/o, SIMD, housing instability, remote suicide attempt and inpatient psych admission, who presented to [facility] (04/10/2023) then admitted to [facility] the same day for EtOH detox and treatment    Sleep: Good Appetite:  Good   Had significant cravings of etoh last night, debated leaving last night. Still feels anxious, but it comes in waves throughout the day, likely w/d.  Denied rx side effects to rx thus far.  Denied active and passive SI, HI, AVH, paranoia.   Review of Systems  Constitutional:  Positive for diaphoresis and malaise/fatigue.  Respiratory:  Negative for shortness of breath and wheezing.   Cardiovascular:  Negative for chest pain.  Gastrointestinal:  Positive for heartburn and nausea. Negative for diarrhea and vomiting.  Neurological:  Positive for headaches. Negative for dizziness and weakness.      Diagnosis:  Final diagnoses:  Transaminitis  Tobacco abuse  Alcohol use disorder  Alcohol-induced mood disorder with depressive symptoms (HCC)  Cellulitis of right lower extremity  Does not have primary care provider    Past Psychiatric History:  Dx: AUD (sz, no DT), tobacco use d/o, SIMD Suicide attempt: yes remote - in the setting of etoh use - 52yo Hospitalization: yes remote - 52yo  Past Medical History:  Does not see PCP EtOH w/d sz x1  Family History:  Father and grandfather, maternal uncles had alcohol problems.   Family Psychiatric  History: NA  Social History:  Unhoused Unemployed No support  Total Time spent with patient: 30 minutes  Additional Social History:                         Current Medications:  Current Facility-Administered Medications  Medication Dose Route Frequency Provider Last Rate Last Admin   acetaminophen (TYLENOL) tablet 650  mg  650 mg Oral Q6H PRN Lance Muss, MD       alum & mag hydroxide-simeth (MAALOX/MYLANTA) 200-200-20 MG/5ML suspension 30 mL  30 mL Oral Q4H PRN White, Patrice L, NP   30 mL at 04/12/23 0623   chlordiazePOXIDE (LIBRIUM) capsule 25 mg  25 mg Oral Q6H PRN Princess Bruins, DO   25 mg at 04/12/23 2440   chlordiazePOXIDE (LIBRIUM) capsule 25 mg  25 mg Oral TID Princess Bruins, DO   25 mg at 04/12/23 1506   Followed by   Melene Muller ON 04/13/2023] chlordiazePOXIDE (LIBRIUM) capsule 25 mg  25 mg Oral Doreatha Martin, DO       Followed by   Melene Muller ON 04/14/2023] chlordiazePOXIDE (LIBRIUM) capsule 25 mg  25 mg Oral Daily Princess Bruins, DO       clindamycin (CLEOCIN) capsule 450 mg  450 mg Oral Q8H Princess Bruins, DO   450 mg at 04/12/23 1506   cloNIDine (CATAPRES) tablet 0.1 mg  0.1 mg Oral BID PRN White, Patrice L, NP   0.1 mg at 04/12/23 1027   clotrimazole (LOTRIMIN) 1 % cream   Topical BID Princess Bruins, DO   Given at 04/12/23 1506   gabapentin (NEURONTIN) capsule 300 mg  300 mg Oral BID Princess Bruins, DO       hydrOXYzine (ATARAX) tablet 25 mg  25 mg Oral Q6H PRN White, Patrice L, NP   25 mg at 04/12/23 1038   loperamide (IMODIUM) capsule 2-4  mg  2-4 mg Oral PRN White, Patrice L, NP       magnesium hydroxide (MILK OF MAGNESIA) suspension 30 mL  30 mL Oral Daily PRN White, Patrice L, NP       multivitamin with minerals tablet 1 tablet  1 tablet Oral Daily White, Patrice L, NP   1 tablet at 04/12/23 0836   naltrexone (DEPADE) tablet 50 mg  50 mg Oral QHS Princess Bruins, DO   50 mg at 04/11/23 2106   nicotine (NICODERM CQ - dosed in mg/24 hours) patch 21 mg  21 mg Transdermal Daily White, Patrice L, NP   21 mg at 04/11/23 0923   ondansetron (ZOFRAN-ODT) disintegrating tablet 4 mg  4 mg Oral Q6H PRN White, Patrice L, NP   4 mg at 04/12/23 6578   thiamine (VITAMIN B1) tablet 100 mg  100 mg Oral Daily Princess Bruins, DO   100 mg at 04/12/23 0836   traZODone (DESYREL) tablet 50 mg  50 mg Oral QHS PRN White,  Patrice L, NP   50 mg at 04/11/23 2108   No current outpatient medications on file.    Labs  Lab Results:  Admission on 04/09/2023, Discharged on 04/10/2023  Component Date Value Ref Range Status   WBC 04/09/2023 4.4  4.0 - 10.5 K/uL Final   RBC 04/09/2023 3.84 (L)  4.22 - 5.81 MIL/uL Final   Hemoglobin 04/09/2023 12.6 (L)  13.0 - 17.0 g/dL Final   HCT 46/96/2952 36.6 (L)  39.0 - 52.0 % Final   MCV 04/09/2023 95.3  80.0 - 100.0 fL Final   MCH 04/09/2023 32.8  26.0 - 34.0 pg Final   MCHC 04/09/2023 34.4  30.0 - 36.0 g/dL Final   RDW 84/13/2440 16.7 (H)  11.5 - 15.5 % Final   Platelets 04/09/2023 319  150 - 400 K/uL Final   nRBC 04/09/2023 0.0  0.0 - 0.2 % Final   Neutrophils Relative % 04/09/2023 64  % Final   Neutro Abs 04/09/2023 2.8  1.7 - 7.7 K/uL Final   Lymphocytes Relative 04/09/2023 22  % Final   Lymphs Abs 04/09/2023 1.0  0.7 - 4.0 K/uL Final   Monocytes Relative 04/09/2023 5  % Final   Monocytes Absolute 04/09/2023 0.2  0.1 - 1.0 K/uL Final   Eosinophils Relative 04/09/2023 6  % Final   Eosinophils Absolute 04/09/2023 0.3  0.0 - 0.5 K/uL Final   Basophils Relative 04/09/2023 2  % Final   Basophils Absolute 04/09/2023 0.1  0.0 - 0.1 K/uL Final   Immature Granulocytes 04/09/2023 1  % Final   Abs Immature Granulocytes 04/09/2023 0.04  0.00 - 0.07 K/uL Final   Performed at [facility], [address] [place], [place] [address]   Sodium 04/09/2023 136  135 - 145 mmol/L Final   Potassium 04/09/2023 4.4  3.5 - 5.1 mmol/L Final   Chloride 04/09/2023 99  98 - 111 mmol/L Final   CO2 04/09/2023 25  22 - 32 mmol/L Final   Glucose, Bld 04/09/2023 101 (H)  70 - 99 mg/dL Final   Glucose reference range applies only to samples taken after fasting for at least 8 hours.   BUN 04/09/2023 9  6 - 20 mg/dL Final   Creatinine, Ser 04/09/2023 0.70  0.61 - 1.24 mg/dL Final   Calcium 53/66/4403 10.1  8.9 - 10.3 mg/dL Final   Total Protein 47/42/5956 7.6  6.5 - 8.1 g/dL Final    Albumin 38/75/6433 3.8  3.5 -  5.0 g/dL Final   AST 60/45/4098 62 (H)  15 - 41 U/L Final   ALT 04/09/2023 32  0 - 44 U/L Final   Alkaline Phosphatase 04/09/2023 90  38 - 126 U/L Final   Total Bilirubin 04/09/2023 0.4  <1.2 mg/dL Final   GFR, Estimated 04/09/2023 >60  >60 mL/min Final   Comment: (NOTE) Calculated using the CKD-EPI Creatinine Equation (2021)    Anion gap 04/09/2023 12  5 - 15 Final   Performed at [facility], [address], [place], [place] [address]   Hgb A1c MFr Bld 04/09/2023 5.4  4.8 - 5.6 % Final   Comment: (NOTE) Pre diabetes:          5.7%-6.4%  Diabetes:              >6.4%  Glycemic control for   <7.0% adults with diabetes    Mean Plasma Glucose 04/09/2023 108.28  mg/dL Final   Performed at [facility], [address] [place], [place] [address]   Alcohol, Ethyl (B) 04/09/2023 349 (HH)  <10 mg/dL Final   Comment: CRITICAL RESULT CALLED TO, READ BACK BY AND VERIFIED WITH Carolynn Comment RN 04/09/2023 2003 BNUNNERY MT (NOTE) Lowest detectable limit for serum alcohol is 10 mg/dL.  For medical purposes only. Performed at [facility], [address] [place], [place] [address]    Cholesterol 04/09/2023 235 (H)  0 - 200 mg/dL Final   Triglycerides 86/57/8469 116  <150 mg/dL Final   HDL 62/95/2841 119  >40 mg/dL Final   Total CHOL/HDL Ratio 04/09/2023 2.0  RATIO Final   VLDL 04/09/2023 23  0 - 40 mg/dL Final   LDL Cholesterol 04/09/2023 93  0 - 99 mg/dL Final   Comment:        Total Cholesterol/HDL:CHD Risk Coronary Heart Disease Risk Table                     Men   Women  1/2 Average Risk   3.4   3.3  Average Risk       5.0   4.4  2 X Average Risk   9.6   7.1  3 X Average Risk  23.4   11.0        Use the calculated Patient Ratio above and the CHD Risk Table to determine the patient's CHD Risk.        ATP III CLASSIFICATION (LDL):  <100     mg/dL   Optimal  324-401  mg/dL   Near or Above                    Optimal  130-159  mg/dL    Borderline  027-253  mg/dL   High  >664     mg/dL   Very High Performed at [facility], [address], [place], [place] [address]    TSH 04/09/2023 1.422  0.350 - 4.500 uIU/mL Final   Comment: Performed by a 3rd Generation assay with a functional sensitivity of <=0.01 uIU/mL. Performed at [facility], [address] [place], [place] [address]    POC Amphetamine UR 04/09/2023 None Detected  NONE DETECTED (Cut Off Level 1000 ng/mL) Final   POC Secobarbital (BAR) 04/09/2023 None Detected  NONE DETECTED (Cut Off Level 300 ng/mL) Final   POC Buprenorphine (BUP) 04/09/2023 None Detected  NONE DETECTED (Cut Off Level 10 ng/mL) Final   POC Oxazepam (BZO) 04/09/2023  None Detected  NONE DETECTED (Cut Off Level 300 ng/mL) Final   POC Cocaine UR 04/09/2023 None Detected  NONE DETECTED (Cut Off Level 300 ng/mL) Final   POC Methamphetamine UR 04/09/2023 None Detected  NONE DETECTED (Cut Off Level 1000 ng/mL) Final   POC Morphine 04/09/2023 None Detected  NONE DETECTED (Cut Off Level 300 ng/mL) Final   POC Methadone UR 04/09/2023 None Detected  NONE DETECTED (Cut Off Level 300 ng/mL) Final   POC Oxycodone UR 04/09/2023 None Detected  NONE DETECTED (Cut Off Level 100 ng/mL) Final   POC Marijuana UR 04/09/2023 None Detected  NONE DETECTED (Cut Off Level 50 ng/mL) Final  Admission on 03/15/2023, Discharged on 03/15/2023  Component Date Value Ref Range Status   WBC 03/15/2023 6.8  4.0 - 10.5 K/uL Final   RBC 03/15/2023 3.92 (L)  4.22 - 5.81 MIL/uL Final   Hemoglobin 03/15/2023 12.6 (L)  13.0 - 17.0 g/dL Final   HCT 29/56/2130 36.6 (L)  39.0 - 52.0 % Final   MCV 03/15/2023 93.4  80.0 - 100.0 fL Final   MCH 03/15/2023 32.1  26.0 - 34.0 pg Final   MCHC 03/15/2023 34.4  30.0 - 36.0 g/dL Final   RDW 86/57/8469 17.1 (H)  11.5 - 15.5 % Final   Platelets 03/15/2023 271  150 - 400 K/uL Final   nRBC 03/15/2023 0.0  0.0 - 0.2 % Final   Neutrophils Relative % 03/15/2023 71  % Final   Neutro Abs  03/15/2023 4.9  1.7 - 7.7 K/uL Final   Lymphocytes Relative 03/15/2023 19  % Final   Lymphs Abs 03/15/2023 1.3  0.7 - 4.0 K/uL Final   Monocytes Relative 03/15/2023 8  % Final   Monocytes Absolute 03/15/2023 0.5  0.1 - 1.0 K/uL Final   Eosinophils Relative 03/15/2023 1  % Final   Eosinophils Absolute 03/15/2023 0.1  0.0 - 0.5 K/uL Final   Basophils Relative 03/15/2023 1  % Final   Basophils Absolute 03/15/2023 0.1  0.0 - 0.1 K/uL Final   Immature Granulocytes 03/15/2023 0  % Final   Abs Immature Granulocytes 03/15/2023 0.02  0.00 - 0.07 K/uL Final   Performed at [facility], [address], [place], [place] [address]   Alcohol, Ethyl (B) 03/15/2023 366 (HH)  <10 mg/dL Final   Comment: CRITICAL RESULT CALLED TO, READ BACK BY AND VERIFIED WITH HOPKINS,D. RN AT 1556 03/15/23 MULLINS,T (NOTE) Lowest detectable limit for serum alcohol is 10 mg/dL.  For medical purposes only. Performed at [facility], [address], [place], [place] [address]    Sodium 03/15/2023 129 (L)  135 - 145 mmol/L Final   Potassium 03/15/2023 4.0  3.5 - 5.1 mmol/L Final   Chloride 03/15/2023 92 (L)  98 - 111 mmol/L Final   CO2 03/15/2023 27  22 - 32 mmol/L Final   Glucose, Bld 03/15/2023 99  70 - 99 mg/dL Final   Glucose reference range applies only to samples taken after fasting for at least 8 hours.   BUN 03/15/2023 5 (L)  6 - 20 mg/dL Final   Creatinine, Ser 03/15/2023 0.55 (L)  0.61 - 1.24 mg/dL Final   Calcium 44/05/270 9.1  8.9 - 10.3 mg/dL Final   GFR, Estimated 03/15/2023 >60  >60 mL/min Final   Comment: (NOTE) Calculated using the CKD-EPI Creatinine Equation (2021)    Anion gap 03/15/2023 10  5 - 15 Final   Performed at [facility], [address]  [place], [place] [address]  Admission on 02/17/2023, Discharged on 02/17/2023  Component Date Value Ref Range Status   Sodium 02/17/2023 141  135 - 145 mmol/L Final   Potassium 02/17/2023 4.3   3.5 - 5.1 mmol/L Final   Chloride 02/17/2023 106  98 - 111 mmol/L Final   CO2 02/17/2023 23  22 - 32 mmol/L Final   Glucose, Bld 02/17/2023 87  70 - 99 mg/dL Final   Glucose reference range applies only to samples taken after fasting for at least 8 hours.   BUN 02/17/2023 12  6 - 20 mg/dL Final   Creatinine, Ser 02/17/2023 0.85  0.61 - 1.24 mg/dL Final   Calcium 60/45/4098 9.7  8.9 - 10.3 mg/dL Final   Total Protein 11/91/4782 8.0  6.5 - 8.1 g/dL Final   Albumin 95/62/1308 4.4  3.5 - 5.0 g/dL Final   AST 65/78/4696 43 (H)  15 - 41 U/L Final   ALT 02/17/2023 36  0 - 44 U/L Final   Alkaline Phosphatase 02/17/2023 78  38 - 126 U/L Final   Total Bilirubin 02/17/2023 0.4  0.3 - 1.2 mg/dL Final   GFR, Estimated 02/17/2023 >60  >60 mL/min Final   Comment: (NOTE) Calculated using the CKD-EPI Creatinine Equation (2021)    Anion gap 02/17/2023 12  5 - 15 Final   Performed at [facility], [address] [place], [place] [address]   Alcohol, Ethyl (B) 02/17/2023 381 (HH)  <10 mg/dL Final   Comment: CRITICAL RESULT CALLED TO, READ BACK BY AND VERIFIED WITH MERRICKS, L. RN @ 0100 02/17/23 JBUTLER (NOTE) Lowest detectable limit for serum alcohol is 10 mg/dL.  For medical purposes only. Performed at [facility], [address] [place], [place] [address]    WBC 02/17/2023 7.1  4.0 - 10.5 K/uL Final   RBC 02/17/2023 4.63  4.22 - 5.81 MIL/uL Final   Hemoglobin 02/17/2023 14.6  13.0 - 17.0 g/dL Final   HCT 40/02/2724 44.4  39.0 - 52.0 % Final   MCV 02/17/2023 95.9  80.0 - 100.0 fL Final   MCH 02/17/2023 31.5  26.0 - 34.0 pg Final   MCHC 02/17/2023 32.9  30.0 - 36.0 g/dL Final   RDW 36/64/4034 16.4 (H)  11.5 - 15.5 % Final   Platelets 02/17/2023 521 (H)  150 - 400 K/uL Final   nRBC 02/17/2023 0.0  0.0 - 0.2 % Final   Performed at [facility], [address] [place], [place] [address]  Admission on 11/17/2022, Discharged on 11/17/2022  Component Date Value Ref Range Status    Sodium 11/17/2022 141  135 - 145 mmol/L Final   Potassium 11/17/2022 3.5  3.5 - 5.1 mmol/L Final   Chloride 11/17/2022 107  98 - 111 mmol/L Final   CO2 11/17/2022 22  22 - 32 mmol/L Final   Glucose, Bld 11/17/2022 128 (H)  70 - 99 mg/dL Final   Glucose reference range applies only to samples taken after fasting for at least 8 hours.   BUN 11/17/2022 8  6 - 20 mg/dL Final   Creatinine, Ser 11/17/2022 0.78  0.61 - 1.24 mg/dL Final   Calcium 56/38/7564 9.1  8.9 - 10.3 mg/dL Final   Total Protein 33/29/5188 6.7  6.5 - 8.1 g/dL Final   Albumin 41/66/0630 3.4 (L)  3.5 - 5.0 g/dL Final   AST 16/05/930 32  15 - 41 U/L Final   ALT 11/17/2022 25  0 - 44 U/L Final  Alkaline Phosphatase 11/17/2022 74  38 - 126 U/L Final   Total Bilirubin 11/17/2022 0.3  0.3 - 1.2 mg/dL Final   GFR, Estimated 11/17/2022 >60  >60 mL/min Final   Comment: (NOTE) Calculated using the CKD-EPI Creatinine Equation (2021)    Anion gap 11/17/2022 12  5 - 15 Final   Performed at [facility], [address] [place], [place] [address]   Alcohol, Ethyl (B) 11/17/2022 251 (H)  <10 mg/dL Final   Comment: (NOTE) Lowest detectable limit for serum alcohol is 10 mg/dL.  For medical purposes only. Performed at [facility], [address], [place], [place] [address]    WBC 11/17/2022 5.6  4.0 - 10.5 K/uL Final   RBC 11/17/2022 3.71 (L)  4.22 - 5.81 MIL/uL Final   Hemoglobin 11/17/2022 11.5 (L)  13.0 - 17.0 g/dL Final   HCT 09/81/1914 34.8 (L)  39.0 - 52.0 % Final   MCV 11/17/2022 93.8  80.0 - 100.0 fL Final   MCH 11/17/2022 31.0  26.0 - 34.0 pg Final   MCHC 11/17/2022 33.0  30.0 - 36.0 g/dL Final   RDW 78/29/5621 12.1  11.5 - 15.5 % Final   Platelets 11/17/2022 290  150 - 400 K/uL Final   nRBC 11/17/2022 0.0  0.0 - 0.2 % Final   Neutrophils Relative % 11/17/2022 41  % Final   Neutro Abs 11/17/2022 2.3  1.7 - 7.7 K/uL Final   Lymphocytes Relative 11/17/2022 37  % Final   Lymphs Abs 11/17/2022 2.1  0.7 -  4.0 K/uL Final   Monocytes Relative 11/17/2022 5  % Final   Monocytes Absolute 11/17/2022 0.3  0.1 - 1.0 K/uL Final   Eosinophils Relative 11/17/2022 15  % Final   Eosinophils Absolute 11/17/2022 0.8 (H)  0.0 - 0.5 K/uL Final   Basophils Relative 11/17/2022 2  % Final   Basophils Absolute 11/17/2022 0.1  0.0 - 0.1 K/uL Final   Immature Granulocytes 11/17/2022 0  % Final   Abs Immature Granulocytes 11/17/2022 0.01  0.00 - 0.07 K/uL Final   Performed at [facility], [address] [place], [place] [address]  Admission on 11/16/2022, Discharged on 11/17/2022  Component Date Value Ref Range Status   WBC 11/16/2022 6.0  4.0 - 10.5 K/uL Final   RBC 11/16/2022 3.87 (L)  4.22 - 5.81 MIL/uL Final   Hemoglobin 11/16/2022 11.8 (L)  13.0 - 17.0 g/dL Final   HCT 78/46/9629 35.6 (L)  39.0 - 52.0 % Final   MCV 11/16/2022 92.0  80.0 - 100.0 fL Final   MCH 11/16/2022 30.5  26.0 - 34.0 pg Final   MCHC 11/16/2022 33.1  30.0 - 36.0 g/dL Final   RDW 52/84/1324 12.4  11.5 - 15.5 % Final   Platelets 11/16/2022 325  150 - 400 K/uL Final   nRBC 11/16/2022 0.0  0.0 - 0.2 % Final   Neutrophils Relative % 11/16/2022 43  % Final   Neutro Abs 11/16/2022 2.6  1.7 - 7.7 K/uL Final   Lymphocytes Relative 11/16/2022 37  % Final   Lymphs Abs 11/16/2022 2.2  0.7 - 4.0 K/uL Final   Monocytes Relative 11/16/2022 5  % Final   Monocytes Absolute 11/16/2022 0.3  0.1 - 1.0 K/uL Final   Eosinophils Relative 11/16/2022 13  % Final   Eosinophils Absolute 11/16/2022 0.8 (H)  0.0 - 0.5 K/uL Final   Basophils Relative 11/16/2022 2  % Final   Basophils Absolute 11/16/2022 0.1  0.0 - 0.1 K/uL Final   Immature Granulocytes 11/16/2022 0  % Final   Abs Immature Granulocytes 11/16/2022 0.01  0.00 - 0.07 K/uL Final   Performed at [facility], [address] [place], [place] [address]   Sodium 11/16/2022 141  135 - 145 mmol/L Final   Potassium 11/16/2022 4.1  3.5 - 5.1 mmol/L Final   Chloride 11/16/2022 107  98 - 111  mmol/L Final   CO2 11/16/2022 23  22 - 32 mmol/L Final   Glucose, Bld 11/16/2022 96  70 - 99 mg/dL Final   Glucose reference range applies only to samples taken after fasting for at least 8 hours.   BUN 11/16/2022 8  6 - 20 mg/dL Final   Creatinine, Ser 11/16/2022 0.80  0.61 - 1.24 mg/dL Final   Calcium 62/83/1517 9.5  8.9 - 10.3 mg/dL Final   Total Protein 61/60/7371 6.9  6.5 - 8.1 g/dL Final   Albumin 10/29/9483 3.8  3.5 - 5.0 g/dL Final   AST 46/27/0350 32  15 - 41 U/L Final   ALT 11/16/2022 27  0 - 44 U/L Final   Alkaline Phosphatase 11/16/2022 79  38 - 126 U/L Final   Total Bilirubin 11/16/2022 0.4  0.3 - 1.2 mg/dL Final   GFR, Estimated 11/16/2022 >60  >60 mL/min Final   Comment: (NOTE) Calculated using the CKD-EPI Creatinine Equation (2021)    Anion gap 11/16/2022 11  5 - 15 Final   Performed at [facility], [address] [place], [place] [address]   Alcohol, Ethyl (B) 11/16/2022 350 (HH)  <10 mg/dL Final   Comment: CRITICAL RESULT CALLED TO, READ BACK BY AND VERIFIED WITH Clearnce Hasten, RN. [phone] 11/16/22. LPAIT (NOTE) Lowest detectable limit for serum alcohol is 10 mg/dL.  For medical purposes only. Performed at [facility], [address] [place], [place] [address]    Cholesterol 11/16/2022 209 (H)  0 - 200 mg/dL Final   Triglycerides 67/89/3810 134  <150 mg/dL Final   HDL 17/51/0258 55  >40 mg/dL Final   Total CHOL/HDL Ratio 11/16/2022 3.8  RATIO Final   VLDL 11/16/2022 27  0 - 40 mg/dL Final   LDL Cholesterol 11/16/2022 127 (H)  0 - 99 mg/dL Final   Comment:        Total Cholesterol/HDL:CHD Risk Coronary Heart Disease Risk Table                     Men   Women  1/2 Average Risk   3.4   3.3  Average Risk       5.0   4.4  2 X Average Risk   9.6   7.1  3 X Average Risk  23.4   11.0        Use the calculated Patient Ratio above and the CHD Risk Table to determine the patient's CHD Risk.        ATP III CLASSIFICATION (LDL):  <100     mg/dL   Optimal   527-782  mg/dL   Near or Above                    Optimal  130-159  mg/dL   Borderline  423-536  mg/dL   High  >144     mg/dL   Very High Performed at [facility], [address], [place], [place] [address]    POC Amphetamine UR 11/16/2022 None Detected  NONE DETECTED (Cut Off Level 1000 ng/mL) Final   POC Secobarbital (BAR) 11/16/2022 None Detected  NONE DETECTED (Cut Off Level 300 ng/mL) Final   POC Buprenorphine (BUP) 11/16/2022 None Detected  NONE DETECTED (Cut Off Level 10 ng/mL) Final   POC Oxazepam (BZO) 11/16/2022 Positive (A)  NONE DETECTED (Cut Off Level 300 ng/mL) Final   POC Cocaine UR 11/16/2022 None Detected  NONE DETECTED (Cut Off Level 300 ng/mL) Final   POC Methamphetamine UR 11/16/2022 None Detected  NONE DETECTED (Cut Off Level 1000 ng/mL) Final   POC Morphine 11/16/2022 None Detected  NONE DETECTED (Cut Off Level 300 ng/mL) Final   POC Methadone UR 11/16/2022 None Detected  NONE DETECTED (Cut Off Level 300 ng/mL) Final   POC Oxycodone UR 11/16/2022 None Detected  NONE DETECTED (Cut Off Level 100 ng/mL) Final   POC Marijuana UR 11/16/2022 None Detected  NONE DETECTED (Cut Off Level 50 ng/mL) Final    Blood Alcohol level:  Lab Results  Component Value Date   ETH 349 (HH) 04/09/2023   ETH 366 (HH) 03/15/2023    Metabolic Disorder Labs: Lab Results  Component Value Date   HGBA1C 5.4 04/09/2023   MPG 108.28 04/09/2023   MPG 116.89 05/02/2021   No results found for: "PROLACTIN" Lab Results  Component Value Date   CHOL 235 (H) 04/09/2023   TRIG 116 04/09/2023   HDL 119 04/09/2023   CHOLHDL 2.0 04/09/2023   VLDL 23 04/09/2023   LDLCALC 93 04/09/2023   LDLCALC 127 (H) 11/16/2022    Therapeutic Lab Levels: No results found for: "LITHIUM" No results found for: "VALPROATE" No results found for: "CBMZ"  Physical Findings   AUDIT    Flowsheet Row ED from 04/10/2023 in [facility] Admission (Discharged) from 05/01/2014 in  [facility]AL [facility]BSERVATION [facility]  Alcohol Use Disorder Identification Test Final Score (AUDIT) 34 32      CAGE-AID    Flowsheet Row ED to Hosp-Admission (Discharged) from 04/16/2022 in [place] [facility]  CAGE-AID Score 3      PHQ2-9    Flowsheet Row ED from 04/10/2023 in [facility] ED from 04/09/2023 in [facility] ED from 11/17/2022 in [facility] ED from 05/02/2021 in [facility]  PHQ-2 Total Score 2 2 0 2  PHQ-9 Total Score 7 7 1 4       Flowsheet Row ED from 04/10/2023 in [facility] ED from 04/09/2023 in [facility] ED from 03/15/2023 in [facility] at [facility]  C-SSRS RISK CATEGORY High Risk High Risk No Risk        Musculoskeletal  Strength & Muscle Tone: within normal limited Gait & Station: normal  Patient leans: NA   Psychiatric Specialty Exam  Presentation  General Appearance:  Disheveled   Eye Contact: Fair   Speech: Clear and Coherent; Normal Rate   Speech Volume: Normal   Handedness: Right    Mood and Affect  Mood: Anxious   Affect: Appropriate; Congruent; Restricted    Thought Process  Thought Processes: Coherent; Goal Directed; Linear   Descriptions of Associations:Intact   Orientation:Full (Time, Place and Person)   Thought Content:Rumination; Perseveration   Diagnosis of Schizophrenia or Schizoaffective disorder in past: No   Duration of Psychotic Symptoms: NA   Hallucinations:Hallucinations: None    Ideas of Reference:None   Suicidal Thoughts:Suicidal Thoughts: No  Homicidal Thoughts:Homicidal Thoughts: No     Sensorium  Memory: Immediate Good  Judgment: Fair  Insight: Shallow   Executive Functions  Concentration: Good  Attention Span: Good  Recall: Good  Fund  of Knowledge: Good  Language: Good   Psychomotor Activity  Psychomotor Activity: Psychomotor Activity: Normal    Assets  Assets: Communication Skills; Desire for Improvement; Resilience   Sleep  Sleep: Sleep: Good    Physical Exam  Physical Exam Vitals and nursing note reviewed.  HENT:     Head: Normocephalic and atraumatic.  Eyes:     Conjunctiva/sclera: Conjunctivae normal.  Pulmonary:     Effort: Pulmonary effort is normal. No respiratory distress.  Musculoskeletal:     Right lower leg: No edema.     Left lower leg: No edema.  Skin:    General: Skin is dry.     Findings: Erythema and lesion present.     Comments: RLE  Neurological:     General: No focal deficit present.     Mental Status: He is alert and oriented to person, place, and time.     Gait: Gait normal.    Blood pressure 123/85, pulse 74, temperature 99.2 F (37.3 C), temperature source Oral, resp. rate 17, SpO2 95%. There is no height or weight on file to calculate BMI.  Treatment Plan Summary: Daily contact with patient to assess and evaluate symptoms and progress in treatment and Medication management  A/P:  Still anxious from w/d, CIWA 8 the AM requiring PRN. BP was also elevated, requiring PRNs, suspect 2/2  w/d, but will continue to monitor. And having cravings, increasing his gabapentin.   Wound continues to improve, but now there is a rash, unclear etiology, No other sxs to suggest dangerous allergic reaction. He was sleeping in the woods prior to coming here, possible tic bite? Will continue to monitor,  AUD, severe (sz, no DT)  SIMD CIWA with ativan PRN per protocol with thiamine & MV supplement Continued librium taper (ends 04/14/2023) Continued naltrexone 50 mg qPM INCREASED gabapentin 200 mg BID to 300 mg BID  Nicotine use d/o NRTs Encouraged cessation   RLE Cellulitis  Rash unspecified Continued clindamycin 450 mg TID x7d (04/18/2023)  Dispo:  Residential  rehab   Princess Bruins, DO Psych Resident, PGY-3 04/12/2023 4:13 PM

## 2023-04-12 NOTE — ED Notes (Signed)
Pt sleeping at present, no distress noted.  Monitoring for safety. 
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
## 2023-04-12 NOTE — Progress Notes (Signed)
Pt was visible in the milieu and interacted with peers. Pt complained of anxiety and PRN Hydroxyzine was administered per order. No signs of acute distress noted. Staff will monitor for pt's safety.

## 2023-04-12 NOTE — ED Notes (Signed)
Patient in the bedroom calm and sleeping. Will continue to monitor for safety.

## 2023-04-12 NOTE — ED Notes (Signed)
Pt is in the Dayroom watching TV with other patients . Composed and pleasant. NAD.Respirations even and unlabored. Will continue to monitor for safety.

## 2023-04-12 NOTE — Progress Notes (Signed)
Pt is awake, alert and oriented X3. Pt did not voice any complaints of pain or discomfort. No signs of acute distress noted. Administered scheduled meds per order. Pt denies current SI/HI/AVH, plan or intent. Staff will monitor for pt's safety.
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
## 2023-04-13 DIAGNOSIS — Z5902 Unsheltered homelessness: Secondary | ICD-10-CM | POA: Diagnosis not present

## 2023-04-13 DIAGNOSIS — F1024 Alcohol dependence with alcohol-induced mood disorder: Secondary | ICD-10-CM | POA: Diagnosis not present

## 2023-04-13 DIAGNOSIS — F10239 Alcohol dependence with withdrawal, unspecified: Secondary | ICD-10-CM | POA: Diagnosis not present

## 2023-04-13 DIAGNOSIS — L03115 Cellulitis of right lower limb: Secondary | ICD-10-CM | POA: Diagnosis not present

## 2023-04-13 MED ORDER — CLOTRIMAZOLE 1 % EX CREA: TOPICAL_CREAM | Freq: Two times a day (BID) | CUTANEOUS | 0 refills | Status: DC

## 2023-04-13 MED ORDER — HYDROXYZINE HCL 25 MG PO TABS: 25.0000 mg | ORAL_TABLET | Freq: Three times a day (TID) | ORAL | 0 refills | Status: DC | PRN

## 2023-04-13 MED ORDER — TRAZODONE HCL 50 MG PO TABS: 50.0000 mg | ORAL_TABLET | Freq: Every evening | ORAL | 0 refills | Status: DC | PRN

## 2023-04-13 MED ORDER — HYDROXYZINE HCL 25 MG PO TABS
25.0000 mg | ORAL_TABLET | Freq: Three times a day (TID) | ORAL | Status: DC | PRN
  Administered 2023-04-13: 25 mg via ORAL
  Filled 2023-04-13: qty 1
  Filled 2023-04-13: qty 20

## 2023-04-13 MED ORDER — NICOTINE 21 MG/24HR TD PT24: 21.0000 mg | MEDICATED_PATCH | Freq: Every day | TRANSDERMAL | 0 refills | Status: DC

## 2023-04-13 MED ORDER — NALTREXONE HCL 50 MG PO TABS: 50.0000 mg | ORAL_TABLET | Freq: Every day | ORAL | 0 refills | Status: DC

## 2023-04-13 MED ORDER — GABAPENTIN 300 MG PO CAPS: 300.0000 mg | ORAL_CAPSULE | Freq: Two times a day (BID) | ORAL | 0 refills | Status: DC

## 2023-04-13 NOTE — ED Notes (Signed)
 Pt was provided lunch

## 2023-04-13 NOTE — ED Notes (Signed)
Patient came to nurses station stating he can not sleep. Patient was given Trazodone at 904pm however he feels it is not working.

## 2023-04-13 NOTE — ED Notes (Signed)
Patient is currently sitting in dayroom attending groups, he is no acute distress, patient denies thoughts of S/I and H/I. The patient denies he is in any pain. Patient reports he is not having any withdrawal symptoms at this time, however they do come in waves and if he begins to experience any he will let me know.

## 2023-04-13 NOTE — Discharge Instructions (Addendum)
Patient will be discharging to [facility] on Monday 04/14/2023 by 9:00am with transportation provided via [employer]. Address is [address] [place], [place] [address].   Follow-up recommendations:  Activity:  Normal, as tolerated Diet:  Per PCP recommendation  Patient is instructed prior to discharge to: Take all medications as prescribed by his mental healthcare provider. Report any adverse effects and/or reactions from the medicines to his outpatient provider promptly. Patient has been instructed & cautioned: To not engage in alcohol and or illegal drug use while on prescription medicines.  In the event of worsening symptoms, patient is instructed to call the crisis hotline at 988, 911 and or go to the nearest ED for appropriate evaluation and treatment of symptoms. To follow-up with his primary care provider for your other medical issues, concerns and or health care needs.   [facility] [address], [place], [address] [phone] phone  New Patient Assessment/Therapy Walk-Ins:  Monday and Wednesday: 8 am until slots are full. Every 1st and 2nd Fridays of the month: 1 pm - 5 pm.  NO ASSESSMENT/THERAPY WALK-INS ON TUESDAYS OR THURSDAYS  New Patient Assessment/Medication Management Walk-Ins:  Monday - Friday:  8 am - 11 am.  For all walk-ins, we ask that you arrive by 7:30 am because patients will be seen in the order of arrival.  Availability is limited; therefore, you may not be seen on the same day that you walk-in.  Our goal is to serve and meet the needs of our community to the best of our ability.  SUBSTANCE USE TREATMENT for Medicaid and State Funded/IPRS  [facility] [address], [place], [address] [phone] phone NOTE: [facility] is no longer offering IOP services.  Serves those who are low-income or have no insurance.  [facility] [address], [place], [place], [address] [phone]  phone [phone] fax NOTE: Does have Substance Abuse-Intensive Outpatient Program [facility]) as well as transitional housing if eligible.  [facility] [address] [place], [place], [address] [phone] phone [phone] fax  [facility] [phone] [place], [place], [address] [phone] phone [phone] fax  HALFWAY HOUSES:  [facility] [phone]  [employer].oxfordvacancies.com  12 STEP PROGRAMS:  Alcoholics Anonymous of [employer]  Narcotics Anonymous of  [employer]  Al-Anon of [place], [place] www.[place]alanon.org/find-meetings.html  Nar-Anon https://nar-anon.org/find-a-meetin  List of Residential placements:   [facility] in [place]: [phone]  [facility]: [phone]  [place], [place] [phone]: Male and male facility; 30-day program: (uninsured and Medicaid such as Laurena Bering, [place], [place], partners)  [facility]: [phone]; men and women's facility; 28 days; Can have Medicaid tailored plan Tour manager or Partners)  [facility]: [phone] Karoline Caldwell or Larita Fife; 28 day program; must be fully detox; tailored Medicaid or no insurance  [address] in [place], [place]; [phone]; 28 day all males program; no insurance accepted  [facility] in [place]: Gabriel Rung [phone] (no insurance or Medicaid only); 90 days; outpatient services but provide housing in apartments downtown [place]  RTS Admission: [phone]: Patient must complete phone screening for placement: [place], [place]; 6 month program; uninsured, Medicaid, and [employer].   [facility]: no insurance required; [phone]  [facility]: [phone]; Intake: Molly Maduro; Must fill out application online; Alecia Lemming Delay [phone] x [address]  [facility] in [place], [place]: [phone]; Admissions Coordinators Mr. Maurine Minister or Barron Alvine; 90 day program.  [facility]: [place], [place] [phone]; Co-Ed 9 month to a year program; Online application; Men entry fee is $500 (6-73months);  [employer]: [address] [place], [place] [address]; no fee or insurance required; minimum of 2 years; Highly structured; work based; Intake Coordinator is Thayer Ohm [phone]  [facility] in [place], [place]: [phone]; Fax number is [phone]; website: www.[facility].org; Requires 3-6 page autobiography; 2 year program (18 months and then 66month transitional housing); Admission fee is $300; no insurance needed; work Automotive engineer in [place], [place]: [employer] Desk Staff: Danise Edge [phone]: They have a Men's Regenerations Program 6-73months. Free program; There is an initial $300 fee however, they are willing to work with patients regarding that. Application is online.  [facility]: Admissions [phone] Doran Heater ext [phone]; Any 7-90 day program is out of pocket; 12 month program is free of charge; there is a $275 entry fee; Patient is responsible for own transportation

## 2023-04-13 NOTE — ED Notes (Signed)
Pt was provided dinner.

## 2023-04-13 NOTE — ED Notes (Signed)
Patient sitting in dayroom. No acute distress noted. No concerns voiced. Informed patient to notify staff with any needs or assistance. Patient verbalized understanding or agreement. Safety checks in place per facility policy.

## 2023-04-13 NOTE — ED Provider Notes (Signed)
FBC/OBS ASAP Discharge Summary  Date and Time: 04/13/2023 3:06 PM  Name: Gary Mclaughlin  MRN:  [id number]   Discharge Diagnoses:  Final diagnoses:  Transaminitis  Tobacco abuse  Alcohol use disorder  Alcohol-induced mood disorder with depressive symptoms (HCC)  Cellulitis of right lower extremity  Does not have primary care provider    Subjective: Gary Mclaughlin is a 52 y.o. male, with PMH of AUD (sz, no DT), tobacco use d/o, SIMD, housing instability, remote suicide attempt and inpatient psych admission, who [facility] to [facility] (04/10/2023) then admitted to [facility] the same day for EtOH detox and treatment   Stay Summary: The patient was evaluated each day by a clinical provider to ascertain response to treatment. Improvement was noted by the patient's report of decreasing symptoms, improved sleep and appetite, affect, medication tolerance, behavior, and participation in unit programming.  Patient was asked each day to complete a self inventory noting mood, mental status, pain, new symptoms, anxiety and concerns.  The patient's medications were managed with the following directions: AUD, severe (sz, no DT)  SIMD CIWA with ativan PRN per protocol with thiamine & MV supplement Continued librium taper (ends 04/14/2023) Continued naltrexone 50 mg qPM Continue gabapentin 300 mg BID   Nicotine use d/o NRTs Encouraged cessation    RLE Cellulitis  Rash unspecified Continued clindamycin 450 mg TID x7d (04/18/2023)    Patient responded well to medication and being in a therapeutic and supportive environment. Positive and appropriate behavior was noted and the patient was motivated for recovery. The patient worked closely with the treatment team and case manager to develop a discharge plan with appropriate goals. Coping skills, problem solving as well as relaxation therapies were also part of the unit programming.   Total Time spent with patient: 30 minutes  Past Psychiatric History:  Dx:  AUD (sz, no DT), tobacco use d/o, SIMD Suicide attempt: yes remote - in the setting of etoh use - 52yo Hospitalization: yes remote - 52yo   Past Medical History:  Does not see PCP EtOH w/d sz x1   Family History:  Father and grandfather, maternal uncles had alcohol problems.    Family Psychiatric  History: NA   Social History:  Unhoused Unemployed No support   Tobacco Cessation:  A prescription for an FDA-approved tobacco cessation medication provided at discharge  Current Medications:  Current Facility-Administered Medications  Medication Dose Route Frequency Provider Last Rate Last Admin   acetaminophen (TYLENOL) tablet 650 mg  650 mg Oral Q6H PRN Lance Muss, MD       alum & mag hydroxide-simeth (MAALOX/MYLANTA) 200-200-20 MG/5ML suspension 30 mL  30 mL Oral Q4H PRN White, Patrice L, NP   30 mL at 04/12/23 0623   chlordiazePOXIDE (LIBRIUM) capsule 25 mg  25 mg Oral Q6H PRN Princess Bruins, DO   25 mg at 04/12/23 4782   chlordiazePOXIDE (LIBRIUM) capsule 25 mg  25 mg Oral Doreatha Martin, DO       Followed by   Melene Muller ON 04/14/2023] chlordiazePOXIDE (LIBRIUM) capsule 25 mg  25 mg Oral Daily Princess Bruins, DO       clindamycin (CLEOCIN) capsule 450 mg  450 mg Oral Q8H Princess Bruins, DO   450 mg at 04/13/23 9562   cloNIDine (CATAPRES) tablet 0.1 mg  0.1 mg Oral BID PRN White, Patrice L, NP   0.1 mg at 04/12/23 1308   clotrimazole (LOTRIMIN) 1 % cream   Topical BID Princess Bruins, DO   Given at 04/13/23  1122   gabapentin (NEURONTIN) capsule 300 mg  300 mg Oral BID Princess Bruins, DO   300 mg at 04/13/23 1610   hydrOXYzine (ATARAX) tablet 25 mg  25 mg Oral TID PRN Kizzie Ide B, MD   25 mg at 04/13/23 1245   magnesium hydroxide (MILK OF MAGNESIA) suspension 30 mL  30 mL Oral Daily PRN White, Patrice L, NP   30 mL at 04/13/23 1009   multivitamin with minerals tablet 1 tablet  1 tablet Oral Daily White, Patrice L, NP   1 tablet at 04/13/23 1006   naltrexone (DEPADE) tablet  50 mg  50 mg Oral QHS Princess Bruins, DO   50 mg at 04/12/23 1943   nicotine (NICODERM CQ - dosed in mg/24 hours) patch 21 mg  21 mg Transdermal Daily White, Patrice L, NP   21 mg at 04/11/23 9604   thiamine (VITAMIN B1) tablet 100 mg  100 mg Oral Daily Princess Bruins, DO   100 mg at 04/13/23 1006   traZODone (DESYREL) tablet 50 mg  50 mg Oral QHS PRN White, Patrice L, NP   50 mg at 04/12/23 2112   Current Outpatient Medications  Medication Sig Dispense Refill   clotrimazole (LOTRIMIN) 1 % cream Apply topically 2 (two) times daily. 30 g 0   gabapentin (NEURONTIN) 300 MG capsule Take 1 capsule (300 mg total) by mouth 2 (two) times daily. 60 capsule 0   hydrOXYzine (ATARAX) 25 MG tablet Take 1 tablet (25 mg total) by mouth 3 (three) times daily as needed for anxiety. 30 tablet 0   naltrexone (DEPADE) 50 MG tablet Take 1 tablet (50 mg total) by mouth at bedtime. 30 tablet 0   [START ON 04/14/2023] nicotine (NICODERM CQ - DOSED IN MG/24 HOURS) 21 mg/24hr patch Place 1 patch (21 mg total) onto the skin daily. 28 patch 0   traZODone (DESYREL) 50 MG tablet Take 1 tablet (50 mg total) by mouth at bedtime as needed for sleep. 30 tablet 0    PTA Medications:  Facility Ordered Medications  Medication   alum & mag hydroxide-simeth (MAALOX/MYLANTA) 200-200-20 MG/5ML suspension 30 mL   cloNIDine (CATAPRES) tablet 0.1 mg   [EXPIRED] hydrOXYzine (ATARAX) tablet 25 mg   [EXPIRED] loperamide (IMODIUM) capsule 2-4 mg   [COMPLETED] LORazepam (ATIVAN) tablet 1 mg   magnesium hydroxide (MILK OF MAGNESIA) suspension 30 mL   multivitamin with minerals tablet 1 tablet   nicotine (NICODERM CQ - dosed in mg/24 hours) patch 21 mg   [EXPIRED] ondansetron (ZOFRAN-ODT) disintegrating tablet 4 mg   traZODone (DESYREL) tablet 50 mg   acetaminophen (TYLENOL) tablet 650 mg   chlordiazePOXIDE (LIBRIUM) capsule 25 mg   thiamine (VITAMIN B1) tablet 100 mg   clindamycin (CLEOCIN) capsule 450 mg   naltrexone (DEPADE) tablet  50 mg   [EXPIRED] chlordiazePOXIDE (LIBRIUM) capsule 25 mg   Followed by   [COMPLETED] chlordiazePOXIDE (LIBRIUM) capsule 25 mg   Followed by   chlordiazePOXIDE (LIBRIUM) capsule 25 mg   Followed by   Melene Muller ON 04/14/2023] chlordiazePOXIDE (LIBRIUM) capsule 25 mg   clotrimazole (LOTRIMIN) 1 % cream   gabapentin (NEURONTIN) capsule 300 mg   hydrOXYzine (ATARAX) tablet 25 mg   PTA Medications  Medication Sig   hydrOXYzine (ATARAX) 25 MG tablet Take 1 tablet (25 mg total) by mouth 3 (three) times daily as needed for anxiety.   [START ON 04/14/2023] nicotine (NICODERM CQ - DOSED IN MG/24 HOURS) 21 mg/24hr patch Place 1 patch (21 mg total)  onto the skin daily.   traZODone (DESYREL) 50 MG tablet Take 1 tablet (50 mg total) by mouth at bedtime as needed for sleep.   naltrexone (DEPADE) 50 MG tablet Take 1 tablet (50 mg total) by mouth at bedtime.   gabapentin (NEURONTIN) 300 MG capsule Take 1 capsule (300 mg total) by mouth 2 (two) times daily.   clotrimazole (LOTRIMIN) 1 % cream Apply topically 2 (two) times daily.       04/13/2023    8:26 AM 04/10/2023    1:36 PM 04/10/2023    9:50 AM  Depression screen PHQ 2/9  Decreased Interest 1 1 1   Down, Depressed, Hopeless 1 1 1   PHQ - 2 Score 2 2 2   Altered sleeping 1 1 1   Tired, decreased energy 1 1 1   Change in appetite 0 0 0  Feeling bad or failure about yourself  1 1 1   Trouble concentrating 1 1 1   Moving slowly or fidgety/restless 1 1 1   Suicidal thoughts 0 0 0  PHQ-9 Score 7 7 7   Difficult doing work/chores   Somewhat difficult    Flowsheet Row ED from 04/10/2023 in [facility] ED from 04/09/2023 in [facility] ED from 03/15/2023 in [facility] Emergency Department at [facility]  C-SSRS RISK CATEGORY High Risk High Risk No Risk       Musculoskeletal  Strength & Muscle Tone: within normal limits Gait & Station: normal Patient leans: N/A  Psychiatric Specialty  Exam  Presentation  General Appearance:  Disheveled  Eye Contact: Fair  Speech: Clear and Coherent; Normal Rate  Speech Volume: Normal  Handedness: Right   Mood and Affect  Mood: Depressed; Anxious  Affect: Congruent; Appropriate; Restricted   Thought Process  Thought Processes: Coherent; Goal Directed  Descriptions of Associations:Intact  Orientation:Full (Time, Place and Person)  Thought Content:WDL  Diagnosis of Schizophrenia or Schizoaffective disorder in past: No    Hallucinations:Hallucinations: None  Ideas of Reference:None  Suicidal Thoughts:Suicidal Thoughts: No  Homicidal Thoughts:Homicidal Thoughts: No   Sensorium  Memory: Remote Fair  Judgment: Fair  Insight: Fair   Art therapist  Concentration: Good  Attention Span: Good  Recall: Good  Fund of Knowledge: Good  Language: Good   Psychomotor Activity  Psychomotor Activity: Psychomotor Activity: Normal   Assets  Assets: Communication Skills; Desire for Improvement; Resilience   Sleep  Sleep: Sleep: Fair   Physical Exam  Physical Exam Vitals reviewed.  Constitutional:      Appearance: Normal appearance.  HENT:     Head: Normocephalic and atraumatic.  Cardiovascular:     Rate and Rhythm: Normal rate.  Pulmonary:     Effort: Pulmonary effort is normal.  Neurological:     Mental Status: He is alert and oriented to person, place, and time.    Review of Systems  Constitutional: Negative.   Cardiovascular: Negative.   Gastrointestinal: Negative.   Musculoskeletal: Negative.   Neurological: Negative.    Blood pressure 138/82, pulse 85, temperature 98.2 F (36.8 C), temperature source Oral, resp. rate 18, SpO2 99%. There is no height or weight on file to calculate BMI.  Demographic Factors:  Male, Caucasian, and Low socioeconomic status  Loss Factors: Decline in physical health  Historical Factors: Impulsivity  Risk Reduction Factors:    Employed  Continued Clinical Symptoms:  Alcohol/Substance Abuse/Dependencies  Cognitive Features That Contribute To Risk:  Closed-mindedness    Suicide Risk:  Mild:  Suicidal ideation of limited frequency, intensity,  duration, and specificity.  There are no identifiable plans, no associated intent, mild dysphoria and related symptoms, good self-control (both objective and subjective assessment), few other risk factors, and identifiable protective factors, including available and accessible social support.  Plan Of Care/Follow-up recommendations:  Activity as tolerated Regular diet Continue prescription medications See PCP for medical conditions   Disposition: Ronnie Doss, MD 04/13/2023, 3:06 PM

## 2023-04-13 NOTE — Discharge Planning (Signed)
Referral was received and per Marcelino Duster, patient has been accepted and can transfer to the facility on tomorrow 04/14/2023 by 9:00am. Update has been provided to the patient and MD made aware. Patient will need a 14-30 day supply of medication and one month refill. No nicotine gum allowed, however 14-30 day nicotine patches to be provided if needed. No other needs to report at this time.    LCSW will continue to follow up and provide updates as received.    Fernande Boyden, LCSW Clinical Social Worker [place] [facility] Ph: [phone]

## 2023-04-13 NOTE — ED Notes (Signed)
Pt was provided breakfast.

## 2023-04-13 NOTE — ED Provider Notes (Signed)
Behavioral Health Progress Note  Date and Time: 04/13/2023 9:29 AM Name: Gary Mclaughlin MRN:  [id number]  Subjective:  Gary Mclaughlin is a 52 y.o. male, with PMH of AUD (sz, no DT), tobacco use d/o, SIMD, housing instability, remote suicide attempt and inpatient psych admission, who presented to [facility] (04/10/2023) then admitted to [facility] the same day for EtOH detox and treatment   Patient seen in the milieu watching tv, no acute distress. Patient reports feeling "anxious for the future" today. Patient reports poor sleep with difficulty staying asleep and good appetite. Regarding withdrawal symptoms, he denies. Regarding cravings, he reports two nights ago was the last night and he almost left. He states his motivating factors include his health, money, and wanting a better job. Patient denies current SI, HI, and AVH. Regarding discharge plans, he is wanting either [facility] or [facility].   Review of Systems  Constitutional:  Positive for diaphoresis and malaise/fatigue.  Respiratory:  Negative for shortness of breath and wheezing.   Cardiovascular:  Negative for chest pain.  Gastrointestinal:  Positive for heartburn and nausea. Negative for diarrhea and vomiting.  Neurological:  Positive for headaches. Negative for dizziness and weakness.      Diagnosis:  Final diagnoses:  Transaminitis  Tobacco abuse  Alcohol use disorder  Alcohol-induced mood disorder with depressive symptoms (HCC)  Cellulitis of right lower extremity  Does not have primary care provider    Past Psychiatric History:  Dx: AUD (sz, no DT), tobacco use d/o, SIMD Suicide attempt: yes remote - in the setting of etoh use - 52yo Hospitalization: yes remote - 52yo  Past Medical History:  Does not see PCP EtOH w/d sz x1  Family History:  Father and grandfather, maternal uncles had alcohol problems.   Family Psychiatric  History: NA  Social History:  Unhoused Unemployed No support  Total Time spent with  patient: 20 minutes   Current Medications:  Current Facility-Administered Medications  Medication Dose Route Frequency Provider Last Rate Last Admin   acetaminophen (TYLENOL) tablet 650 mg  650 mg Oral Q6H PRN Lance Muss, MD       alum & mag hydroxide-simeth (MAALOX/MYLANTA) 200-200-20 MG/5ML suspension 30 mL  30 mL Oral Q4H PRN White, Patrice L, NP   30 mL at 04/12/23 7829   chlordiazePOXIDE (LIBRIUM) capsule 25 mg  25 mg Oral Q6H PRN Princess Bruins, DO   25 mg at 04/12/23 5621   chlordiazePOXIDE (LIBRIUM) capsule 25 mg  25 mg Oral Doreatha Martin, DO       Followed by   Melene Muller ON 04/14/2023] chlordiazePOXIDE (LIBRIUM) capsule 25 mg  25 mg Oral Daily Princess Bruins, DO       clindamycin (CLEOCIN) capsule 450 mg  450 mg Oral Q8H Princess Bruins, DO   450 mg at 04/13/23 3086   cloNIDine (CATAPRES) tablet 0.1 mg  0.1 mg Oral BID PRN White, Patrice L, NP   0.1 mg at 04/12/23 5784   clotrimazole (LOTRIMIN) 1 % cream   Topical BID Princess Bruins, DO   Given at 04/12/23 2112   gabapentin (NEURONTIN) capsule 300 mg  300 mg Oral BID Princess Bruins, DO   300 mg at 04/13/23 6962   magnesium hydroxide (MILK OF MAGNESIA) suspension 30 mL  30 mL Oral Daily PRN White, Patrice L, NP       multivitamin with minerals tablet 1 tablet  1 tablet Oral Daily White, Patrice L, NP   1 tablet at 04/12/23 0836   naltrexone (  DEPADE) tablet 50 mg  50 mg Oral QHS Princess Bruins, DO   50 mg at 04/12/23 1943   nicotine (NICODERM CQ - dosed in mg/24 hours) patch 21 mg  21 mg Transdermal Daily White, Patrice L, NP   21 mg at 04/11/23 5409   thiamine (VITAMIN B1) tablet 100 mg  100 mg Oral Daily Princess Bruins, DO   100 mg at 04/12/23 0836   traZODone (DESYREL) tablet 50 mg  50 mg Oral QHS PRN White, Patrice L, NP   50 mg at 04/12/23 2112   No current outpatient medications on file.    Labs  Lab Results:  Admission on 04/09/2023, Discharged on 04/10/2023  Component Date Value Ref Range Status   WBC 04/09/2023 4.4   4.0 - 10.5 K/uL Final   RBC 04/09/2023 3.84 (L)  4.22 - 5.81 MIL/uL Final   Hemoglobin 04/09/2023 12.6 (L)  13.0 - 17.0 g/dL Final   HCT 81/19/1478 36.6 (L)  39.0 - 52.0 % Final   MCV 04/09/2023 95.3  80.0 - 100.0 fL Final   MCH 04/09/2023 32.8  26.0 - 34.0 pg Final   MCHC 04/09/2023 34.4  30.0 - 36.0 g/dL Final   RDW 29/56/2130 16.7 (H)  11.5 - 15.5 % Final   Platelets 04/09/2023 319  150 - 400 K/uL Final   nRBC 04/09/2023 0.0  0.0 - 0.2 % Final   Neutrophils Relative % 04/09/2023 64  % Final   Neutro Abs 04/09/2023 2.8  1.7 - 7.7 K/uL Final   Lymphocytes Relative 04/09/2023 22  % Final   Lymphs Abs 04/09/2023 1.0  0.7 - 4.0 K/uL Final   Monocytes Relative 04/09/2023 5  % Final   Monocytes Absolute 04/09/2023 0.2  0.1 - 1.0 K/uL Final   Eosinophils Relative 04/09/2023 6  % Final   Eosinophils Absolute 04/09/2023 0.3  0.0 - 0.5 K/uL Final   Basophils Relative 04/09/2023 2  % Final   Basophils Absolute 04/09/2023 0.1  0.0 - 0.1 K/uL Final   Immature Granulocytes 04/09/2023 1  % Final   Abs Immature Granulocytes 04/09/2023 0.04  0.00 - 0.07 K/uL Final   Performed at [facility], [address], [place], [place] [address]   Sodium 04/09/2023 136  135 - 145 mmol/L Final   Potassium 04/09/2023 4.4  3.5 - 5.1 mmol/L Final   Chloride 04/09/2023 99  98 - 111 mmol/L Final   CO2 04/09/2023 25  22 - 32 mmol/L Final   Glucose, Bld 04/09/2023 101 (H)  70 - 99 mg/dL Final   Glucose reference range applies only to samples taken after fasting for at least 8 hours.   BUN 04/09/2023 9  6 - 20 mg/dL Final   Creatinine, Ser 04/09/2023 0.70  0.61 - 1.24 mg/dL Final   Calcium 46/96/2952 10.1  8.9 - 10.3 mg/dL Final   Total Protein 84/13/2440 7.6  6.5 - 8.1 g/dL Final   Albumin 03/01/2535 3.8  3.5 - 5.0 g/dL Final   AST 64/40/3474 62 (H)  15 - 41 U/L Final   ALT 04/09/2023 32  0 - 44 U/L Final   Alkaline Phosphatase 04/09/2023 90  38 - 126 U/L Final   Total Bilirubin 04/09/2023 0.4  <1.2 mg/dL  Final   GFR, Estimated 04/09/2023 >60  >60 mL/min Final   Comment: (NOTE) Calculated using the CKD-EPI Creatinine Equation (2021)    Anion gap 04/09/2023 12  5 - 15 Final   Performed at [facility],  [address], [place], [place] [address]   Hgb A1c MFr Bld 04/09/2023 5.4  4.8 - 5.6 % Final   Comment: (NOTE) Pre diabetes:          5.7%-6.4%  Diabetes:              >6.4%  Glycemic control for   <7.0% adults with diabetes    Mean Plasma Glucose 04/09/2023 108.28  mg/dL Final   Performed at [facility], [address], [place], [place] [address]   Alcohol, Ethyl (B) 04/09/2023 349 (HH)  <10 mg/dL Final   Comment: CRITICAL RESULT CALLED TO, READ BACK BY AND VERIFIED WITH Carolynn Comment RN 04/09/2023 2003 BNUNNERY MT (NOTE) Lowest detectable limit for serum alcohol is 10 mg/dL.  For medical purposes only. Performed at [facility], [address] [place], [place] [address]    Cholesterol 04/09/2023 235 (H)  0 - 200 mg/dL Final   Triglycerides 95/62/1308 116  <150 mg/dL Final   HDL 65/78/4696 119  >40 mg/dL Final   Total CHOL/HDL Ratio 04/09/2023 2.0  RATIO Final   VLDL 04/09/2023 23  0 - 40 mg/dL Final   LDL Cholesterol 04/09/2023 93  0 - 99 mg/dL Final   Comment:        Total Cholesterol/HDL:CHD Risk Coronary Heart Disease Risk Table                     Men   Women  1/2 Average Risk   3.4   3.3  Average Risk       5.0   4.4  2 X Average Risk   9.6   7.1  3 X Average Risk  23.4   11.0        Use the calculated Patient Ratio above and the CHD Risk Table to determine the patient's CHD Risk.        ATP III CLASSIFICATION (LDL):  <100     mg/dL   Optimal  295-284  mg/dL   Near or Above                    Optimal  130-159  mg/dL   Borderline  132-440  mg/dL   High  >102     mg/dL   Very High Performed at [facility], [address] [place], [place] [address]    TSH 04/09/2023 1.422  0.350 - 4.500 uIU/mL Final   Comment: Performed by a 3rd  Generation assay with a functional sensitivity of <=0.01 uIU/mL. Performed at [facility], [address] [place], [place] [address]    POC Amphetamine UR 04/09/2023 None Detected  NONE DETECTED (Cut Off Level 1000 ng/mL) Final   POC Secobarbital (BAR) 04/09/2023 None Detected  NONE DETECTED (Cut Off Level 300 ng/mL) Final   POC Buprenorphine (BUP) 04/09/2023 None Detected  NONE DETECTED (Cut Off Level 10 ng/mL) Final   POC Oxazepam (BZO) 04/09/2023 None Detected  NONE DETECTED (Cut Off Level 300 ng/mL) Final   POC Cocaine UR 04/09/2023 None Detected  NONE DETECTED (Cut Off Level 300 ng/mL) Final   POC Methamphetamine UR 04/09/2023 None Detected  NONE DETECTED (Cut Off Level 1000 ng/mL) Final   POC Morphine 04/09/2023 None Detected  NONE DETECTED (Cut Off Level 300 ng/mL) Final   POC Methadone UR 04/09/2023 None Detected  NONE DETECTED (Cut Off Level 300 ng/mL) Final   POC Oxycodone UR 04/09/2023 None Detected  NONE DETECTED (  Cut Off Level 100 ng/mL) Final   POC Marijuana UR 04/09/2023 None Detected  NONE DETECTED (Cut Off Level 50 ng/mL) Final  Admission on 03/15/2023, Discharged on 03/15/2023  Component Date Value Ref Range Status   WBC 03/15/2023 6.8  4.0 - 10.5 K/uL Final   RBC 03/15/2023 3.92 (L)  4.22 - 5.81 MIL/uL Final   Hemoglobin 03/15/2023 12.6 (L)  13.0 - 17.0 g/dL Final   HCT 72/53/6644 36.6 (L)  39.0 - 52.0 % Final   MCV 03/15/2023 93.4  80.0 - 100.0 fL Final   MCH 03/15/2023 32.1  26.0 - 34.0 pg Final   MCHC 03/15/2023 34.4  30.0 - 36.0 g/dL Final   RDW 03/47/4259 17.1 (H)  11.5 - 15.5 % Final   Platelets 03/15/2023 271  150 - 400 K/uL Final   nRBC 03/15/2023 0.0  0.0 - 0.2 % Final   Neutrophils Relative % 03/15/2023 71  % Final   Neutro Abs 03/15/2023 4.9  1.7 - 7.7 K/uL Final   Lymphocytes Relative 03/15/2023 19  % Final   Lymphs Abs 03/15/2023 1.3  0.7 - 4.0 K/uL Final   Monocytes Relative 03/15/2023 8  % Final   Monocytes Absolute 03/15/2023 0.5  0.1 - 1.0  K/uL Final   Eosinophils Relative 03/15/2023 1  % Final   Eosinophils Absolute 03/15/2023 0.1  0.0 - 0.5 K/uL Final   Basophils Relative 03/15/2023 1  % Final   Basophils Absolute 03/15/2023 0.1  0.0 - 0.1 K/uL Final   Immature Granulocytes 03/15/2023 0  % Final   Abs Immature Granulocytes 03/15/2023 0.02  0.00 - 0.07 K/uL Final   Performed at [facility], [address], [place], [place] [address]   Alcohol, Ethyl (B) 03/15/2023 366 (HH)  <10 mg/dL Final   Comment: CRITICAL RESULT CALLED TO, READ BACK BY AND VERIFIED WITH HOPKINS,D. RN AT 1556 03/15/23 MULLINS,T (NOTE) Lowest detectable limit for serum alcohol is 10 mg/dL.  For medical purposes only. Performed at [facility], [address] [place], [place] [address]    Sodium 03/15/2023 129 (L)  135 - 145 mmol/L Final   Potassium 03/15/2023 4.0  3.5 - 5.1 mmol/L Final   Chloride 03/15/2023 92 (L)  98 - 111 mmol/L Final   CO2 03/15/2023 27  22 - 32 mmol/L Final   Glucose, Bld 03/15/2023 99  70 - 99 mg/dL Final   Glucose reference range applies only to samples taken after fasting for at least 8 hours.   BUN 03/15/2023 5 (L)  6 - 20 mg/dL Final   Creatinine, Ser 03/15/2023 0.55 (L)  0.61 - 1.24 mg/dL Final   Calcium 29/51/8841 9.1  8.9 - 10.3 mg/dL Final   GFR, Estimated 03/15/2023 >60  >60 mL/min Final   Comment: (NOTE) Calculated using the CKD-EPI Creatinine Equation (2021)    Anion gap 03/15/2023 10  5 - 15 Final   Performed at [facility], [address], [place], [place] [address]  Admission on 02/17/2023, Discharged on 02/17/2023  Component Date Value Ref Range Status   Sodium 02/17/2023 141  135 - 145 mmol/L Final   Potassium 02/17/2023 4.3  3.5 - 5.1 mmol/L Final   Chloride 02/17/2023 106  98 - 111 mmol/L Final   CO2 02/17/2023 23  22 - 32 mmol/L Final   Glucose, Bld 02/17/2023 87  70 - 99 mg/dL Final   Glucose reference range applies only to samples taken  after fasting for at least  8 hours.   BUN 02/17/2023 12  6 - 20 mg/dL Final   Creatinine, Ser 02/17/2023 0.85  0.61 - 1.24 mg/dL Final   Calcium 34/74/2595 9.7  8.9 - 10.3 mg/dL Final   Total Protein 63/87/5643 8.0  6.5 - 8.1 g/dL Final   Albumin 32/95/1884 4.4  3.5 - 5.0 g/dL Final   AST 16/60/6301 43 (H)  15 - 41 U/L Final   ALT 02/17/2023 36  0 - 44 U/L Final   Alkaline Phosphatase 02/17/2023 78  38 - 126 U/L Final   Total Bilirubin 02/17/2023 0.4  0.3 - 1.2 mg/dL Final   GFR, Estimated 02/17/2023 >60  >60 mL/min Final   Comment: (NOTE) Calculated using the CKD-EPI Creatinine Equation (2021)    Anion gap 02/17/2023 12  5 - 15 Final   Performed at [facility], [address], [place], [place] [address]   Alcohol, Ethyl (B) 02/17/2023 381 (HH)  <10 mg/dL Final   Comment: CRITICAL RESULT CALLED TO, READ BACK BY AND VERIFIED WITH MERRICKS, L. RN @ 0100 02/17/23 JBUTLER (NOTE) Lowest detectable limit for serum alcohol is 10 mg/dL.  For medical purposes only. Performed at [facility], [address] [place], [place] [address]    WBC 02/17/2023 7.1  4.0 - 10.5 K/uL Final   RBC 02/17/2023 4.63  4.22 - 5.81 MIL/uL Final   Hemoglobin 02/17/2023 14.6  13.0 - 17.0 g/dL Final   HCT 73/22/0254 44.4  39.0 - 52.0 % Final   MCV 02/17/2023 95.9  80.0 - 100.0 fL Final   MCH 02/17/2023 31.5  26.0 - 34.0 pg Final   MCHC 02/17/2023 32.9  30.0 - 36.0 g/dL Final   RDW 27/10/2374 16.4 (H)  11.5 - 15.5 % Final   Platelets 02/17/2023 521 (H)  150 - 400 K/uL Final   nRBC 02/17/2023 0.0  0.0 - 0.2 % Final   Performed at [facility], [address], [place], [place] [address]  Admission on 11/17/2022, Discharged on 11/17/2022  Component Date Value Ref Range Status   Sodium 11/17/2022 141  135 - 145 mmol/L Final   Potassium 11/17/2022 3.5  3.5 - 5.1 mmol/L Final   Chloride 11/17/2022 107  98 - 111 mmol/L Final   CO2 11/17/2022 22  22 - 32 mmol/L Final   Glucose, Bld 11/17/2022 128  (H)  70 - 99 mg/dL Final   Glucose reference range applies only to samples taken after fasting for at least 8 hours.   BUN 11/17/2022 8  6 - 20 mg/dL Final   Creatinine, Ser 11/17/2022 0.78  0.61 - 1.24 mg/dL Final   Calcium 17/61/6073 9.1  8.9 - 10.3 mg/dL Final   Total Protein 71/10/2692 6.7  6.5 - 8.1 g/dL Final   Albumin 85/46/2703 3.4 (L)  3.5 - 5.0 g/dL Final   AST 50/01/3817 32  15 - 41 U/L Final   ALT 11/17/2022 25  0 - 44 U/L Final   Alkaline Phosphatase 11/17/2022 74  38 - 126 U/L Final   Total Bilirubin 11/17/2022 0.3  0.3 - 1.2 mg/dL Final   GFR, Estimated 11/17/2022 >60  >60 mL/min Final   Comment: (NOTE) Calculated using the CKD-EPI Creatinine Equation (2021)    Anion gap 11/17/2022 12  5 - 15 Final   Performed at [facility], [address], [place], [place] [address]   Alcohol, Ethyl (B) 11/17/2022 251 (H)  <10 mg/dL Final   Comment: (NOTE) Lowest  detectable limit for serum alcohol is 10 mg/dL.  For medical purposes only. Performed at [facility], [address], [place], [place] [address]    WBC 11/17/2022 5.6  4.0 - 10.5 K/uL Final   RBC 11/17/2022 3.71 (L)  4.22 - 5.81 MIL/uL Final   Hemoglobin 11/17/2022 11.5 (L)  13.0 - 17.0 g/dL Final   HCT 19/14/7829 34.8 (L)  39.0 - 52.0 % Final   MCV 11/17/2022 93.8  80.0 - 100.0 fL Final   MCH 11/17/2022 31.0  26.0 - 34.0 pg Final   MCHC 11/17/2022 33.0  30.0 - 36.0 g/dL Final   RDW 56/21/3086 12.1  11.5 - 15.5 % Final   Platelets 11/17/2022 290  150 - 400 K/uL Final   nRBC 11/17/2022 0.0  0.0 - 0.2 % Final   Neutrophils Relative % 11/17/2022 41  % Final   Neutro Abs 11/17/2022 2.3  1.7 - 7.7 K/uL Final   Lymphocytes Relative 11/17/2022 37  % Final   Lymphs Abs 11/17/2022 2.1  0.7 - 4.0 K/uL Final   Monocytes Relative 11/17/2022 5  % Final   Monocytes Absolute 11/17/2022 0.3  0.1 - 1.0 K/uL Final   Eosinophils Relative 11/17/2022 15  % Final   Eosinophils Absolute 11/17/2022 0.8 (H)  0.0 - 0.5 K/uL  Final   Basophils Relative 11/17/2022 2  % Final   Basophils Absolute 11/17/2022 0.1  0.0 - 0.1 K/uL Final   Immature Granulocytes 11/17/2022 0  % Final   Abs Immature Granulocytes 11/17/2022 0.01  0.00 - 0.07 K/uL Final   Performed at [facility], [address] [place], [place] [address]  Admission on 11/16/2022, Discharged on 11/17/2022  Component Date Value Ref Range Status   WBC 11/16/2022 6.0  4.0 - 10.5 K/uL Final   RBC 11/16/2022 3.87 (L)  4.22 - 5.81 MIL/uL Final   Hemoglobin 11/16/2022 11.8 (L)  13.0 - 17.0 g/dL Final   HCT 96/29/5284 35.6 (L)  39.0 - 52.0 % Final   MCV 11/16/2022 92.0  80.0 - 100.0 fL Final   MCH 11/16/2022 30.5  26.0 - 34.0 pg Final   MCHC 11/16/2022 33.1  30.0 - 36.0 g/dL Final   RDW 13/24/4010 12.4  11.5 - 15.5 % Final   Platelets 11/16/2022 325  150 - 400 K/uL Final   nRBC 11/16/2022 0.0  0.0 - 0.2 % Final   Neutrophils Relative % 11/16/2022 43  % Final   Neutro Abs 11/16/2022 2.6  1.7 - 7.7 K/uL Final   Lymphocytes Relative 11/16/2022 37  % Final   Lymphs Abs 11/16/2022 2.2  0.7 - 4.0 K/uL Final   Monocytes Relative 11/16/2022 5  % Final   Monocytes Absolute 11/16/2022 0.3  0.1 - 1.0 K/uL Final   Eosinophils Relative 11/16/2022 13  % Final   Eosinophils Absolute 11/16/2022 0.8 (H)  0.0 - 0.5 K/uL Final   Basophils Relative 11/16/2022 2  % Final   Basophils Absolute 11/16/2022 0.1  0.0 - 0.1 K/uL Final   Immature Granulocytes 11/16/2022 0  % Final   Abs Immature Granulocytes 11/16/2022 0.01  0.00 - 0.07 K/uL Final   Performed at [facility], [address], [place], [place] [address]   Sodium 11/16/2022 141  135 - 145 mmol/L Final   Potassium 11/16/2022 4.1  3.5 - 5.1 mmol/L Final   Chloride 11/16/2022 107  98 - 111 mmol/L Final   CO2 11/16/2022 23  22 - 32 mmol/L Final  Glucose, Bld 11/16/2022 96  70 - 99 mg/dL Final   Glucose reference range applies only to samples taken after fasting for at least 8 hours.   BUN 11/16/2022 8  6 -  20 mg/dL Final   Creatinine, Ser 11/16/2022 0.80  0.61 - 1.24 mg/dL Final   Calcium 56/21/3086 9.5  8.9 - 10.3 mg/dL Final   Total Protein 57/84/6962 6.9  6.5 - 8.1 g/dL Final   Albumin 95/28/4132 3.8  3.5 - 5.0 g/dL Final   AST 44/05/270 32  15 - 41 U/L Final   ALT 11/16/2022 27  0 - 44 U/L Final   Alkaline Phosphatase 11/16/2022 79  38 - 126 U/L Final   Total Bilirubin 11/16/2022 0.4  0.3 - 1.2 mg/dL Final   GFR, Estimated 11/16/2022 >60  >60 mL/min Final   Comment: (NOTE) Calculated using the CKD-EPI Creatinine Equation (2021)    Anion gap 11/16/2022 11  5 - 15 Final   Performed at [facility], [address] [place], [place] [address]   Alcohol, Ethyl (B) 11/16/2022 350 (HH)  <10 mg/dL Final   Comment: CRITICAL RESULT CALLED TO, READ BACK BY AND VERIFIED WITH Clearnce Hasten, RN. [phone] 11/16/22. LPAIT (NOTE) Lowest detectable limit for serum alcohol is 10 mg/dL.  For medical purposes only. Performed at [facility], [address], [place], [place] [address]    Cholesterol 11/16/2022 209 (H)  0 - 200 mg/dL Final   Triglycerides 56/38/7564 134  <150 mg/dL Final   HDL 33/29/5188 55  >40 mg/dL Final   Total CHOL/HDL Ratio 11/16/2022 3.8  RATIO Final   VLDL 11/16/2022 27  0 - 40 mg/dL Final   LDL Cholesterol 11/16/2022 127 (H)  0 - 99 mg/dL Final   Comment:        Total Cholesterol/HDL:CHD Risk Coronary Heart Disease Risk Table                     Men   Women  1/2 Average Risk   3.4   3.3  Average Risk       5.0   4.4  2 X Average Risk   9.6   7.1  3 X Average Risk  23.4   11.0        Use the calculated Patient Ratio above and the CHD Risk Table to determine the patient's CHD Risk.        ATP III CLASSIFICATION (LDL):  <100     mg/dL   Optimal  416-606  mg/dL   Near or Above                    Optimal  130-159  mg/dL   Borderline  301-601  mg/dL   High  >093     mg/dL   Very High Performed at [facility], [address], [place], [place] [address]     POC Amphetamine UR 11/16/2022 None Detected  NONE DETECTED (Cut Off Level 1000 ng/mL) Final   POC Secobarbital (BAR) 11/16/2022 None Detected  NONE DETECTED (Cut Off Level 300 ng/mL) Final   POC Buprenorphine (BUP) 11/16/2022 None Detected  NONE DETECTED (Cut Off Level 10 ng/mL) Final   POC Oxazepam (BZO) 11/16/2022 Positive (A)  NONE DETECTED (Cut Off Level 300 ng/mL) Final   POC Cocaine UR 11/16/2022 None Detected  NONE DETECTED (Cut Off Level 300 ng/mL) Final   POC Methamphetamine UR 11/16/2022 None Detected  NONE DETECTED (Cut  Off Level 1000 ng/mL) Final   POC Morphine 11/16/2022 None Detected  NONE DETECTED (Cut Off Level 300 ng/mL) Final   POC Methadone UR 11/16/2022 None Detected  NONE DETECTED (Cut Off Level 300 ng/mL) Final   POC Oxycodone UR 11/16/2022 None Detected  NONE DETECTED (Cut Off Level 100 ng/mL) Final   POC Marijuana UR 11/16/2022 None Detected  NONE DETECTED (Cut Off Level 50 ng/mL) Final    Blood Alcohol level:  Lab Results  Component Value Date   ETH 349 (HH) 04/09/2023   ETH 366 (HH) 03/15/2023    Metabolic Disorder Labs: Lab Results  Component Value Date   HGBA1C 5.4 04/09/2023   MPG 108.28 04/09/2023   MPG 116.89 05/02/2021   No results found for: "PROLACTIN" Lab Results  Component Value Date   CHOL 235 (H) 04/09/2023   TRIG 116 04/09/2023   HDL 119 04/09/2023   CHOLHDL 2.0 04/09/2023   VLDL 23 04/09/2023   LDLCALC 93 04/09/2023   LDLCALC 127 (H) 11/16/2022    Therapeutic Lab Levels: No results found for: "LITHIUM" No results found for: "VALPROATE" No results found for: "CBMZ"  Physical Findings   AUDIT    Flowsheet Row ED from 04/10/2023 in [facility] Admission (Discharged) from 05/01/2014 in [facility]  Alcohol Use Disorder Identification Test Final Score (AUDIT) 34 32      CAGE-AID    Flowsheet Row ED to Hosp-Admission (Discharged) from 04/16/2022 in [place] [facility]  CAGE-AID Score 3      PHQ2-9    Flowsheet Row ED from 04/10/2023 in [facility] ED from 04/09/2023 in [facility] ED from 11/17/2022 in [facility] ED from 05/02/2021 in [facility]  PHQ-2 Total Score 2 2 0 2  PHQ-9 Total Score 7 7 1 4       Flowsheet Row ED from 04/10/2023 in [facility] ED from 04/09/2023 in [facility] ED from 03/15/2023 in [facility] Emergency Department at [facility]  C-SSRS RISK CATEGORY High Risk High Risk No Risk        Musculoskeletal  Strength & Muscle Tone: within normal limited Gait & Station: normal  Patient leans: NA   Psychiatric Specialty Exam  Presentation  General Appearance:  Disheveled   Eye Contact: Fair   Speech: Clear and Coherent; Normal Rate   Speech Volume: Normal   Handedness: Right    Mood and Affect  Mood: Depressed; Anxious   Affect: Congruent; Appropriate; Restricted    Thought Process  Thought Processes: Coherent; Goal Directed   Descriptions of Associations:Intact   Orientation:Full (Time, Place and Person)   Thought Content:WDL   Diagnosis of Schizophrenia or Schizoaffective disorder in past: No   Duration of Psychotic Symptoms: NA   Hallucinations:Hallucinations: None    Ideas of Reference:None   Suicidal Thoughts:Suicidal Thoughts: No    Homicidal Thoughts:Homicidal Thoughts: No     Sensorium  Memory: Remote Fair  Judgment: Fair  Insight: Fair   Art therapist  Concentration: Good  Attention Span: Good  Recall: Good  Fund of Knowledge: Good  Language: Good   Psychomotor Activity  Psychomotor Activity: Psychomotor Activity: Normal    Assets  Assets: Communication Skills; Desire for Improvement; Resilience   Sleep  Sleep: Sleep: Fair    Physical  Exam  Physical Exam Vitals and nursing note reviewed.  HENT:  Head: Normocephalic and atraumatic.  Eyes:     Conjunctiva/sclera: Conjunctivae normal.  Pulmonary:     Effort: Pulmonary effort is normal. No respiratory distress.  Musculoskeletal:     Right lower leg: No edema.     Left lower leg: No edema.  Skin:    General: Skin is dry.     Findings: Erythema and lesion present.     Comments: RLE  Neurological:     General: No focal deficit present.     Mental Status: He is alert and oriented to person, place, and time.     Gait: Gait normal.    Blood pressure (!) 137/98, pulse 78, temperature 98.2 F (36.8 C), temperature source Oral, resp. rate 18, SpO2 99%. There is no height or weight on file to calculate BMI.  Treatment Plan Summary: Daily contact with patient to assess and evaluate symptoms and progress in treatment and Medication management  A/P:  Still anxious from w/d, CIWA 8 the AM requiring PRN. BP was also elevated, requiring PRNs, suspect 2/2  w/d, but will continue to monitor. And having cravings, increasing his gabapentin.   Wound continues to improve, but now there is a rash, unclear etiology, No other sxs to suggest dangerous allergic reaction. He was [place] in the woods prior to coming here, possible tic bite? Will continue to monitor.  AUD, severe (sz, no DT)  SIMD CIWA with ativan PRN per protocol with thiamine & MV supplement Continued librium taper (ends 04/14/2023) Continued naltrexone 50 mg qPM Continue gabapentin 300 mg BID  Nicotine use d/o NRTs Encouraged cessation   RLE Cellulitis  Rash unspecified Continued clindamycin 450 mg TID x7d (04/18/2023)  Dispo:  Pending [facility]   Lance Muss, MD 04/13/2023 9:29 AM

## 2023-04-13 NOTE — ED Notes (Signed)
PRN atarax given for pt reported anxiety 6/10. Medication given with no complications. Environment secured, safety checks in place per facility policy.

## 2023-04-13 NOTE — Discharge Planning (Signed)
LCSW spoke with patient on this morning regarding disposition plans. Patient reports his plan is to go to [facility] for 28 days and then [facility] in [facility]. Patient reports "I have been doing this for too long, it's cold outside and I have to get it together". Patient aware that referral has been sent to [facility], and LCSW is just awaiting update. Once update has been received, LCSW will follow up to inform of plan.   Fernande Boyden, LCSW Clinical Social Worker [facility] Ph: [phone]

## 2023-04-13 NOTE — ED Notes (Signed)
Pt is currently sleeping, no distress noted, environmental check complete, will continue to monitor patient for safety.  

## 2023-04-13 NOTE — ED Notes (Signed)
Patient alert & oriented x4. Denies intent to harm self or others when asked. Denies A/VH. Patient reports anxiety and constipation. PRN milk of magnesium given for constipation and writer informed patient they would have to reach out to provider for medication for anxiety. Patient voiced understanding. Upon scheduled medication pass patient reports they are "living in the future" when asked to clarify patient responded with illogical connections (ie they can't drive due to legal issues). Writer unable to get further clarification on this. No acute distress noted. Support and encouragement provided. Routine safety checks conducted per facility protocol. Encouraged patient to notify staff if any thoughts of harm towards self or others arise. Patient verbalizes understanding and agreement.

## 2023-04-13 NOTE — ED Notes (Signed)
Patient resting with eyes closed in no apparent acute distress. Respirations even and unlabored. Environment secured. Safety checks in place according to facility policy.
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
## 2023-04-13 NOTE — ED Notes (Signed)
Pt in the bedroom calm and composed. NAD. Will continue to monitor for safety.
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
## 2023-04-13 NOTE — ED Notes (Signed)
Pt reports lessening of anxiety post PRN atarax, also reports bm post milk of mag

## 2023-04-14 DIAGNOSIS — Z5902 Unsheltered homelessness: Secondary | ICD-10-CM | POA: Diagnosis not present

## 2023-04-14 DIAGNOSIS — F10239 Alcohol dependence with withdrawal, unspecified: Secondary | ICD-10-CM | POA: Diagnosis not present

## 2023-04-14 DIAGNOSIS — L03115 Cellulitis of right lower limb: Secondary | ICD-10-CM | POA: Diagnosis not present

## 2023-04-14 DIAGNOSIS — F1024 Alcohol dependence with alcohol-induced mood disorder: Secondary | ICD-10-CM | POA: Diagnosis not present

## 2023-04-14 MED ORDER — TRAZODONE HCL 50 MG PO TABS
50.0000 mg | ORAL_TABLET | Freq: Once | ORAL | Status: AC
  Administered 2023-04-14: 50 mg via ORAL
  Filled 2023-04-14: qty 1

## 2023-04-14 NOTE — ED Notes (Signed)
Patient presented to nurses station stating he still is not able to sleep. This nurse advised the patient that unfortunately because it is 356am there wouldn't be anything else that he could be prescribed for sleep at this time of the morning. The patient elaborated that anytime he detoxes he has insomnia.

## 2023-04-20 ENCOUNTER — Ambulatory Visit: Payer: MEDICAID | Admitting: Physician Assistant

## 2023-04-20 ENCOUNTER — Encounter: Payer: Self-pay | Admitting: Physician Assistant

## 2023-04-20 VITALS — BP 125/81 | HR 65 | Ht 68.0 in | Wt 165.0 lb

## 2023-04-20 DIAGNOSIS — F3341 Major depressive disorder, recurrent, in partial remission: Secondary | ICD-10-CM

## 2023-04-20 DIAGNOSIS — F1010 Alcohol abuse, uncomplicated: Secondary | ICD-10-CM

## 2023-04-20 DIAGNOSIS — F411 Generalized anxiety disorder: Secondary | ICD-10-CM

## 2023-04-20 DIAGNOSIS — J302 Other seasonal allergic rhinitis: Secondary | ICD-10-CM

## 2023-04-20 DIAGNOSIS — R0602 Shortness of breath: Secondary | ICD-10-CM

## 2023-04-20 MED ORDER — CETIRIZINE HCL 10 MG PO TABS: 10.0000 mg | ORAL_TABLET | Freq: Every day | ORAL | 1 refills | Status: DC

## 2023-04-20 MED ORDER — ALBUTEROL SULFATE HFA 108 (90 BASE) MCG/ACT IN AERS: 2.0000 | INHALATION_SPRAY | Freq: Four times a day (QID) | RESPIRATORY_TRACT | 0 refills | Status: DC | PRN

## 2023-04-20 NOTE — Progress Notes (Signed)
New Patient Office Visit  Subjective    Patient ID: Gary Mclaughlin, male    DOB: [birth year]  Age: 52 y.o. MRN: [id number]  CC:  Chief Complaint  Patient presents with   Medication Refill    Inhaler , recently stopped smoking. Experiencing SOB    URI   Anxiety   Insomnia    HPI Gary Mclaughlin states that he is currently being treated for substance abuse at [facility] recovery center, states that he arrived last week, does plan on going to [facility] in [facility] after he finishes his program.  States that he stopped smoking 2 weeks ago after a 37-pack-year history.  States that he has been having a productive cough with clear sputum, states the cough is worse at night, states that he also experiences wheezing and some shortness of breath at night.  States that he does not have any other upper respiratory symptoms.  States that he has not been trying anything for relief.    States that he previously has been given an albuterol inhaler to use, states that he has not had one available, does endorse unsheltered homelessness prior to arriving at [facility]   States that he has been experiencing elevated episodes of anxiety, states that he has been using hydroxyzine without relief.  States that he does plan on following up with psychiatry at [facility]. Outpatient Encounter Medications as of 04/20/2023  Medication Sig   albuterol (VENTOLIN HFA) 108 (90 Base) MCG/ACT inhaler Inhale 2 puffs into the lungs every 6 (six) hours as needed for wheezing or shortness of breath.   cetirizine (ZYRTEC ALLERGY) 10 MG tablet Take 1 tablet (10 mg total) by mouth daily.   clotrimazole (LOTRIMIN) 1 % cream Apply topically 2 (two) times daily.   gabapentin (NEURONTIN) 300 MG capsule Take 1 capsule (300 mg total) by mouth 2 (two) times daily.   hydrOXYzine (ATARAX) 25 MG tablet Take 1 tablet (25 mg total) by mouth 3 (three) times daily as needed for anxiety.   naltrexone (DEPADE) 50 MG tablet Take 1 tablet (50 mg  total) by mouth at bedtime.   nicotine (NICODERM CQ - DOSED IN MG/24 HOURS) 21 mg/24hr patch Place 1 patch (21 mg total) onto the skin daily.   traZODone (DESYREL) 50 MG tablet Take 1 tablet (50 mg total) by mouth at bedtime as needed for sleep.   No facility-administered encounter medications on file as of 04/20/2023.    Past Medical History:  Diagnosis Date   Depression    ETOH abuse    Hepatic steatosis 10/30/2021   Seizure (HCC)    Subarachnoid hemorrhage (HCC) 07/28/2017   Tobacco abuse 10/27/2021    Past Surgical History:  Procedure Laterality Date   ORIF PELVIC FRACTURE WITH PERCUTANEOUS SCREWS Left 04/17/2022   Procedure: SI SCREW AND ORIF SYMPHYSIS PELVIC RING;  Surgeon: Roby Lofts, MD;  Location: [facility] OR;  Service: Orthopedics;  Laterality: Left;    Family History  Problem Relation Age of Onset   Hypertension Other     Social History   Socioeconomic History   Marital status: Single    Spouse name: Not on file   Number of children: Not on file   Years of education: Not on file   Highest education level: Not on file  Occupational History   Not on file  Tobacco Use   Smoking status: Former    Current packs/day: 1.00    Average packs/day: 1 pack/day for 25.0 years (25.0 ttl pk-yrs)  Types: Cigarettes   Smokeless tobacco: Never   Tobacco comments:    Declined  Vaping Use   Vaping status: Never Used  Substance and Sexual Activity   Alcohol use: Yes   Drug use: No    Types: Marijuana    Comment: hx of mj use   Sexual activity: Not on file  Other Topics Concern   Not on file  Social History Narrative   ** Merged History Encounter **       Social Drivers of Health   Financial Resource Strain: Not on file  Food Insecurity: Food Insecurity Present (04/10/2023)   Hunger Vital Sign    Worried About Running Out of Food in the Last Year: Sometimes true    Ran Out of Food in the Last Year: Sometimes true  Transportation Needs: Unmet Transportation  Needs (04/10/2023)   PRAPARE - Administrator, Civil Service (Medical): Yes    Lack of Transportation (Non-Medical): Yes  Physical Activity: Not on file  Stress: Not on file  Social Connections: Not on file  Intimate Partner Violence: Not At Risk (04/10/2023)   Humiliation, Afraid, Rape, and Kick questionnaire    Fear of Current or Ex-Partner: No    Emotionally Abused: No    Physically Abused: No    Sexually Abused: No    Review of Systems  Constitutional:  Negative for chills and fever.  HENT:  Positive for congestion. Negative for sore throat.   Respiratory:  Positive for cough, sputum production, shortness of breath and wheezing.   Cardiovascular:  Negative for chest pain.  Gastrointestinal:  Negative for abdominal pain, nausea and vomiting.  Genitourinary: Negative.   Musculoskeletal:  Negative for myalgias.  Skin: Negative.   Neurological: Negative.   Endo/Heme/Allergies: Negative.   Psychiatric/Behavioral:  The patient is nervous/anxious.         Objective    BP 125/81 (BP Location: Left Arm, Patient Position: Sitting, Cuff Size: Large)   Pulse 65   Ht 5\' 8"  (1.727 m)   Wt 165 lb (74.8 kg)   SpO2 99%   BMI 25.09 kg/m   Physical Exam Vitals and nursing note reviewed.  Constitutional:      Appearance: Normal appearance.  HENT:     Head: Normocephalic and atraumatic.     Right Ear: External ear normal.     Left Ear: External ear normal.     Nose: Nose normal.     Mouth/Throat:     Mouth: Mucous membranes are moist.     Pharynx: Oropharynx is clear.  Eyes:     Extraocular Movements: Extraocular movements intact.     Conjunctiva/sclera: Conjunctivae normal.     Pupils: Pupils are equal, round, and reactive to light.  Cardiovascular:     Rate and Rhythm: Normal rate and regular rhythm.     Pulses: Normal pulses.     Heart sounds: Normal heart sounds.  Pulmonary:     Effort: Pulmonary effort is normal.     Breath sounds: Normal breath sounds. No  wheezing.  Musculoskeletal:        General: Normal range of motion.     Cervical back: Normal range of motion and neck supple.  Skin:    General: Skin is warm and dry.  Neurological:     General: No focal deficit present.     Mental Status: He is alert and oriented to person, place, and time.  Psychiatric:        Mood and Affect: Mood  normal.        Behavior: Behavior normal.        Thought Content: Thought content normal.        Judgment: Judgment normal.        Assessment & Plan:   Problem List Items Addressed This Visit       Other   Alcohol abuse - Primary   Other Visit Diagnoses       SOB (shortness of breath)       Relevant Medications   albuterol (VENTOLIN HFA) 108 (90 Base) MCG/ACT inhaler     Seasonal allergies       Relevant Medications   cetirizine (ZYRTEC ALLERGY) 10 MG tablet     GAD (generalized anxiety disorder)         Recurrent major depressive disorder, in partial remission (HCC)          1. SOB (shortness of breath) Resume albuterol.  Patient education given on supportive care, patient encouraged to keep log of use.  Red flags given for prompt reevaluation - albuterol (VENTOLIN HFA) 108 (90 Base) MCG/ACT inhaler; Inhale 2 puffs into the lungs every 6 (six) hours as needed for wheezing or shortness of breath.  Dispense: 8 g; Refill: 0  2. Seasonal allergies Trial Zyrtec.  Patient education given on lifestyle modifications - cetirizine (ZYRTEC ALLERGY) 10 MG tablet; Take 1 tablet (10 mg total) by mouth daily.  Dispense: 30 tablet; Refill: 1  3. GAD (generalized anxiety disorder) Continue follow-up with [facility] psychiatry  4. Recurrent major depressive disorder, in partial remission (HCC)   5. Alcohol abuse (Primary) Currently in substance abuse treatment program   I have reviewed the patient's medical history (PMH, PSH, Social History, Family History, Medications, and allergies) , and have been updated if relevant. I spent 30 minutes  reviewing chart and  face to face time with patient.    Return in about 2 weeks (around 05/04/2023) for With [facility].   Kasandra Knudsen Mayers, PA-C

## 2023-04-20 NOTE — Patient Instructions (Signed)
You are going to restart the albuterol inhaler, you will take 2 puffs every 6 hours as needed.  Please take note of how often you are having to use the inhaler.  You are also going to start taking Zyrtec 10 mg on a daily basis.  You will follow-up with the mobile unit in 2 weeks.  Gary Jaffe, PA-C Physician Assistant [facility] [email or web address]  Shortness of Breath, Adult Shortness of breath is when a person has trouble breathing or when a person feels like she or he is having trouble breathing in enough air. Shortness of breath could be a sign of a medical problem. Follow these instructions at home:  Pollutants Do not use any products that contain nicotine or tobacco. These products include cigarettes, chewing tobacco, and vaping devices, such as e-cigarettes. This also includes cigars and pipes. If you need help quitting, ask your health care provider. Avoid things that can irritate your airways, including: Smoke. This includes campfire smoke, forest fire smoke, and secondhand smoke from tobacco products. Do not smoke or allow others to smoke in your home. Mold. Dust. Air pollution. Chemical fumes. Things that can give you an allergic reaction (allergens) if you have allergies. Common allergens include pollen from grasses or trees and animal dander. Keep your living space clean and free of mold and dust. General instructions Pay attention to any changes in your symptoms. Take over-the-counter and prescription medicines only as told by your health care provider. This includes oxygen therapy and inhaled medicines. Rest as needed. Return to your normal activities as told by your health care provider. Ask your health care provider what activities are safe for you. Keep all follow-up visits. This is important. Contact a health care provider if: Your condition does not improve as soon as expected. You have a hard time doing  your normal activities, even after you rest. You have new symptoms. You cannot walk up stairs or exercise the way that you normally do. Get help right away if: Your shortness of breath gets worse. You have shortness of breath when you are resting. You feel light-headed or you faint. You have a cough that is not controlled with medicines. You cough up blood. You have pain with breathing. You have pain in your chest, arms, shoulders, or abdomen. You have a fever. These symptoms may be an emergency. Get help right away. Call 911. Do not wait to see if the symptoms will go away. Do not drive yourself to the hospital. Summary Shortness of breath is when a person has trouble breathing enough air. It can be a sign of a medical problem. Avoid things that irritate your lungs, such as smoking, pollution, mold, and dust. Pay attention to changes in your symptoms and contact your health care provider if you have a hard time completing daily activities because of shortness of breath. This information is not intended to replace advice given to you by your health care provider. Make sure you discuss any questions you have with your health care provider. Document Revised: 12/08/2020 Document Reviewed: 12/08/2020 Elsevier Patient Education  2024 [employer].

## 2023-07-02 ENCOUNTER — Emergency Department (HOSPITAL_COMMUNITY): Payer: MEDICAID

## 2023-07-02 ENCOUNTER — Encounter (HOSPITAL_COMMUNITY): Payer: Self-pay

## 2023-07-02 ENCOUNTER — Other Ambulatory Visit: Payer: Self-pay

## 2023-07-02 ENCOUNTER — Emergency Department (HOSPITAL_COMMUNITY)
Admission: EM | Admit: 2023-07-02 | Discharge: 2023-07-02 | Disposition: A | Discharge status: Home / Self Care | Payer: MEDICAID | Attending: Emergency Medicine | Admitting: Emergency Medicine

## 2023-07-02 DIAGNOSIS — Y908 Blood alcohol level of 240 mg/100 ml or more: Secondary | ICD-10-CM | POA: Diagnosis not present

## 2023-07-02 DIAGNOSIS — R55 Syncope and collapse: Secondary | ICD-10-CM | POA: Diagnosis present

## 2023-07-02 DIAGNOSIS — F10929 Alcohol use, unspecified with intoxication, unspecified: Secondary | ICD-10-CM

## 2023-07-02 DIAGNOSIS — F10129 Alcohol abuse with intoxication, unspecified: Secondary | ICD-10-CM | POA: Diagnosis not present

## 2023-07-02 DIAGNOSIS — Z5900 Homelessness unspecified: Secondary | ICD-10-CM | POA: Insufficient documentation

## 2023-07-02 DIAGNOSIS — Z79899 Other long term (current) drug therapy: Secondary | ICD-10-CM | POA: Insufficient documentation

## 2023-07-02 LAB — TROPONIN I (HIGH SENSITIVITY)
Troponin I (High Sensitivity): 6 ng/L (ref <18)
Troponin I (High Sensitivity): 5 ng/L (ref <18)

## 2023-07-02 LAB — CBC WITH DIFFERENTIAL/PLATELET
Abs Immature Granulocytes: 0.04 10*3/uL (ref 0.00–0.07)
Basophils Absolute: 0.1 10*3/uL (ref 0.0–0.1)
Basophils Relative: 1 %
Eosinophils Absolute: 0.1 10*3/uL (ref 0.0–0.5)
Eosinophils Relative: 2 %
HCT: 36.4 % — ABNORMAL LOW (ref 39.0–52.0)
Hemoglobin: 12.1 g/dL — ABNORMAL LOW (ref 13.0–17.0)
Immature Granulocytes: 1 %
Lymphocytes Relative: 23 %
Lymphs Abs: 2 10*3/uL (ref 0.7–4.0)
MCH: 32.8 pg (ref 26.0–34.0)
MCHC: 33.2 g/dL (ref 30.0–36.0)
MCV: 98.6 fL (ref 80.0–100.0)
Monocytes Absolute: 0.6 10*3/uL (ref 0.1–1.0)
Monocytes Relative: 7 %
Neutro Abs: 5.7 10*3/uL (ref 1.7–7.7)
Neutrophils Relative %: 66 %
Platelets: 339 10*3/uL (ref 150–400)
RBC: 3.69 MIL/uL — ABNORMAL LOW (ref 4.22–5.81)
RDW: 15.7 % — ABNORMAL HIGH (ref 11.5–15.5)
WBC: 8.7 10*3/uL (ref 4.0–10.5)
nRBC: 0 % (ref 0.0–0.2)

## 2023-07-02 LAB — BASIC METABOLIC PANEL
Anion gap: 13 (ref 5–15)
BUN: 13 mg/dL (ref 6–20)
CO2: 22 mmol/L (ref 22–32)
Calcium: 9.5 mg/dL (ref 8.9–10.3)
Chloride: 98 mmol/L (ref 98–111)
Creatinine, Ser: 0.78 mg/dL (ref 0.61–1.24)
GFR, Estimated: >60 mL/min (ref >60)
Glucose, Bld: 90 mg/dL (ref 70–99)
Potassium: 3.9 mmol/L (ref 3.5–5.1)
Sodium: 133 mmol/L — ABNORMAL LOW (ref 135–145)

## 2023-07-02 LAB — MAGNESIUM: Magnesium: 2 mg/dL (ref 1.7–2.4)

## 2023-07-02 LAB — SALICYLATE LEVEL: Salicylate Lvl: <7 mg/dL — ABNORMAL LOW (ref 7.0–30.0)

## 2023-07-02 LAB — ETHANOL: Alcohol, Ethyl (B): 477 mg/dL (ref <10)

## 2023-07-02 LAB — CBG MONITORING, ED: Glucose-Capillary: 94 mg/dL (ref 70–99)

## 2023-07-02 LAB — ACETAMINOPHEN LEVEL: Acetaminophen (Tylenol), Serum: <10 ug/mL — ABNORMAL LOW (ref 10–30)

## 2023-07-02 MED ORDER — SODIUM CHLORIDE 0.9 % IV BOLUS
1000.0000 mL | Freq: Once | INTRAVENOUS | Status: AC
  Administered 2023-07-02: 1000 mL via INTRAVENOUS
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
## 2023-07-02 NOTE — ED Notes (Signed)
 Back from CT

## 2023-07-02 NOTE — ED Notes (Signed)
 Walked into room. Patient had take all of his cords off and was getting dressed. Patient is able to ambulate without difficulty. Patient states he is hungry.
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
## 2023-07-04 ENCOUNTER — Emergency Department (HOSPITAL_COMMUNITY)
Admission: EM | Admit: 2023-07-04 | Discharge: 2023-07-04 | Disposition: A | Discharge status: Home / Self Care | Payer: MEDICAID | Attending: Emergency Medicine | Admitting: Emergency Medicine

## 2023-07-04 ENCOUNTER — Other Ambulatory Visit: Payer: Self-pay

## 2023-07-04 ENCOUNTER — Encounter (HOSPITAL_COMMUNITY): Payer: Self-pay

## 2023-07-04 DIAGNOSIS — F1090 Alcohol use, unspecified, uncomplicated: Secondary | ICD-10-CM | POA: Diagnosis present

## 2023-07-04 LAB — RAPID URINE DRUG SCREEN, HOSP PERFORMED
Amphetamines: NOT DETECTED
Barbiturates: NOT DETECTED
Benzodiazepines: NOT DETECTED
Cocaine: POSITIVE — AB
Opiates: NOT DETECTED
Tetrahydrocannabinol: NOT DETECTED

## 2023-07-04 NOTE — ED Notes (Signed)
 Pt asked for food, I told him after tests he could. He ran up to the nurses station and started eating and drinking from the trays on the counter.

## 2023-07-04 NOTE — Discharge Instructions (Signed)

## 2023-07-04 NOTE — ED Notes (Signed)
 Pt discharged with [place] sandwich and given paperwork. Ambulatory to lobby.

## 2023-07-04 NOTE — ED Notes (Signed)
 Pt informed that we would be getting blood work and that the doctor would review his results and then make decision on whether or not he would be admitted. Pt states "I need to be medically detoxed", pt asking for more food. Informed he would have to wait until doctor cleared him to eat.

## 2023-07-04 NOTE — ED Triage Notes (Signed)
 Pt BIB [facility] from street requesting alcohol detox. Pt reports drinking 1/5th of liquor today. Pt homeless. States "I'm tripping balls"

## 2023-07-04 NOTE — ED Provider Notes (Signed)
 [place] EMERGENCY DEPARTMENT AT [facility] Provider Note   CSN: [id number] Arrival date & time: 07/04/23  2117     History  Chief Complaint  Patient presents with   Alcohol Intoxication    Mikhael Hochstetler is a 53 y.o. male.  HPI    53 year old male comes to the emergency room with chief complaint of alcohol use disorder, and wanting detox.  Patient indicates that he wants medically detox.  He states that his last alcoholic beverage was today.  He has gone to [facility] in the past for detox, and wants the same again.  Thereafter, he states that if he were not going to give him detox resources then he will complain of suicidal ideation, as he would not want to go home, and would rather be admitted to the mental ward.  Home Medications Prior to Admission medications   Medication Sig Start Date End Date Taking? Authorizing Provider  albuterol (VENTOLIN HFA) 108 (90 Base) MCG/ACT inhaler Inhale 2 puffs into the lungs every 6 (six) hours as needed for wheezing or shortness of breath. 04/20/23   Mayers, Cari S, PA-C  cetirizine (ZYRTEC ALLERGY) 10 MG tablet Take 1 tablet (10 mg total) by mouth daily. 04/20/23   Mayers, Cari S, PA-C  clotrimazole (LOTRIMIN) 1 % cream Apply topically 2 (two) times daily. 04/13/23   Lance Muss, MD  gabapentin (NEURONTIN) 300 MG capsule Take 1 capsule (300 mg total) by mouth 2 (two) times daily. 04/13/23   Lance Muss, MD  hydrOXYzine (ATARAX) 25 MG tablet Take 1 tablet (25 mg total) by mouth 3 (three) times daily as needed for anxiety. 04/13/23   Lance Muss, MD  naltrexone (DEPADE) 50 MG tablet Take 1 tablet (50 mg total) by mouth at bedtime. 04/13/23   Lance Muss, MD  nicotine (NICODERM CQ - DOSED IN MG/24 HOURS) 21 mg/24hr patch Place 1 patch (21 mg total) onto the skin daily. 04/14/23   Lance Muss, MD  traZODone (DESYREL) 50 MG tablet Take 1 tablet (50 mg total) by mouth at bedtime as needed for sleep. 04/13/23   Lance Muss, MD      Allergies    Patient has no known allergies.    Review of Systems   Review of Systems  All other systems reviewed and are negative.   Physical Exam Updated Vital Signs BP 117/84 (BP Location: Left Arm)   Pulse 82   Temp 97.6 F (36.4 C) (Oral)   Resp 17   Ht 5\' 8"  (1.727 m)   Wt 74.8 kg   SpO2 94%   BMI 25.09 kg/m  Physical Exam Vitals and nursing note reviewed.  Constitutional:      Appearance: He is well-developed.  HENT:     Head: Atraumatic.  Cardiovascular:     Rate and Rhythm: Normal rate.  Pulmonary:     Effort: Pulmonary effort is normal.  Musculoskeletal:     Cervical back: Neck supple.  Skin:    General: Skin is warm.  Neurological:     Mental Status: He is alert and oriented to person, place, and time.     ED Results / Procedures / Treatments   Labs (all labs ordered are listed, but only abnormal results are displayed) Labs Reviewed  COMPREHENSIVE METABOLIC PANEL  ETHANOL  CBC  RAPID URINE DRUG SCREEN, HOSP PERFORMED    EKG None  Radiology No results found.  Procedures Procedures    Medications Ordered in  ED Medications - No data to display  ED Course/ Medical Decision Making/ A&P                                 Medical Decision Making Amount and/or Complexity of Data Reviewed Labs: ordered.  53 year old male comes in with chief complaint of detox. Social determinant of health: Homelessness.  Patient has history of alcohol use disorder and COPD.  He came to the ER primarily complaining of alcohol intoxication, and also wanting detox.  Nursing staff informed me that patient has been badgering them is in the hallway, constantly asking for food and drinks.  On my assessment, patient is AO x 3 and is able to ambulate without any problems.  He has no medical complaints.  His last alcoholic beverage was prior to ED arrival.  He is not having any withdrawal symptoms right now.  Differential diagnosis for this  patient includes more so alcohol intoxication, malingering, homelessness, alcohol use disorder.  When we were discussing patient's alcohol use disorder, patient unprovoked indicated that if we were to discharge him, then he was also going to state that he was suicidal and wants to be admitted to mental ward, as he does not want to go home.  It appears clinically to me that patient is seeking secondary gain.  I have reviewed patient's record.  He has progress note from 12-16 indicating that patient was enrolled at [facility].  I specifically asked patient about [facility], which is when he stated that he also is suicidal.  From my clinical judgment, there is secondary gain at this time that is driving this ED visit.  We will discharge the patient and give him outpatient resources.  Informed him that he can seek detox by going to [facility] health behavioral urgent care also going to [facility] facility.  Patient is clinically sober. He is talking coherently, gait is normal, and is demonstrating rational thought process. We shall discharge him shortly, and we have discussed the warning signs of alcohol withdrawal with him verbally.    Final Clinical Impression(s) / ED Diagnoses Final diagnoses:  Alcohol use disorder    Rx / DC Orders ED Discharge Orders     None         Derwood Kaplan, MD 07/04/23 2309

## 2023-07-30 ENCOUNTER — Encounter (HOSPITAL_COMMUNITY): Payer: Self-pay

## 2023-07-30 ENCOUNTER — Other Ambulatory Visit: Payer: Self-pay

## 2023-07-30 ENCOUNTER — Emergency Department (HOSPITAL_COMMUNITY)
Admission: EM | Admit: 2023-07-30 | Discharge: 2023-07-31 | Discharge status: Against Medical Advice | Payer: MEDICAID | Attending: Emergency Medicine | Admitting: Emergency Medicine

## 2023-07-30 DIAGNOSIS — F10120 Alcohol abuse with intoxication, uncomplicated: Secondary | ICD-10-CM | POA: Insufficient documentation

## 2023-07-30 DIAGNOSIS — Z5321 Procedure and treatment not carried out due to patient leaving prior to being seen by health care provider: Secondary | ICD-10-CM | POA: Diagnosis not present

## 2023-07-30 DIAGNOSIS — Y908 Blood alcohol level of 240 mg/100 ml or more: Secondary | ICD-10-CM | POA: Diagnosis not present

## 2023-07-30 LAB — COMPREHENSIVE METABOLIC PANEL WITH GFR
ALT: 54 U/L — ABNORMAL HIGH (ref 0–44)
AST: 86 U/L — ABNORMAL HIGH (ref 15–41)
Albumin: 3.8 g/dL (ref 3.5–5.0)
Alkaline Phosphatase: 86 U/L (ref 38–126)
Anion gap: 13 (ref 5–15)
BUN: 8 mg/dL (ref 6–20)
CO2: 21 mmol/L — ABNORMAL LOW (ref 22–32)
Calcium: 9.2 mg/dL (ref 8.9–10.3)
Chloride: 97 mmol/L — ABNORMAL LOW (ref 98–111)
Creatinine, Ser: 0.73 mg/dL (ref 0.61–1.24)
GFR, Estimated: >60 mL/min (ref >60)
Glucose, Bld: 86 mg/dL (ref 70–99)
Potassium: 4.2 mmol/L (ref 3.5–5.1)
Sodium: 131 mmol/L — ABNORMAL LOW (ref 135–145)
Total Bilirubin: 0.5 mg/dL (ref 0.0–1.2)
Total Protein: 7.5 g/dL (ref 6.5–8.1)

## 2023-07-30 LAB — CBC
HCT: 35.5 % — ABNORMAL LOW (ref 39.0–52.0)
Hemoglobin: 12.1 g/dL — ABNORMAL LOW (ref 13.0–17.0)
MCH: 32.4 pg (ref 26.0–34.0)
MCHC: 34.1 g/dL (ref 30.0–36.0)
MCV: 95.2 fL (ref 80.0–100.0)
Platelets: 231 10*3/uL (ref 150–400)
RBC: 3.73 MIL/uL — ABNORMAL LOW (ref 4.22–5.81)
RDW: 15.4 % (ref 11.5–15.5)
WBC: 4.4 10*3/uL (ref 4.0–10.5)
nRBC: 0 % (ref 0.0–0.2)

## 2023-07-30 LAB — ETHANOL: Alcohol, Ethyl (B): 406 mg/dL (ref <10)

## 2023-07-30 NOTE — ED Triage Notes (Signed)
 Pt BIB [facility] for alcohol intoxication and is asking for assistance with detox . Pt also reports that he is homeless and is hungry. Pt drinks an average of 4- 40oz beers daily and last drank tonight prior to coming. Pt denies pain and/or any other symptoms

## 2023-07-30 NOTE — ED Notes (Signed)
 Pt unable to urinate at this time but aware that we need a specimen. Pt also provided with food and drink prior to going to lobby

## 2023-07-30 NOTE — ED Provider Triage Note (Signed)
 Emergency Medicine Provider Triage Evaluation Note  Gary Mclaughlin , a 53 y.o. male  was evaluated in triage.  Pt complains of alcohol intoxication and is requesting assistance with detox.  Patient also reports being homeless and hungry.  He states he drinks an average of 4 40 ounce beers daily and drink tonight prior to coming to the emergency department.  He denies any other symptoms at this time  Review of Systems  Positive:  Negative:   Physical Exam  BP 113/77   Pulse 75   Temp 98.4 F (36.9 C) (Oral)   Resp 15   SpO2 95%  Gen:   Awake, no distress   Resp:  Normal effort  MSK:   Moves extremities without difficulty  Other:    Medical Decision Making  Medically screening exam initiated at 11:15 PM.  Appropriate orders placed.  Gary Mclaughlin was informed that the remainder of the evaluation will be completed by another provider, this initial triage assessment does not replace that evaluation, and the importance of remaining in the ED until their evaluation is complete.     Darrick Grinder, PA-C 07/30/23 2316

## 2023-07-31 NOTE — ED Notes (Signed)
 Pt seen walking out the lobby over an hr ago and still have not returned.

## 2023-08-05 ENCOUNTER — Encounter (HOSPITAL_COMMUNITY): Payer: Self-pay | Admitting: Emergency Medicine

## 2023-08-05 ENCOUNTER — Other Ambulatory Visit: Payer: Self-pay

## 2023-08-05 ENCOUNTER — Emergency Department (HOSPITAL_COMMUNITY)
Admission: EM | Admit: 2023-08-05 | Discharge: 2023-08-06 | Disposition: A | Discharge status: Home / Self Care | Payer: MEDICAID | Attending: Emergency Medicine | Admitting: Emergency Medicine

## 2023-08-05 DIAGNOSIS — Z79899 Other long term (current) drug therapy: Secondary | ICD-10-CM | POA: Diagnosis not present

## 2023-08-05 DIAGNOSIS — F10920 Alcohol use, unspecified with intoxication, uncomplicated: Secondary | ICD-10-CM | POA: Insufficient documentation

## 2023-08-05 DIAGNOSIS — Y908 Blood alcohol level of 240 mg/100 ml or more: Secondary | ICD-10-CM | POA: Diagnosis not present

## 2023-08-05 NOTE — ED Notes (Signed)
 Patient witnessed getting up and lying himself on the floor in the lobby, covering himself up with a blanket.

## 2023-08-05 NOTE — ED Triage Notes (Addendum)
 Patient BIB [facility] from the street with [employer], c/o being drunk.  Patient wants detox. Patient reports drinking 3 40 oz beers today and has not eaten today. During triage, patient states "I'm hungry."  Patient given a sandwich and two blankets, patient very thankful.

## 2023-08-06 ENCOUNTER — Emergency Department (HOSPITAL_COMMUNITY): Payer: MEDICAID

## 2023-08-06 LAB — CBC WITH DIFFERENTIAL/PLATELET
Abs Immature Granulocytes: 0.01 10*3/uL (ref 0.00–0.07)
Basophils Absolute: 0.1 10*3/uL (ref 0.0–0.1)
Basophils Relative: 1 %
Eosinophils Absolute: 0.2 10*3/uL (ref 0.0–0.5)
Eosinophils Relative: 5 %
HCT: 39.8 % (ref 39.0–52.0)
Hemoglobin: 13.5 g/dL (ref 13.0–17.0)
Immature Granulocytes: 0 %
Lymphocytes Relative: 23 %
Lymphs Abs: 1 10*3/uL (ref 0.7–4.0)
MCH: 32.5 pg (ref 26.0–34.0)
MCHC: 33.9 g/dL (ref 30.0–36.0)
MCV: 95.9 fL (ref 80.0–100.0)
Monocytes Absolute: 0.3 10*3/uL (ref 0.1–1.0)
Monocytes Relative: 8 %
Neutro Abs: 2.6 10*3/uL (ref 1.7–7.7)
Neutrophils Relative %: 63 %
Platelets: 150 10*3/uL (ref 150–400)
RBC: 4.15 MIL/uL — ABNORMAL LOW (ref 4.22–5.81)
RDW: 15.8 % — ABNORMAL HIGH (ref 11.5–15.5)
WBC: 4.2 10*3/uL (ref 4.0–10.5)
nRBC: 0 % (ref 0.0–0.2)

## 2023-08-06 LAB — URINALYSIS, ROUTINE W REFLEX MICROSCOPIC
Bilirubin Urine: NEGATIVE
Glucose, UA: NEGATIVE mg/dL
Ketones, ur: NEGATIVE mg/dL
Leukocytes,Ua: NEGATIVE
Nitrite: NEGATIVE
Protein, ur: 100 mg/dL — AB
Specific Gravity, Urine: 1.011 (ref 1.005–1.030)
pH: 5 (ref 5.0–8.0)

## 2023-08-06 LAB — COMPREHENSIVE METABOLIC PANEL WITH GFR
ALT: 81 U/L — ABNORMAL HIGH (ref 0–44)
AST: 134 U/L — ABNORMAL HIGH (ref 15–41)
Albumin: 3.8 g/dL (ref 3.5–5.0)
Alkaline Phosphatase: 93 U/L (ref 38–126)
Anion gap: 11 (ref 5–15)
BUN: 13 mg/dL (ref 6–20)
CO2: 24 mmol/L (ref 22–32)
Calcium: 9.4 mg/dL (ref 8.9–10.3)
Chloride: 100 mmol/L (ref 98–111)
Creatinine, Ser: 0.78 mg/dL (ref 0.61–1.24)
GFR, Estimated: >60 mL/min (ref >60)
Glucose, Bld: 77 mg/dL (ref 70–99)
Potassium: 4.5 mmol/L (ref 3.5–5.1)
Sodium: 135 mmol/L (ref 135–145)
Total Bilirubin: 0.5 mg/dL (ref 0.0–1.2)
Total Protein: 7.9 g/dL (ref 6.5–8.1)

## 2023-08-06 LAB — SALICYLATE LEVEL: Salicylate Lvl: <7 mg/dL — ABNORMAL LOW (ref 7.0–30.0)

## 2023-08-06 LAB — RAPID URINE DRUG SCREEN, HOSP PERFORMED
Amphetamines: NOT DETECTED
Barbiturates: NOT DETECTED
Benzodiazepines: NOT DETECTED
Cocaine: NOT DETECTED
Opiates: NOT DETECTED
Tetrahydrocannabinol: NOT DETECTED

## 2023-08-06 LAB — ACETAMINOPHEN LEVEL: Acetaminophen (Tylenol), Serum: <10 ug/mL — ABNORMAL LOW (ref 10–30)

## 2023-08-06 LAB — LIPASE, BLOOD: Lipase: 62 U/L — ABNORMAL HIGH (ref 11–51)

## 2023-08-06 LAB — ETHANOL: Alcohol, Ethyl (B): 268 mg/dL — ABNORMAL HIGH (ref <10)

## 2023-08-06 MED ORDER — THIAMINE HCL 100 MG/ML IJ SOLN: 100.0000 mg | Freq: Every day | INTRAMUSCULAR | Status: DC

## 2023-08-06 MED ORDER — ALBUTEROL SULFATE HFA 108 (90 BASE) MCG/ACT IN AERS
2.0000 | INHALATION_SPRAY | Freq: Once | RESPIRATORY_TRACT | Status: AC
  Administered 2023-08-06: 2 via RESPIRATORY_TRACT
  Filled 2023-08-06: qty 6.7

## 2023-08-06 MED ORDER — LORAZEPAM 1 MG PO TABS: 1.0000 mg | ORAL_TABLET | ORAL | Status: DC | PRN

## 2023-08-06 MED ORDER — FOLIC ACID 1 MG PO TABS: 1.0000 mg | ORAL_TABLET | Freq: Every day | ORAL | Status: DC

## 2023-08-06 MED ORDER — THIAMINE MONONITRATE 100 MG PO TABS: 100.0000 mg | ORAL_TABLET | Freq: Every day | ORAL | Status: DC

## 2023-08-06 MED ORDER — LORAZEPAM 1 MG PO TABS: 0.0000 mg | ORAL_TABLET | Freq: Two times a day (BID) | ORAL | Status: DC

## 2023-08-06 MED ORDER — LORAZEPAM 1 MG PO TABS
0.0000 mg | ORAL_TABLET | Freq: Four times a day (QID) | ORAL | Status: DC
Start: 2023-08-06 — End: 2023-08-06

## 2023-08-06 MED ORDER — ADULT MULTIVITAMIN W/MINERALS CH: 1.0000 | ORAL_TABLET | Freq: Every day | ORAL | Status: DC

## 2023-08-06 MED ORDER — LORAZEPAM 2 MG/ML IJ SOLN: 1.0000 mg | INTRAMUSCULAR | Status: DC | PRN

## 2023-08-06 NOTE — Discharge Instructions (Signed)
 Follow-up with the resources provided to undergo detox.  Return to the ED with new or worsening symptoms.

## 2023-08-06 NOTE — ED Provider Notes (Signed)
 Care assumed from Dr. Rhunette Croft.  Patient brought in from the street with alcohol intoxication.  Awaiting sobriety.  Stable vitals.  No distress.  Labs show alcohol intoxication.  No evidence of life-threatening alcohol withdrawal.  He is not suicidal or homicidal.  Did have brief hypoxia which improved with nasal cannula.  Chest x-ray shows no infiltrate.  Patient wants to stop drinking.  He is agreeable to try Librium.  Discussed that he cannot drink while taking Librium.  Able to ambulate without difficulty.  Appears stable for discharge.   Glynn Octave, MD 08/06/23 [phone]
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
## 2023-08-06 NOTE — ED Notes (Signed)
 Pt ambulates without any difficulty, maintaining steady and equal gait, requiring no staff assistance.
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
## 2023-08-27 ENCOUNTER — Other Ambulatory Visit: Payer: Self-pay

## 2023-08-27 ENCOUNTER — Emergency Department (HOSPITAL_COMMUNITY): Payer: MEDICAID

## 2023-08-27 ENCOUNTER — Emergency Department (HOSPITAL_COMMUNITY)
Admission: EM | Admit: 2023-08-27 | Discharge: 2023-08-28 | Disposition: A | Discharge status: Other Acute Inpatient Hospital | Payer: MEDICAID | Attending: Emergency Medicine | Admitting: Emergency Medicine

## 2023-08-27 DIAGNOSIS — T24202A Burn of second degree of unspecified site of left lower limb, except ankle and foot, initial encounter: Secondary | ICD-10-CM | POA: Insufficient documentation

## 2023-08-27 DIAGNOSIS — E871 Hypo-osmolality and hyponatremia: Secondary | ICD-10-CM | POA: Insufficient documentation

## 2023-08-27 DIAGNOSIS — X010XXA Exposure to flames in uncontrolled fire, not in building or structure, initial encounter: Secondary | ICD-10-CM | POA: Insufficient documentation

## 2023-08-27 DIAGNOSIS — T24201A Burn of second degree of unspecified site of right lower limb, except ankle and foot, initial encounter: Secondary | ICD-10-CM | POA: Diagnosis not present

## 2023-08-27 DIAGNOSIS — T2230XA Burn of third degree of shoulder and upper limb, except wrist and hand, unspecified site, initial encounter: Secondary | ICD-10-CM | POA: Insufficient documentation

## 2023-08-27 DIAGNOSIS — T2134XA Burn of third degree of lower back, initial encounter: Secondary | ICD-10-CM | POA: Diagnosis not present

## 2023-08-27 DIAGNOSIS — R7401 Elevation of levels of liver transaminase levels: Secondary | ICD-10-CM | POA: Diagnosis not present

## 2023-08-27 DIAGNOSIS — R780 Finding of alcohol in blood: Secondary | ICD-10-CM | POA: Diagnosis not present

## 2023-08-27 DIAGNOSIS — Z23 Encounter for immunization: Secondary | ICD-10-CM | POA: Diagnosis not present

## 2023-08-27 DIAGNOSIS — T2125XA Burn of second degree of buttock, initial encounter: Secondary | ICD-10-CM | POA: Diagnosis not present

## 2023-08-27 DIAGNOSIS — T2124XA Burn of second degree of lower back, initial encounter: Secondary | ICD-10-CM | POA: Diagnosis not present

## 2023-08-27 DIAGNOSIS — T22211A Burn of second degree of right forearm, initial encounter: Secondary | ICD-10-CM | POA: Insufficient documentation

## 2023-08-27 DIAGNOSIS — T2104XA Burn of unspecified degree of lower back, initial encounter: Secondary | ICD-10-CM | POA: Diagnosis present

## 2023-08-27 DIAGNOSIS — J9601 Acute respiratory failure with hypoxia: Secondary | ICD-10-CM

## 2023-08-27 DIAGNOSIS — T22212A Burn of second degree of left forearm, initial encounter: Secondary | ICD-10-CM | POA: Insufficient documentation

## 2023-08-27 DIAGNOSIS — T2135XA Burn of third degree of buttock, initial encounter: Secondary | ICD-10-CM | POA: Diagnosis not present

## 2023-08-27 DIAGNOSIS — Y908 Blood alcohol level of 240 mg/100 ml or more: Secondary | ICD-10-CM | POA: Diagnosis not present

## 2023-08-27 DIAGNOSIS — T300 Burn of unspecified body region, unspecified degree: Secondary | ICD-10-CM

## 2023-08-27 DIAGNOSIS — T3142 Burns involving 40-49% of body surface with 20-29% third degree burns: Secondary | ICD-10-CM | POA: Insufficient documentation

## 2023-08-27 LAB — I-STAT CHEM 8, ED
BUN: 31 mg/dL — ABNORMAL HIGH (ref 6–20)
Calcium, Ion: 0.98 mmol/L — ABNORMAL LOW (ref 1.15–1.40)
Chloride: 97 mmol/L — ABNORMAL LOW (ref 98–111)
Creatinine, Ser: 1.4 mg/dL — ABNORMAL HIGH (ref 0.61–1.24)
Glucose, Bld: 126 mg/dL — ABNORMAL HIGH (ref 70–99)
HCT: 42 % (ref 39.0–52.0)
Hemoglobin: 14.3 g/dL (ref 13.0–17.0)
Potassium: 5.7 mmol/L — ABNORMAL HIGH (ref 3.5–5.1)
Sodium: 129 mmol/L — ABNORMAL LOW (ref 135–145)
TCO2: 25 mmol/L (ref 22–32)

## 2023-08-27 LAB — COMPREHENSIVE METABOLIC PANEL WITH GFR
ALT: 98 U/L — ABNORMAL HIGH (ref 0–44)
AST: 264 U/L — ABNORMAL HIGH (ref 15–41)
Albumin: 3.6 g/dL (ref 3.5–5.0)
Alkaline Phosphatase: 92 U/L (ref 38–126)
Anion gap: 12 (ref 5–15)
BUN: 21 mg/dL — ABNORMAL HIGH (ref 6–20)
CO2: 23 mmol/L (ref 22–32)
Calcium: 9.4 mg/dL (ref 8.9–10.3)
Chloride: 96 mmol/L — ABNORMAL LOW (ref 98–111)
Creatinine, Ser: 0.89 mg/dL (ref 0.61–1.24)
GFR, Estimated: >60 mL/min (ref >60)
Glucose, Bld: 127 mg/dL — ABNORMAL HIGH (ref 70–99)
Potassium: 3.9 mmol/L (ref 3.5–5.1)
Sodium: 131 mmol/L — ABNORMAL LOW (ref 135–145)
Total Bilirubin: 0.7 mg/dL (ref 0.0–1.2)
Total Protein: 7.6 g/dL (ref 6.5–8.1)

## 2023-08-27 LAB — I-STAT CG4 LACTIC ACID, ED: Lactic Acid, Venous: 2.2 mmol/L (ref 0.5–1.9)

## 2023-08-27 LAB — CBC
HCT: 36.5 % — ABNORMAL LOW (ref 39.0–52.0)
Hemoglobin: 12.8 g/dL — ABNORMAL LOW (ref 13.0–17.0)
MCH: 32.8 pg (ref 26.0–34.0)
MCHC: 35.1 g/dL (ref 30.0–36.0)
MCV: 93.6 fL (ref 80.0–100.0)
Platelets: 262 10*3/uL (ref 150–400)
RBC: 3.9 MIL/uL — ABNORMAL LOW (ref 4.22–5.81)
RDW: 14.6 % (ref 11.5–15.5)
WBC: 6.6 10*3/uL (ref 4.0–10.5)
nRBC: 0 % (ref 0.0–0.2)

## 2023-08-27 LAB — I-STAT ARTERIAL BLOOD GAS, ED
Acid-base deficit: 3 mmol/L — ABNORMAL HIGH (ref 0.0–2.0)
Bicarbonate: 24.1 mmol/L (ref 20.0–28.0)
Calcium, Ion: 1.18 mmol/L (ref 1.15–1.40)
HCT: 35 % — ABNORMAL LOW (ref 39.0–52.0)
Hemoglobin: 11.9 g/dL — ABNORMAL LOW (ref 13.0–17.0)
O2 Saturation: 100 %
Patient temperature: 35.7
Potassium: 3.5 mmol/L (ref 3.5–5.1)
Sodium: 132 mmol/L — ABNORMAL LOW (ref 135–145)
TCO2: 26 mmol/L (ref 22–32)
pCO2 arterial: 48.2 mmHg — ABNORMAL HIGH (ref 32–48)
pH, Arterial: 7.301 — ABNORMAL LOW (ref 7.35–7.45)
pO2, Arterial: 613 mmHg — ABNORMAL HIGH (ref 83–108)

## 2023-08-27 LAB — SAMPLE TO BLOOD BANK

## 2023-08-27 LAB — PROTIME-INR
INR: 0.9 (ref 0.8–1.2)
Prothrombin Time: 12.5 s (ref 11.4–15.2)

## 2023-08-27 LAB — ETHANOL: Alcohol, Ethyl (B): 488 mg/dL (ref <15)

## 2023-08-27 MED ORDER — FENTANYL CITRATE PF 50 MCG/ML IJ SOSY
100.0000 ug | PREFILLED_SYRINGE | Freq: Once | INTRAMUSCULAR | Status: AC
  Administered 2023-08-27: 100 ug via INTRAVENOUS
  Filled 2023-08-27: qty 2

## 2023-08-27 MED ORDER — MIDAZOLAM HCL 2 MG/2ML IJ SOLN
2.0000 mg | Freq: Once | INTRAMUSCULAR | Status: DC
  Filled 2023-08-27: qty 2

## 2023-08-27 MED ORDER — CEFAZOLIN SODIUM-DEXTROSE 2-4 GM/100ML-% IV SOLN
2.0000 g | Freq: Once | INTRAVENOUS | Status: AC
  Administered 2023-08-27: 2 g via INTRAVENOUS

## 2023-08-27 MED ORDER — LACTATED RINGERS IV BOLUS
1000.0000 mL | Freq: Once | INTRAVENOUS | Status: AC
  Administered 2023-08-27: 1000 mL via INTRAVENOUS

## 2023-08-27 MED ORDER — FENTANYL BOLUS VIA INFUSION
50.0000 ug | INTRAVENOUS | Status: DC | PRN
  Administered 2023-08-27: 100 ug via INTRAVENOUS

## 2023-08-27 MED ORDER — TETANUS-DIPHTH-ACELL PERTUSSIS 5-2.5-18.5 LF-MCG/0.5 IM SUSY
0.5000 mL | PREFILLED_SYRINGE | Freq: Once | INTRAMUSCULAR | Status: AC
  Administered 2023-08-27: 0.5 mL via INTRAMUSCULAR

## 2023-08-27 MED ORDER — MIDAZOLAM HCL 2 MG/2ML IJ SOLN: 1.0000 mg | INTRAMUSCULAR | Status: DC | PRN

## 2023-08-27 MED ORDER — ROCURONIUM BROMIDE 10 MG/ML (PF) SYRINGE
PREFILLED_SYRINGE | INTRAVENOUS | Status: DC | PRN
  Administered 2023-08-27: 100 mg via INTRAVENOUS

## 2023-08-27 MED ORDER — ETOMIDATE 2 MG/ML IV SOLN
INTRAVENOUS | Status: DC | PRN
  Administered 2023-08-27: 20 mg via INTRAVENOUS

## 2023-08-27 MED ORDER — FENTANYL CITRATE PF 50 MCG/ML IJ SOSY
50.0000 ug | PREFILLED_SYRINGE | Freq: Once | INTRAMUSCULAR | Status: AC
  Administered 2023-08-27: 50 ug via INTRAVENOUS

## 2023-08-27 MED ORDER — PROPOFOL 1000 MG/100ML IV EMUL
5.0000 ug/kg/min | INTRAVENOUS | Status: DC
  Administered 2023-08-27: 5 ug/kg/min via INTRAVENOUS

## 2023-08-27 MED ORDER — MIDAZOLAM HCL 5 MG/5ML IJ SOLN
INTRAMUSCULAR | Status: DC | PRN
  Administered 2023-08-27: 2 mg via INTRAVENOUS

## 2023-08-27 MED ORDER — FENTANYL 2500MCG IN NS 250ML (10MCG/ML) PREMIX INFUSION
50.0000 ug/h | INTRAVENOUS | Status: DC
  Administered 2023-08-27: 100 ug/h via INTRAVENOUS
  Filled 2023-08-27: qty 250
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
## 2023-08-27 NOTE — Progress Notes (Signed)
   08/27/23 2307  Airway 7.5 mm  Placement Date/Time: 08/27/23 2218   Placed By: ED Physician  Airway Device: Endotracheal Tube  Laryngoscope Blade: 4  ETT Types: Oral  Size (mm): 7.5 mm  Cuffed: Cuffed  Insertion attempts: 1  Airway Equipment: Stylet;Video Laryngoscope  Placement Co...  Secured at (cm) 25 cm  Measured From Lips  Secured Location Center  Secured By English as a second language teacher No  Prone position No  Cuff Pressure (cm H2O) Clear OR 27-39 CmH2O  Site Condition Dry   ETT withdrawn from 27 to 25 per MD.
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
## 2023-09-30 ENCOUNTER — Ambulatory Visit (HOSPITAL_COMMUNITY)
Admission: EM | Admit: 2023-09-30 | Discharge: 2023-09-30 | Disposition: A | Discharge status: Home / Self Care | Payer: MEDICAID | Attending: Internal Medicine | Admitting: Internal Medicine

## 2023-09-30 ENCOUNTER — Encounter (HOSPITAL_COMMUNITY): Payer: Self-pay

## 2023-09-30 DIAGNOSIS — R0602 Shortness of breath: Secondary | ICD-10-CM

## 2023-09-30 DIAGNOSIS — F419 Anxiety disorder, unspecified: Secondary | ICD-10-CM | POA: Diagnosis not present

## 2023-09-30 DIAGNOSIS — L929 Granulomatous disorder of the skin and subcutaneous tissue, unspecified: Secondary | ICD-10-CM | POA: Diagnosis not present

## 2023-09-30 DIAGNOSIS — F1011 Alcohol abuse, in remission: Secondary | ICD-10-CM | POA: Diagnosis not present

## 2023-09-30 DIAGNOSIS — Z945 Skin transplant status: Secondary | ICD-10-CM | POA: Diagnosis not present

## 2023-09-30 MED ORDER — ALBUTEROL SULFATE HFA 108 (90 BASE) MCG/ACT IN AERS: 2.0000 | INHALATION_SPRAY | Freq: Four times a day (QID) | RESPIRATORY_TRACT | 2 refills | Status: DC | PRN

## 2023-09-30 MED ORDER — CHLORDIAZEPOXIDE HCL 25 MG PO CAPS: 25.0000 mg | ORAL_CAPSULE | Freq: Every day | ORAL | 1 refills | Status: DC

## 2023-09-30 MED ORDER — TRAMADOL HCL 50 MG PO TABS: 50.0000 mg | ORAL_TABLET | Freq: Four times a day (QID) | ORAL | 0 refills | Status: DC | PRN

## 2023-09-30 MED ORDER — HYDROXYZINE HCL 25 MG PO TABS: 25.0000 mg | ORAL_TABLET | Freq: Three times a day (TID) | ORAL | 2 refills | Status: DC | PRN
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
## 2023-10-06 ENCOUNTER — Other Ambulatory Visit: Payer: Self-pay

## 2023-10-06 ENCOUNTER — Encounter (HOSPITAL_COMMUNITY): Payer: Self-pay | Admitting: Emergency Medicine

## 2023-10-06 ENCOUNTER — Emergency Department (HOSPITAL_COMMUNITY)
Admission: EM | Admit: 2023-10-06 | Discharge: 2023-10-07 | Disposition: A | Discharge status: Home / Self Care | Payer: MEDICAID | Attending: Emergency Medicine | Admitting: Emergency Medicine

## 2023-10-06 ENCOUNTER — Emergency Department (HOSPITAL_COMMUNITY): Payer: MEDICAID

## 2023-10-06 DIAGNOSIS — Y929 Unspecified place or not applicable: Secondary | ICD-10-CM | POA: Diagnosis not present

## 2023-10-06 DIAGNOSIS — X58XXXD Exposure to other specified factors, subsequent encounter: Secondary | ICD-10-CM | POA: Insufficient documentation

## 2023-10-06 DIAGNOSIS — L03114 Cellulitis of left upper limb: Secondary | ICD-10-CM | POA: Diagnosis not present

## 2023-10-06 DIAGNOSIS — T300 Burn of unspecified body region, unspecified degree: Secondary | ICD-10-CM

## 2023-10-06 DIAGNOSIS — T2200XD Burn of unspecified degree of shoulder and upper limb, except wrist and hand, unspecified site, subsequent encounter: Secondary | ICD-10-CM | POA: Diagnosis present

## 2023-10-06 DIAGNOSIS — T2230XD Burn of third degree of shoulder and upper limb, except wrist and hand, unspecified site, subsequent encounter: Secondary | ICD-10-CM | POA: Diagnosis not present

## 2023-10-06 DIAGNOSIS — T2134XD Burn of third degree of lower back, subsequent encounter: Secondary | ICD-10-CM | POA: Diagnosis not present

## 2023-10-06 DIAGNOSIS — Z5900 Homelessness unspecified: Secondary | ICD-10-CM | POA: Diagnosis not present

## 2023-10-06 LAB — CBC WITH DIFFERENTIAL/PLATELET
Abs Immature Granulocytes: 0.04 10*3/uL (ref 0.00–0.07)
Basophils Absolute: 0.1 10*3/uL (ref 0.0–0.1)
Basophils Relative: 2 %
Eosinophils Absolute: 0.5 10*3/uL (ref 0.0–0.5)
Eosinophils Relative: 6 %
HCT: 28.2 % — ABNORMAL LOW (ref 39.0–52.0)
Hemoglobin: 8.9 g/dL — ABNORMAL LOW (ref 13.0–17.0)
Immature Granulocytes: 1 %
Lymphocytes Relative: 19 %
Lymphs Abs: 1.4 10*3/uL (ref 0.7–4.0)
MCH: 29.6 pg (ref 26.0–34.0)
MCHC: 31.6 g/dL (ref 30.0–36.0)
MCV: 93.7 fL (ref 80.0–100.0)
Monocytes Absolute: 0.7 10*3/uL (ref 0.1–1.0)
Monocytes Relative: 9 %
Neutro Abs: 4.9 10*3/uL (ref 1.7–7.7)
Neutrophils Relative %: 63 %
Platelets: 498 10*3/uL — ABNORMAL HIGH (ref 150–400)
RBC: 3.01 MIL/uL — ABNORMAL LOW (ref 4.22–5.81)
RDW: 14.9 % (ref 11.5–15.5)
WBC: 7.6 10*3/uL (ref 4.0–10.5)
nRBC: 0 % (ref 0.0–0.2)

## 2023-10-06 LAB — COMPREHENSIVE METABOLIC PANEL WITH GFR
ALT: 26 U/L (ref 0–44)
AST: 23 U/L (ref 15–41)
Albumin: 3.3 g/dL — ABNORMAL LOW (ref 3.5–5.0)
Alkaline Phosphatase: 57 U/L (ref 38–126)
Anion gap: 13 (ref 5–15)
BUN: 18 mg/dL (ref 6–20)
CO2: 22 mmol/L (ref 22–32)
Calcium: 10.2 mg/dL (ref 8.9–10.3)
Chloride: 101 mmol/L (ref 98–111)
Creatinine, Ser: 0.79 mg/dL (ref 0.61–1.24)
GFR, Estimated: >60 mL/min (ref >60)
Glucose, Bld: 93 mg/dL (ref 70–99)
Potassium: 4 mmol/L (ref 3.5–5.1)
Sodium: 136 mmol/L (ref 135–145)
Total Bilirubin: 0.3 mg/dL (ref 0.0–1.2)
Total Protein: 7.2 g/dL (ref 6.5–8.1)

## 2023-10-06 LAB — PROTIME-INR
INR: 1 (ref 0.8–1.2)
Prothrombin Time: 12.8 s (ref 11.4–15.2)

## 2023-10-06 LAB — I-STAT CG4 LACTIC ACID, ED: Lactic Acid, Venous: 1.1 mmol/L (ref 0.5–1.9)

## 2023-10-06 MED ORDER — BACITRACIN 500 UNIT/GM EX OINT
TOPICAL_OINTMENT | Freq: Two times a day (BID) | CUTANEOUS | Status: DC
  Administered 2023-10-06: 31.5 via TOPICAL
  Filled 2023-10-06: qty 1

## 2023-10-06 MED ORDER — BACITRACIN ZINC 500 UNIT/GM EX OINT: 1.0000 | TOPICAL_OINTMENT | Freq: Two times a day (BID) | CUTANEOUS | 0 refills | Status: DC

## 2023-10-06 MED ORDER — DOXYCYCLINE HYCLATE 100 MG PO TABS
100.0000 mg | ORAL_TABLET | Freq: Once | ORAL | Status: AC
  Administered 2023-10-06: 100 mg via ORAL
  Filled 2023-10-06: qty 1

## 2023-10-06 MED ORDER — DOXYCYCLINE HYCLATE 100 MG PO CAPS: 100.0000 mg | ORAL_CAPSULE | Freq: Two times a day (BID) | ORAL | 0 refills | Status: DC

## 2023-10-06 MED ORDER — OXYCODONE HCL 5 MG PO TABS
5.0000 mg | ORAL_TABLET | ORAL | Status: AC
  Administered 2023-10-06: 5 mg via ORAL
  Filled 2023-10-06 (×2): qty 1

## 2023-10-06 MED ORDER — OXYCODONE HCL 5 MG PO TABS: 5.0000 mg | ORAL_TABLET | ORAL | 0 refills | Status: DC | PRN
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
## 2023-10-07 NOTE — ED Notes (Signed)
 Wound care completed, wound rinsed with Vashe, bacitracin applied, covered with Xeroform and Gauze roll. Pt verbalized understanding on wound care and follow up. Meal given to pt.

## 2023-10-11 LAB — CULTURE, BLOOD (ROUTINE X 2)
Culture: NO GROWTH
Culture: NO GROWTH
Special Requests: ADEQUATE

## 2023-12-20 ENCOUNTER — Encounter (HOSPITAL_COMMUNITY): Payer: Self-pay

## 2023-12-20 ENCOUNTER — Emergency Department (HOSPITAL_COMMUNITY)
Admission: EM | Admit: 2023-12-20 | Discharge: 2023-12-20 | Discharge status: Against Medical Advice | Payer: MEDICAID | Attending: Emergency Medicine | Admitting: Emergency Medicine

## 2023-12-20 ENCOUNTER — Other Ambulatory Visit: Payer: Self-pay

## 2023-12-20 DIAGNOSIS — Y908 Blood alcohol level of 240 mg/100 ml or more: Secondary | ICD-10-CM | POA: Diagnosis not present

## 2023-12-20 DIAGNOSIS — F10920 Alcohol use, unspecified with intoxication, uncomplicated: Secondary | ICD-10-CM

## 2023-12-20 DIAGNOSIS — F10120 Alcohol abuse with intoxication, uncomplicated: Secondary | ICD-10-CM | POA: Insufficient documentation

## 2023-12-20 DIAGNOSIS — Z5900 Homelessness unspecified: Secondary | ICD-10-CM | POA: Insufficient documentation

## 2023-12-20 LAB — CBC WITH DIFFERENTIAL/PLATELET
Abs Immature Granulocytes: 0.02 K/uL (ref 0.00–0.07)
Basophils Absolute: 0.1 K/uL (ref 0.0–0.1)
Basophils Relative: 2 %
Eosinophils Absolute: 0.2 K/uL (ref 0.0–0.5)
Eosinophils Relative: 3 %
HCT: 45.1 % (ref 39.0–52.0)
Hemoglobin: 14.4 g/dL (ref 13.0–17.0)
Immature Granulocytes: 0 %
Lymphocytes Relative: 34 %
Lymphs Abs: 2 K/uL (ref 0.7–4.0)
MCH: 27.4 pg (ref 26.0–34.0)
MCHC: 31.9 g/dL (ref 30.0–36.0)
MCV: 85.9 fL (ref 80.0–100.0)
Monocytes Absolute: 0.3 K/uL (ref 0.1–1.0)
Monocytes Relative: 5 %
Neutro Abs: 3.3 K/uL (ref 1.7–7.7)
Neutrophils Relative %: 56 %
Platelets: 336 K/uL (ref 150–400)
RBC: 5.25 MIL/uL (ref 4.22–5.81)
RDW: 14.2 % (ref 11.5–15.5)
WBC: 5.9 K/uL (ref 4.0–10.5)
nRBC: 0 % (ref 0.0–0.2)

## 2023-12-20 LAB — COMPREHENSIVE METABOLIC PANEL WITH GFR
ALT: 52 U/L — ABNORMAL HIGH (ref 0–44)
AST: 77 U/L — ABNORMAL HIGH (ref 15–41)
Albumin: 4.2 g/dL (ref 3.5–5.0)
Alkaline Phosphatase: 88 U/L (ref 38–126)
Anion gap: 13 (ref 5–15)
BUN: 12 mg/dL (ref 6–20)
CO2: 22 mmol/L (ref 22–32)
Calcium: 9.7 mg/dL (ref 8.9–10.3)
Chloride: 107 mmol/L (ref 98–111)
Creatinine, Ser: 0.75 mg/dL (ref 0.61–1.24)
GFR, Estimated: >60 mL/min (ref >60)
Glucose, Bld: 75 mg/dL (ref 70–99)
Potassium: 4.2 mmol/L (ref 3.5–5.1)
Sodium: 142 mmol/L (ref 135–145)
Total Bilirubin: 0.4 mg/dL (ref 0.0–1.2)
Total Protein: 8 g/dL (ref 6.5–8.1)

## 2023-12-20 LAB — ETHANOL: Alcohol, Ethyl (B): 454 mg/dL (ref <15)

## 2023-12-20 NOTE — ED Notes (Signed)
 Pt provided lunch bag and drinks.

## 2023-12-20 NOTE — ED Triage Notes (Signed)
 BIB [facility] from [facility]  C/c alcohol  intoxication and is requesting detox, was found laying in a back parking lot up against a dumpster. Aox4, GCS 15.    112 74  HR 72 Spo2 92% RA BGL 97
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
## 2023-12-20 NOTE — ED Notes (Signed)
 Phlebotomist to collect labs.

## 2023-12-20 NOTE — ED Notes (Addendum)
 Pt eloped out through EMS door and would not stop when called by staff. Pt was seen walking up the hill and leaving hospital property.   MD notified.
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
## 2024-01-19 ENCOUNTER — Encounter (HOSPITAL_COMMUNITY): Payer: Self-pay

## 2024-01-19 ENCOUNTER — Other Ambulatory Visit: Payer: Self-pay

## 2024-01-19 ENCOUNTER — Emergency Department (HOSPITAL_COMMUNITY)
Admission: EM | Admit: 2024-01-19 | Discharge: 2024-01-19 | Discharge status: Against Medical Advice | Payer: MEDICAID | Attending: Emergency Medicine | Admitting: Emergency Medicine

## 2024-01-19 DIAGNOSIS — G4700 Insomnia, unspecified: Secondary | ICD-10-CM | POA: Diagnosis present

## 2024-01-19 DIAGNOSIS — Z5321 Procedure and treatment not carried out due to patient leaving prior to being seen by health care provider: Secondary | ICD-10-CM | POA: Insufficient documentation

## 2024-01-19 NOTE — ED Provider Triage Note (Signed)
 Emergency Medicine Provider Triage Evaluation Note  Gary Mclaughlin , a 53 y.o. male  was evaluated in triage.  Pt complains of insomnia for 4 days reports he is typically a daily drinker drinking 3-4 beers a day stopped drinking 5 days ago.  No other symptoms.  Denies any anxiety, tremor, abdominal pain or nausea.  Review of Systems  Positive: Insomnia Negative: SI, HI  Physical Exam  BP (!) 137/93 (BP Location: Right Arm)   Pulse 87   Temp 97.9 F (36.6 C) (Oral)   Resp 20   SpO2 96%  Gen:   Awake, no distress   Resp:  Normal effort  MSK:   Moves extremities without difficulty  Other:  No signs of active withdrawal.  Medical Decision Making  Medically screening exam initiated at 5:51 PM.  Appropriate orders placed.  Miranda Rosato was informed that the remainder of the evaluation will be completed by another provider, this initial triage assessment does not replace that evaluation, and the importance of remaining in the ED until their evaluation is complete.     Shermon Warren SAILOR, PA-C 01/19/24 1752

## 2024-01-19 NOTE — ED Notes (Signed)
 Pt stated this hospital fucking sucks, slammed his bp cuff on the desk, and left. Pt taken OTF.

## 2024-01-19 NOTE — ED Triage Notes (Signed)
 Pt came in via POV d/t quit drinking 5 days ago & has not been able to sleep since then & states his equilibrium is off. A/Ox4, denies acute pain while in triage.

## 2024-02-22 ENCOUNTER — Inpatient Hospital Stay (HOSPITAL_COMMUNITY)
Admission: EM | Admit: 2024-02-22 | Discharge: 2024-02-26 | DRG: 199 | Disposition: A | Discharge status: Home / Self Care | Payer: MEDICAID | Attending: General Surgery | Admitting: General Surgery

## 2024-02-22 ENCOUNTER — Emergency Department (HOSPITAL_COMMUNITY): Payer: MEDICAID

## 2024-02-22 ENCOUNTER — Other Ambulatory Visit: Payer: Self-pay

## 2024-02-22 DIAGNOSIS — E871 Hypo-osmolality and hyponatremia: Secondary | ICD-10-CM | POA: Diagnosis not present

## 2024-02-22 DIAGNOSIS — F10239 Alcohol dependence with withdrawal, unspecified: Secondary | ICD-10-CM | POA: Diagnosis not present

## 2024-02-22 DIAGNOSIS — Z8249 Family history of ischemic heart disease and other diseases of the circulatory system: Secondary | ICD-10-CM | POA: Diagnosis not present

## 2024-02-22 DIAGNOSIS — J9601 Acute respiratory failure with hypoxia: Secondary | ICD-10-CM | POA: Diagnosis present

## 2024-02-22 DIAGNOSIS — S270XXA Traumatic pneumothorax, initial encounter: Secondary | ICD-10-CM | POA: Diagnosis present

## 2024-02-22 DIAGNOSIS — K760 Fatty (change of) liver, not elsewhere classified: Secondary | ICD-10-CM | POA: Diagnosis present

## 2024-02-22 DIAGNOSIS — Z79899 Other long term (current) drug therapy: Secondary | ICD-10-CM | POA: Diagnosis not present

## 2024-02-22 DIAGNOSIS — S2241XA Multiple fractures of ribs, right side, initial encounter for closed fracture: Secondary | ICD-10-CM | POA: Diagnosis present

## 2024-02-22 DIAGNOSIS — F1010 Alcohol abuse, uncomplicated: Secondary | ICD-10-CM

## 2024-02-22 DIAGNOSIS — F17210 Nicotine dependence, cigarettes, uncomplicated: Secondary | ICD-10-CM | POA: Diagnosis present

## 2024-02-22 DIAGNOSIS — W1789XA Other fall from one level to another, initial encounter: Secondary | ICD-10-CM | POA: Diagnosis present

## 2024-02-22 DIAGNOSIS — R0602 Shortness of breath: Secondary | ICD-10-CM

## 2024-02-22 DIAGNOSIS — J302 Other seasonal allergic rhinitis: Secondary | ICD-10-CM

## 2024-02-22 DIAGNOSIS — R10A1 Flank pain, right side: Secondary | ICD-10-CM | POA: Diagnosis present

## 2024-02-22 LAB — CBC
HCT: 36.1 % — ABNORMAL LOW (ref 39.0–52.0)
Hemoglobin: 12.3 g/dL — ABNORMAL LOW (ref 13.0–17.0)
MCH: 30.4 pg (ref 26.0–34.0)
MCHC: 34.1 g/dL (ref 30.0–36.0)
MCV: 89.4 fL (ref 80.0–100.0)
Platelets: 432 K/uL — ABNORMAL HIGH (ref 150–400)
RBC: 4.04 MIL/uL — ABNORMAL LOW (ref 4.22–5.81)
RDW: 18.6 % — ABNORMAL HIGH (ref 11.5–15.5)
WBC: 8.3 K/uL (ref 4.0–10.5)
nRBC: 0 % (ref 0.0–0.2)

## 2024-02-22 LAB — TROPONIN I (HIGH SENSITIVITY)
Troponin I (High Sensitivity): 4 ng/L (ref <18)
Troponin I (High Sensitivity): 7 ng/L (ref <18)

## 2024-02-22 LAB — BASIC METABOLIC PANEL WITH GFR
Anion gap: 13 (ref 5–15)
BUN: <5 mg/dL — ABNORMAL LOW (ref 6–20)
CO2: 23 mmol/L (ref 22–32)
Calcium: 9.5 mg/dL (ref 8.9–10.3)
Chloride: 91 mmol/L — ABNORMAL LOW (ref 98–111)
Creatinine, Ser: 0.53 mg/dL — ABNORMAL LOW (ref 0.61–1.24)
GFR, Estimated: >60 mL/min (ref >60)
Glucose, Bld: 94 mg/dL (ref 70–99)
Potassium: 4.9 mmol/L (ref 3.5–5.1)
Sodium: 127 mmol/L — ABNORMAL LOW (ref 135–145)

## 2024-02-22 MED ORDER — LORAZEPAM 1 MG PO TABS
0.0000 mg | ORAL_TABLET | ORAL | Status: DC
  Administered 2024-02-22: 1 mg via ORAL
  Administered 2024-02-23: 2 mg via ORAL
  Filled 2024-02-22: qty 1
  Filled 2024-02-22: qty 2

## 2024-02-22 MED ORDER — METHOCARBAMOL 1000 MG/10ML IJ SOLN: 500.0000 mg | Freq: Three times a day (TID) | INTRAMUSCULAR | Status: DC

## 2024-02-22 MED ORDER — METOPROLOL TARTRATE 5 MG/5ML IV SOLN
5.0000 mg | Freq: Four times a day (QID) | INTRAVENOUS | Status: DC | PRN
  Administered 2024-02-22: 5 mg via INTRAVENOUS
  Filled 2024-02-22: qty 5

## 2024-02-22 MED ORDER — LORAZEPAM 1 MG PO TABS: 0.0000 mg | ORAL_TABLET | Freq: Three times a day (TID) | ORAL | Status: DC

## 2024-02-22 MED ORDER — ENOXAPARIN SODIUM 30 MG/0.3ML IJ SOSY
30.0000 mg | PREFILLED_SYRINGE | Freq: Two times a day (BID) | INTRAMUSCULAR | Status: DC
  Administered 2024-02-23 – 2024-02-26 (×7): 30 mg via SUBCUTANEOUS
  Filled 2024-02-22 (×7): qty 0.3

## 2024-02-22 MED ORDER — IBUPROFEN 200 MG PO TABS
600.0000 mg | ORAL_TABLET | Freq: Four times a day (QID) | ORAL | Status: DC
  Administered 2024-02-22 – 2024-02-26 (×14): 600 mg via ORAL
  Filled 2024-02-22 (×12): qty 3
  Filled 2024-02-22: qty 1
  Filled 2024-02-22: qty 3

## 2024-02-22 MED ORDER — FENTANYL CITRATE (PF) 50 MCG/ML IJ SOSY
100.0000 ug | PREFILLED_SYRINGE | Freq: Once | INTRAMUSCULAR | Status: AC
  Administered 2024-02-22: 100 ug via INTRAVENOUS
  Filled 2024-02-22: qty 2

## 2024-02-22 MED ORDER — THIAMINE MONONITRATE 100 MG PO TABS: 100.0000 mg | ORAL_TABLET | Freq: Every day | ORAL | Status: DC

## 2024-02-22 MED ORDER — ONDANSETRON HCL 4 MG/2ML IJ SOLN: 4.0000 mg | Freq: Four times a day (QID) | INTRAMUSCULAR | Status: DC | PRN

## 2024-02-22 MED ORDER — LORAZEPAM 1 MG PO TABS: 1.0000 mg | ORAL_TABLET | ORAL | Status: DC | PRN

## 2024-02-22 MED ORDER — GABAPENTIN 300 MG PO CAPS
300.0000 mg | ORAL_CAPSULE | Freq: Three times a day (TID) | ORAL | Status: DC
  Administered 2024-02-22 – 2024-02-26 (×11): 300 mg via ORAL
  Filled 2024-02-22 (×11): qty 1

## 2024-02-22 MED ORDER — IOHEXOL 350 MG/ML SOLN
75.0000 mL | Freq: Once | INTRAVENOUS | Status: AC | PRN
  Administered 2024-02-22: 75 mL via INTRAVENOUS

## 2024-02-22 MED ORDER — THIAMINE MONONITRATE 100 MG PO TABS
100.0000 mg | ORAL_TABLET | Freq: Every day | ORAL | Status: DC
Start: 2024-02-22 — End: 2024-02-26
  Administered 2024-02-22 – 2024-02-26 (×5): 100 mg via ORAL
  Filled 2024-02-22 (×5): qty 1

## 2024-02-22 MED ORDER — LIDOCAINE HCL (PF) 1 % IJ SOLN
10.0000 mL | Freq: Once | INTRAMUSCULAR | Status: DC
  Filled 2024-02-22: qty 10

## 2024-02-22 MED ORDER — OXYCODONE HCL 5 MG PO TABS
5.0000 mg | ORAL_TABLET | ORAL | Status: DC | PRN
  Administered 2024-02-22 – 2024-02-26 (×8): 5 mg via ORAL
  Filled 2024-02-22 (×8): qty 1

## 2024-02-22 MED ORDER — HYDROMORPHONE HCL 1 MG/ML IJ SOLN
1.0000 mg | INTRAMUSCULAR | Status: DC | PRN
  Administered 2024-02-22: 1 mg via INTRAVENOUS
  Filled 2024-02-22: qty 1

## 2024-02-22 MED ORDER — LORAZEPAM 2 MG/ML IJ SOLN
1.0000 mg | INTRAMUSCULAR | Status: DC | PRN
  Administered 2024-02-22 (×2): 2 mg via INTRAVENOUS
  Filled 2024-02-22 (×2): qty 1

## 2024-02-22 MED ORDER — DOCUSATE SODIUM 100 MG PO CAPS
100.0000 mg | ORAL_CAPSULE | Freq: Two times a day (BID) | ORAL | Status: DC
  Administered 2024-02-22 – 2024-02-26 (×8): 100 mg via ORAL
  Filled 2024-02-22 (×8): qty 1

## 2024-02-22 MED ORDER — METHOCARBAMOL 500 MG PO TABS
500.0000 mg | ORAL_TABLET | Freq: Three times a day (TID) | ORAL | Status: DC
  Administered 2024-02-22 – 2024-02-23 (×2): 500 mg via ORAL
  Filled 2024-02-22 (×2): qty 1

## 2024-02-22 MED ORDER — ONDANSETRON 4 MG PO TBDP: 4.0000 mg | ORAL_TABLET | Freq: Four times a day (QID) | ORAL | Status: DC | PRN

## 2024-02-22 MED ORDER — POLYETHYLENE GLYCOL 3350 17 G PO PACK: 17.0000 g | PACK | Freq: Every day | ORAL | Status: DC | PRN

## 2024-02-22 MED ORDER — ADULT MULTIVITAMIN W/MINERALS CH
1.0000 | ORAL_TABLET | Freq: Every day | ORAL | Status: DC
  Administered 2024-02-22 – 2024-02-26 (×5): 1 via ORAL
  Filled 2024-02-22 (×5): qty 1

## 2024-02-22 MED ORDER — ACETAMINOPHEN 500 MG PO TABS
1000.0000 mg | ORAL_TABLET | Freq: Four times a day (QID) | ORAL | Status: DC
Start: 2024-02-22 — End: 2024-02-26
  Administered 2024-02-22 – 2024-02-26 (×13): 1000 mg via ORAL
  Filled 2024-02-22 (×14): qty 2

## 2024-02-22 MED ORDER — MORPHINE SULFATE (PF) 2 MG/ML IV SOLN
2.0000 mg | Freq: Once | INTRAVENOUS | Status: AC
  Administered 2024-02-22: 2 mg via INTRAVENOUS
  Filled 2024-02-22: qty 1

## 2024-02-22 MED ORDER — HYDRALAZINE HCL 20 MG/ML IJ SOLN: 10.0000 mg | INTRAMUSCULAR | Status: DC | PRN

## 2024-02-22 MED ORDER — ALBUTEROL SULFATE (2.5 MG/3ML) 0.083% IN NEBU
2.5000 mg | INHALATION_SOLUTION | Freq: Four times a day (QID) | RESPIRATORY_TRACT | Status: DC | PRN
  Administered 2024-02-23: 2.5 mg via RESPIRATORY_TRACT
  Filled 2024-02-22: qty 3

## 2024-02-22 MED ORDER — ORAL CARE MOUTH RINSE: 15.0000 mL | OROMUCOSAL | Status: DC | PRN

## 2024-02-22 MED ORDER — SODIUM CHLORIDE 0.9 % IV SOLN: INTRAVENOUS | Status: DC

## 2024-02-22 MED ORDER — FOLIC ACID 1 MG PO TABS
1.0000 mg | ORAL_TABLET | Freq: Every day | ORAL | Status: DC
  Administered 2024-02-22 – 2024-02-26 (×5): 1 mg via ORAL
  Filled 2024-02-22 (×5): qty 1

## 2024-02-22 MED ORDER — THIAMINE HCL 100 MG/ML IJ SOLN
100.0000 mg | Freq: Every day | INTRAMUSCULAR | Status: DC
Start: 2024-02-22 — End: 2024-02-26

## 2024-02-22 NOTE — ED Notes (Signed)
 This RN at bedside with MD [facility] for chest tube insertion. Pt signed informed consent.   Pt tolerated well.
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
## 2024-02-22 NOTE — ED Provider Notes (Signed)
 [place] EMERGENCY DEPARTMENT AT [facility] Provider Note   CSN: [id number] Arrival date & time: 02/22/24  1700     Patient presents with: Flank Pain   Gary Mclaughlin is a 53 y.o. male.    Flank Pain   Patient presents because of right rib pain.  Patient states that he is having shortness of breath as well.  Patient states that he fell yesterday about 5 AM out of a semitruck.  Patient states that he was inside the trailer whenever he subsequently fell.  Landed on his right side.  Has been having right-sided chest pain since then.  Did not hit his head and did not go consciousness.  Denies any anticoagulation.  No cervical thoracic or lumbar pain.  Does endorse some right-sided chest pain.  No nausea vomit diarrhea.  Also states that he stopped drinking.  Last drink was yesterday.  History of withdrawal seizures.  Patient states that he usually drinks about 3 40s per day.   Previous medical history reviewed : Patient was last admitted in May 2025.  Outside hospital.  AMS. Burn.       Prior to Admission medications   Medication Sig Start Date End Date Taking? Authorizing Provider  albuterol  (VENTOLIN  HFA) 108 (90 Base) MCG/ACT inhaler Inhale 2 puffs into the lungs every 6 (six) hours as needed for wheezing or shortness of breath. 09/30/23   Teresa Norris A, PA-C  bacitracin  ointment Apply 1 Application topically 2 (two) times daily. 10/06/23   Yolande Lamar BROCKS, MD  cetirizine  (ZYRTEC  ALLERGY) 10 MG tablet Take 1 tablet (10 mg total) by mouth daily. 04/20/23   Mayers, Cari S, PA-C  chlordiazePOXIDE  (LIBRIUM ) 25 MG capsule Take 1 capsule (25 mg total) by mouth daily. 09/30/23   White, Elizabeth A, PA-C  clotrimazole  (LOTRIMIN ) 1 % cream Apply topically 2 (two) times daily. 04/13/23   Izella Ismael NOVAK, MD  doxycycline  (VIBRAMYCIN ) 100 MG capsule Take 1 capsule (100 mg total) by mouth 2 (two) times daily. 10/06/23   Yolande Lamar BROCKS, MD  gabapentin  (NEURONTIN ) 300 MG capsule  Take 1 capsule (300 mg total) by mouth 2 (two) times daily. 04/13/23   Izella Ismael NOVAK, MD  hydrOXYzine  (ATARAX ) 25 MG tablet Take 1 tablet (25 mg total) by mouth 3 (three) times daily as needed for anxiety. 09/30/23   White, Elizabeth A, PA-C  nicotine  (NICODERM CQ  - DOSED IN MG/24 HOURS) 21 mg/24hr patch Place 1 patch (21 mg total) onto the skin daily. 04/14/23   Izella Ismael NOVAK, MD  oxyCODONE  (ROXICODONE ) 5 MG immediate release tablet Take 1 tablet (5 mg total) by mouth every 4 (four) hours as needed. 10/06/23   Yolande Lamar BROCKS, MD  traMADol  (ULTRAM ) 50 MG tablet Take 1 tablet (50 mg total) by mouth every 6 (six) hours as needed for severe pain (pain score 7-10). 09/30/23   Teresa Norris LABOR, PA-C    Allergies: Patient has no known allergies.    Review of Systems  Genitourinary:  Positive for flank pain.    Updated Vital Signs BP (!) 174/104   Pulse (!) 109   Temp 98 F (36.7 C)   Resp (!) 21   Ht 5' 8 (1.727 m)   Wt 74.8 kg   SpO2 97%   BMI 25.09 kg/m   Physical Exam Vitals and nursing note reviewed.  Constitutional:      General: He is not in acute distress.    Appearance: He is well-developed.  HENT:  Head: Normocephalic and atraumatic.  Eyes:     Conjunctiva/sclera: Conjunctivae normal.  Cardiovascular:     Rate and Rhythm: Normal rate and regular rhythm.     Heart sounds: No murmur heard. Pulmonary:     Effort: Pulmonary effort is normal. No respiratory distress.     Comments: Decreased breath sounds right side  Abdominal:     Palpations: Abdomen is soft.     Tenderness: There is no abdominal tenderness.  Musculoskeletal:        General: No swelling.     Cervical back: Neck supple.  Skin:    General: Skin is warm and dry.     Capillary Refill: Capillary refill takes less than 2 seconds.  Neurological:     Mental Status: He is alert.  Psychiatric:        Mood and Affect: Mood normal.     (all labs ordered are listed, but only abnormal results are  displayed) Labs Reviewed  BASIC METABOLIC PANEL WITH GFR - Abnormal; Notable for the following components:      Result Value   Sodium 127 (*)    Chloride 91 (*)    BUN <5 (*)    Creatinine, Ser 0.53 (*)    All other components within normal limits  CBC - Abnormal; Notable for the following components:   RBC 4.04 (*)    Hemoglobin 12.3 (*)    HCT 36.1 (*)    RDW 18.6 (*)    Platelets 432 (*)    All other components within normal limits  HIV ANTIBODY (ROUTINE TESTING W REFLEX)  CBC  BASIC METABOLIC PANEL WITH GFR  CBC  CREATININE, SERUM  TROPONIN I (HIGH SENSITIVITY)  TROPONIN I (HIGH SENSITIVITY)    EKG: EKG Interpretation Date/Time:  Monday February 22 2024 17:35:27 EDT Ventricular Rate:  104 PR Interval:  148 QRS Duration:  90 QT Interval:  350 QTC Calculation: 460 R Axis:   89  Text Interpretation: Sinus tachycardia Otherwise normal ECG When compared with ECG of 02-Jul-2023 18:09, PREVIOUS ECG IS PRESENT Confirmed by Simon Rea [phone]) on 02/22/2024 5:47:37 PM  Radiology: DG Chest Portable 1 View Result Date: 02/22/2024 EXAM: 1 VIEW XRAY OF THE CHEST 02/22/2024 08:01:00 PM COMPARISON: Chest x-ray 02/27/2019, CT chest 02/27/2019. CLINICAL HISTORY: S/P chest tube. S/P chest tube placement. FINDINGS: LINES, TUBES AND DEVICES: Interval placement of the right chest tube with pigtail overlying the inferior lateral most aspect of the right lower lung zone. LUNGS AND PLEURA: Interval resolution of right pneumothorax. Persistent trace right pleural effusion. No left pneumothorax or pleural effusion. No focal pulmonary opacity. No pulmonary edema. HEART AND MEDIASTINUM: No acute abnormality of the cardiac and mediastinal silhouettes. BONES AND SOFT TISSUES: Redemonstration of acute displaced posterior seventh and eight rib fracture. Right chest wall for any soft tissue emphysema. IMPRESSION: 1. Interval resolution of right pneumothorax status post right chest tube placement. 2. Trace  right pleural effusion on the right. 3. Acute displaced fractures of the right posterior seventh and eighth ribs. Electronically signed by: Morgane Naveau MD 02/22/2024 08:20 PM EDT RP Workstation: [id number]   CT CHEST ABDOMEN PELVIS W CONTRAST Result Date: 02/22/2024 CLINICAL DATA:  [place] off tractor yesterday, blunt trauma EXAM: CT CHEST, ABDOMEN, AND PELVIS WITH CONTRAST TECHNIQUE: Multidetector CT imaging of the chest, abdomen and pelvis was performed following the standard protocol during bolus administration of intravenous contrast. RADIATION DOSE REDUCTION: This exam was performed according to the departmental dose-optimization program which includes automated exposure control, adjustment  of the mA and/or kV according to patient size and/or use of iterative reconstruction technique. CONTRAST:  75mL OMNIPAQUE  IOHEXOL  350 MG/ML SOLN COMPARISON:  02/22/2024, 09/14/2022 FINDINGS: CT CHEST FINDINGS Cardiovascular: The heart is unremarkable without pericardial effusion. No evidence of acute vascular injury. Atherosclerosis of the aorta. Mediastinum/Nodes: There is focal short segment wall thickening of the distal thoracic esophagus, reference image 44/3. Further evaluation with nonemergent esophagram or endoscopy may be useful. Small hiatal hernia. Thyroid  and trachea are unremarkable.  No pathologic adenopathy. Lungs/Pleura: There is a right-sided pigtail pleural drainage catheter within the anterior right lower chest, with near complete resolution of the right pneumothorax seen previously. Trace residual pneumothorax remains at the right apex. A rounded area of consolidation and gas lucency within the superior segment right lower lobe measuring 3.9 x 3.2 cm reference image 25/3 likely reflects pulmonary contusion and laceration, given overlying rib fractures. The left chest is clear.  Central airways are patent. Musculoskeletal: There are acute minimally displaced right posterolateral seventh and eighth rib  fractures. Subcutaneous gas within the right posterolateral chest wall consistent with known pneumothorax and right chest tube placement. There are no other acute or destructive bony abnormalities. Reconstructed images demonstrate no additional findings. CT ABDOMEN PELVIS FINDINGS Hepatobiliary: No hepatic injury or perihepatic hematoma. Gallbladder is unremarkable. Pancreas: Unremarkable. No pancreatic ductal dilatation or surrounding inflammatory changes. Spleen: No splenic injury or perisplenic hematoma. Adrenals/Urinary Tract: No adrenal hemorrhage or renal injury identified. Bladder is unremarkable. Stomach/Bowel: No bowel obstruction or ileus. Normal retrocecal appendix. There is a small hiatal hernia, with nonspecific wall thickening of the gastric cardia and gastroesophageal junction. Vascular/Lymphatic: Aortic atherosclerosis. No enlarged abdominal or pelvic lymph nodes. Reproductive: Prostate is unremarkable. Other: No free fluid or free intraperitoneal gas. No abdominal wall hernia. Musculoskeletal: There are no acute displaced fractures. Prior healed fractures of the left superior and inferior pubic rami and left sacral ala are noted. Cannulated screws traverse the bilateral sacroiliac joints and left superior pubic ramus consistent with prior fracture repair. Stable chronic L1 compression deformity. Reconstructed images demonstrate no additional findings. IMPRESSION: 1. Near complete resolution of the right pneumothorax after interval chest tube placement. Trace residual right apical pneumothorax without tension effect. 2. Rounded area of consolidation in internal gas lucency within the superior segment right lower lobe, favor contusion and laceration given overlying rib fractures. 3. Acute minimally displaced right posterolateral seventh and eighth rib fractures. 4. Short segment circumferential mural thickening of the distal thoracic esophagus, as well as mural thickening of the gastric cardia and  gastroesophageal junction. Further evaluation with nonemergent esophagram or endoscopy is recommended to assess for underlying mass. 5.  Aortic Atherosclerosis (ICD10-I70.0). Electronically Signed   By: Ozell Daring M.D.   On: 02/22/2024 19:32   CT Head Wo Contrast Result Date: 02/22/2024 EXAM: CT HEAD AND CERVICAL SPINE 02/22/2024 07:17:56 PM TECHNIQUE: CT of the head and cervical spine was performed without the administration of intravenous contrast. Multiplanar reformatted images are provided for review. Automated exposure control, iterative reconstruction, and/or weight based adjustment of the mA/kV was utilized to reduce the radiation dose to as low as reasonably achievable. COMPARISON: CT head and c-spine 07/02/2023. CLINICAL HISTORY: Blunt trauma. FINDINGS: CT HEAD BRAIN AND VENTRICLES: No acute intracranial hemorrhage. No mass effect or midline shift. No abnormal extra-axial fluid collection. No evidence of acute infarct. No hydrocephalus. Atherosclerotic calcifications are present within the cavernous internal carotid arteries. Left inferior frontal lobe encephalomalacia. ORBITS: No acute abnormality. SINUSES AND MASTOIDS:N No acute abnormality. SOFT  TISSUES AND SKULL: No acute skull fracture. CT CERVICAL SPINE BONES AND ALIGNMENT: No acute fracture or traumatic malalignment. DEGENERATIVE CHANGES: Multilevel moderate degenerative changes of the spine. No associated severe osseous neural foraminal or central canal stenosis. Moderate osseous neural foraminal stenosis noted at the right C5-C6 level. SOFT TISSUES: No prevertebral soft tissue swelling. Superior soft tissue emphysema along the right back soft tissues noted. INCIDENTAL CHEST FINDINGS: At least small right apical pneumothorax - please see separately dictated CT chest 1025. Right azygous fissure noted. IMPRESSION: 1. At least small volume right apical pneumothorax and superior soft tissue emphysema along the right back soft tissues, correlate  with separately dictated chest CT findings. 2. No acute intracranial abnormality. 3. No acute cervical spine fracture or traumatic malalignment. Electronically signed by: Morgane Naveau MD 02/22/2024 07:29 PM EDT RP Workstation: [id number]   CT Cervical Spine Wo Contrast Result Date: 02/22/2024 EXAM: CT HEAD AND CERVICAL SPINE 02/22/2024 07:17:56 PM TECHNIQUE: CT of the head and cervical spine was performed without the administration of intravenous contrast. Multiplanar reformatted images are provided for review. Automated exposure control, iterative reconstruction, and/or weight based adjustment of the mA/kV was utilized to reduce the radiation dose to as low as reasonably achievable. COMPARISON: CT head and c-spine 07/02/2023. CLINICAL HISTORY: Blunt trauma. FINDINGS: CT HEAD BRAIN AND VENTRICLES: No acute intracranial hemorrhage. No mass effect or midline shift. No abnormal extra-axial fluid collection. No evidence of acute infarct. No hydrocephalus. Atherosclerotic calcifications are present within the cavernous internal carotid arteries. Left inferior frontal lobe encephalomalacia. ORBITS: No acute abnormality. SINUSES AND MASTOIDS:N No acute abnormality. SOFT TISSUES AND SKULL: No acute skull fracture. CT CERVICAL SPINE BONES AND ALIGNMENT: No acute fracture or traumatic malalignment. DEGENERATIVE CHANGES: Multilevel moderate degenerative changes of the spine. No associated severe osseous neural foraminal or central canal stenosis. Moderate osseous neural foraminal stenosis noted at the right C5-C6 level. SOFT TISSUES: No prevertebral soft tissue swelling. Superior soft tissue emphysema along the right back soft tissues noted. INCIDENTAL CHEST FINDINGS: At least small right apical pneumothorax - please see separately dictated CT chest 1025. Right azygous fissure noted. IMPRESSION: 1. At least small volume right apical pneumothorax and superior soft tissue emphysema along the right back soft tissues,  correlate with separately dictated chest CT findings. 2. No acute intracranial abnormality. 3. No acute cervical spine fracture or traumatic malalignment. Electronically signed by: Morgane Naveau MD 02/22/2024 07:29 PM EDT RP Workstation: [id number]   DG Chest Port 1 View Result Date: 02/22/2024 CLINICAL DATA:  [id number] Chest pain [id number] EXAM: PORTABLE CHEST - 1 VIEW COMPARISON:  10/06/2023 FINDINGS: Coarsening of the pulmonary interstitium with relative flattening of the diaphragms, suggesting emphysema. Moderate volume sub pulmonic pneumothorax with a small amount of subcutaneous emphysema along the right chest wall. Rounded opacity overlying the medial right lung base measuring 2.8 cm. No pleural effusion. No cardiomegaly.Mildly displaced, 6 and seventh rib fractures. Multilevel thoracic osteophytosis. IMPRESSION: 1. Moderate volume subpulmonic right pneumothorax with a small amount of subcutaneous emphysema. No mediastinal shift to suggest tension pathology. 2. Rounded opacity overlying the medial right lung base, measuring 2.8 cm, which is indeterminate, and could represent an external structure to the patient. Correlation with physical exam findings recommended. Nonemergent chest CT with IV contrast should also be considered. 3. Mildly displaced, posterior right 6 and 7 rib fractures. Critical Value/emergent results were called by telephone at the time of interpretation on 02/22/2024 at 6:27 pm to provider LAVONIA PAT MD, who verbally acknowledged these results. Electronically Signed  By: Rogelia Myers M.D.   On: 02/22/2024 18:30     CHEST TUBE INSERTION  Date/Time: 02/22/2024 8:50 PM  Performed by: Simon Lavonia SAILOR, MD Authorized by: Simon Lavonia SAILOR, MD   Consent:    Consent obtained:  Verbal and written   Consent given by:  Patient   Risks, benefits, and alternatives were discussed: yes     Risks discussed:  Bleeding, incomplete drainage, nerve damage, pain, infection and damage to  surrounding structures   Alternatives discussed:  Alternative treatment Universal protocol:    Patient identity confirmed:  Verbally with patient Pre-procedure details:    Skin preparation:  Povidone-iodine and alcohol  Sedation:    Sedation type:  Anxiolysis Anesthesia:    Anesthesia method:  Local infiltration   Local anesthetic:  Lidocaine  1% WITH epi Procedure details:    Placement location:  R lateral   Ultrasound guidance: yes     Tension pneumothorax: no     Tube connected to:  Heimlich valve, suction and water seal   Drainage characteristics:  Bloody   Suture material:  2-0 silk   Dressing:  4x4 sterile gauze, Xeroform gauze and petrolatum-impregnated gauze Post-procedure details:    Post-insertion x-ray findings: tube in good position     Procedure completion:  Tolerated    Medications Ordered in the ED  acetaminophen  (TYLENOL ) tablet 1,000 mg (has no administration in time range)  methocarbamol  (ROBAXIN ) tablet 500 mg (has no administration in time range)    Or  methocarbamol  (ROBAXIN ) injection 500 mg (has no administration in time range)  docusate sodium  (COLACE) capsule 100 mg (has no administration in time range)  polyethylene glycol (MIRALAX  / GLYCOLAX ) packet 17 g (has no administration in time range)  ondansetron  (ZOFRAN -ODT) disintegrating tablet 4 mg (has no administration in time range)    Or  ondansetron  (ZOFRAN ) injection 4 mg (has no administration in time range)  metoprolol tartrate (LOPRESSOR) injection 5 mg (has no administration in time range)  hydrALAZINE  (APRESOLINE ) injection 10 mg (has no administration in time range)  LORazepam  (ATIVAN ) tablet 1-4 mg (has no administration in time range)    Or  LORazepam  (ATIVAN ) injection 1-4 mg (has no administration in time range)  thiamine  (VITAMIN B1) tablet 100 mg (has no administration in time range)    Or  thiamine  (VITAMIN B1) injection 100 mg (has no administration in time range)  folic acid  (FOLVITE )  tablet 1 mg (has no administration in time range)  multivitamin with minerals tablet 1 tablet (has no administration in time range)  enoxaparin  (LOVENOX ) injection 30 mg (has no administration in time range)  0.9 %  sodium chloride  infusion (has no administration in time range)  oxyCODONE  (Oxy IR/ROXICODONE ) immediate release tablet 5 mg (has no administration in time range)  HYDROmorphone  (DILAUDID ) injection 1 mg (has no administration in time range)  ibuprofen  (ADVIL ) tablet 600 mg (has no administration in time range)  gabapentin  (NEURONTIN ) capsule 300 mg (has no administration in time range)  LORazepam  (ATIVAN ) tablet 0-4 mg (has no administration in time range)    Followed by  LORazepam  (ATIVAN ) tablet 0-4 mg (has no administration in time range)  albuterol  (PROVENTIL ) (2.5 MG/3ML) 0.083% nebulizer solution 2.5 mg (has no administration in time range)  fentaNYL  (SUBLIMAZE ) injection 100 mcg (100 mcg Intravenous Given 02/22/24 1823)  morphine  (PF) 2 MG/ML injection 2 mg (2 mg Intravenous Given 02/22/24 1939)  iohexol  (OMNIPAQUE ) 350 MG/ML injection 75 mL (75 mLs Intravenous Contrast Given 02/22/24 1918)  Medical Decision Making Amount and/or Complexity of Data Reviewed Labs: ordered. Radiology: ordered.  Risk Prescription drug management. Decision regarding hospitalization.     HPI:   Patient presents because of right rib pain.  Patient states that he is having shortness of breath as well.  Patient states that he fell yesterday about 5 AM out of a semitruck.  Patient states that he was inside the trailer whenever he subsequently fell.  Landed on his right side.  Has been having right-sided chest pain since then.  Did not hit his head and did not go consciousness.  Denies any anticoagulation.  No cervical thoracic or lumbar pain.  Does endorse some right-sided chest pain.  No nausea vomit diarrhea.  Also states that he stopped drinking.  Last  drink was yesterday.  History of withdrawal seizures.  Patient states that he usually drinks about 3 40s per day.   Previous medical history reviewed : Patient was last admitted in May 2025.  Outside hospital.  AMS. Burn.    MDM:    Upon exam, patient in slight respiratory distress.  Requiring 4 to 5 L nasal cannula to maintain O2 saturations above 90 degrees.  Patient did have decreased breath sounds on the right side when compared to the left.  Concern for pneumothorax based off of exam.  Will also need further imaging given mechanism of fall and injury.  Will obtain CT head as well as CT C-spine.  Plan will obtain CT chest abdomen as well to rule out any kind of other intrathoracic or intra-abdominal pathology.  + Whenever x-ray was obtained.  Does show the pneumothorax on the right side.  Therefore, patient consented for emergent pigtail catheter insertion.  This is successful.  Refer to above procedure note.  No acute complications.  Mostly airway did have a little bit of blood in the pigtail catheter after insertion as well.  Tolerated well.   Reevaluation:   Patient has multiple rib fractures on the right side.  No obvious intracranial bleed or any kind of a cervical injury.  No intra-abdominal pathology was seen.  Patient remains of a couple liters of oxygen's for respiratory support in the setting of his known pneumothorax to help it heal.  Patient was given multiple rounds of IV pain medication.  Patient does have a history of alcohol  withdrawal seizures.  Currently no significant withdrawal symptoms.  Will be to be carefully manage inpatient  Patient admitted to trauma service in the setting of rib fractures as well as pneumothorax.  Status post pigtail catheter  Interventions: Pigtail catheter  EKG Interpreted by Me: NSR   Cardiac Tele Interpreted by Me: NSR /sinus tachycardia. No arrythmia    I have independently interpreted the CXR = and CT  images and agree with the  radiologist finding   Social Determinant of Health: Homeless, alcohol  abuse    Disposition and Follow Up: ADMIT    CRITICAL CARE Performed by: Lavonia LOISE Pat   Total critical care time: 45 minutes  Critical care time was exclusive of separately billable procedures and treating other patients.  Critical care was necessary to treat or prevent imminent or life-threatening deterioration.  Critical care was time spent personally by me on the following activities: development of treatment plan with patient and/or surrogate as well as nursing, discussions with consultants, evaluation of patient's response to treatment, examination of patient, obtaining history from patient or surrogate, ordering and performing treatments and interventions, ordering and review of laboratory studies, ordering and review  of radiographic studies, pulse oximetry and re-evaluation of patient's condition.      Final diagnoses:  Traumatic pneumothorax, initial encounter  Closed fracture of multiple ribs of right side, initial encounter  Alcohol  abuse    ED Discharge Orders     None          Simon Lavonia SAILOR, MD 02/22/24 2055

## 2024-02-22 NOTE — H&P (Signed)
 Activation and Reason: consult, fell out of semi  Primary Survey: airway intact, chest tube in place, breath sounds present bilaterally  Gary Mclaughlin is an 53 y.o. male.  HPI: 53 yo male fell out of a semi truck he was sleeping in yesterday morning around 5 am. He couldn't see anything and fell. He initially thought he could walk around and the pain would improve. He presents for worsening pain. Pain is over his right chest. It is intense, constant, and worse with breathing.  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  Past Medical History:  Diagnosis Date   Depression    ETOH abuse    Hepatic steatosis 10/30/2021   Seizure (HCC)    Subarachnoid hemorrhage (HCC) 07/28/2017   Tobacco abuse 10/27/2021    Past Surgical History:  Procedure Laterality Date   ORIF PELVIC FRACTURE WITH PERCUTANEOUS SCREWS Left 04/17/2022   Procedure: SI SCREW AND ORIF SYMPHYSIS PELVIC RING;  Surgeon: Kendal Franky SQUIBB, MD;  Location: [facility] OR;  Service: Orthopedics;  Laterality: Left;    Family History  Problem Relation Age of Onset   Hypertension Other     Social History:  reports that he has quit smoking. His smoking use included cigarettes. He has a 25 pack-year smoking history. He has never used smokeless tobacco. He reports that he does not currently use alcohol . He reports that he does not use drugs.  Allergies: No Known Allergies  Medications: I have reviewed the patient's current medications.  Results for orders placed or performed during the hospital encounter of 02/22/24 (from the past 48 hours)  Basic metabolic panel     Status: Abnormal   Collection Time: 02/22/24  5:31 PM  Result Value Ref Range   Sodium 127 (L) 135 - 145 mmol/L   Potassium 4.9 3.5 - 5.1 mmol/L    Comment: HEMOLYSIS AT THIS LEVEL MAY AFFECT RESULT   Chloride 91 (L) 98 - 111 mmol/L   CO2 23 22 - 32 mmol/L   Glucose, Bld 94 70 - 99 mg/dL    Comment: Glucose reference range applies only to samples  taken after fasting for at least 8 hours.   BUN <5 (L) 6 - 20 mg/dL   Creatinine, Ser 9.46 (L) 0.61 - 1.24 mg/dL   Calcium 9.5 8.9 - 10.3 mg/dL   GFR, Estimated >39 >39 mL/min    Comment: (NOTE) Calculated using the CKD-EPI Creatinine Equation (2021)    Anion gap 13 5 - 15    Comment: Performed at [facility], [address], [place], [place] [address]  CBC     Status: Abnormal   Collection Time: 02/22/24  5:31 PM  Result Value Ref Range   WBC 8.3 4.0 - 10.5 K/uL   RBC 4.04 (L) 4.22 - 5.81 MIL/uL   Hemoglobin 12.3 (L) 13.0 - 17.0 g/dL   HCT 63.8 (L) 60.9 - 47.9 %   MCV 89.4 80.0 - 100.0 fL   MCH 30.4 26.0 - 34.0 pg   MCHC 34.1 30.0 - 36.0 g/dL   RDW 81.3 (H) 88.4 - 84.4 %   Platelets 432 (H) 150 - 400 K/uL   nRBC 0.0 0.0 - 0.2 %    Comment: Performed at [facility], [address] [place], [place] [address]  Troponin I (High Sensitivity)     Status: None   Collection Time: 02/22/24  5:31 PM  Result Value Ref Range   Troponin I (High Sensitivity) 4 <  18 ng/L    Comment: (NOTE) Elevated high sensitivity troponin I (hsTnI) values and significant  changes across serial measurements may suggest ACS but many other  chronic and acute conditions are known to elevate hsTnI results.  Refer to the Links section for chest pain algorithms and additional  guidance. Performed at [facility], [address], [place], [place] [address]       PE Blood pressure (!) 174/104, pulse (!) 109, temperature 98 F (36.7 C), resp. rate (!) 21, height 5' 8 (1.727 m), weight 74.8 kg, SpO2 97%. Constitutional: NAD; conversant; no deformities, odorous Eyes: Moist conjunctiva; no lid lag; anicteric; PERRL Neck: Trachea midline; no thyromegaly, no cervicalgia Lungs: Normal respiratory effort; no tactile fremitus CV: RRR; no palpable thrills; no pitting edema GI: Abd soft, NT; no palpable hepatosplenomegaly MSK: unable to assess gait; no clubbing/cyanosis Psychiatric:  Appropriate affect; alert and oriented x3 Lymphatic: No palpable cervical or axillary lymphadenopathy   Assessment/Plan: 53 yo male with right sided pneumothorax s/p pigtail catheter. Current alcohol  dependence. -pain control -admit to trauma progressive care -CIWA -pulm toilet -am xr  Procedures: none  I reviewed last 24 h vitals and pain scores, last 48 h intake and output, last 24 h labs and trends, and last 24 h imaging results.  This care required high  level of medical decision making.   Herlene Righter Kendi Defalco 02/22/2024, 8:28 PM

## 2024-02-22 NOTE — Progress Notes (Signed)
 Transition of Care [facility]) - CAGE-AID Screening   Patient Details  Name: Gary Mclaughlin MRN: [id number] Date of Birth: 10-10-1970   MARINDA LIONEL Sora, RN Phone Number: 02/22/2024, 11:33 PM   Clinical Narrative:  Pt denies elicit substance usage Pt reports 1PPD cigarette usage Pt reports at least 3 - 40oz Beers daily - last drink reports as yesterday. Pt noted to be tremulous at this time. CIWA already ordered by TMD.  Pt self reports he is an alcoholic and has experienced seizure in the past while in withdrawal.    CAGE-AID Screening:    Have You Ever Felt You Ought to Cut Down on Your Drinking or Drug Use?: Yes Have People Annoyed You By Critizing Your Drinking Or Drug Use?: Yes Have You Felt Bad Or Guilty About Your Drinking Or Drug Use?: Yes Have You Ever Had a Drink or Used Drugs First Thing In The Morning to Steady Your Nerves or to Get Rid of a Hangover?: Yes CAGE-AID Score: 4  Substance Abuse Education Offered: Yes  Substance abuse interventions: Patient Counseling

## 2024-02-23 ENCOUNTER — Inpatient Hospital Stay (HOSPITAL_COMMUNITY): Payer: MEDICAID

## 2024-02-23 LAB — CREATININE, SERUM
Creatinine, Ser: 0.59 mg/dL — ABNORMAL LOW (ref 0.61–1.24)
GFR, Estimated: >60 mL/min (ref >60)

## 2024-02-23 LAB — BASIC METABOLIC PANEL WITH GFR
Anion gap: 8 (ref 5–15)
BUN: 6 mg/dL (ref 6–20)
CO2: 27 mmol/L (ref 22–32)
Calcium: 9.5 mg/dL (ref 8.9–10.3)
Chloride: 93 mmol/L — ABNORMAL LOW (ref 98–111)
Creatinine, Ser: 0.64 mg/dL (ref 0.61–1.24)
GFR, Estimated: >60 mL/min (ref >60)
Glucose, Bld: 95 mg/dL (ref 70–99)
Potassium: 4.7 mmol/L (ref 3.5–5.1)
Sodium: 128 mmol/L — ABNORMAL LOW (ref 135–145)

## 2024-02-23 LAB — HIV ANTIBODY (ROUTINE TESTING W REFLEX): HIV Screen 4th Generation wRfx: NONREACTIVE

## 2024-02-23 LAB — CBC
HCT: 33.5 % — ABNORMAL LOW (ref 39.0–52.0)
HCT: 34.6 % — ABNORMAL LOW (ref 39.0–52.0)
Hemoglobin: 11.3 g/dL — ABNORMAL LOW (ref 13.0–17.0)
Hemoglobin: 11.6 g/dL — ABNORMAL LOW (ref 13.0–17.0)
MCH: 29.4 pg (ref 26.0–34.0)
MCH: 30.1 pg (ref 26.0–34.0)
MCHC: 33.5 g/dL (ref 30.0–36.0)
MCHC: 33.7 g/dL (ref 30.0–36.0)
MCV: 87.8 fL (ref 80.0–100.0)
MCV: 89.1 fL (ref 80.0–100.0)
Platelets: 385 K/uL (ref 150–400)
Platelets: 426 K/uL — ABNORMAL HIGH (ref 150–400)
RBC: 3.76 MIL/uL — ABNORMAL LOW (ref 4.22–5.81)
RBC: 3.94 MIL/uL — ABNORMAL LOW (ref 4.22–5.81)
RDW: 18.6 % — ABNORMAL HIGH (ref 11.5–15.5)
RDW: 18.6 % — ABNORMAL HIGH (ref 11.5–15.5)
WBC: 7.4 K/uL (ref 4.0–10.5)
WBC: 7.9 K/uL (ref 4.0–10.5)
nRBC: 0 % (ref 0.0–0.2)
nRBC: 0 % (ref 0.0–0.2)

## 2024-02-23 MED ORDER — SODIUM CHLORIDE 0.9% FLUSH
3.0000 mL | Freq: Two times a day (BID) | INTRAVENOUS | Status: DC
  Administered 2024-02-23 – 2024-02-26 (×7): 3 mL via INTRAVENOUS

## 2024-02-23 MED ORDER — SODIUM CHLORIDE 0.9 % IV SOLN: 250.0000 mL | INTRAVENOUS | Status: AC | PRN

## 2024-02-23 MED ORDER — PHENOBARBITAL 32.4 MG PO TABS
64.8000 mg | ORAL_TABLET | Freq: Three times a day (TID) | ORAL | Status: DC
  Administered 2024-02-25 – 2024-02-26 (×4): 64.8 mg via ORAL
  Filled 2024-02-23 (×4): qty 2

## 2024-02-23 MED ORDER — PHENOBARBITAL 32.4 MG PO TABS: 32.4000 mg | ORAL_TABLET | Freq: Three times a day (TID) | ORAL | Status: DC

## 2024-02-23 MED ORDER — METHOCARBAMOL 500 MG PO TABS
1000.0000 mg | ORAL_TABLET | Freq: Three times a day (TID) | ORAL | Status: DC
  Administered 2024-02-23 – 2024-02-26 (×9): 1000 mg via ORAL
  Filled 2024-02-23 (×9): qty 2

## 2024-02-23 MED ORDER — PHENOBARBITAL 32.4 MG PO TABS
97.2000 mg | ORAL_TABLET | Freq: Three times a day (TID) | ORAL | Status: AC
  Administered 2024-02-23 – 2024-02-25 (×6): 97.2 mg via ORAL
  Filled 2024-02-23 (×6): qty 3

## 2024-02-23 MED ORDER — SODIUM CHLORIDE 0.9% FLUSH: 3.0000 mL | INTRAVENOUS | Status: DC | PRN

## 2024-02-23 MED ORDER — SPIRITUS FRUMENTI
1.0000 | Freq: Three times a day (TID) | ORAL | Status: DC
Start: 2024-02-23 — End: 2024-02-26
  Administered 2024-02-23 – 2024-02-25 (×8): 1 via ORAL
  Filled 2024-02-23 (×11): qty 1

## 2024-02-23 MED ORDER — LORAZEPAM 2 MG/ML IJ SOLN
1.0000 mg | INTRAMUSCULAR | Status: DC | PRN
  Administered 2024-02-25: 1 mg via INTRAVENOUS
  Filled 2024-02-23: qty 1

## 2024-02-23 NOTE — Progress Notes (Signed)
 Progress Note     Subjective: Pt comfortable this AM, having some breakfast. Agreeable to drink some beer with meals. Reports he has a PCP that he sees periodically but denies any daily medications.   Objective: Vital signs in last 24 hours: Temp:  [97.6 F (36.4 C)-98.4 F (36.9 C)] 97.6 F (36.4 C) (10/21 0752) Pulse Rate:  [53-109] 53 (10/21 0752) Resp:  [17-21] 18 (10/21 0752) BP: (130-193)/(86-104) 150/92 (10/21 0752) SpO2:  [92 %-100 %] 100 % (10/21 0752) Weight:  [74.8 kg] 74.8 kg (10/20 1702)    Intake/Output from previous day: 10/20 0701 - 10/21 0700 In: -  Out: 82 [Chest Tube:82] Intake/Output this shift: No intake/output data recorded.  PE: General: pleasant, WD, WN male who is laying in bed in NAD Heart: regular, rate, and rhythm.   Lungs: CTAB, no wheezes, rhonchi, or rales noted.  Respiratory effort nonlabored, R CT in place w/o air leak, SS fluid in sahara Abd: soft, NT, ND MS: all 4 extremities are symmetrical with no cyanosis, clubbing, or edema. Psych: Alert, a little sedated but able to answer questions appropriately   Lab Results:  Recent Labs    02/22/24 2335 02/23/24 0230  WBC 7.9 7.4  HGB 11.6* 11.3*  HCT 34.6* 33.5*  PLT 426* 385   BMET Recent Labs    02/22/24 1731 02/22/24 2335 02/23/24 0230  NA 127*  --  128*  K 4.9  --  4.7  CL 91*  --  93*  CO2 23  --  27  GLUCOSE 94  --  95  BUN <5*  --  6  CREATININE 0.53* 0.59* 0.64  CALCIUM 9.5  --  9.5   PT/INR No results for input(s): LABPROT, INR in the last 72 hours. CMP     Component Value Date/Time   NA 128 (L) 02/23/2024 0230   K 4.7 02/23/2024 0230   CL 93 (L) 02/23/2024 0230   CO2 27 02/23/2024 0230   GLUCOSE 95 02/23/2024 0230   BUN 6 02/23/2024 0230   CREATININE 0.64 02/23/2024 0230   CALCIUM 9.5 02/23/2024 0230   PROT 8.0 12/20/2023 0758   ALBUMIN 4.2 12/20/2023 0758   AST 77 (H) 12/20/2023 0758   ALT 52 (H) 12/20/2023 0758   ALKPHOS 88 12/20/2023 0758    BILITOT 0.4 12/20/2023 0758   GFRNONAA >60 02/23/2024 0230   GFRAA >60 07/28/2017 0326   Lipase     Component Value Date/Time   LIPASE 62 (H) 08/06/2023 0240       Studies/Results: DG Chest Port 1 View Result Date: 02/23/2024 EXAM: 1 VIEW(S) XRAY OF THE CHEST 02/23/2024 04:43:00 AM COMPARISON: 02/22/2024 CLINICAL HISTORY: Pneumothorax FINDINGS: LINES, TUBES AND DEVICES: Stable right chest tube in place. LUNGS AND PLEURA: No focal pulmonary opacity. No pulmonary edema. Decreased right pleural effusion. Slightly decreased right basilar pneumothorax. HEART AND MEDIASTINUM: No acute abnormality of the cardiac and mediastinal silhouettes. BONES AND SOFT TISSUES: Right rib fractures again noted. Small right chest wall soft tissue emphysema. IMPRESSION: 1. Decreased right pleural effusion. 2. No radiographic findings of residual pneumothorax. Electronically signed by: Waddell Calk MD 02/23/2024 05:33 AM EDT RP Workstation: [id number]   DG Chest Portable 1 View Result Date: 02/22/2024 EXAM: 1 VIEW XRAY OF THE CHEST 02/22/2024 08:01:00 PM COMPARISON: Chest x-ray 02/27/2019, CT chest 02/27/2019. CLINICAL HISTORY: S/P chest tube. S/P chest tube placement. FINDINGS: LINES, TUBES AND DEVICES: Interval placement of the right chest tube with pigtail overlying the inferior lateral most aspect  of the right lower lung zone. LUNGS AND PLEURA: Interval resolution of right pneumothorax. Persistent trace right pleural effusion. No left pneumothorax or pleural effusion. No focal pulmonary opacity. No pulmonary edema. HEART AND MEDIASTINUM: No acute abnormality of the cardiac and mediastinal silhouettes. BONES AND SOFT TISSUES: Redemonstration of acute displaced posterior seventh and eight rib fracture. Right chest wall for any soft tissue emphysema. IMPRESSION: 1. Interval resolution of right pneumothorax status post right chest tube placement. 2. Trace right pleural effusion on the right. 3. Acute displaced fractures  of the right posterior seventh and eighth ribs. Electronically signed by: Morgane Naveau MD 02/22/2024 08:20 PM EDT RP Workstation: [id number]   CT CHEST ABDOMEN PELVIS W CONTRAST Result Date: 02/22/2024 CLINICAL DATA:  [place]lemens off tractor yesterday, blunt trauma EXAM: CT CHEST, ABDOMEN, AND PELVIS WITH CONTRAST TECHNIQUE: Multidetector CT imaging of the chest, abdomen and pelvis was performed following the standard protocol during bolus administration of intravenous contrast. RADIATION DOSE REDUCTION: This exam was performed according to the departmental dose-optimization program which includes automated exposure control, adjustment of the mA and/or kV according to patient size and/or use of iterative reconstruction technique. CONTRAST:  75mL OMNIPAQUE  IOHEXOL  350 MG/ML SOLN COMPARISON:  02/22/2024, 09/14/2022 FINDINGS: CT CHEST FINDINGS Cardiovascular: The heart is unremarkable without pericardial effusion. No evidence of acute vascular injury. Atherosclerosis of the aorta. Mediastinum/Nodes: There is focal short segment wall thickening of the distal thoracic esophagus, reference image 44/3. Further evaluation with nonemergent esophagram or endoscopy may be useful. Small hiatal hernia. Thyroid  and trachea are unremarkable.  No pathologic adenopathy. Lungs/Pleura: There is a right-sided pigtail pleural drainage catheter within the anterior right lower chest, with near complete resolution of the right pneumothorax seen previously. Trace residual pneumothorax remains at the right apex. A rounded area of consolidation and gas lucency within the superior segment right lower lobe measuring 3.9 x 3.2 cm reference image 25/3 likely reflects pulmonary contusion and laceration, given overlying rib fractures. The left chest is clear.  Central airways are patent. Musculoskeletal: There are acute minimally displaced right posterolateral seventh and eighth rib fractures. Subcutaneous gas within the right posterolateral chest  wall consistent with known pneumothorax and right chest tube placement. There are no other acute or destructive bony abnormalities. Reconstructed images demonstrate no additional findings. CT ABDOMEN PELVIS FINDINGS Hepatobiliary: No hepatic injury or perihepatic hematoma. Gallbladder is unremarkable. Pancreas: Unremarkable. No pancreatic ductal dilatation or surrounding inflammatory changes. Spleen: No splenic injury or perisplenic hematoma. Adrenals/Urinary Tract: No adrenal hemorrhage or renal injury identified. Bladder is unremarkable. Stomach/Bowel: No bowel obstruction or ileus. Normal retrocecal appendix. There is a small hiatal hernia, with nonspecific wall thickening of the gastric cardia and gastroesophageal junction. Vascular/Lymphatic: Aortic atherosclerosis. No enlarged abdominal or pelvic lymph nodes. Reproductive: Prostate is unremarkable. Other: No free fluid or free intraperitoneal gas. No abdominal wall hernia. Musculoskeletal: There are no acute displaced fractures. Prior healed fractures of the left superior and inferior pubic rami and left sacral ala are noted. Cannulated screws traverse the bilateral sacroiliac joints and left superior pubic ramus consistent with prior fracture repair. Stable chronic L1 compression deformity. Reconstructed images demonstrate no additional findings. IMPRESSION: 1. Near complete resolution of the right pneumothorax after interval chest tube placement. Trace residual right apical pneumothorax without tension effect. 2. Rounded area of consolidation in internal gas lucency within the superior segment right lower lobe, favor contusion and laceration given overlying rib fractures. 3. Acute minimally displaced right posterolateral seventh and eighth rib fractures. 4. Short segment circumferential mural  thickening of the distal thoracic esophagus, as well as mural thickening of the gastric cardia and gastroesophageal junction. Further evaluation with nonemergent  esophagram or endoscopy is recommended to assess for underlying mass. 5.  Aortic Atherosclerosis (ICD10-I70.0). Electronically Signed   By: Ozell Daring M.D.   On: 02/22/2024 19:32   CT Head Wo Contrast Result Date: 02/22/2024 EXAM: CT HEAD AND CERVICAL SPINE 02/22/2024 07:17:56 PM TECHNIQUE: CT of the head and cervical spine was performed without the administration of intravenous contrast. Multiplanar reformatted images are provided for review. Automated exposure control, iterative reconstruction, and/or weight based adjustment of the mA/kV was utilized to reduce the radiation dose to as low as reasonably achievable. COMPARISON: CT head and c-spine 07/02/2023. CLINICAL HISTORY: Blunt trauma. FINDINGS: CT HEAD BRAIN AND VENTRICLES: No acute intracranial hemorrhage. No mass effect or midline shift. No abnormal extra-axial fluid collection. No evidence of acute infarct. No hydrocephalus. Atherosclerotic calcifications are present within the cavernous internal carotid arteries. Left inferior frontal lobe encephalomalacia. ORBITS: No acute abnormality. SINUSES AND MASTOIDS:N No acute abnormality. SOFT TISSUES AND SKULL: No acute skull fracture. CT CERVICAL SPINE BONES AND ALIGNMENT: No acute fracture or traumatic malalignment. DEGENERATIVE CHANGES: Multilevel moderate degenerative changes of the spine. No associated severe osseous neural foraminal or central canal stenosis. Moderate osseous neural foraminal stenosis noted at the right C5-C6 level. SOFT TISSUES: No prevertebral soft tissue swelling. Superior soft tissue emphysema along the right back soft tissues noted. INCIDENTAL CHEST FINDINGS: At least small right apical pneumothorax - please see separately dictated CT chest 1025. Right azygous fissure noted. IMPRESSION: 1. At least small volume right apical pneumothorax and superior soft tissue emphysema along the right back soft tissues, correlate with separately dictated chest CT findings. 2. No acute  intracranial abnormality. 3. No acute cervical spine fracture or traumatic malalignment. Electronically signed by: Morgane Naveau MD 02/22/2024 07:29 PM EDT RP Workstation: [id number]   CT Cervical Spine Wo Contrast Result Date: 02/22/2024 EXAM: CT HEAD AND CERVICAL SPINE 02/22/2024 07:17:56 PM TECHNIQUE: CT of the head and cervical spine was performed without the administration of intravenous contrast. Multiplanar reformatted images are provided for review. Automated exposure control, iterative reconstruction, and/or weight based adjustment of the mA/kV was utilized to reduce the radiation dose to as low as reasonably achievable. COMPARISON: CT head and c-spine 07/02/2023. CLINICAL HISTORY: Blunt trauma. FINDINGS: CT HEAD BRAIN AND VENTRICLES: No acute intracranial hemorrhage. No mass effect or midline shift. No abnormal extra-axial fluid collection. No evidence of acute infarct. No hydrocephalus. Atherosclerotic calcifications are present within the cavernous internal carotid arteries. Left inferior frontal lobe encephalomalacia. ORBITS: No acute abnormality. SINUSES AND MASTOIDS:N No acute abnormality. SOFT TISSUES AND SKULL: No acute skull fracture. CT CERVICAL SPINE BONES AND ALIGNMENT: No acute fracture or traumatic malalignment. DEGENERATIVE CHANGES: Multilevel moderate degenerative changes of the spine. No associated severe osseous neural foraminal or central canal stenosis. Moderate osseous neural foraminal stenosis noted at the right C5-C6 level. SOFT TISSUES: No prevertebral soft tissue swelling. Superior soft tissue emphysema along the right back soft tissues noted. INCIDENTAL CHEST FINDINGS: At least small right apical pneumothorax - please see separately dictated CT chest 1025. Right azygous fissure noted. IMPRESSION: 1. At least small volume right apical pneumothorax and superior soft tissue emphysema along the right back soft tissues, correlate with separately dictated chest CT findings. 2. No  acute intracranial abnormality. 3. No acute cervical spine fracture or traumatic malalignment. Electronically signed by: Morgane Naveau MD 02/22/2024 07:29 PM EDT RP Workstation: [id number]  DG Chest Port 1 View Result Date: 02/22/2024 CLINICAL DATA:  355200 Chest pain 355200 EXAM: PORTABLE CHEST - 1 VIEW COMPARISON:  10/06/2023 FINDINGS: Coarsening of the pulmonary interstitium with relative flattening of the diaphragms, suggesting emphysema. Moderate volume sub pulmonic pneumothorax with a small amount of subcutaneous emphysema along the right chest wall. Rounded opacity overlying the medial right lung base measuring 2.8 cm. No pleural effusion. No cardiomegaly.Mildly displaced, 6 and seventh rib fractures. Multilevel thoracic osteophytosis. IMPRESSION: 1. Moderate volume subpulmonic right pneumothorax with a small amount of subcutaneous emphysema. No mediastinal shift to suggest tension pathology. 2. Rounded opacity overlying the medial right lung base, measuring 2.8 cm, which is indeterminate, and could represent an external structure to the patient. Correlation with physical exam findings recommended. Nonemergent chest CT with IV contrast should also be considered. 3. Mildly displaced, posterior right 6 and 7 rib fractures. Critical Value/emergent results were called by telephone at the time of interpretation on 02/22/2024 at 6:27 pm to provider LAVONIA PAT MD, who verbally acknowledged these results. Electronically Signed   By: Rogelia Myers M.D.   On: 02/22/2024 18:30    Anti-infectives: Anti-infectives (From admission, onward)    None        Assessment/Plan Fall out of semi R 7-8 rib fxs with R PTX - R CT to -20 cm, CXR this AM stable, continue sxn today and possibly WS tomorrow, IS, multimodal pain control Alcohol  abuse with hx of alcohol  withdrawal - CIWA, add beer TID with meals    FEN: reg diet, SLIV VTE: LMWH ID: no current abx  Dispo: 3W, R CT management. Add beer TID to  help prevent withdrawal    LOS: 1 day   I reviewed ED provider notes, last 24 h vitals and pain scores, last 48 h intake and output, last 24 h labs and trends, and last 24 h imaging results.  This care required moderate level of medical decision making.    Burnard JONELLE Louder, [facility] Surgery 02/23/2024, 8:16 AM Please see Amion for pager number during day hours 7:00am-4:30pm
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
## 2024-02-23 NOTE — Evaluation (Signed)
 Occupational Therapy Evaluation Patient Details Name: Gary Mclaughlin MRN: [id number] DOB: 1970/09/20 Today's Date: 02/23/2024   History of Present Illness   Pt is a 53yo male who fell out of a semi truck and suffered R 7-8 rib fx and R pneumothorax requiring chest tube and 6Lo2 supplemental O2. PMH: homeless, ETOH abuse, depression, seizures, SAH, tobacco abuse PSH: L ORIF pelvic fx with percutaneous screws 04/2022     Clinical Impressions PTA pt lives in a tent however states he was sleeping in a semi trailer when he fell out of it. 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 Pt required min A for mobility and ADL tasks due to below listed deficits. Educated on use of incentive spirometer and pt was able to pull 1250 ml. Asked the nursing secretary to find a recliner for the pt's room so he can increase time upright OOB. At this time.feel Patient will benefit from continued inpatient follow up therapy, <3 hours/day to maximize functional level of independence. Acute OT will follow and update POC as pt progresses. VSS on 6L; c/o SOB with ADL/mobility .      If plan is discharge home, recommend the following:   A little help with walking and/or transfers;A little help with bathing/dressing/bathroom     Functional Status Assessment   Patient has had a recent decline in their functional status and demonstrates the ability to make significant improvements in function in a reasonable and predictable amount of time.     Equipment Recommendations   None recommended by OT     Recommendations for Other Services         Precautions/Restrictions   Precautions Precautions: Fall Precaution/Restrictions Comments: R chest tube Restrictions Weight Bearing Restrictions Per Provider Order: No     Mobility Bed Mobility               General bed mobility comments: Pt sitting EOB    Transfers Overall transfer level: Needs assistance   Transfers: Sit  to/from Stand Sit to Stand: Min assist                  Balance Overall balance assessment: Mild deficits observed, not formally tested (requires use of RW for safe amb at this time)                                         ADL either performed or assessed with clinical judgement   ADL Overall ADL's : Needs assistance/impaired Eating/Feeding: Independent   Grooming: Set up;Sitting   Upper Body Bathing: Minimal assistance;Sitting   Lower Body Bathing: Minimal assistance;Sit to/from stand   Upper Body Dressing : Minimal assistance;Sitting   Lower Body Dressing: Minimal assistance;Sit to/from stand   Toilet Transfer: Minimal assistance   Toileting- Clothing Manipulation and Hygiene: Minimal assistance       Functional mobility during ADLs: Minimal assistance General ADL Comments: Able to complete figure four position     Vision Baseline Vision/History: 1 Wears glasses (readers)       Perception         Praxis         Pertinent Vitals/Pain Pain Assessment Pain Assessment: 0-10 Pain Score: 8  Pain Location: R flank/chest Pain Descriptors / Indicators: Sharp Pain Intervention(s): Limited activity within patient's tolerance     Extremity/Trunk Assessment Upper Extremity Assessment Upper Extremity Assessment: RUE deficits/detail;Left hand dominant RUE  Deficits / Details: painful however able to demonstrate functional ROM; strength not tested due to pain RUE Coordination: decreased gross motor   Lower Extremity Assessment Lower Extremity Assessment: Defer to PT evaluation   Cervical / Trunk Assessment Cervical / Trunk Assessment: Other exceptions Cervical / Trunk Exceptions: R chest tube; R rib fxs   Communication Communication Communication: No apparent difficulties   Cognition Arousal: Alert Behavior During Therapy: WFL for tasks assessed/performed Cognition: No family/caregiver present to determine baseline, No apparent  impairments                               Following commands: Intact       Cueing  General Comments   Cueing Techniques: Verbal cues  R chest tube   Exercises Exercises: Other exercises Other Exercises Other Exercises: incentive spirometer x 10 - able to pull 1250 ml Other Exercises: educated about splinting when coughing to help with pain   Shoulder Instructions      Home Living Family/patient expects to be discharged to:: Shelter/Homeless                                 Additional Comments: pt reports staying in a tent/semi trailer Uses the [facility] for showers; uses the bathroom outside; goes to a laundrymat for clothes      Prior Functioning/Environment Prior Level of Function : Independent/Modified Independent;Working/employed (intermittently works as a Education administrator) - currently working                     OT Problem List: Decreased activity tolerance;Decreased safety awareness;Decreased knowledge of use of DME or AE;Cardiopulmonary status limiting activity;Pain   OT Treatment/Interventions: Self-care/ADL training;Therapeutic exercise;Energy conservation;DME and/or AE instruction;Therapeutic activities;Patient/family education      OT Goals(Current goals can be found in the care plan section)   Acute Rehab OT Goals Patient Stated Goal: get better; less pain OT Goal Formulation: With patient Time For Goal Achievement: 03/08/24 Potential to Achieve Goals: Good   OT Frequency:  Min 2X/week    Co-evaluation              AM-PAC OT 6 Clicks Daily Activity     Outcome Measure Help from another person eating meals?: None Help from another person taking care of personal grooming?: A Little Help from another person toileting, which includes using toliet, bedpan, or urinal?: A Little Help from another person bathing (including washing, rinsing, drying)?: A Little Help from another person to put on and taking off regular upper body  clothing?: A Little Help from another person to put on and taking off regular lower body clothing?: A Little 6 Click Score: 19   End of Session Equipment Utilized During Treatment: Oxygen (6L) Nurse Communication: Mobility status  Activity Tolerance: Patient tolerated treatment well Patient left: in bed;with call bell/phone within reach  OT Visit Diagnosis: Unsteadiness on feet (R26.81);Pain Pain - Right/Left: Right Pain - part of body:  (chest)                Time: 8593-8576 OT Time Calculation (min): 17 min Charges:  OT General Charges $OT Visit: 1 Visit OT Evaluation $OT Eval Low Complexity: 1 Low  Vic Esco, OT/L   Acute OT Clinical Specialist Acute Rehabilitation Services Pager [phone] Office [phone]   [facility] 02/23/2024, 2:48 PM
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
## 2024-02-24 ENCOUNTER — Inpatient Hospital Stay (HOSPITAL_COMMUNITY): Payer: MEDICAID

## 2024-02-24 MED ORDER — ALBUTEROL SULFATE (2.5 MG/3ML) 0.083% IN NEBU: 2.5000 mg | INHALATION_SOLUTION | Freq: Four times a day (QID) | RESPIRATORY_TRACT | Status: DC | PRN

## 2024-02-24 MED ORDER — LORATADINE 10 MG PO TABS
10.0000 mg | ORAL_TABLET | Freq: Every day | ORAL | Status: DC
  Administered 2024-02-24 – 2024-02-26 (×3): 10 mg via ORAL
  Filled 2024-02-24 (×3): qty 1

## 2024-02-24 MED ORDER — ALBUTEROL SULFATE (2.5 MG/3ML) 0.083% IN NEBU
2.5000 mg | INHALATION_SOLUTION | Freq: Two times a day (BID) | RESPIRATORY_TRACT | Status: DC
  Administered 2024-02-24 – 2024-02-25 (×2): 2.5 mg via RESPIRATORY_TRACT
  Filled 2024-02-24 (×2): qty 3

## 2024-02-24 MED ORDER — NICOTINE 14 MG/24HR TD PT24
14.0000 mg | MEDICATED_PATCH | Freq: Every day | TRANSDERMAL | Status: DC
  Administered 2024-02-24 – 2024-02-26 (×3): 14 mg via TRANSDERMAL
  Filled 2024-02-24 (×3): qty 1

## 2024-02-24 MED ORDER — SODIUM CHLORIDE 1 G PO TABS
1.0000 g | ORAL_TABLET | Freq: Three times a day (TID) | ORAL | Status: DC
  Administered 2024-02-24 – 2024-02-25 (×3): 1 g via ORAL
  Filled 2024-02-24 (×3): qty 1

## 2024-02-24 MED ORDER — ALBUTEROL SULFATE (2.5 MG/3ML) 0.083% IN NEBU
2.5000 mg | INHALATION_SOLUTION | Freq: Four times a day (QID) | RESPIRATORY_TRACT | Status: DC
  Administered 2024-02-24: 2.5 mg via RESPIRATORY_TRACT
  Filled 2024-02-24: qty 3

## 2024-02-24 MED ORDER — GUAIFENESIN ER 600 MG PO TB12
600.0000 mg | ORAL_TABLET | Freq: Two times a day (BID) | ORAL | Status: DC
  Administered 2024-02-24 – 2024-02-26 (×5): 600 mg via ORAL
  Filled 2024-02-24 (×5): qty 1

## 2024-02-24 MED ORDER — LIDOCAINE 5 % EX PTCH
1.0000 | MEDICATED_PATCH | CUTANEOUS | Status: DC
  Administered 2024-02-24 – 2024-02-26 (×3): 1 via TRANSDERMAL
  Filled 2024-02-24 (×3): qty 1

## 2024-02-24 MED ORDER — HYDROXYZINE HCL 10 MG PO TABS
10.0000 mg | ORAL_TABLET | Freq: Three times a day (TID) | ORAL | Status: DC | PRN
  Administered 2024-02-24 – 2024-02-25 (×2): 10 mg via ORAL
  Filled 2024-02-24 (×2): qty 1

## 2024-02-24 NOTE — Progress Notes (Signed)
 Progress Note     Subjective: Pt comfortable this AM, having some breakfast. Feeling some anxiety which he attributes to withdrawal but managing without additional medications. He is coughing up some phlegm but reports he would like his home inhaler to help this. Generally smokes about 1 PPD.   Objective: Vital signs in last 24 hours: Temp:  [97.6 F (36.4 C)-98.4 F (36.9 C)] 98.1 F (36.7 C) (10/22 0816) Pulse Rate:  [64-82] 82 (10/22 0816) Resp:  [16-19] 16 (10/22 0816) BP: (124-152)/(82-98) 124/82 (10/22 0816) SpO2:  [92 %-100 %] 92 % (10/22 0816) Last BM Date : 02/22/24  Intake/Output from previous day: 10/21 0701 - 10/22 0700 In: -  Out: 1044 [Urine:950; Chest Tube:94] Intake/Output this shift: No intake/output data recorded.  PE: General: pleasant, WD, WN male who is laying in bed in NAD Heart: regular, rate, and rhythm.   Lungs: CTAB, no wheezes, rhonchi, or rales noted.  Respiratory effort nonlabored, R CT in place w/o air leak, SS fluid in [place] Abd: soft, NT, ND MS: all 4 extremities are symmetrical with no cyanosis, clubbing, or edema. Psych: Alert, a little sedated but able to answer questions appropriately   Lab Results:  Recent Labs    02/22/24 2335 02/23/24 0230  WBC 7.9 7.4  HGB 11.6* 11.3*  HCT 34.6* 33.5*  PLT 426* 385   BMET Recent Labs    02/22/24 1731 02/22/24 2335 02/23/24 0230  NA 127*  --  128*  K 4.9  --  4.7  CL 91*  --  93*  CO2 23  --  27  GLUCOSE 94  --  95  BUN <5*  --  6  CREATININE 0.53* 0.59* 0.64  CALCIUM 9.5  --  9.5   PT/INR No results for input(s): LABPROT, INR in the last 72 hours. CMP     Component Value Date/Time   NA 128 (L) 02/23/2024 0230   K 4.7 02/23/2024 0230   CL 93 (L) 02/23/2024 0230   CO2 27 02/23/2024 0230   GLUCOSE 95 02/23/2024 0230   BUN 6 02/23/2024 0230   CREATININE 0.64 02/23/2024 0230   CALCIUM 9.5 02/23/2024 0230   PROT 8.0 12/20/2023 0758   ALBUMIN 4.2 12/20/2023 0758   AST  77 (H) 12/20/2023 0758   ALT 52 (H) 12/20/2023 0758   ALKPHOS 88 12/20/2023 0758   BILITOT 0.4 12/20/2023 0758   GFRNONAA >60 02/23/2024 0230   GFRAA >60 07/28/2017 0326   Lipase     Component Value Date/Time   LIPASE 62 (H) 08/06/2023 0240       Studies/Results: DG CHEST PORT 1 VIEW Result Date: 02/24/2024 CLINICAL DATA:  Right-sided chest tube.  Evaluate pneumothorax. EXAM: PORTABLE CHEST 1 VIEW COMPARISON:  02/23/2024. FINDINGS: Right lateral basilar chest tube unchanged. No right-sided pneumothorax visualized. Lungs are otherwise clear. Cardiomediastinal silhouette is normal. Evidence of patient's right posterior rib fractures. IMPRESSION: 1. No acute cardiopulmonary disease. 2. Right lateral basilar chest tube unchanged. No pneumothorax visualized. Electronically Signed   By: Toribio Agreste M.D.   On: 02/24/2024 07:54   DG Chest Port 1 View Result Date: 02/23/2024 EXAM: 1 VIEW(S) XRAY OF THE CHEST 02/23/2024 04:43:00 AM COMPARISON: 02/22/2024 CLINICAL HISTORY: Pneumothorax FINDINGS: LINES, TUBES AND DEVICES: Stable right chest tube in place. LUNGS AND PLEURA: No focal pulmonary opacity. No pulmonary edema. Decreased right pleural effusion. Slightly decreased right basilar pneumothorax. HEART AND MEDIASTINUM: No acute abnormality of the cardiac and mediastinal silhouettes. BONES AND SOFT TISSUES: Right  rib fractures again noted. Small right chest wall soft tissue emphysema. IMPRESSION: 1. Decreased right pleural effusion. 2. No radiographic findings of residual pneumothorax. Electronically signed by: Waddell Calk MD 02/23/2024 05:33 AM EDT RP Workstation: [id number]   DG Chest Portable 1 View Result Date: 02/22/2024 EXAM: 1 VIEW XRAY OF THE CHEST 02/22/2024 08:01:00 PM COMPARISON: Chest x-ray 02/27/2019, CT chest 02/27/2019. CLINICAL HISTORY: S/P chest tube. S/P chest tube placement. FINDINGS: LINES, TUBES AND DEVICES: Interval placement of the right chest tube with pigtail overlying  the inferior lateral most aspect of the right lower lung zone. LUNGS AND PLEURA: Interval resolution of right pneumothorax. Persistent trace right pleural effusion. No left pneumothorax or pleural effusion. No focal pulmonary opacity. No pulmonary edema. HEART AND MEDIASTINUM: No acute abnormality of the cardiac and mediastinal silhouettes. BONES AND SOFT TISSUES: Redemonstration of acute displaced posterior seventh and eight rib fracture. Right chest wall for any soft tissue emphysema. IMPRESSION: 1. Interval resolution of right pneumothorax status post right chest tube placement. 2. Trace right pleural effusion on the right. 3. Acute displaced fractures of the right posterior seventh and eighth ribs. Electronically signed by: Morgane Naveau MD 02/22/2024 08:20 PM EDT RP Workstation: [id number]   CT CHEST ABDOMEN PELVIS W CONTRAST Result Date: 02/22/2024 CLINICAL DATA:  [place] off tractor yesterday, blunt trauma EXAM: CT CHEST, ABDOMEN, AND PELVIS WITH CONTRAST TECHNIQUE: Multidetector CT imaging of the chest, abdomen and pelvis was performed following the standard protocol during bolus administration of intravenous contrast. RADIATION DOSE REDUCTION: This exam was performed according to the departmental dose-optimization program which includes automated exposure control, adjustment of the mA and/or kV according to patient size and/or use of iterative reconstruction technique. CONTRAST:  75mL OMNIPAQUE  IOHEXOL  350 MG/ML SOLN COMPARISON:  02/22/2024, 09/14/2022 FINDINGS: CT CHEST FINDINGS Cardiovascular: The heart is unremarkable without pericardial effusion. No evidence of acute vascular injury. Atherosclerosis of the aorta. Mediastinum/Nodes: There is focal short segment wall thickening of the distal thoracic esophagus, reference image 44/3. Further evaluation with nonemergent esophagram or endoscopy may be useful. Small hiatal hernia. Thyroid  and trachea are unremarkable.  No pathologic adenopathy.  Lungs/Pleura: There is a right-sided pigtail pleural drainage catheter within the anterior right lower chest, with near complete resolution of the right pneumothorax seen previously. Trace residual pneumothorax remains at the right apex. A rounded area of consolidation and gas lucency within the superior segment right lower lobe measuring 3.9 x 3.2 cm reference image 25/3 likely reflects pulmonary contusion and laceration, given overlying rib fractures. The left chest is clear.  Central airways are patent. Musculoskeletal: There are acute minimally displaced right posterolateral seventh and eighth rib fractures. Subcutaneous gas within the right posterolateral chest wall consistent with known pneumothorax and right chest tube placement. There are no other acute or destructive bony abnormalities. Reconstructed images demonstrate no additional findings. CT ABDOMEN PELVIS FINDINGS Hepatobiliary: No hepatic injury or perihepatic hematoma. Gallbladder is unremarkable. Pancreas: Unremarkable. No pancreatic ductal dilatation or surrounding inflammatory changes. Spleen: No splenic injury or perisplenic hematoma. Adrenals/Urinary Tract: No adrenal hemorrhage or renal injury identified. Bladder is unremarkable. Stomach/Bowel: No bowel obstruction or ileus. Normal retrocecal appendix. There is a small hiatal hernia, with nonspecific wall thickening of the gastric cardia and gastroesophageal junction. Vascular/Lymphatic: Aortic atherosclerosis. No enlarged abdominal or pelvic lymph nodes. Reproductive: Prostate is unremarkable. Other: No free fluid or free intraperitoneal gas. No abdominal wall hernia. Musculoskeletal: There are no acute displaced fractures. Prior healed fractures of the left superior and inferior pubic rami and left sacral  ala are noted. Cannulated screws traverse the bilateral sacroiliac joints and left superior pubic ramus consistent with prior fracture repair. Stable chronic L1 compression deformity.  Reconstructed images demonstrate no additional findings. IMPRESSION: 1. Near complete resolution of the right pneumothorax after interval chest tube placement. Trace residual right apical pneumothorax without tension effect. 2. Rounded area of consolidation in internal gas lucency within the superior segment right lower lobe, favor contusion and laceration given overlying rib fractures. 3. Acute minimally displaced right posterolateral seventh and eighth rib fractures. 4. Short segment circumferential mural thickening of the distal thoracic esophagus, as well as mural thickening of the gastric cardia and gastroesophageal junction. Further evaluation with nonemergent esophagram or endoscopy is recommended to assess for underlying mass. 5.  Aortic Atherosclerosis (ICD10-I70.0). Electronically Signed   By: Ozell Daring M.D.   On: 02/22/2024 19:32   CT Head Wo Contrast Result Date: 02/22/2024 EXAM: CT HEAD AND CERVICAL SPINE 02/22/2024 07:17:56 PM TECHNIQUE: CT of the head and cervical spine was performed without the administration of intravenous contrast. Multiplanar reformatted images are provided for review. Automated exposure control, iterative reconstruction, and/or weight based adjustment of the mA/kV was utilized to reduce the radiation dose to as low as reasonably achievable. COMPARISON: CT head and c-spine 07/02/2023. CLINICAL HISTORY: Blunt trauma. FINDINGS: CT HEAD BRAIN AND VENTRICLES: No acute intracranial hemorrhage. No mass effect or midline shift. No abnormal extra-axial fluid collection. No evidence of acute infarct. No hydrocephalus. Atherosclerotic calcifications are present within the cavernous internal carotid arteries. Left inferior frontal lobe encephalomalacia. ORBITS: No acute abnormality. SINUSES AND MASTOIDS:N No acute abnormality. SOFT TISSUES AND SKULL: No acute skull fracture. CT CERVICAL SPINE BONES AND ALIGNMENT: No acute fracture or traumatic malalignment. DEGENERATIVE CHANGES:  Multilevel moderate degenerative changes of the spine. No associated severe osseous neural foraminal or central canal stenosis. Moderate osseous neural foraminal stenosis noted at the right C5-C6 level. SOFT TISSUES: No prevertebral soft tissue swelling. Superior soft tissue emphysema along the right back soft tissues noted. INCIDENTAL CHEST FINDINGS: At least small right apical pneumothorax - please see separately dictated CT chest 1025. Right azygous fissure noted. IMPRESSION: 1. At least small volume right apical pneumothorax and superior soft tissue emphysema along the right back soft tissues, correlate with separately dictated chest CT findings. 2. No acute intracranial abnormality. 3. No acute cervical spine fracture or traumatic malalignment. Electronically signed by: Morgane Naveau MD 02/22/2024 07:29 PM EDT RP Workstation: [id number]   CT Cervical Spine Wo Contrast Result Date: 02/22/2024 EXAM: CT HEAD AND CERVICAL SPINE 02/22/2024 07:17:56 PM TECHNIQUE: CT of the head and cervical spine was performed without the administration of intravenous contrast. Multiplanar reformatted images are provided for review. Automated exposure control, iterative reconstruction, and/or weight based adjustment of the mA/kV was utilized to reduce the radiation dose to as low as reasonably achievable. COMPARISON: CT head and c-spine 07/02/2023. CLINICAL HISTORY: Blunt trauma. FINDINGS: CT HEAD BRAIN AND VENTRICLES: No acute intracranial hemorrhage. No mass effect or midline shift. No abnormal extra-axial fluid collection. No evidence of acute infarct. No hydrocephalus. Atherosclerotic calcifications are present within the cavernous internal carotid arteries. Left inferior frontal lobe encephalomalacia. ORBITS: No acute abnormality. SINUSES AND MASTOIDS:N No acute abnormality. SOFT TISSUES AND SKULL: No acute skull fracture. CT CERVICAL SPINE BONES AND ALIGNMENT: No acute fracture or traumatic malalignment. DEGENERATIVE  CHANGES: Multilevel moderate degenerative changes of the spine. No associated severe osseous neural foraminal or central canal stenosis. Moderate osseous neural foraminal stenosis noted at the right C5-C6 level. SOFT  TISSUES: No prevertebral soft tissue swelling. Superior soft tissue emphysema along the right back soft tissues noted. INCIDENTAL CHEST FINDINGS: At least small right apical pneumothorax - please see separately dictated CT chest 1025. Right azygous fissure noted. IMPRESSION: 1. At least small volume right apical pneumothorax and superior soft tissue emphysema along the right back soft tissues, correlate with separately dictated chest CT findings. 2. No acute intracranial abnormality. 3. No acute cervical spine fracture or traumatic malalignment. Electronically signed by: Morgane Naveau MD 02/22/2024 07:29 PM EDT RP Workstation: [id number]   DG Chest Port 1 View Result Date: 02/22/2024 CLINICAL DATA:  [id number] Chest pain [id number] EXAM: PORTABLE CHEST - 1 VIEW COMPARISON:  10/06/2023 FINDINGS: Coarsening of the pulmonary interstitium with relative flattening of the diaphragms, suggesting emphysema. Moderate volume sub pulmonic pneumothorax with a small amount of subcutaneous emphysema along the right chest wall. Rounded opacity overlying the medial right lung base measuring 2.8 cm. No pleural effusion. No cardiomegaly.Mildly displaced, 6 and seventh rib fractures. Multilevel thoracic osteophytosis. IMPRESSION: 1. Moderate volume subpulmonic right pneumothorax with a small amount of subcutaneous emphysema. No mediastinal shift to suggest tension pathology. 2. Rounded opacity overlying the medial right lung base, measuring 2.8 cm, which is indeterminate, and could represent an external structure to the patient. Correlation with physical exam findings recommended. Nonemergent chest CT with IV contrast should also be considered. 3. Mildly displaced, posterior right 6 and 7 rib fractures. Critical  Value/emergent results were called by telephone at the time of interpretation on 02/22/2024 at 6:27 pm to provider LAVONIA PAT MD, who verbally acknowledged these results. Electronically Signed   By: Rogelia Myers M.D.   On: 02/22/2024 18:30    Anti-infectives: Anti-infectives (From admission, onward)    None        Assessment/Plan Fall out of semi R 7-8 rib fxs with R PTX - R CT to WS, CXR this AM stable, repeat CXR in AM to evaluate for possible CT removal, IS, multimodal pain control Alcohol  abuse with hx of alcohol  withdrawal - CIWA, beer TID with meals and phenobarb taper Hyponatremia - Na 128 yesterday, suspect in setting of above. Salt tabs and repeat labs in AM Tobacco abuse - add nicotine  patch  FEN: reg diet, SLIV VTE: LMWH ID: no current abx  Dispo: 3W, R CT management. Continue therapies    LOS: 2 days   I reviewed ED provider notes, last 24 h vitals and pain scores, last 48 h intake and output, last 24 h labs and trends, and last 24 h imaging results.  This care required moderate level of medical decision making.    Burnard JONELLE Louder, [facility] Surgery 02/24/2024, 8:29 AM Please see Amion for pager number during day hours 7:00am-4:30pm

## 2024-02-24 NOTE — TOC Progression Note (Signed)
 Transition of Care [facility]) - Progression Note    Patient Details  Name: Gary Mclaughlin MRN: [id number] Date of Birth: March 10, 1971  [facility] CM/SW Contact  Raliegh Scobie M, RN Phone Number: 02/24/2024, 2:00 PM  Clinical Narrative:    Noted patient faxed out yesterday for SNF bed search.  Currently no bed offers; will provide updates as available.    Expected Discharge Plan: Skilled Nursing Facility Barriers to Discharge: Continued Medical Work up               Expected Discharge Plan and Services       Living arrangements for the past 2 months: Homeless                                       Social Drivers of Health (SDOH) Interventions SDOH Screenings   Food Insecurity: Low Risk  (10/19/2023)   Received from [facility]  Housing: Low Risk  (10/19/2023)   Received from [facility]  Recent Concern: Housing - Medium Risk (10/05/2023)   Received from [facility]  Transportation Needs: No Transportation Needs (10/19/2023)   Received from [facility]  Utilities: Low Risk  (10/19/2023)   Received from [facility]  Alcohol  Screen: High Risk (04/10/2023)  Depression (PHQ2-9): Medium Risk (04/14/2023)  Tobacco Use: Medium Risk (01/19/2024)    Readmission Risk Interventions     No data to display         Emilynn Srinivasan W. Carman Auxier, RN, BSN  Trauma/Neuro ICU Case Manager [phone]

## 2024-02-24 NOTE — Progress Notes (Signed)
 Physical Therapy Treatment Patient Details Name: Gary Mclaughlin MRN: [id number] DOB: March 14, 1971 Today's Date: 02/24/2024   History of Present Illness Pt is a 53yo male who fell out of a semi truck and suffered R 7-8 rib fx and R pneumothorax requiring chest tube and 6Lo2 supplemental O2. PMH: homeless, ETOH abuse, depression, seizures, SAH, tobacco abuse PSH: L ORIF pelvic fx with percutaneous screws 04/2022   PT Comments  Pt received in supine and eager to get out of the bed. Pt progressed by being able to stand with CGA and no AD. Pt was also able to ambulate for short distances at a time with CGA/MinA and no AD. Pt had a tendency to veer right/left with instability noted when turning head. Discussed splinting when coughing/mobilizing, energy conservation techniques, and progressive mobility. To safely return to home, pt would need to be ModI for all mobility and be able to perform a floor<>stand transfer due to living in a tent. At this time, pt would still benefit from <3hrs post acute rehab with acute PT to follow.   SpO2 93-94% on RA during gait    If plan is discharge home, recommend the following: A little help with walking and/or transfers;A lot of help with bathing/dressing/bathroom;Assist for transportation   Can travel by private vehicle     Yes  Equipment Recommendations  Rolling walker (2 wheels)       Precautions / Restrictions Precautions Precautions: Fall Precaution/Restrictions Comments: R chest tube, R rib fx 7-8 Restrictions Weight Bearing Restrictions Per Provider Order: No     Mobility  Bed Mobility Overal bed mobility: Modified Independent    General bed mobility comments: moved into long-sitting with pt able to pivot to sitting on EOB    Transfers Overall transfer level: Needs assistance Equipment used: None Transfers: Sit to/from Stand Sit to Stand: Contact guard assist    General transfer comment: CGA for safety    Ambulation/Gait Ambulation/Gait  assistance: Contact guard assist, Min assist Gait Distance (Feet): 200 Feet (x4)   Gait Pattern/deviations: Step-through pattern, Decreased stride length, Trunk flexed Gait velocity: dec     General Gait Details: slightly unsteady gait with no AD with pt veering right/left. Increased instability noted with head turns      Balance Overall balance assessment: Mild deficits observed, not formally tested     Communication Communication Communication: No apparent difficulties  Cognition Arousal: Alert Behavior During Therapy: WFL for tasks assessed/performed   PT - Cognitive impairments: No apparent impairments    Following commands: Intact      Cueing Cueing Techniques: Verbal cues  Exercises Other Exercises Other Exercises: incentive spirometer x3, pulling 1500 ml        Pertinent Vitals/Pain Pain Assessment Pain Assessment: Faces Faces Pain Scale: Hurts whole lot Pain Location: R ribs and chest tube insertion Pain Descriptors / Indicators: Sharp, Discomfort, Grimacing Pain Intervention(s): Limited activity within patient's tolerance, Monitored during session, Repositioned     PT Goals (current goals can now be found in the care plan section) Acute Rehab PT Goals PT Goal Formulation: With patient Time For Goal Achievement: 03/08/24 Potential to Achieve Goals: Good Progress towards PT goals: Progressing toward goals    Frequency    Min 3X/week       AM-PAC PT 6 Clicks Mobility   Outcome Measure  Help needed turning from your back to your side while in a flat bed without using bedrails?: A Little Help needed moving from lying on your back to sitting on the side  of a flat bed without using bedrails?: A Little Help needed moving to and from a bed to a chair (including a wheelchair)?: A Little Help needed standing up from a chair using your arms (e.g., wheelchair or bedside chair)?: A Little Help needed to walk in hospital room?: A Little Help needed  climbing 3-5 steps with a railing? : A Lot 6 Click Score: 17    End of Session   Activity Tolerance: Patient tolerated treatment well Patient left: in bed;with call bell/phone within reach;with nursing/sitter in room (sitting EOB with RN present) Nurse Communication: Mobility status PT Visit Diagnosis: Unsteadiness on feet (R26.81)     Time: 8493-8475 PT Time Calculation (min) (ACUTE ONLY): 18 min  Charges:    $Gait Training: 8-22 mins PT General Charges $$ ACUTE PT VISIT: 1 Visit                    Kate ORN, PT, DPT Secure Chat Preferred  Rehab Office [phone]   Kate BRAVO Wendolyn 02/24/2024, 3:32 PM

## 2024-02-25 ENCOUNTER — Inpatient Hospital Stay (HOSPITAL_COMMUNITY): Payer: MEDICAID

## 2024-02-25 LAB — CBC
HCT: 32.2 % — ABNORMAL LOW (ref 39.0–52.0)
Hemoglobin: 10.7 g/dL — ABNORMAL LOW (ref 13.0–17.0)
MCH: 30.1 pg (ref 26.0–34.0)
MCHC: 33.2 g/dL (ref 30.0–36.0)
MCV: 90.7 fL (ref 80.0–100.0)
Platelets: 396 K/uL (ref 150–400)
RBC: 3.55 MIL/uL — ABNORMAL LOW (ref 4.22–5.81)
RDW: 18.5 % — ABNORMAL HIGH (ref 11.5–15.5)
WBC: 7.8 K/uL (ref 4.0–10.5)
nRBC: 0 % (ref 0.0–0.2)

## 2024-02-25 LAB — BASIC METABOLIC PANEL WITH GFR
Anion gap: 8 (ref 5–15)
BUN: 12 mg/dL (ref 6–20)
CO2: 28 mmol/L (ref 22–32)
Calcium: 9.2 mg/dL (ref 8.9–10.3)
Chloride: 95 mmol/L — ABNORMAL LOW (ref 98–111)
Creatinine, Ser: 0.71 mg/dL (ref 0.61–1.24)
GFR, Estimated: >60 mL/min (ref >60)
Glucose, Bld: 95 mg/dL (ref 70–99)
Potassium: 4.5 mmol/L (ref 3.5–5.1)
Sodium: 131 mmol/L — ABNORMAL LOW (ref 135–145)

## 2024-02-25 MED ORDER — ALBUTEROL SULFATE HFA 108 (90 BASE) MCG/ACT IN AERS
2.0000 | INHALATION_SPRAY | Freq: Two times a day (BID) | RESPIRATORY_TRACT | Status: DC
  Administered 2024-02-25: 2 via RESPIRATORY_TRACT
  Filled 2024-02-25: qty 6.7

## 2024-02-25 MED ORDER — SODIUM CHLORIDE 1 G PO TABS
1.0000 g | ORAL_TABLET | Freq: Two times a day (BID) | ORAL | Status: DC
  Administered 2024-02-25 – 2024-02-26 (×2): 1 g via ORAL
  Filled 2024-02-25 (×2): qty 1

## 2024-02-25 MED ORDER — ALBUTEROL SULFATE HFA 108 (90 BASE) MCG/ACT IN AERS: 2.0000 | INHALATION_SPRAY | Freq: Four times a day (QID) | RESPIRATORY_TRACT | Status: DC | PRN

## 2024-02-25 MED ORDER — HYDROXYZINE HCL 25 MG PO TABS
25.0000 mg | ORAL_TABLET | Freq: Three times a day (TID) | ORAL | Status: DC | PRN
Start: 2024-02-25 — End: 2024-02-26
  Administered 2024-02-25: 25 mg via ORAL
  Filled 2024-02-25: qty 1

## 2024-02-25 NOTE — Plan of Care (Signed)
  Problem: Education: Goal: Knowledge of General Education information will improve Description: Including pain rating scale, medication(s)/side effects and non-pharmacologic comfort measures Outcome: Progressing   Problem: Clinical Measurements: Goal: Will remain free from infection Outcome: Progressing Goal: Respiratory complications will improve Outcome: Progressing   Problem: Coping: Goal: Level of anxiety will decrease Outcome: Progressing   

## 2024-02-25 NOTE — Progress Notes (Signed)
 Occupational Therapy Treatment Patient Details Name: Gary Mclaughlin MRN: [id number] DOB: 12-23-1970 Today's Date: 02/25/2024   History of present illness Pt is a 53yo male who fell out of a semi truck and suffered R 7-8 rib fx and R pneumothorax requiring chest tube and 6Lo2 supplemental O2. PMH: homeless, ETOH abuse, depression, seizures, SAH, tobacco abuse PSH: L ORIF pelvic fx with percutaneous screws 04/2022   OT comments  Pt greeted seated EOB, highly motivated to participate with OT today. Functionally, he was mod I for all ambulation, transfers, and various UB/LB ADLs. Endorsed 5/10 exertion via RPE scale, indicating reduced activity tolerance. Educated on energy conservation and activity pacing strategies. Able to pull 2500 mL on incentive spirometer x5.   Pt is functioning near his baseline, no further acute OT needs at this time nor post-acute OT needs.      If plan is discharge home, recommend the following:  Assist for transportation;Assistance with cooking/housework   Equipment Recommendations  None recommended by OT    Recommendations for Other Services      Precautions / Restrictions Precautions Precautions: Fall Precaution/Restrictions Comments: R rib fx 7-8 Restrictions Weight Bearing Restrictions Per Provider Order: No       Mobility Bed Mobility               General bed mobility comments: not assessed - pt OOB throughout session    Transfers Overall transfer level: Modified independent Equipment used: None               General transfer comment: mod I ambulating without AD     Balance Overall balance assessment: Mild deficits observed, not formally tested                                         ADL either performed or assessed with clinical judgement   ADL Overall ADL's : Needs assistance/impaired Eating/Feeding: Independent   Grooming: Wash/dry hands;Modified independent;Standing           Upper Body Dressing  : Modified independent;Standing Upper Body Dressing Details (indicate cue type and reason): inc time to reach RUE through arm hole Lower Body Dressing: Modified independent;Sitting/lateral leans Lower Body Dressing Details (indicate cue type and reason): inc effort to reach LLE to thread through underwear Toilet Transfer: Independent   Toileting- Clothing Manipulation and Hygiene: Independent       Functional mobility during ADLs: Supervision/safety      Extremity/Trunk Assessment Upper Extremity Assessment RUE Deficits / Details: painful with end range shoulder movements AROM 2/2 recent removal of chest tube; overall functional with use of RUE            Vision       Perception     Praxis     Communication Communication Communication: No apparent difficulties   Cognition Arousal: Alert Behavior During Therapy: WFL for tasks assessed/performed Cognition: No apparent impairments                               Following commands: Intact        Cueing   Cueing Techniques: Verbal cues  Exercises Other Exercises Other Exercises: incentive spirometer x5 - able to pull 2500 mL each rep Other Exercises: educated on use of RPE scale for monitoring energy and exertion levels during all functional activities upon d/c    Shoulder  Instructions       General Comments SpO2 93-94% on RA, HR low 100s throughout    Pertinent Vitals/ Pain       Pain Assessment Pain Assessment: 0-10 Pain Score: 7  Pain Location: R ribs/flank Pain Intervention(s): Limited activity within patient's tolerance, Monitored during session, Repositioned  Home Living                                          Prior Functioning/Environment              Frequency           Progress Toward Goals  OT Goals(current goals can now be found in the care plan section)  Progress towards OT goals: Progressing toward goals     Plan      Co-evaluation                  AM-PAC OT 6 Clicks Daily Activity     Outcome Measure   Help from another person eating meals?: None Help from another person taking care of personal grooming?: None Help from another person toileting, which includes using toliet, bedpan, or urinal?: None Help from another person bathing (including washing, rinsing, drying)?: None Help from another person to put on and taking off regular upper body clothing?: None Help from another person to put on and taking off regular lower body clothing?: None 6 Click Score: 24    End of Session    OT Visit Diagnosis: Unsteadiness on feet (R26.81);Pain   Activity Tolerance Patient tolerated treatment well   Patient Left with bed alarm set (seated EOB)   Nurse Communication Mobility status        Time: 8892-8868 OT Time Calculation (min): 24 min  Charges: OT General Charges $OT Visit: 1 Visit OT Treatments $Self Care/Home Management : 23-37 mins  Breawna Montenegro D., MSOT, OTR/L Acute Rehabilitation Services [phone] Secure Chat Preferred  Rikki Milch 02/25/2024, 11:47 AM

## 2024-02-25 NOTE — Progress Notes (Signed)
 Progress Note     Subjective: Pt comfortable this AM, having some breakfast. Chest tube removed at bedside, some pain after but looks well. Pt highly motivated to mobilize more today. He reports the person he stays with sometimes is currently admitted somewhere on the 5th floor here.   Objective: Vital signs in last 24 hours: Temp:  [98.1 F (36.7 C)-99 F (37.2 C)] 98.2 F (36.8 C) (10/23 0725) Pulse Rate:  [68-85] 68 (10/23 0725) Resp:  [15-18] 15 (10/23 0725) BP: (133-156)/(88-98) 150/98 (10/23 0725) SpO2:  [92 %-100 %] 100 % (10/23 0842) Last BM Date : 02/22/24  Intake/Output from previous day: 10/22 0701 - 10/23 0700 In: 0  Out: 2745 [Urine:2675; Chest Tube:70] Intake/Output this shift: No intake/output data recorded.  PE: General: pleasant, WD, WN male who is laying in bed in NAD Heart: regular, rate, and rhythm.   Lungs: CTAB, no wheezes, rhonchi, or rales noted.  Respiratory effort nonlabored, R CT in place w/o air leak, SS fluid in sahara; CTAB s/p right CT removal as well  Abd: soft, NT, ND MS: all 4 extremities are symmetrical with no cyanosis, clubbing, or edema. Psych: Alert, a little sedated but able to answer questions appropriately   Lab Results:  Recent Labs    02/23/24 0230 02/25/24 0115  WBC 7.4 7.8  HGB 11.3* 10.7*  HCT 33.5* 32.2*  PLT 385 396   BMET Recent Labs    02/23/24 0230 02/25/24 0115  NA 128* 131*  K 4.7 4.5  CL 93* 95*  CO2 27 28  GLUCOSE 95 95  BUN 6 12  CREATININE 0.64 0.71  CALCIUM 9.5 9.2   PT/INR No results for input(s): LABPROT, INR in the last 72 hours. CMP     Component Value Date/Time   NA 131 (L) 02/25/2024 0115   K 4.5 02/25/2024 0115   CL 95 (L) 02/25/2024 0115   CO2 28 02/25/2024 0115   GLUCOSE 95 02/25/2024 0115   BUN 12 02/25/2024 0115   CREATININE 0.71 02/25/2024 0115   CALCIUM 9.2 02/25/2024 0115   PROT 8.0 12/20/2023 0758   ALBUMIN 4.2 12/20/2023 0758   AST 77 (H) 12/20/2023 0758   ALT  52 (H) 12/20/2023 0758   ALKPHOS 88 12/20/2023 0758   BILITOT 0.4 12/20/2023 0758   GFRNONAA >60 02/25/2024 0115   GFRAA >60 07/28/2017 0326   Lipase     Component Value Date/Time   LIPASE 62 (H) 08/06/2023 0240       Studies/Results: DG CHEST PORT 1 VIEW Result Date: 02/24/2024 CLINICAL DATA:  Right-sided chest tube.  Evaluate pneumothorax. EXAM: PORTABLE CHEST 1 VIEW COMPARISON:  02/23/2024. FINDINGS: Right lateral basilar chest tube unchanged. No right-sided pneumothorax visualized. Lungs are otherwise clear. Cardiomediastinal silhouette is normal. Evidence of patient's right posterior rib fractures. IMPRESSION: 1. No acute cardiopulmonary disease. 2. Right lateral basilar chest tube unchanged. No pneumothorax visualized. Electronically Signed   By: Toribio Agreste M.D.   On: 02/24/2024 07:54    Anti-infectives: Anti-infectives (From admission, onward)    None        Assessment/Plan Fall out of semi R 7-8 rib fxs with R PTX - R CT removed, repeat CXR in 4 hrs, IS, multimodal pain control Alcohol  abuse with hx of alcohol  withdrawal - CIWA, beer TID with meals and phenobarb taper Hyponatremia - Na 131, improving, suspect in setting of above Tobacco abuse - add nicotine  patch  FEN: reg diet, SLIV VTE: LMWH ID: no current abx  Dispo: 3W, Repeat CXR 1230. Continue therapies    LOS: 3 days   I reviewed ED provider notes, last 24 h vitals and pain scores, last 48 h intake and output, last 24 h labs and trends, and last 24 h imaging results.  This care required moderate level of medical decision making.    Burnard JONELLE Louder, [facility] Surgery 02/25/2024, 10:09 AM Please see Amion for pager number during day hours 7:00am-4:30pm

## 2024-02-25 NOTE — TOC Progression Note (Addendum)
 Transition of Care [facility]) - Progression Note    Patient Details  Name: Gary Mclaughlin MRN: [id number] Date of Birth: 12-13-1970  Transition of [facility]) CM/SW Contact  Alcus Bradly E Acxel Dingee, LCSW Phone Number: 02/25/2024, 12:46 PM  Clinical Narrative:    Awaiting SNF bed offers. Per rounds, patient may progress and be able to go home.   Expected Discharge Plan: Skilled Nursing Facility Barriers to Discharge: Continued Medical Work up               Expected Discharge Plan and Services       Living arrangements for the past 2 months: Homeless                                       Social Drivers of Health (SDOH) Interventions SDOH Screenings   Food Insecurity: Low Risk  (10/19/2023)   Received from [facility]  Housing: Low Risk  (10/19/2023)   Received from [facility]  Recent Concern: Housing - Medium Risk (10/05/2023)   Received from [facility]  Transportation Needs: No Transportation Needs (10/19/2023)   Received from [facility]  Utilities: Low Risk  (10/19/2023)   Received from [facility]  Alcohol  Screen: High Risk (04/10/2023)  Depression (PHQ2-9): Medium Risk (04/14/2023)  Tobacco Use: Medium Risk (01/19/2024)    Readmission Risk Interventions     No data to display

## 2024-02-25 NOTE — Plan of Care (Signed)
 PRN pain med in use for flank/chest pain. Foods and fluids well tolerated. Beer with meals as ordered. No s/s of withdrawls during the shift.   Problem: Education: Goal: Knowledge of General Education information will improve Description: Including pain rating scale, medication(s)/side effects and non-pharmacologic comfort measures Outcome: Progressing   Problem: Activity: Goal: Risk for activity intolerance will decrease Outcome: Progressing   Problem: Nutrition: Goal: Adequate nutrition will be maintained Outcome: Progressing   Problem: Pain Managment: Goal: General experience of comfort will improve and/or be controlled Outcome: Progressing   Problem: Safety: Goal: Ability to remain free from injury will improve Outcome: Progressing   Problem: Skin Integrity: Goal: Risk for impaired skin integrity will decrease Outcome: Progressing

## 2024-02-25 NOTE — Progress Notes (Signed)
 Burnard Louder asked to change nebs albuterol  to MDI due to pt preference.    Sergio Batch, PharmD, BCIDP, AAHIVP, CPP Infectious Disease Pharmacist 02/25/2024 9:49 AM

## 2024-02-26 ENCOUNTER — Other Ambulatory Visit (HOSPITAL_COMMUNITY): Payer: Self-pay

## 2024-02-26 LAB — BASIC METABOLIC PANEL WITH GFR
Anion gap: 11 (ref 5–15)
BUN: 15 mg/dL (ref 6–20)
CO2: 25 mmol/L (ref 22–32)
Calcium: 9.2 mg/dL (ref 8.9–10.3)
Chloride: 94 mmol/L — ABNORMAL LOW (ref 98–111)
Creatinine, Ser: 0.66 mg/dL (ref 0.61–1.24)
GFR, Estimated: >60 mL/min (ref >60)
Glucose, Bld: 85 mg/dL (ref 70–99)
Potassium: 4.4 mmol/L (ref 3.5–5.1)
Sodium: 130 mmol/L — ABNORMAL LOW (ref 135–145)

## 2024-02-26 MED ORDER — ESCITALOPRAM OXALATE 10 MG PO TABS
10.0000 mg | ORAL_TABLET | Freq: Every day | ORAL | 0 refills | Status: DC
  Filled 2024-02-26: qty 30, 30d supply, fill #0

## 2024-02-26 MED ORDER — GABAPENTIN 300 MG PO CAPS
300.0000 mg | ORAL_CAPSULE | Freq: Three times a day (TID) | ORAL | 0 refills | Status: DC
  Filled 2024-02-26: qty 90, 30d supply, fill #0

## 2024-02-26 MED ORDER — GUAIFENESIN ER 600 MG PO TB12: 600.0000 mg | ORAL_TABLET | Freq: Two times a day (BID) | ORAL | Status: DC | PRN

## 2024-02-26 MED ORDER — HYDROXYZINE HCL 25 MG PO TABS
25.0000 mg | ORAL_TABLET | Freq: Three times a day (TID) | ORAL | 0 refills | Status: DC | PRN
  Filled 2024-02-26: qty 30, 10d supply, fill #0

## 2024-02-26 MED ORDER — NICOTINE 14 MG/24HR TD PT24
14.0000 mg | MEDICATED_PATCH | Freq: Every day | TRANSDERMAL | 0 refills | Status: DC
  Filled 2024-02-26: qty 28, 28d supply, fill #0

## 2024-02-26 MED ORDER — ALBUTEROL SULFATE HFA 108 (90 BASE) MCG/ACT IN AERS
2.0000 | INHALATION_SPRAY | Freq: Four times a day (QID) | RESPIRATORY_TRACT | 0 refills | Status: DC | PRN
  Filled 2024-02-26: qty 6.7, 25d supply, fill #0

## 2024-02-26 MED ORDER — IBUPROFEN 600 MG PO TABS
600.0000 mg | ORAL_TABLET | Freq: Four times a day (QID) | ORAL | 0 refills | Status: DC
  Filled 2024-02-26: qty 30, 8d supply, fill #0

## 2024-02-26 MED ORDER — PHENOBARBITAL 32.4 MG PO TABS
ORAL_TABLET | ORAL | 0 refills | Status: DC
  Filled 2024-02-26: qty 15, 4d supply, fill #0

## 2024-02-26 MED ORDER — BUDESONIDE-FORMOTEROL FUMARATE 160-4.5 MCG/ACT IN AERO
2.0000 | INHALATION_SPRAY | Freq: Two times a day (BID) | RESPIRATORY_TRACT | 0 refills | Status: DC
  Filled 2024-02-26: qty 10.2, 30d supply, fill #0

## 2024-02-26 MED ORDER — CETIRIZINE HCL 10 MG PO TABS
10.0000 mg | ORAL_TABLET | Freq: Every day | ORAL | 1 refills | Status: AC
Start: 2024-02-26 — End: ?
  Filled 2024-02-26: qty 30, 30d supply, fill #0

## 2024-02-26 MED ORDER — LIDOCAINE 5 % EX PTCH
1.0000 | MEDICATED_PATCH | CUTANEOUS | 0 refills | Status: DC
  Filled 2024-02-26: qty 30, 30d supply, fill #0

## 2024-02-26 MED ORDER — ACETAMINOPHEN 500 MG PO TABS: 1000.0000 mg | ORAL_TABLET | Freq: Four times a day (QID) | ORAL | Status: DC | PRN

## 2024-02-26 MED ORDER — OXYCODONE HCL 5 MG PO TABS
5.0000 mg | ORAL_TABLET | ORAL | 0 refills | Status: DC | PRN
  Filled 2024-02-26: qty 30, 5d supply, fill #0

## 2024-02-26 MED ORDER — METHOCARBAMOL 500 MG PO TABS
1000.0000 mg | ORAL_TABLET | Freq: Three times a day (TID) | ORAL | 0 refills | Status: DC | PRN
  Filled 2024-02-26: qty 60, 10d supply, fill #0

## 2024-02-26 NOTE — Progress Notes (Signed)
 Gary Mclaughlin to be discharged home per MD order. Discussed with the patient and all questions fully answered. TOC confirmed medications were picked up. Skin clean, dry, and intact without evidence of skin break down. IV catheter discontinued intact. Site without signs and symptoms of complications. Dressing and pressure applied.  An After Visit Summary was printed and given to the patient.   Gary Mclaughlin  02/26/2024

## 2024-02-26 NOTE — Discharge Summary (Signed)
 Physician Discharge Summary  Patient ID: Gary Mclaughlin MRN: [id number] DOB/AGE: [birth year] 53 y.o.  Admit date: 02/22/2024 Discharge date: 02/26/2024  Discharge Diagnoses Fall out of semi Right 7-8 rib fractures with right pneumothorax Alcohol  abuse with history of withdrawal Tobacco abuse  Consultants None  Procedures Chest tube insertion - Lavonia Pat, MD (02/22/24)  HPI: Patient is a 53 year old male who presented to the ED s/p fall out of semi truck around 5 AM, he had been sleeping in there the night prior. Initially thought he could walk it off and pain would improve. Presented for worsening pain over right chest. Pain is worse with trying to breath. Patient reports history of significant alcohol  abuse with prior withdrawal seizures. Found to have right pneumothorax and chest tube placed by ED provider. Admitted to trauma service.   Hospital Course: Patient started on phenobarbital  taper for prevention of withdrawal seizures in setting of severe alcohol  abuse as well as TID beer. Right pneumothorax resolved and chest tube was able to be removed 10/23, follow up CXR was stable without recurrent pneumothorax. Patient was evaluated by therapies throughout admission and was initially recommended for SNF but improved to no PT/OT follow up once chest tube was out. On 02/26/24 patient was stable for discharge home with follow up as outlined below.   PE: General: pleasant, WD, WN male who is laying in bed in NAD Heart: regular, rate, and rhythm.   Lungs: CTAB, no wheezes, rhonchi, or rales noted.  Respiratory effort nonlabored on room air Abd: soft, NT, ND MS: all 4 extremities are symmetrical with no cyanosis, clubbing, or edema. Psych: Alert, a little sedated but able to answer questions appropriately  I or a member of my team have reviewed this patient in the Controlled Substance Database   Allergies as of 02/26/2024   No Known Allergies      Medication List     STOP taking  these medications    chlordiazePOXIDE  25 MG capsule Commonly known as: LIBRIUM        TAKE these medications    acetaminophen  500 MG tablet Commonly known as: TYLENOL  Take 2 tablets (1,000 mg total) by mouth every 6 (six) hours as needed for mild pain (pain score 1-3), fever or headache.   albuterol  108 (90 Base) MCG/ACT inhaler Commonly known as: VENTOLIN  HFA Inhale 2 puffs into the lungs every 6 (six) hours as needed for wheezing or shortness of breath.   budesonide-formoterol  160-4.5 MCG/ACT inhaler Commonly known as: SYMBICORT Inhale 2 puffs into the lungs 2 (two) times daily.   cetirizine  10 MG tablet Commonly known as: ZyrTEC  Allergy Take 1 tablet (10 mg total) by mouth daily.   escitalopram 10 MG tablet Commonly known as: LEXAPRO Take 1 tablet (10 mg total) by mouth daily.   gabapentin  300 MG capsule Commonly known as: NEURONTIN  Take 1 capsule (300 mg total) by mouth 3 (three) times daily.   guaiFENesin  600 MG 12 hr tablet Commonly known as: MUCINEX  Take 1 tablet (600 mg total) by mouth 2 (two) times daily as needed for to loosen phlegm.   hydrOXYzine  25 MG tablet Commonly known as: ATARAX  Take 1 tablet (25 mg total) by mouth 3 (three) times daily as needed for anxiety.   ibuprofen  600 MG tablet Commonly known as: ADVIL  Take 1 tablet (600 mg total) by mouth every 6 (six) hours. TAKE WITH FOOD   lidocaine  5 % Commonly known as: LIDODERM  Place 1 patch onto the skin daily. Remove & Discard patch within  12 hours or as directed by MD Start taking on: February 27, 2024   methocarbamol  500 MG tablet Commonly known as: ROBAXIN  Take 2 tablets (1,000 mg total) by mouth every 8 (eight) hours as needed for muscle spasms.   nicotine  14 mg/24hr patch Commonly known as: NICODERM CQ  - dosed in mg/24 hours Place 1 patch (14 mg total) onto the skin daily. Start taking on: February 27, 2024   oxyCODONE  5 MG immediate release tablet Commonly known as: Oxy  IR/ROXICODONE  Take 1 tablet (5 mg total) by mouth every 4 (four) hours as needed for moderate pain (pain score 4-6).   PHENobarbital  32.4 MG tablet Commonly known as: LUMINAL Take 2 tablets (64.8 mg total) by mouth every 8 (eight) hours for 1 day, THEN 1 tablet (32.4 mg total) every 8 (eight) hours for 3 days. Start taking on: February 26, 2024          Follow-up Information     [facility]. Go to.   Why: Come to the radiology department at [facility] for follow up chest x-ray in about 2 weeks. Contact information: [address] [place]  [phone]        [facility]. Call.   Why: As needed. Provider will call you with results of outpatient chest x-ray. Contact information: [address] [place]  [address] [phone]        Eveline Donnice Pac, PA-C. Schedule an appointment as soon as possible for a visit in 1 week(s).   Specialties: General Surgery, General Practice Why: To follow up post- hospitalization and for refills on inhalers and pain medications Contact information: [facility] [place] [address] [phone]                 Signed: Burnard JONELLE Louder , [facility] Surgery 02/26/2024, 10:35 AM Please see Amion for pager number during day hours 7:00am-4:30pm

## 2024-02-26 NOTE — TOC Progression Note (Addendum)
 Transition of Care [facility]) - Progression Note    Patient Details  Name: Rasmus Preusser MRN: [id number] Date of Birth: 08-Apr-1971  [facility] CM/SW Contact  Alin Hutchins E Annibelle Brazie, LCSW Phone Number: 02/26/2024, 8:41 AM  Clinical Narrative:    Notified by PT that patient is doing well and plans to DC to a friend's home. No post acute PT/OT needs.   Expected Discharge Plan: Skilled Nursing Facility Barriers to Discharge: Continued Medical Work up               Expected Discharge Plan and Services       Living arrangements for the past 2 months: Homeless                                       Social Drivers of Health (SDOH) Interventions SDOH Screenings   Food Insecurity: Low Risk  (10/19/2023)   Received from [facility]  Housing: Low Risk  (10/19/2023)   Received from [facility]  Recent Concern: Housing - Medium Risk (10/05/2023)   Received from [facility]  Transportation Needs: No Transportation Needs (10/19/2023)   Received from [facility]  Utilities: Low Risk  (10/19/2023)   Received from [facility]  Alcohol  Screen: High Risk (04/10/2023)  Depression (PHQ2-9): Medium Risk (04/14/2023)  Tobacco Use: Medium Risk (01/19/2024)    Readmission Risk Interventions     No data to display

## 2024-02-26 NOTE — Progress Notes (Signed)
 Physical Therapy Treatment Patient Details Name: Gary Mclaughlin MRN: [id number] DOB: 1970-12-08 Today's Date: 02/26/2024   History of Present Illness Pt is a 53yo male who fell out of a semi truck and suffered R 7-8 rib fx and R pneumothorax requiring chest tube and 6Lo2 supplemental O2. PMH: homeless, ETOH abuse, depression, seizures, SAH, tobacco abuse PSH: L ORIF pelvic fx with percutaneous screws 04/2022   PT Comments  Pt received on EOB and agreeable to PT session. Pt was able to meet all acute PT goals in today's session being ModI for all mobility. Pt reported being able to stay with a friend upon d/c with a large step to enter. Pt was able to ascend/descend 5 steps with no handrail at ModI level. No need to practice floor transfer as pt will not be returning to the tent. Continued discussion on energy conservation techniques and progressive mobility with pt verbalizing understanding. Pt feels comfortable discharging home with no further acute or post-acute PT needs. Acute PT signing off. Please re-consult if there are any changes in status.     If plan is discharge home, recommend the following: A little help with walking and/or transfers;A lot of help with bathing/dressing/bathroom;Assist for transportation   Can travel by private vehicle     Yes  Equipment Recommendations  None recommended by PT       Precautions / Restrictions Precautions Precautions: Fall Precaution/Restrictions Comments: R rib fx 7-8 Restrictions Weight Bearing Restrictions Per Provider Order: No     Mobility  Bed Mobility    General bed mobility comments: seated on EOB upon arrival    Transfers Overall transfer level: Modified independent Equipment used: None Transfers: Sit to/from Stand   Ambulation/Gait Ambulation/Gait assistance: Modified independent (Device/Increase time) Gait Distance (Feet): 800 Feet Assistive device: None Gait Pattern/deviations: Step-through pattern, Decreased stride  length, Trunk flexed    General Gait Details: steady gait with no UE support, slightly decreased gait speed   Stairs Stairs: Yes Stairs assistance: Modified independent (Device/Increase time) Stair Management: No rails, Forwards, Alternating pattern Number of Stairs: 5 General stair comments: steady with no UE support     Balance Overall balance assessment: Needs assistance Sitting-balance support: Feet supported, No upper extremity supported Sitting balance-Leahy Scale: Normal     Standing balance support: No upper extremity supported Standing balance-Leahy Scale: Good     Communication Communication Communication: No apparent difficulties  Cognition Arousal: Alert Behavior During Therapy: WFL for tasks assessed/performed   PT - Cognitive impairments: No apparent impairments      Following commands: Intact      Cueing Cueing Techniques: Verbal cues         Pertinent Vitals/Pain Pain Assessment Pain Assessment: Faces Faces Pain Scale: Hurts even more Pain Location: R ribs when coughing Pain Descriptors / Indicators: Sharp, Discomfort, Grimacing Pain Intervention(s): Limited activity within patient's tolerance, Monitored during session, Repositioned     PT Goals (current goals can now be found in the care plan section) Acute Rehab PT Goals PT Goal Formulation: With patient Time For Goal Achievement: 03/08/24 Potential to Achieve Goals: Good Progress towards PT goals: Goals met/education completed, patient discharged from PT    Frequency    Min 3X/week       AM-PAC PT 6 Clicks Mobility   Outcome Measure  Help needed turning from your back to your side while in a flat bed without using bedrails?: None Help needed moving from lying on your back to sitting on the side of a flat bed  without using bedrails?: None Help needed moving to and from a bed to a chair (including a wheelchair)?: None Help needed standing up from a chair using your arms (e.g.,  wheelchair or bedside chair)?: None Help needed to walk in hospital room?: None Help needed climbing 3-5 steps with a railing? : None 6 Click Score: 24    End of Session   Activity Tolerance: Patient tolerated treatment well Patient left: in bed;with call bell/phone within reach Nurse Communication: Mobility status PT Visit Diagnosis: Unsteadiness on feet (R26.81)     Time: 9177-9159 PT Time Calculation (min) (ACUTE ONLY): 18 min  Charges:    $Gait Training: 8-22 mins PT General Charges $$ ACUTE PT VISIT: 1 Visit                    Kate ORN, PT, DPT Secure Chat Preferred  Rehab Office [phone]   Kate BRAVO Wendolyn 02/26/2024, 8:43 AM

## 2024-03-04 ENCOUNTER — Encounter (HOSPITAL_COMMUNITY): Payer: Self-pay

## 2024-03-04 ENCOUNTER — Emergency Department (HOSPITAL_COMMUNITY): Payer: MEDICAID

## 2024-03-04 ENCOUNTER — Other Ambulatory Visit: Payer: Self-pay

## 2024-03-04 ENCOUNTER — Inpatient Hospital Stay (HOSPITAL_COMMUNITY)
Admission: EM | Admit: 2024-03-04 | Discharge: 2024-03-08 | DRG: 189 | Disposition: A | Discharge status: Home / Self Care | Payer: MEDICAID | Attending: Internal Medicine | Admitting: Internal Medicine

## 2024-03-04 DIAGNOSIS — K760 Fatty (change of) liver, not elsewhere classified: Secondary | ICD-10-CM | POA: Diagnosis present

## 2024-03-04 DIAGNOSIS — F10139 Alcohol abuse with withdrawal, unspecified: Secondary | ICD-10-CM | POA: Diagnosis present

## 2024-03-04 DIAGNOSIS — Z79899 Other long term (current) drug therapy: Secondary | ICD-10-CM

## 2024-03-04 DIAGNOSIS — F10120 Alcohol abuse with intoxication, uncomplicated: Secondary | ICD-10-CM | POA: Diagnosis present

## 2024-03-04 DIAGNOSIS — D75839 Thrombocytosis, unspecified: Secondary | ICD-10-CM | POA: Diagnosis present

## 2024-03-04 DIAGNOSIS — S2241XA Multiple fractures of ribs, right side, initial encounter for closed fracture: Secondary | ICD-10-CM | POA: Diagnosis present

## 2024-03-04 DIAGNOSIS — Z7951 Long term (current) use of inhaled steroids: Secondary | ICD-10-CM | POA: Diagnosis not present

## 2024-03-04 DIAGNOSIS — Z8249 Family history of ischemic heart disease and other diseases of the circulatory system: Secondary | ICD-10-CM

## 2024-03-04 DIAGNOSIS — Z595 Extreme poverty: Secondary | ICD-10-CM

## 2024-03-04 DIAGNOSIS — E871 Hypo-osmolality and hyponatremia: Secondary | ICD-10-CM | POA: Diagnosis present

## 2024-03-04 DIAGNOSIS — T50916A Underdosing of multiple unspecified drugs, medicaments and biological substances, initial encounter: Secondary | ICD-10-CM | POA: Diagnosis present

## 2024-03-04 DIAGNOSIS — I959 Hypotension, unspecified: Secondary | ICD-10-CM | POA: Diagnosis present

## 2024-03-04 DIAGNOSIS — E860 Dehydration: Secondary | ICD-10-CM | POA: Diagnosis present

## 2024-03-04 DIAGNOSIS — Z87891 Personal history of nicotine dependence: Secondary | ICD-10-CM

## 2024-03-04 DIAGNOSIS — Z5900 Homelessness unspecified: Secondary | ICD-10-CM

## 2024-03-04 DIAGNOSIS — R0789 Other chest pain: Secondary | ICD-10-CM

## 2024-03-04 DIAGNOSIS — J9601 Acute respiratory failure with hypoxia: Secondary | ICD-10-CM | POA: Diagnosis present

## 2024-03-04 DIAGNOSIS — Z91120 Patient's intentional underdosing of medication regimen due to financial hardship: Secondary | ICD-10-CM | POA: Diagnosis not present

## 2024-03-04 DIAGNOSIS — S2249XA Multiple fractures of ribs, unspecified side, initial encounter for closed fracture: Secondary | ICD-10-CM | POA: Diagnosis present

## 2024-03-04 DIAGNOSIS — J9811 Atelectasis: Principal | ICD-10-CM | POA: Diagnosis present

## 2024-03-04 DIAGNOSIS — J9691 Respiratory failure, unspecified with hypoxia: Secondary | ICD-10-CM | POA: Diagnosis present

## 2024-03-04 DIAGNOSIS — F10920 Alcohol use, unspecified with intoxication, uncomplicated: Secondary | ICD-10-CM

## 2024-03-04 LAB — CBC WITH DIFFERENTIAL/PLATELET
Abs Immature Granulocytes: 0.06 K/uL (ref 0.00–0.07)
Basophils Absolute: 0.2 K/uL — ABNORMAL HIGH (ref 0.0–0.1)
Basophils Relative: 2 %
Eosinophils Absolute: 0.3 K/uL (ref 0.0–0.5)
Eosinophils Relative: 3 %
HCT: 33.2 % — ABNORMAL LOW (ref 39.0–52.0)
Hemoglobin: 11 g/dL — ABNORMAL LOW (ref 13.0–17.0)
Immature Granulocytes: 1 %
Lymphocytes Relative: 26 %
Lymphs Abs: 2.1 K/uL (ref 0.7–4.0)
MCH: 29.9 pg (ref 26.0–34.0)
MCHC: 33.1 g/dL (ref 30.0–36.0)
MCV: 90.2 fL (ref 80.0–100.0)
Monocytes Absolute: 0.5 K/uL (ref 0.1–1.0)
Monocytes Relative: 6 %
Neutro Abs: 5.1 K/uL (ref 1.7–7.7)
Neutrophils Relative %: 62 %
Platelets: 854 K/uL — ABNORMAL HIGH (ref 150–400)
RBC: 3.68 MIL/uL — ABNORMAL LOW (ref 4.22–5.81)
RDW: 17.7 % — ABNORMAL HIGH (ref 11.5–15.5)
WBC: 8.1 K/uL (ref 4.0–10.5)
nRBC: 0 % (ref 0.0–0.2)

## 2024-03-04 LAB — COMPREHENSIVE METABOLIC PANEL WITH GFR
ALT: 27 U/L (ref 0–44)
AST: 40 U/L (ref 15–41)
Albumin: 3.2 g/dL — ABNORMAL LOW (ref 3.5–5.0)
Alkaline Phosphatase: 101 U/L (ref 38–126)
Anion gap: 14 (ref 5–15)
BUN: 10 mg/dL (ref 6–20)
CO2: 27 mmol/L (ref 22–32)
Calcium: 9.4 mg/dL (ref 8.9–10.3)
Chloride: 93 mmol/L — ABNORMAL LOW (ref 98–111)
Creatinine, Ser: 0.71 mg/dL (ref 0.61–1.24)
GFR, Estimated: >60 mL/min (ref >60)
Glucose, Bld: 91 mg/dL (ref 70–99)
Potassium: 4.2 mmol/L (ref 3.5–5.1)
Sodium: 134 mmol/L — ABNORMAL LOW (ref 135–145)
Total Bilirubin: <0.2 mg/dL (ref 0.0–1.2)
Total Protein: 6.9 g/dL (ref 6.5–8.1)

## 2024-03-04 MED ORDER — LIDOCAINE 5 % EX PTCH
1.0000 | MEDICATED_PATCH | Freq: Every day | CUTANEOUS | Status: DC
  Administered 2024-03-05 – 2024-03-07 (×3): 1 via TRANSDERMAL
  Filled 2024-03-04 (×4): qty 1

## 2024-03-04 MED ORDER — LIDOCAINE 5 % EX PTCH
1.0000 | MEDICATED_PATCH | CUTANEOUS | Status: DC
  Administered 2024-03-04: 1 via TRANSDERMAL
  Filled 2024-03-04: qty 1

## 2024-03-04 MED ORDER — OXYCODONE HCL 5 MG PO TABS: 5.0000 mg | ORAL_TABLET | Freq: Four times a day (QID) | ORAL | Status: DC | PRN

## 2024-03-04 MED ORDER — KETOROLAC TROMETHAMINE 15 MG/ML IJ SOLN
15.0000 mg | Freq: Once | INTRAMUSCULAR | Status: AC
  Administered 2024-03-04: 15 mg via INTRAVENOUS
  Filled 2024-03-04: qty 1

## 2024-03-04 MED ORDER — SODIUM CHLORIDE 0.9 % IV BOLUS
1000.0000 mL | Freq: Once | INTRAVENOUS | Status: AC
  Administered 2024-03-04: 1000 mL via INTRAVENOUS

## 2024-03-04 NOTE — ED Notes (Addendum)
 MD Dorrell notified of pts BP. Pt alert and A&Ox4. Pt pulled out IV and is refusing another one. Pt stated I don't need that shit

## 2024-03-04 NOTE — ED Notes (Signed)
 Found pt with IV out, eating marshmallows from personal bag with oxygen and all monitoring off. Pt educated on need for monitoring, reports IV was doing nothing

## 2024-03-04 NOTE — ED Notes (Signed)
 Talked with phlebotomy about lab work, phlebotomist reports she will attempt the collection when she is able.

## 2024-03-04 NOTE — ED Triage Notes (Addendum)
 Pt bib ems from outside; ems called by gpd; pt threatening to fight people; etoh on board; 112/78, HR 78, 99% RA, cbg 100; c/o R rib pain, broken ribs from recent fall; endorses recent productive cough; denies fevers; states he drank 1/5 liquor today; denies si/hi; pt alert and oriented to self and time, disoriented to situation and place

## 2024-03-04 NOTE — ED Notes (Signed)
 Per previous RN Wellington, multiple unsuccessful attempts at obtaining lab draws for bloodwork. Phlebotomy to attempt to collect labs.

## 2024-03-04 NOTE — ED Provider Notes (Signed)
 [facility] EMERGENC[facility]PARTMENT AT [facility] Provider Note   CSN: [id number] Arrival date & time: 03/04/24  1801     Patient presents with: No chief complaint on file.   Gary Mclaughlin is a 53 y.o. male.   HPI    Presents because of intoxication.  Brought in by EMS.  EMS was called by please department.  Patient threatening to fight people.  Patient found to be intoxicated was brought to the ED for further evaluation and  Denies any recent falls.  Denies any headaches.  Patient does endorse drinking about 1/5 of liquor today.  Endorsing right-sided chest pain from his previous rib fractures and chest tube.  No fever no chills.  No shortness of breath.  Patient states that he was given small dose of oxycodone  but subsequently ran out of this medication for pain so has been drinking.  No midsternal chest pain.  No pleuritic chest pain or hemoptysis.  No history of DVT or PE.  Denies all other complaints.  Adamant that he had no recent falls.   Previous medical history reviewed : Patient discharged on February 26, 2024.  Was discharged after rib fractures and right pneumothorax status post pigtail.   Prior to Admission medications   Medication Sig Start Date End Date Taking? Authorizing Provider  acetaminophen  (TYLENOL ) 500 MG tablet Take 2 tablets (1,000 mg total) by mouth every 6 (six) hours as needed for mild pain (pain score 1-3), fever or headache. 02/26/24   Vicci Burnard SAUNDERS, PA-C  albuterol  (VENTOLIN  HFA) 108 (90 Base) MCG/ACT inhaler Inhale 2 puffs into the lungs every 6 (six) hours as needed for wheezing or shortness of breath. 02/26/24   Vicci Burnard SAUNDERS, PA-C  budesonide-formoterol  (SYMBICORT) 160-4.5 MCG/ACT inhaler Inhale 2 puffs into the lungs 2 (two) times daily. 02/26/24   Vicci Burnard SAUNDERS, PA-C  cetirizine  (ZYRTEC  ALLERGY) 10 MG tablet Take 1 tablet (10 mg total) by mouth daily. 02/26/24   Vicci Burnard SAUNDERS, PA-C  escitalopram (LEXAPRO) 10 MG tablet Take 1  tablet (10 mg total) by mouth daily. 02/26/24   Vicci Burnard SAUNDERS, PA-C  gabapentin  (NEURONTIN ) 300 MG capsule Take 1 capsule (300 mg total) by mouth 3 (three) times daily. 02/26/24   Vicci Burnard SAUNDERS, PA-C  guaiFENesin  (MUCINEX ) 600 MG 12 hr tablet Take 1 tablet (600 mg total) by mouth 2 (two) times daily as needed for to loosen phlegm. 02/26/24   Vicci Burnard SAUNDERS, PA-C  hydrOXYzine  (ATARAX ) 25 MG tablet Take 1 tablet (25 mg total) by mouth 3 (three) times daily as needed for anxiety. 02/26/24   Vicci Burnard SAUNDERS, PA-C  ibuprofen  (ADVIL ) 600 MG tablet Take 1 tablet (600 mg total) by mouth every 6 (six) hours. TAKE WITH FOOD 02/26/24   Vicci Burnard SAUNDERS, PA-C  lidocaine  (LIDODERM ) 5 % Place 1 patch onto the skin daily. Remove & Discard patch within 12 hours or as directed by MD 02/27/24   Vicci Burnard SAUNDERS, PA-C  methocarbamol  (ROBAXIN ) 500 MG tablet Take 2 tablets (1,000 mg total) by mouth every 8 (eight) hours as needed for muscle spasms. 02/26/24   Vicci Burnard SAUNDERS, PA-C  nicotine  (NICODERM CQ  - DOSED IN MG/24 HOURS) 14 mg/24hr patch Place 1 patch (14 mg total) onto the skin daily. 02/27/24   Vicci Burnard SAUNDERS, PA-C  oxyCODONE  (OXY IR/ROXICODONE ) 5 MG immediate release tablet Take 1 tablet (5 mg total) by mouth every 4 (four) hours as needed for moderate pain (pain score 4-6). 02/26/24  Vicci Sor R, PA-C  PHENobarbital  (LUMINAL) 32.4 MG tablet Take 2 tablets (64.8 mg total) by mouth every 8 (eight) hours for 1 day, THEN 1 tablet (32.4 mg total) every 8 (eight) hours for 3 days. 02/26/24 03/01/24  Vicci Sor SAUNDERS, PA-C    Allergies: Patient has no known allergies.    Review of Systems  Constitutional:  Negative for chills and fever.  HENT:  Negative for ear pain and sore throat.   Eyes:  Negative for pain and visual disturbance.  Respiratory:  Negative for cough and shortness of breath.   Cardiovascular:  Negative for chest pain and palpitations.  Gastrointestinal:  Negative for abdominal  pain and vomiting.  Genitourinary:  Negative for dysuria and hematuria.  Musculoskeletal:  Negative for arthralgias and back pain.  Skin:  Negative for color change and rash.  Neurological:  Negative for seizures and syncope.  All other systems reviewed and are negative.   Updated Vital Signs BP 91/61   Pulse 74   Temp 97.8 F (36.6 C)   Resp 14   SpO2 96%   Physical Exam Vitals and nursing note reviewed.  Constitutional:      General: He is not in acute distress.    Appearance: He is well-developed.  HENT:     Head: Normocephalic and atraumatic.  Eyes:     Conjunctiva/sclera: Conjunctivae normal.  Cardiovascular:     Rate and Rhythm: Normal rate and regular rhythm.     Heart sounds: No murmur heard. Pulmonary:     Effort: Pulmonary effort is normal. No respiratory distress.     Breath sounds: Normal breath sounds.  Abdominal:     Palpations: Abdomen is soft.     Tenderness: There is no abdominal tenderness.  Musculoskeletal:        General: No swelling.     Cervical back: Neck supple.  Skin:    General: Skin is warm and dry.     Capillary Refill: Capillary refill takes less than 2 seconds.  Neurological:     Mental Status: He is alert.  Psychiatric:        Mood and Affect: Mood normal.     (all labs ordered are listed, but only abnormal results are displayed) Labs Reviewed  CBC WITH DIFFERENTIAL/PLATELET - Abnormal; Notable for the following components:      Result Value   RBC 3.68 (*)    Hemoglobin 11.0 (*)    HCT 33.2 (*)    RDW 17.7 (*)    Platelets 854 (*)    Basophils Absolute 0.2 (*)    All other components within normal limits  COMPREHENSIVE METABOLIC PANEL WITH GFR - Abnormal; Notable for the following components:   Sodium 134 (*)    Chloride 93 (*)    Albumin 3.2 (*)    All other components within normal limits    EKG: EKG Interpretation Date/Time:  Friday March 04 2024 18:43:32 EDT Ventricular Rate:  79 PR Interval:  195 QRS  Duration:  95 QT Interval:  388 QTC Calculation: 445 R Axis:   66  Text Interpretation: Sinus rhythm Probable lateral infarct, age indeterminate Confirmed by Simon Rea [phone]) on 03/04/2024 8:10:23 PM  Radiology: CT Chest Wo Contrast Result Date: 03/04/2024 EXAM: CT CHEST WITHOUT CONTRAST 03/04/2024 08:12:50 PM TECHNIQUE: CT of the chest was performed without the administration of intravenous contrast. Multiplanar reformatted images are provided for review. Automated exposure control, iterative reconstruction, and/or weight based adjustment of the mA/kV was utilized to reduce the  radiation dose to as low as reasonably achievable. COMPARISON: Comparison to chest x-ray today and CT on 02/22/2024. CLINICAL HISTORY: Better eval atelectasis vs developing pneumonia. FINDINGS: MEDIASTINUM: Scattered aortic atherosclerosis. LYMPH NODES: No mediastinal, hilar or axillary lymphadenopathy. LUNGS AND PLEURA: There is a small right pleural effusion. A rounded airspace opacity with central cavitation in the superior segment of the right lower lobe is unchanged from the prior study and likely reflects contusion, although atypical infection cannot be completely excluded. Ground glass and linear opacities peripherally in the right lower lobe favor atelectasis. No pneumothorax. SOFT TISSUES/BONES: Posterolateral right 7th and 8th rib fractures are noted. UPPER ABDOMEN: Limited images of the upper abdomen demonstrate no acute abnormality. IMPRESSION: 1. Rounded cavitary airspace opacity in the superior segment of the right lower lobe, unchanged and favored contusion; atypical infection remains a consideration. 2. Small right pleural effusion. 3. Posterolateral right 7th and 8th rib fractures. 4. Peripheral right lower lobe ground glass and linear opacities, favoring atelectasis. Electronically signed by: Franky Crease MD 03/04/2024 08:22 PM EDT RP Workstation: [id number]   DG Chest Portable 1 View Result Date:  03/04/2024 CLINICAL DATA:  History of right pneumothorax, prior abnormal chest x-ray EXAM: PORTABLE CHEST 1 VIEW COMPARISON:  02/25/2024 FINDINGS: 2 frontal views of the chest demonstrate an unremarkable cardiac silhouette. There is patchy bibasilar consolidation, right greater than left, which could reflect atelectasis or airspace disease. No effusion or pneumothorax. No acute bony abnormalities. IMPRESSION: 1. No evidence of residual or recurrent pneumothorax. 2. Patchy bibasilar consolidation, which may reflect hypoventilatory changes or developing airspace disease. Electronically Signed   By: Ozell Daring M.D.   On: 03/04/2024 19:18     Procedures   Medications Ordered in the ED  lidocaine  (LIDODERM ) 5 % 1 patch (1 patch Transdermal Patch Applied 03/04/24 2114)  sodium chloride  0.9 % bolus 1,000 mL (1,000 mLs Intravenous New Bag/Given 03/04/24 2115)  ketorolac  (TORADOL ) 15 MG/ML injection 15 mg (15 mg Intravenous Given 03/04/24 2114)                                    Medical Decision Making Amount and/or Complexity of Data Reviewed Labs: ordered. Radiology: ordered.  Risk Prescription drug management.     HPI:    Presents because of intoxication.  Brought in by EMS.  EMS was called by please department.  Patient threatening to fight people.  Patient found to be intoxicated was brought to the ED for further evaluation and  Denies any recent falls.  Denies any headaches.  Patient does endorse drinking about 1/5 of liquor today.  Endorsing right-sided chest pain from his previous rib fractures and chest tube.  No fever no chills.  No shortness of breath.  Patient states that he was given small dose of oxycodone  but subsequently ran out of this medication for pain so has been drinking.  No midsternal chest pain.  No pleuritic chest pain or hemoptysis.  No history of DVT or PE.  Denies all other complaints.  Adamant that he had no recent falls.   Previous medical history  reviewed : Patient discharged on February 26, 2024.  Was discharged after rib fractures and right pneumothorax status post pigtail.  MDM:    Upon exam, patient ANO x 3 GCS 15.  Intoxicated but protecting airway and mentating well.  No signs of any kind of new trauma.  No bruising to the head.  No new  bruising to the chest or abdomen.  No cervical thoracic or lumbar pain to palpation.  Will obtain laboratory workup.  Obtain EKG as well as cardiac telemetry.  Will also obtain chest x-ray to make sure there is no evidence of pneumothorax or pneumonia on the right side.  Reevaluation:   Upon reexamination, patient hemodynamically stable.  Remains A&O x 3 with GCS 15.  X-ray showed small infection versus atelectasis.  Given O2 requirement, did obtain CT scan without contrast.  CT scan shows probably atelectasis.  No obvious infection at this time.  Would not start the patient antibiotics.  Trial the patient on room air.  Drop in saturations to the mid to low 80s.  Therefore, does need observation.  Likely needs respiratory therapy.  Having a lot of pain over this area and likely contributing to atelectasis.  Admitted for further care.   EKG Interpreted by Me: NSR   Cardiac Tele Interpreted by Me: NSR    I have independently interpreted the CXR  and CT  images and agree with the radiologist finding   Social Determinant of Health: Alcohol  abuse    Disposition and Follow Up: admit       Final diagnoses:  Atelectasis  Rib pain  Alcoholic intoxication without complication    ED Discharge Orders     None          Simon Lavonia SAILOR, MD 03/04/24 2232

## 2024-03-04 NOTE — ED Notes (Signed)
Pt given sandwich and water, okay per MD.

## 2024-03-05 DIAGNOSIS — S2249XA Multiple fractures of ribs, unspecified side, initial encounter for closed fracture: Secondary | ICD-10-CM | POA: Diagnosis present

## 2024-03-05 DIAGNOSIS — J9601 Acute respiratory failure with hypoxia: Secondary | ICD-10-CM | POA: Diagnosis not present

## 2024-03-05 LAB — CBC
HCT: 32.5 % — ABNORMAL LOW (ref 39.0–52.0)
Hemoglobin: 10.7 g/dL — ABNORMAL LOW (ref 13.0–17.0)
MCH: 30.1 pg (ref 26.0–34.0)
MCHC: 32.9 g/dL (ref 30.0–36.0)
MCV: 91.3 fL (ref 80.0–100.0)
Platelets: 897 K/uL — ABNORMAL HIGH (ref 150–400)
RBC: 3.56 MIL/uL — ABNORMAL LOW (ref 4.22–5.81)
RDW: 18.2 % — ABNORMAL HIGH (ref 11.5–15.5)
WBC: 5.4 K/uL (ref 4.0–10.5)
nRBC: 0 % (ref 0.0–0.2)

## 2024-03-05 LAB — PROTIME-INR
INR: 1 (ref 0.8–1.2)
Prothrombin Time: 13.6 s (ref 11.4–15.2)

## 2024-03-05 LAB — BASIC METABOLIC PANEL WITH GFR
Anion gap: 13 (ref 5–15)
BUN: 11 mg/dL (ref 6–20)
CO2: 25 mmol/L (ref 22–32)
Calcium: 9 mg/dL (ref 8.9–10.3)
Chloride: 96 mmol/L — ABNORMAL LOW (ref 98–111)
Creatinine, Ser: 0.71 mg/dL (ref 0.61–1.24)
GFR, Estimated: >60 mL/min (ref >60)
Glucose, Bld: 73 mg/dL (ref 70–99)
Potassium: 4.4 mmol/L (ref 3.5–5.1)
Sodium: 134 mmol/L — ABNORMAL LOW (ref 135–145)

## 2024-03-05 MED ORDER — ONDANSETRON HCL 4 MG PO TABS: 4.0000 mg | ORAL_TABLET | Freq: Four times a day (QID) | ORAL | Status: DC | PRN

## 2024-03-05 MED ORDER — ESCITALOPRAM OXALATE 10 MG PO TABS
10.0000 mg | ORAL_TABLET | Freq: Every day | ORAL | Status: DC
  Administered 2024-03-05 – 2024-03-08 (×4): 10 mg via ORAL
  Filled 2024-03-05 (×4): qty 1

## 2024-03-05 MED ORDER — ONDANSETRON HCL 4 MG/2ML IJ SOLN
4.0000 mg | Freq: Four times a day (QID) | INTRAMUSCULAR | Status: DC | PRN
  Administered 2024-03-07: 4 mg via INTRAVENOUS
  Filled 2024-03-05: qty 2

## 2024-03-05 MED ORDER — LORAZEPAM 1 MG PO TABS
1.0000 mg | ORAL_TABLET | ORAL | Status: AC | PRN
  Administered 2024-03-05: 1 mg via ORAL
  Administered 2024-03-05: 2 mg via ORAL
  Administered 2024-03-05: 1 mg via ORAL
  Filled 2024-03-05 (×2): qty 1
  Filled 2024-03-05: qty 2

## 2024-03-05 MED ORDER — LORAZEPAM 2 MG/ML IJ SOLN
1.0000 mg | INTRAMUSCULAR | Status: AC | PRN
  Administered 2024-03-05 – 2024-03-07 (×2): 2 mg via INTRAVENOUS
  Filled 2024-03-05 (×2): qty 1

## 2024-03-05 MED ORDER — OXYCODONE HCL 5 MG PO TABS
5.0000 mg | ORAL_TABLET | ORAL | Status: DC | PRN
  Administered 2024-03-05: 5 mg via ORAL
  Filled 2024-03-05: qty 1

## 2024-03-05 MED ORDER — ACETAMINOPHEN 650 MG RE SUPP: 650.0000 mg | Freq: Four times a day (QID) | RECTAL | Status: DC | PRN

## 2024-03-05 MED ORDER — THIAMINE HCL 100 MG/ML IJ SOLN
100.0000 mg | Freq: Every day | INTRAMUSCULAR | Status: DC
  Administered 2024-03-06: 100 mg via INTRAVENOUS
  Filled 2024-03-05: qty 2

## 2024-03-05 MED ORDER — ACETAMINOPHEN 325 MG PO TABS: 650.0000 mg | ORAL_TABLET | Freq: Four times a day (QID) | ORAL | Status: DC | PRN

## 2024-03-05 MED ORDER — ADULT MULTIVITAMIN W/MINERALS CH
1.0000 | ORAL_TABLET | Freq: Every day | ORAL | Status: DC
  Administered 2024-03-05 – 2024-03-08 (×4): 1 via ORAL
  Filled 2024-03-05 (×4): qty 1

## 2024-03-05 MED ORDER — FOLIC ACID 1 MG PO TABS
1.0000 mg | ORAL_TABLET | Freq: Every day | ORAL | Status: DC
  Administered 2024-03-05 – 2024-03-08 (×4): 1 mg via ORAL
  Filled 2024-03-05 (×4): qty 1

## 2024-03-05 MED ORDER — OXYCODONE HCL 5 MG PO TABS
10.0000 mg | ORAL_TABLET | ORAL | Status: DC | PRN
  Administered 2024-03-05 – 2024-03-08 (×6): 10 mg via ORAL
  Filled 2024-03-05 (×6): qty 2

## 2024-03-05 MED ORDER — ENOXAPARIN SODIUM 40 MG/0.4ML IJ SOSY
40.0000 mg | PREFILLED_SYRINGE | Freq: Every day | INTRAMUSCULAR | Status: DC
  Administered 2024-03-05 – 2024-03-08 (×4): 40 mg via SUBCUTANEOUS
  Filled 2024-03-05 (×4): qty 0.4

## 2024-03-05 MED ORDER — NICOTINE 14 MG/24HR TD PT24
14.0000 mg | MEDICATED_PATCH | Freq: Every day | TRANSDERMAL | Status: DC
  Administered 2024-03-05 – 2024-03-08 (×4): 14 mg via TRANSDERMAL
  Filled 2024-03-05 (×4): qty 1

## 2024-03-05 MED ORDER — THIAMINE MONONITRATE 100 MG PO TABS
100.0000 mg | ORAL_TABLET | Freq: Every day | ORAL | Status: DC
  Administered 2024-03-05 – 2024-03-08 (×3): 100 mg via ORAL
  Filled 2024-03-05 (×4): qty 1

## 2024-03-05 NOTE — Plan of Care (Signed)
 SABRA

## 2024-03-05 NOTE — Progress Notes (Signed)
  PROGRESS NOTE  Patient admitted earlier this morning. See H&P.   Patient admitted with chest pain.  He was recently discharged from the hospital after multiple rib fractures requiring chest tube.  Due to his severe alcohol  abuse, was discharged on phenobarbital  taper.  He states that he ran out of pain medication on Thursday morning.  Due to this, he increased his alcohol  intake from beer to vodka.  Due to continued chest pain and productive cough, he presented to the hospital.  Pain control -states that 5 mg oxycodone  is not helping, increased to 10 mg today Incentive spirometer CIWA protocol Nicotine  patch    Status is: Observation The patient will require care spanning > 2 midnights and should be moved to inpatient because: Pain control, alcohol  withdrawal   Delon Hoe, DO [facility] 03/05/2024, 1:50 PM  Available via Epic secure chat 7am-7pm After these hours, please refer to coverage provider listed on [email or web address]

## 2024-03-05 NOTE — H&P (Signed)
 History and Physical    Gary Mclaughlin FMW:[id number] DOB: [birth year] DOA: 03/04/2024  PCP: Pcp, No   Chief Complaint: Chest pain  HPI: Gary Mclaughlin is a 53 y.o. male with medical history significant of recent admission for rib fractures, alcohol  abuse, alcohol -related withdrawal who presented to the emergency department with pain. He Was discharged on 10/24.  He walked out of the back of a box truck and fell.  He was found to have multiple rib fractures and required a chest tube as well as supportive care with no pneumothorax.  He has severe alcohol  abuse and was discharged on phenobarbital  taper.  Since discharge patient was unable to afford his medications and states he had severe chest pain.  His symptoms continued to worsen so he presented to the ER for further assessment.  On arrival he was afebrile and hemodynamically stable.  Labs were obtained which showed WBC 8.1, hemoglobin 11.0, platelets 854, sodium 134.  CT chest was obtained which showed cavitary airspace opacity in right lower lobe on changed and thought to be a contusion.  There is also a small effusion.  Patient was found to be hypoxic in the 80s.  He was admitted for further workup.  On admission he had pulled out his IV and had intermittent episodes of hypotension.  He was completely alert and oriented and denied any other complaints.  Review of Systems: Review of Systems  Constitutional: Negative.  Negative for chills and fever.  HENT: Negative.    Eyes: Negative.   Respiratory: Negative.    Cardiovascular: Negative.   Gastrointestinal: Negative.   Genitourinary: Negative.   Musculoskeletal: Negative.   Skin: Negative.      As per HPI otherwise 10 point review of systems negative.   No Known Allergies  Past Medical History:  Diagnosis Date   Depression    ETOH abuse    Hepatic steatosis 10/30/2021   Seizure (HCC)    Subarachnoid hemorrhage (HCC) 07/28/2017   Tobacco abuse 10/27/2021    Past Surgical  History:  Procedure Laterality Date   ORIF PELVIC FRACTURE WITH PERCUTANEOUS SCREWS Left 04/17/2022   Procedure: SI SCREW AND ORIF SYMPHYSIS PELVIC RING;  Surgeon: Kendal Franky SQUIBB, MD;  Location: [facility] OR;  Service: Orthopedics;  Laterality: Left;     reports that he has quit smoking. His smoking use included cigarettes. He has a 25 pack-year smoking history. He has never used smokeless tobacco. He reports that he does not currently use alcohol . He reports that he does not use drugs.  Family History  Problem Relation Age of Onset   Hypertension Other     Prior to Admission medications   Medication Sig Start Date End Date Taking? Authorizing Provider  acetaminophen  (TYLENOL ) 500 MG tablet Take 2 tablets (1,000 mg total) by mouth every 6 (six) hours as needed for mild pain (pain score 1-3), fever or headache. 02/26/24   Vicci Burnard SAUNDERS, PA-C  albuterol  (VENTOLIN  HFA) 108 (90 Base) MCG/ACT inhaler Inhale 2 puffs into the lungs every 6 (six) hours as needed for wheezing or shortness of breath. 02/26/24   Vicci Burnard SAUNDERS, PA-C  budesonide-formoterol  (SYMBICORT) 160-4.5 MCG/ACT inhaler Inhale 2 puffs into the lungs 2 (two) times daily. 02/26/24   Vicci Burnard SAUNDERS, PA-C  cetirizine  (ZYRTEC  ALLERGY) 10 MG tablet Take 1 tablet (10 mg total) by mouth daily. 02/26/24   Vicci Burnard SAUNDERS, PA-C  escitalopram (LEXAPRO) 10 MG tablet Take 1 tablet (10 mg total) by mouth daily. 02/26/24   Vicci Burnard  R, PA-C  gabapentin  (NEURONTIN ) 300 MG capsule Take 1 capsule (300 mg total) by mouth 3 (three) times daily. 02/26/24   Vicci Burnard SAUNDERS, PA-C  guaiFENesin  (MUCINEX ) 600 MG 12 hr tablet Take 1 tablet (600 mg total) by mouth 2 (two) times daily as needed for to loosen phlegm. 02/26/24   Vicci Burnard SAUNDERS, PA-C  hydrOXYzine  (ATARAX ) 25 MG tablet Take 1 tablet (25 mg total) by mouth 3 (three) times daily as needed for anxiety. 02/26/24   Vicci Burnard SAUNDERS, PA-C  ibuprofen  (ADVIL ) 600 MG tablet Take 1 tablet (600 mg  total) by mouth every 6 (six) hours. TAKE WITH FOOD 02/26/24   Vicci Burnard SAUNDERS, PA-C  lidocaine  (LIDODERM ) 5 % Place 1 patch onto the skin daily. Remove & Discard patch within 12 hours or as directed by MD 02/27/24   Vicci Burnard SAUNDERS, PA-C  methocarbamol  (ROBAXIN ) 500 MG tablet Take 2 tablets (1,000 mg total) by mouth every 8 (eight) hours as needed for muscle spasms. 02/26/24   Vicci Burnard SAUNDERS, PA-C  nicotine  (NICODERM CQ  - DOSED IN MG/24 HOURS) 14 mg/24hr patch Place 1 patch (14 mg total) onto the skin daily. 02/27/24   Vicci Burnard SAUNDERS, PA-C  oxyCODONE  (OXY IR/ROXICODONE ) 5 MG immediate release tablet Take 1 tablet (5 mg total) by mouth every 4 (four) hours as needed for moderate pain (pain score 4-6). 02/26/24   Vicci Burnard SAUNDERS, PA-C  PHENobarbital  (LUMINAL) 32.4 MG tablet Take 2 tablets (64.8 mg total) by mouth every 8 (eight) hours for 1 day, THEN 1 tablet (32.4 mg total) every 8 (eight) hours for 3 days. 02/26/24 03/01/24  Vicci Burnard SAUNDERS, PA-C    Physical Exam: Vitals:   03/05/24 0000 03/05/24 0015 03/05/24 0030 03/05/24 0045  BP: (!) 92/55 (!) 92/54 (!) 85/62 (!) 86/62  Pulse: 86 86 83 86  Resp: 18 (!) 22 15 20   Temp:      SpO2: 94% (!) 88% 96% 91%   Physical Exam Vitals reviewed.  Constitutional:      Appearance: He is normal weight.  HENT:     Head: Normocephalic.     Nose: Nose normal.     Mouth/Throat:     Mouth: Mucous membranes are moist.     Pharynx: Oropharynx is clear.  Eyes:     Conjunctiva/sclera: Conjunctivae normal.     Pupils: Pupils are equal, round, and reactive to light.  Cardiovascular:     Rate and Rhythm: Normal rate and regular rhythm.     Pulses: Normal pulses.     Heart sounds: Normal heart sounds.  Pulmonary:     Effort: Pulmonary effort is normal.     Breath sounds: Normal breath sounds.  Abdominal:     General: Abdomen is flat. Bowel sounds are normal.     Palpations: Abdomen is soft.  Musculoskeletal:        General: Normal range of  motion.     Cervical back: Normal range of motion.  Skin:    General: Skin is warm.     Capillary Refill: Capillary refill takes less than 2 seconds.  Neurological:     General: No focal deficit present.     Mental Status: He is alert.  Psychiatric:        Mood and Affect: Mood normal.       Labs on Admission: I have personally reviewed the patients's labs and imaging studies.  Assessment/Plan Principal Problem:   Hypoxic respiratory failure (HCC) Active Problems:  Rib fractures   # Acute hypoxic respiratory failure most likely secondary to atelectasis in setting of rib fractures - Patient recently presented to the hospital -Has had persistent pain with breathing which has likely caused worsening atelectasis  Plan: Lidocaine  patch Supportive care with pain control RT eval and treat  # Severe alcohol  abuse-patient was intoxicated on arrival.  Has a history of alcohol  withdrawal seizures.  Was not compliant with phenobarbital  taper at discharge.  Will plan to place back on CIWA protocol    Admission status: Observation Progressive  Certification: The appropriate patient status for this patient is OBSERVATION. Observation status is judged to be reasonable and necessary in order to provide the required intensity of service to ensure the patient's safety. The patient's presenting symptoms, physical exam findings, and initial radiographic and laboratory data in the context of their medical condition is felt to place them at decreased risk for further clinical deterioration. Furthermore, it is anticipated that the patient will be medically stable for discharge from the hospital within 2 midnights of admission.     Lamar Dess MD [facility] If 7PM-7AM, please contact night-coverage [email or web address]  03/05/2024, 12:58 AM

## 2024-03-06 DIAGNOSIS — J9601 Acute respiratory failure with hypoxia: Secondary | ICD-10-CM | POA: Diagnosis not present

## 2024-03-06 MED ORDER — POLYETHYLENE GLYCOL 3350 17 G PO PACK
17.0000 g | PACK | Freq: Two times a day (BID) | ORAL | Status: AC
  Administered 2024-03-06 – 2024-03-07 (×3): 17 g via ORAL
  Filled 2024-03-06 (×3): qty 1

## 2024-03-06 MED ORDER — KETOROLAC TROMETHAMINE 15 MG/ML IJ SOLN
15.0000 mg | Freq: Three times a day (TID) | INTRAMUSCULAR | Status: AC
  Administered 2024-03-06 – 2024-03-07 (×4): 15 mg via INTRAVENOUS
  Filled 2024-03-06 (×5): qty 1

## 2024-03-06 MED ORDER — LACTATED RINGERS IV SOLN: INTRAVENOUS | Status: AC

## 2024-03-06 MED ORDER — PANTOPRAZOLE SODIUM 40 MG PO TBEC
40.0000 mg | DELAYED_RELEASE_TABLET | Freq: Every day | ORAL | Status: DC
  Administered 2024-03-06 – 2024-03-08 (×3): 40 mg via ORAL
  Filled 2024-03-06 (×3): qty 1

## 2024-03-06 MED ORDER — CHLORDIAZEPOXIDE HCL 5 MG PO CAPS
10.0000 mg | ORAL_CAPSULE | Freq: Three times a day (TID) | ORAL | Status: DC
  Administered 2024-03-06 – 2024-03-07 (×3): 10 mg via ORAL
  Filled 2024-03-06 (×3): qty 2

## 2024-03-06 MED ORDER — ACETAMINOPHEN 325 MG PO TABS
650.0000 mg | ORAL_TABLET | Freq: Four times a day (QID) | ORAL | Status: AC
  Administered 2024-03-06 – 2024-03-08 (×8): 650 mg via ORAL
  Filled 2024-03-06 (×8): qty 2

## 2024-03-06 NOTE — Plan of Care (Signed)
°  Problem: Education: Goal: Knowledge of General Education information will improve Description: Including pain rating scale, medication(s)/side effects and non-pharmacologic comfort measures Outcome: Progressing   Problem: Clinical Measurements: Goal: Will remain free from infection Outcome: Progressing Goal: Diagnostic test results will improve Outcome: Progressing   Problem: Nutrition: Goal: Adequate nutrition will be maintained Outcome: Progressing   Problem: Safety: Goal: Ability to remain free from injury will improve Outcome: Progressing   Problem: Skin Integrity: Goal: Risk for impaired skin integrity will decrease Outcome: Progressing

## 2024-03-06 NOTE — Progress Notes (Signed)
 PROGRESS NOTE    Gary Mclaughlin  FMW:[id number] DOB: [birth year] DOA: 03/04/2024 PCP: Pcp, No    No chief complaint on file.   Brief Narrative:   Gary Mclaughlin is a 53 y.o. male with medical history significant of recent admission for rib fractures, alcohol  abuse, alcohol -related withdrawal who presented to the emergency department with pain. He Was discharged on 10/24.  He walked out of the back of a box truck and fell.  He was found to have multiple rib fractures and required a chest tube as well as supportive care with no pneumothorax.  He has severe alcohol  abuse and was discharged on phenobarbital  taper.  In ED CT chest was obtained which showed cavitary airspace opacity in right lower lobe on changed and thought to be a contusion.  There is also a small effusion.  Patient was found to be hypoxic in the 80s.  He was admitted for further workup.  On admission he had pulled out his IV and had intermittent episodes of hypotension.  He was completely alert and oriented and denied any other complaints.  Assessment & Plan:   Principal Problem:   Hypoxic respiratory failure (HCC) Active Problems:   Rib fractures   Acute hypoxic respiratory failure most likely secondary to atelectasis in setting of rib fractures Multiple rib fractures -Again he was encouraged to use incentive spirometer, wean oxygen as tolerated -This is secondary to rib fracture and poor inspiratory effort as imaging with significant atelectasis -Will start on scheduled Tylenol , and scheduled IV Toradol  for pain control during inspiratory efforts and incentive spirometer use. - Continue with lido Derm patch. - RT eval and treat   Severe alcohol  abuse with early DTs -He is intoxicated on arrival - Had some confusion and tremors yesterday, high risk for withdrawals given alcohol  abuse  - Continue with CIWA protocol, required as needed Ativan  dosing time 5 yesterday so I will start him on dose Librium  to prevent further  withdrawals-.  Continue with thiamine  and folic acid   Hyponatremia - Improving with IV fluids  Thrombocytosis - Much increased from recent admission, was 396K on discharge, it is 897K currently, - Volume depletion, dehydration, continue with IV fluids -  DVT prophylaxis: (Lovenox ) Code Status: (Full) Family Communication: None at bedside  Status is: Inpatient    Consultants:  None    Subjective:  Still reports pain,  Objective: Vitals:   03/06/24 0616 03/06/24 0800 03/06/24 0804 03/06/24 1124  BP: (!) 143/98  118/76 139/87  Pulse: 77 94 90   Resp:   18   Temp:   98.2 F (36.8 C) 98.3 F (36.8 C)  TempSrc:   Oral Oral  SpO2:      Weight:      Height:        Intake/Output Summary (Last 24 hours) at 03/06/2024 1305 Last data filed at 03/05/2024 1944 Gross per 24 hour  Intake --  Output 400 ml  Net -400 ml   Filed Weights   03/05/24 1330  Weight: 69.3 kg    Examination:  Awake Alert, Oriented X 3, mild tremors present Symmetrical Chest wall movement, diminished air entry at the bases, with rales  Regular rate and rhythm Abdomen soft No Cyanosis, Clubbing or edema, No new Rash or bruise       Data Reviewed: I have personally reviewed following labs and imaging studies  CBC: Recent Labs  Lab 03/04/24 2048 03/05/24 0500  WBC 8.1 5.4  NEUTROABS 5.1  --   HGB 11.0* 10.7*  HCT 33.2* 32.5*  MCV 90.2 91.3  PLT 854* 897*    Basic Metabolic Panel: Recent Labs  Lab 03/04/24 2048 03/05/24 0500  NA 134* 134*  K 4.2 4.4  CL 93* 96*  CO2 27 25  GLUCOSE 91 73  BUN 10 11  CREATININE 0.71 0.71  CALCIUM 9.4 9.0    GFR: Estimated Creatinine Clearance: 103.3 mL/min (by C-G formula based on SCr of 0.71 mg/dL).  Liver Function Tests: Recent Labs  Lab 03/04/24 2048  AST 40  ALT 27  ALKPHOS 101  BILITOT <0.2  PROT 6.9  ALBUMIN 3.2*    CBG: No results for input(s): GLUCAP in the last 168 hours.   No results found for this or any  previous visit (from the past 240 hours).       Radiology Studies: CT Chest Wo Contrast Result Date: 03/04/2024 EXAM: CT CHEST WITHOUT CONTRAST 03/04/2024 08:12:50 PM TECHNIQUE: CT of the chest was performed without the administration of intravenous contrast. Multiplanar reformatted images are provided for review. Automated exposure control, iterative reconstruction, and/or weight based adjustment of the mA/kV was utilized to reduce the radiation dose to as low as reasonably achievable. COMPARISON: Comparison to chest x-ray today and CT on 02/22/2024. CLINICAL HISTORY: Better eval atelectasis vs developing pneumonia. FINDINGS: MEDIASTINUM: Scattered aortic atherosclerosis. LYMPH NODES: No mediastinal, hilar or axillary lymphadenopathy. LUNGS AND PLEURA: There is a small right pleural effusion. A rounded airspace opacity with central cavitation in the superior segment of the right lower lobe is unchanged from the prior study and likely reflects contusion, although atypical infection cannot be completely excluded. Ground glass and linear opacities peripherally in the right lower lobe favor atelectasis. No pneumothorax. SOFT TISSUES/BONES: Posterolateral right 7th and 8th rib fractures are noted. UPPER ABDOMEN: Limited images of the upper abdomen demonstrate no acute abnormality. IMPRESSION: 1. Rounded cavitary airspace opacity in the superior segment of the right lower lobe, unchanged and favored contusion; atypical infection remains a consideration. 2. Small right pleural effusion. 3. Posterolateral right 7th and 8th rib fractures. 4. Peripheral right lower lobe ground glass and linear opacities, favoring atelectasis. Electronically signed by: Franky Crease MD 03/04/2024 08:22 PM EDT RP Workstation: [id number]   DG Chest Portable 1 View Result Date: 03/04/2024 CLINICAL DATA:  History of right pneumothorax, prior abnormal chest x-ray EXAM: PORTABLE CHEST 1 VIEW COMPARISON:  02/25/2024 FINDINGS: 2 frontal  views of the chest demonstrate an unremarkable cardiac silhouette. There is patchy bibasilar consolidation, right greater than left, which could reflect atelectasis or airspace disease. No effusion or pneumothorax. No acute bony abnormalities. IMPRESSION: 1. No evidence of residual or recurrent pneumothorax. 2. Patchy bibasilar consolidation, which may reflect hypoventilatory changes or developing airspace disease. Electronically Signed   By: Ozell Daring M.D.   On: 03/04/2024 19:18        Scheduled Meds:  acetaminophen   650 mg Oral Q6H   chlordiazePOXIDE   10 mg Oral TID   enoxaparin  (LOVENOX ) injection  40 mg Subcutaneous Daily   escitalopram  10 mg Oral Daily   folic acid   1 mg Oral Daily   ketorolac   15 mg Intravenous Q8H   lidocaine   1 patch Transdermal QHS   multivitamin with minerals  1 tablet Oral Daily   nicotine   14 mg Transdermal Daily   pantoprazole   40 mg Oral Daily   thiamine   100 mg Oral Daily   Or   thiamine   100 mg Intravenous Daily   Continuous Infusions:  lactated ringers   LOS: 2 days        Brayton Lye, MD [facility]   To contact the attending provider between 7A-7P or the covering provider during after hours 7P-7A, please log into the web site [email or web address] and access using universal [place] password for that web site. If you do not have the password, please call the hospital operator.  03/06/2024, 1:05 PM

## 2024-03-06 NOTE — Plan of Care (Signed)
 Gary Mclaughlin

## 2024-03-07 DIAGNOSIS — J9601 Acute respiratory failure with hypoxia: Secondary | ICD-10-CM | POA: Diagnosis not present

## 2024-03-07 LAB — CBC
HCT: 32.8 % — ABNORMAL LOW (ref 39.0–52.0)
Hemoglobin: 10.9 g/dL — ABNORMAL LOW (ref 13.0–17.0)
MCH: 29.9 pg (ref 26.0–34.0)
MCHC: 33.2 g/dL (ref 30.0–36.0)
MCV: 90.1 fL (ref 80.0–100.0)
Platelets: 852 K/uL — ABNORMAL HIGH (ref 150–400)
RBC: 3.64 MIL/uL — ABNORMAL LOW (ref 4.22–5.81)
RDW: 17.3 % — ABNORMAL HIGH (ref 11.5–15.5)
WBC: 7.6 K/uL (ref 4.0–10.5)
nRBC: 0 % (ref 0.0–0.2)

## 2024-03-07 LAB — BASIC METABOLIC PANEL WITH GFR
Anion gap: 11 (ref 5–15)
BUN: 16 mg/dL (ref 6–20)
CO2: 26 mmol/L (ref 22–32)
Calcium: 10 mg/dL (ref 8.9–10.3)
Chloride: 94 mmol/L — ABNORMAL LOW (ref 98–111)
Creatinine, Ser: 0.69 mg/dL (ref 0.61–1.24)
GFR, Estimated: >60 mL/min (ref >60)
Glucose, Bld: 91 mg/dL (ref 70–99)
Potassium: 4 mmol/L (ref 3.5–5.1)
Sodium: 131 mmol/L — ABNORMAL LOW (ref 135–145)

## 2024-03-07 LAB — PROCALCITONIN: Procalcitonin: <0.1 ng/mL

## 2024-03-07 LAB — PHOSPHORUS: Phosphorus: 5.2 mg/dL — ABNORMAL HIGH (ref 2.5–4.6)

## 2024-03-07 LAB — C-REACTIVE PROTEIN: CRP: 0.6 mg/dL (ref <1.0)

## 2024-03-07 LAB — MAGNESIUM: Magnesium: 1.7 mg/dL (ref 1.7–2.4)

## 2024-03-07 MED ORDER — KETOROLAC TROMETHAMINE 15 MG/ML IJ SOLN
INTRAMUSCULAR | Status: AC
  Administered 2024-03-07: 15 mg via INTRAVENOUS
  Filled 2024-03-07: qty 1

## 2024-03-07 MED ORDER — CHLORDIAZEPOXIDE HCL 5 MG PO CAPS
15.0000 mg | ORAL_CAPSULE | Freq: Four times a day (QID) | ORAL | Status: DC
  Administered 2024-03-07 – 2024-03-08 (×4): 15 mg via ORAL
  Filled 2024-03-07 (×5): qty 3

## 2024-03-07 MED ORDER — LABETALOL HCL 5 MG/ML IV SOLN
10.0000 mg | INTRAVENOUS | Status: DC | PRN
  Administered 2024-03-07: 10 mg via INTRAVENOUS
  Filled 2024-03-07: qty 4

## 2024-03-07 NOTE — Plan of Care (Signed)
  Problem: Education: Goal: Knowledge of General Education information will improve Description: Including pain rating scale, medication(s)/side effects and non-pharmacologic comfort measures Outcome: Progressing   Problem: Clinical Measurements: Goal: Ability to maintain clinical measurements within normal limits will improve Outcome: Progressing Goal: Will remain free from infection Outcome: Progressing Goal: Diagnostic test results will improve Outcome: Progressing   Problem: Activity: Goal: Risk for activity intolerance will decrease Outcome: Progressing   Problem: Coping: Goal: Level of anxiety will decrease Outcome: Progressing

## 2024-03-07 NOTE — Discharge Instructions (Signed)
 [employer] assistance programs Crisis assistance programs  -[facility] Arts development officer. If you are experiencing homelessness in [place], [place], your first point of contact should be Pensions consultant. You can reach Coordinated Entry by calling (336) ([phone] or by emailing [email or web address] [email or web address].  Community access points: [employer] [phone], [place]) every Tuesday from 9am-10am. [facility] ([address] [place], [place]) every Wednesday from 8am-9am.   -[place] Coordinated Re-entry Marcy Panning: Dial 211 and request. Offers referrals to homeless shelters in the area.    -The [employer] 705-857-6775) offers several services to local families, as funding allows. The Emergency Assistance Program (EAP), which they administer, provides household goods, free food, clothing, and financial aid to people in need in the [employer] area. The EAP program does have some qualification, and counselors will interview clients for financial assistance by written referral only. Referrals need to be made by the Department of Social Services or by other EAP approved human services agencies or charities in the area.  -[facility] of [employer], which can be reached at [phone], offers emergency assistance programs for those in need of help, such as food, rent assistance, a soup kitchen, shelter, and clothing. They are based in [facility] but provide a number of services to those that qualify for assistance.   [facility] Department of Social Services may be able to offer temporary financial assistance and cash grants for paying rent and utilities, Help may be provided for local county residents who may be experiencing personal crisis when other resources, including government programs, are not available. Call [phone]  -High [employer] is a [employer] agency, The organization can offer emergency assistance for paying rent, Care[employer] Rx, utilities, food, household products and furniture. They offer extensive emergency and transitional housing for families, children and single women, and also run a Boy's and [employer]. Thrift Shops, Secondary school teacher, and other aid offered too. [address] [place], [place] [address], [phone]  -[place] Low Income Energy Assistance Program -- This is offered for [facility] families. The federal government created [employer] Program provides a one-time cash grant payment to help eligible low-income families pay their electric and heating bills. [address] [place], [place] [address], ([phone]  -[place] Emergency Assistance -- A program offers emergency utility and rent funds for greater [employer] area residents. The program can also provide counseling and referrals to charities and government programs. Also provides food and a free meal program that serves lunch Mondays - Saturdays and dinner seven days per week to individuals in the community. [address], [place], [place] [address], [phone]  -[employer] - Offers affordable apartment and housing communities across      [place] and [place]. The low income and seniors can access public housing, rental assistance to qualified applicants, and apply for the section 8 rent subsidy program. Other programs include Chiropractor and Engineer, maintenance. [address] [place], [place] [address], dial [phone].  -The [facility] provides transitional housing to veterans and the disabled. Clients will also access other services too, including assistance in applying for Disability, life skills classes, case management, and assistance in finding permanent housing. [address] [place], [place]  [place] [address], call [phone]  -[place] Transitional Housing through [facility] is for people who were just  evicted or that are formerly homeless. The non-profit will also help then gain self-sufficiency, find a home or apartment to live in, and also provides information on rent assistance when needed. Phone [phone]  -The [place] [facility] helps low income, elderly, or disabled residents in seven counties in the [place] ([place], [place], [place], [place], [place], [place], [place], and [place]) save energy and reduce their utility bills by improving energy efficiency. Phone [phone].  -[employer] is located in the [place] [facility] in the [employer], [address], [place], [place] [address]. Parking is in the rear of the building. Phone: [phone]   General Email: info@gsohc .org  [facility] provides free housing counseling assistance in locating affordable rental housing or housing with support services for families and individuals in crisis and the chronically homeless. We provide potential resources for other housing needs like utilities. Our trained counselors also work with clients on budgeting and financial literacy in effort to empower them to take control of their financial situations. [employer] collaborates with homeless service providers and other stakeholders as part of the [employer] COC (Continuum of Care). The (COC) is a regional/local planning body that coordinates housing and services funding for homeless families and individuals. The role of [facility] in the COC is through housing counseling to work with people we serve on diversion strategies for those that are at imminent risk of becoming homeless. We also work with the Coordinated Assessment/Entry Specialist who attempts to find temporary solutions and/or connects the people  to Housing First, Rapid Re-housing or transitional housing programs. Our Homelessness Prevention Housing Counselors meet with clients on business days (Monday-Fridays, except scheduled holidays) from 8:30 am to 4:30 pm.  Legal assistance for evictions, foreclosure, and more -If you need free legal advice on civil issues, such as foreclosures, evictions, Electronics engineer, government programs, domestic issues and more, Armed forces operational officer Aid of [facility]) is a Associate Professor firm that provides free legal services and counsel to lower income people, seniors, disabled, and others, The goal is to ensure everyone has access to justice and fair representation. Call them at [phone].  [facility] for Housing and Community Studies can provide info about obtaining legal assistance with evictions. Phone [phone].  Data processing manager  The [employer], [employer]. offers job and Dispensing optician. Resources are focused on helping students obtain the skills and experiences that are necessary to compete in today's challenging and tight job market. The non-profit faith-based community action agency offers internship trainings as well as classroom instruction. Classes are tailored to meet the needs of people in the [facility] region. [place], [place] [address], [phone]  Foreclosure prevention/Debt Services Family Services of the [employer] Credit Counseling Service inludes debt and foreclosure prevention programs for local families. This includes money management, financial advice, budget review and development of a written action plan with a Pensions consultant to help solve specific individual financial problems. In addition, housing and mortgage counselors can also provide pre- and post-purchase homeownership counseling, default resolution counseling (to prevent foreclosure) and reverse mortgage counseling. A Debt Management Program allows  people and families with a high level of credit card or medical debt to consolidate and repay consumer debt and loans to creditors and rebuild positive credit ratings and scores. Contact (336) [phone].  Community clinics in [place] -Health Department [facility]: [address] [place], [place], [address]. [phone].  -Health Department [place] [facility]: [phone], [facility], [address]. [phone].  -[facility] offers medical care through a group of doctors, pharmacies and other healthcare related agencies that offer services for low income, uninsured adults in [place]. Also offers adult Dental care and assistance with applying for an [employer]. Call [phone].   [facility]. This center provides low-cost health care to those without health insurance. Services offered include an onsite pharmacy. Phone [phone]. [address], [address], [place].  -Medication Assistance Program serves as a link between pharmaceutical companies and patients to provide low cost or free prescription medications. This service is available for residents who meet certain income restrictions and have no insurance coverage. PLEASE CALL [phone] Ginette Otto) OR [phone] ([place])  -One Step Further: Materials engineer, The [employer] Support & Nutrition Program, [employer]. Call [phone]/ [phone].  Food pantry and assistance -[facility]Food [facility]: [address], [place] [address]. Phone [phone]  -[facility] Food Pantry: [address] [place], [place] [address]. [phone].  -Missionary Ministry: has the purpose of visiting the sick and shut-ins and provide for needs in the surrounding communities. Call [phone]. Email: [email or web address] [email or web address] This program provides: Food box for seniors, Financial assistance, Food to meet basic  nutritional needs.  -Meals on Wheels with Senior Resources: [facility] residents age 53 and over who are homebound and unable to obtain and prepare a nutritious meal for themselves are eligible for this service. There may be a waiting list in certain parts of [facility] if the route in that area is full. If you are in [facility] and [place] call [phone] to register. For all other areas call [phone] to register.  -Greater Dietitian: https://findfood.BargainContractor.[email or web address]  TRANSPORTATION: -[employer] Department of Health: Call [facility] and [employer] at [phone] for details. AttractionGuides.es  -Access [place]: Access [place] is the [employer]'s shared-ride transportation service for eligible riders who have a disability that prevents them from riding the fixed route bus. Call ([phone]. Access [place] riders must pay a fare of $1.50 per trip, or may purchase a 10-ride punch card for $14.00 ($1.40 per ride) or a 40-ride punch card for $48.00 ($1.20 per ride).  -The [facility] WHEELS rideshare transportation service is provided for senior citizens (60+) who live independently within [place] city limits and are unable to drive or have limited access to transportation. Call [phone] to schedule an appointment.  -[place] Transportation: For Medicare or Medicaid recipients call [phone]?[facility], wheelchair Zenaida Niece, and ambulatory quotes available.   FLEEING VIOLENCE: -Family Services of the [place]- 24/7 Crisis line (520)[phone]) -[facility]: (336) 641-SAFE [phone])  [place] 2-1-1 is another useful way to locate resources in the community. Visit ShedSizes.ch to find service information online. If you need additional assistance, 2-1-1 Referral Specialists are available 24 hours a day, every day by dialing  2-1-1 or [phone] from any phone. The call is free, confidential, and available in any language.  Affordable Housing Search http://www.nchousingsearch.org  [facility])   M-F 8a-3p [address] [place], [place] [address] [phone] Services include: laundry, barbering, support groups, case management, phone & computer access, showers, AA/NA mtgs, mental health/substance abuse nurse, job skills class, disability information, VA assistance, spiritual classes, etc. Winter Shelter available when temperatures are less than 32 degrees.   HOMELESS SHELTERS [facility] at [facility]- Call [phone] ext. [phone]  or ext. 336. Located at [address], [place], [place] [address]  [facility] Mens Shelter- Call [phone]. Located at [address] [place] [address].  [facility]- [employer]. Call [phone]. Office located at [address] [place] [address].  [facility] Family Housing through [place] [phone].  [facility]- Call [phone]. Located at [address] [place], [place], [place] [address].  [facility]-For Pregnant mothers. Call [phone]. Located at [address] [place], [address].  [facility]-For men in [place]. Call [phone]. [facility]ter in [place]. Call [phone].  Home of [employer] for [employer] [phone]. Office located at [address] [place], [address].  [employer] be agreeable to help with chores. Call [phone] ext. [phone].  Men's: [address] [place],  [address]. Women's: [facility]  [address] [place], [place] [address]  Crisis Services [facility] Mobile Crisis Management- [phone]  [facility] [address], [place], [place] [address]. Phone: [phone]

## 2024-03-07 NOTE — Progress Notes (Signed)
 [facility] night cross cover note:   I was notified by the patient's RN that the patient's elevated systolic blood pressures into the 170s, with heart rates in the 80s to low 100s regarding this patient was hospitalized for pain control in the setting of rib fracture. RR 15-18; Oxygen saturations in the mid 90s on room air.   I subsequently added prn iv labetalol for SBP > 170 mmHg or DBP > 120 mmHg.      Eva Pore, DO Hospitalist

## 2024-03-07 NOTE — Progress Notes (Signed)
 PROGRESS NOTE    [name]  FMW:[id number] DOB: 19-Nov-1970 DOA: 03/04/2024 PCP: Pcp, No    No chief complaint on file.   Brief Narrative:   Gary Mclaughlin is a 53 y.o. male with medical history significant of recent admission for rib fractures, alcohol  abuse, alcohol -related withdrawal who presented to the emergency department with pain. He Was discharged on 10/24.  He walked out of the back of a box truck and fell.  He was found to have multiple rib fractures and required a chest tube as well as supportive care with no pneumothorax.  He has severe alcohol  abuse and was discharged on phenobarbital  taper.  In ED CT chest was obtained which showed cavitary airspace opacity in right lower lobe on changed and thought to be a contusion.  There is also a small effusion.  Patient was found to be hypoxic in the 80s.  He was admitted for further workup.  On admission he had pulled out his IV and had intermittent episodes of hypotension.  He was completely alert and oriented and denied any other complaints.  Assessment & Plan:   Principal Problem:   Hypoxic respiratory failure (HCC) Active Problems:   Rib fractures   Acute hypoxic respiratory failure most likely secondary to atelectasis in setting of rib fractures Multiple rib fractures -Again he was encouraged to use incentive spirometer, wean oxygen as tolerated -This is secondary to rib fracture and poor inspiratory effort as imaging with significant atelectasis -Will start on scheduled Tylenol , and scheduled IV Toradol  for pain control during inspiratory efforts and incentive spirometer use. - Continue with lido Derm patch. - RT eval and treat   Severe alcohol  abuse with early DTs -He is intoxicated on arrival - Had some confusion and tremors yesterday, high risk for withdrawals given alcohol  abuse  - Continue with CIWA protocol,  - Still appears restless, with tremors, blood pressure is elevated today, I will decrease his Librium  to  15 mg 4 times daily   Hyponatremia - Improving with IV fluids  Thrombocytosis - Much increased from recent admission, was 396K on discharge, it is 897K currently, - Volume depletion, dehydration, continue with IV fluids -  DVT prophylaxis: (Lovenox ) Code Status: (Full) Family Communication: None at bedside  Status is: Inpatient    Consultants:  None    Subjective:  Reports rib cage pain is controlled on current regimen  Objective: Vitals:   03/07/24 0206 03/07/24 0346 03/07/24 0827 03/07/24 1100  BP: (!) 150/95 (!) 146/97 (!) 149/93 125/78  Pulse:   84   Resp: 16 14 (!) 29   Temp:  97.7 F (36.5 C)  98 F (36.7 C)  TempSrc:  Oral  Oral  SpO2:   92%   Weight:      Height:        Intake/Output Summary (Last 24 hours) at 03/07/2024 1357 Last data filed at 03/07/2024 0012 Gross per 24 hour  Intake 840 ml  Output 1600 ml  Net -760 ml   Filed Weights   03/05/24 1330  Weight: 69.3 kg    Examination:  Awake Alert, Oriented X 3, tremors still present but improved Good air entry RRR +ve B.Sounds, Abd Soft No Cyanosis, Clubbing or edema, No new Rash or bruise       Data Reviewed: I have personally reviewed following labs and imaging studies  CBC: Recent Labs  Lab 03/04/24 2048 03/05/24 0500 03/07/24 0506  WBC 8.1 5.4 7.6  NEUTROABS 5.1  --   --  HGB 11.0* 10.7* 10.9*  HCT 33.2* 32.5* 32.8*  MCV 90.2 91.3 90.1  PLT 854* 897* 852*    Basic Metabolic Panel: Recent Labs  Lab 03/04/24 2048 03/05/24 0500 03/07/24 0506  NA 134* 134* 131*  K 4.2 4.4 4.0  CL 93* 96* 94*  CO2 27 25 26   GLUCOSE 91 73 91  BUN 10 11 16   CREATININE 0.71 0.71 0.69  CALCIUM 9.4 9.0 10.0  MG  --   --  1.7  PHOS  --   --  5.2*    GFR: Estimated Creatinine Clearance: 103.3 mL/min (by C-G formula based on SCr of 0.69 mg/dL).  Liver Function Tests: Recent Labs  Lab 03/04/24 2048  AST 40  ALT 27  ALKPHOS 101  BILITOT <0.2  PROT 6.9  ALBUMIN 3.2*     CBG: No results for input(s): GLUCAP in the last 168 hours.   No results found for this or any previous visit (from the past 240 hours).       Radiology Studies: No results found.       Scheduled Meds:  acetaminophen   650 mg Oral Q6H   chlordiazePOXIDE   15 mg Oral QID   enoxaparin  (LOVENOX ) injection  40 mg Subcutaneous Daily   escitalopram  10 mg Oral Daily   folic acid   1 mg Oral Daily   ketorolac   15 mg Intravenous Q8H   lidocaine   1 patch Transdermal QHS   multivitamin with minerals  1 tablet Oral Daily   nicotine   14 mg Transdermal Daily   pantoprazole   40 mg Oral Daily   thiamine   100 mg Oral Daily   Continuous Infusions:  lactated ringers  75 mL/hr at 03/07/24 0342     LOS: 3 days        Brayton Lye, MD [facility]   To contact the attending provider between 7A-7P or the covering provider during after hours 7P-7A, please log into the web site [email or web address] and access using universal [place] password for that web site. If you do not have the password, please call the hospital operator.  03/07/2024, 1:57 PM
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
## 2024-03-08 ENCOUNTER — Other Ambulatory Visit (HOSPITAL_COMMUNITY): Payer: Self-pay

## 2024-03-08 DIAGNOSIS — J9601 Acute respiratory failure with hypoxia: Secondary | ICD-10-CM | POA: Diagnosis not present

## 2024-03-08 LAB — BASIC METABOLIC PANEL WITH GFR
Anion gap: 9 (ref 5–15)
BUN: 19 mg/dL (ref 6–20)
CO2: 28 mmol/L (ref 22–32)
Calcium: 10.1 mg/dL (ref 8.9–10.3)
Chloride: 99 mmol/L (ref 98–111)
Creatinine, Ser: 0.92 mg/dL (ref 0.61–1.24)
GFR, Estimated: >60 mL/min (ref >60)
Glucose, Bld: 91 mg/dL (ref 70–99)
Potassium: 5.6 mmol/L — ABNORMAL HIGH (ref 3.5–5.1)
Sodium: 136 mmol/L (ref 135–145)

## 2024-03-08 LAB — CBC
HCT: 35.4 % — ABNORMAL LOW (ref 39.0–52.0)
Hemoglobin: 11.6 g/dL — ABNORMAL LOW (ref 13.0–17.0)
MCH: 30.4 pg (ref 26.0–34.0)
MCHC: 32.8 g/dL (ref 30.0–36.0)
MCV: 92.7 fL (ref 80.0–100.0)
Platelets: 823 K/uL — ABNORMAL HIGH (ref 150–400)
RBC: 3.82 MIL/uL — ABNORMAL LOW (ref 4.22–5.81)
RDW: 17.3 % — ABNORMAL HIGH (ref 11.5–15.5)
WBC: 8.9 K/uL (ref 4.0–10.5)
nRBC: 0 % (ref 0.0–0.2)

## 2024-03-08 LAB — MAGNESIUM: Magnesium: 1.9 mg/dL (ref 1.7–2.4)

## 2024-03-08 LAB — PHOSPHORUS: Phosphorus: 4.8 mg/dL — ABNORMAL HIGH (ref 2.5–4.6)

## 2024-03-08 LAB — POTASSIUM: Potassium: 4.4 mmol/L (ref 3.5–5.1)

## 2024-03-08 MED ORDER — FOLIC ACID 1 MG PO TABS
1.0000 mg | ORAL_TABLET | Freq: Every day | ORAL | 0 refills | Status: DC
  Filled 2024-03-08: qty 30, 30d supply, fill #0

## 2024-03-08 MED ORDER — OXYCODONE HCL 5 MG PO TABS
5.0000 mg | ORAL_TABLET | Freq: Four times a day (QID) | ORAL | 0 refills | Status: DC | PRN
  Filled 2024-03-08: qty 15, 4d supply, fill #0

## 2024-03-08 MED ORDER — VITAMIN B-1 100 MG PO TABS: 100.0000 mg | ORAL_TABLET | Freq: Every day | ORAL | Status: DC

## 2024-03-08 MED ORDER — CHLORDIAZEPOXIDE HCL 10 MG PO CAPS
ORAL_CAPSULE | ORAL | 0 refills | Status: DC
  Filled 2024-03-08: qty 30, 12d supply, fill #0

## 2024-03-08 NOTE — Discharge Summary (Signed)
 Physician Discharge Summary  Gary Mclaughlin FMW:[id number] DOB: Jul 30, 1970 DOA: 03/04/2024  PCP: Pcp, No  Admit date: 03/04/2024 Discharge date: 03/08/2024   Recommendations for Outpatient Follow-up:  Follow up with PCP in 1-2 weeks Please obtain BMP/CBC in one week Please do counseling about alcohol  abuse   Diet recommendation: Regular diet  Brief/Interim Summary: Gary Mclaughlin is a 53 y.o. male with medical history significant of recent admission for rib fractures, alcohol  abuse, alcohol -related withdrawal who presented to the emergency department with pain. He Was discharged on 10/24.  He walked out of the back of a box truck and fell.  He was found to have multiple rib fractures and required a chest tube as well as supportive care with no pneumothorax.  He has severe alcohol  abuse and was discharged on phenobarbital  taper.  In ED CT chest was obtained which showed cavitary airspace opacity in right lower lobe on changed and thought to be a contusion.  There is also a small effusion.  Patient was found to be hypoxic in the 80s.  He was admitted for further workup.  On admission he had pulled out his IV and had intermittent episodes of hypotension.  He was completely alert and oriented and denied any other complaints.  He was admitted for pain control, and was started on alcohol  withdrawal protocol as well, pain was controlled and continues to use incentive spirometer, with good results, no further oxygen requirement.       Acute hypoxic respiratory failure most likely secondary to atelectasis in setting of rib fractures Multiple rib fractures -Again he was encouraged to use incentive spirometer, wean oxygen as tolerated, he is currently on room air for last day ambulating with no hypoxia or dyspnea -This is secondary to rib fracture and poor inspiratory effort as imaging with significant atelectasis Treated with scheduled Tylenol , and scheduled IV Toradol  for pain control, as well as  Robaxin , and with oxycodone , he is currently off scheduled Tylenol  and Toradol .  Last 24 hours, pain is controlled on current regimen . - Encouraged use incentive spirometry      Severe alcohol  abuse with early DTs -He is intoxicated on arrival - Had some confusion and tremors , high risk for withdrawals given alcohol  abuse, so was started on CIWA protocol, he will be discharged on Librium  taper   Hyponatremia - Improving with IV fluids   Thrombocytosis - Much increased from recent admission, was 396K on discharge, it is 897K currently, - Volume depletion, dehydration  Discharge Diagnoses:  Principal Problem:   Hypoxic respiratory failure (HCC) Active Problems:   Rib fractures    Discharge Instructions  Discharge Instructions     Diet - low sodium heart healthy   Complete by: As directed    Discharge instructions   Complete by: As directed    Follow with Primary MD Pcp,    Get CBC, CMP    Disposition Home    Diet: Heart Healthy   On your next visit with your primary care physician please Get Medicines reviewed and adjusted.   Please request your Prim.MD to go over all Hospital Tests and Procedure/Radiological results at the follow up, please get all Hospital records sent to your Prim MD by signing hospital release before you go home.   If you experience worsening of your admission symptoms, develop shortness of breath, life threatening emergency, suicidal or homicidal thoughts you must seek medical attention immediately by calling 911 or calling your MD immediately  if symptoms less severe.  You Must  read complete instructions/literature along with all the possible adverse reactions/side effects for all the Medicines you take and that have been prescribed to you. Take any new Medicines after you have completely understood and accpet all the possible adverse reactions/side effects.   Do not drive, operating heavy machinery, perform activities at heights, swimming  or participation in water activities or provide baby sitting services if your were admitted for syncope or siezures until you have seen by Primary MD or a Neurologist and advised to do so again.  Do not drive when taking Pain medications.    Do not take more than prescribed Pain, Sleep and Anxiety Medications  Special Instructions: If you have smoked or chewed Tobacco  in the last 2 yrs please stop smoking, stop any regular Alcohol   and or any Recreational drug use.  Wear Seat belts while driving.   Please note  You were cared for by a hospitalist during your hospital stay. If you have any questions about your discharge medications or the care you received while you were in the hospital after you are discharged, you can call the unit and asked to speak with the hospitalist on call if the hospitalist that took care of you is not available. Once you are discharged, your primary care physician will handle any further medical issues. Please note that NO REFILLS for any discharge medications will be authorized once you are discharged, as it is imperative that you return to your primary care physician (or establish a relationship with a primary care physician if you do not have one) for your aftercare needs so that they can reassess your need for medications and monitor your lab values.   Increase activity slowly   Complete by: As directed    No wound care   Complete by: As directed       Allergies as of 03/08/2024   No Known Allergies      Medication List     STOP taking these medications    PHENobarbital  32.4 MG tablet Commonly known as: LUMINAL       TAKE these medications    albuterol  108 (90 Base) MCG/ACT inhaler Commonly known as: VENTOLIN  HFA Inhale 2 puffs into the lungs every 6 (six) hours as needed for wheezing or shortness of breath.   cetirizine  10 MG tablet Commonly known as: ZyrTEC  Allergy Take 1 tablet (10 mg total) by mouth daily.   chlordiazePOXIDE  10 MG  capsule Commonly known as: LIBRIUM  Please take by mouth: 1 capsule (10mg  total) 4 times daily x 3 days, then 1 capsule (10mg  total) 3 times daily x 3 days, then 1 capsule (10mg  total) 2 times daily x 3 days, then 1 capsule (10mg  total) once daily for 3 days then stop.   escitalopram 10 MG tablet Commonly known as: LEXAPRO Take 1 tablet (10 mg total) by mouth daily.   folic acid  1 MG tablet Commonly known as: FOLVITE  Take 1 tablet (1 mg total) by mouth daily. Start taking on: March 09, 2024   gabapentin  300 MG capsule Commonly known as: NEURONTIN  Take 1 capsule (300 mg total) by mouth 3 (three) times daily. What changed: when to take this   guaiFENesin  600 MG 12 hr tablet Commonly known as: MUCINEX  Take 1 tablet (600 mg total) by mouth 2 (two) times daily as needed for to loosen phlegm. What changed: when to take this   hydrOXYzine  25 MG tablet Commonly known as: ATARAX  Take 1 tablet (25 mg total) by mouth 3 (three)  times daily as needed for anxiety. What changed: when to take this   lidocaine  5 % Commonly known as: LIDODERM  Place 1 patch onto the skin daily. Remove & Discard patch within 12 hours or as directed by MD   methocarbamol  500 MG tablet Commonly known as: ROBAXIN  Take 2 tablets (1,000 mg total) by mouth every 8 (eight) hours as needed for muscle spasms. What changed: when to take this   nicotine  14 mg/24hr patch Commonly known as: NICODERM CQ  - dosed in mg/24 hours Place 1 patch (14 mg total) onto the skin daily.   oxyCODONE  5 MG immediate release tablet Commonly known as: Oxy IR/ROXICODONE  Take 1 tablet (5 mg total) by mouth every 6 (six) hours as needed for moderate pain (pain score 4-6). What changed: when to take this   Symbicort 160-4.5 MCG/ACT inhaler Generic drug: budesonide-formoterol  Inhale 2 puffs into the lungs 2 (two) times daily.   thiamine  100 MG tablet Commonly known as: Vitamin B-1 Take 1 tablet (100 mg total) by mouth daily. Start  taking on: March 09, 2024        Follow-up Information     [place], Arnaz, FNP Follow up in 1 week(s).   Specialty: Nurse Practitioner Contact information: [address] [place] [address] [phone]                No Known Allergies  Consultations: none   Procedures/Studies: CT Chest Wo Contrast Result Date: 03/04/2024 EXAM: CT CHEST WITHOUT CONTRAST 03/04/2024 08:12:50 PM TECHNIQUE: CT of the chest was performed without the administration of intravenous contrast. Multiplanar reformatted images are provided for review. Automated exposure control, iterative reconstruction, and/or weight based adjustment of the mA/kV was utilized to reduce the radiation dose to as low as reasonably achievable. COMPARISON: Comparison to chest x-ray today and CT on 02/22/2024. CLINICAL HISTORY: Better eval atelectasis vs developing pneumonia. FINDINGS: MEDIASTINUM: Scattered aortic atherosclerosis. LYMPH NODES: No mediastinal, hilar or axillary lymphadenopathy. LUNGS AND PLEURA: There is a small right pleural effusion. A rounded airspace opacity with central cavitation in the superior segment of the right lower lobe is unchanged from the prior study and likely reflects contusion, although atypical infection cannot be completely excluded. Ground glass and linear opacities peripherally in the right lower lobe favor atelectasis. No pneumothorax. SOFT TISSUES/BONES: Posterolateral right 7th and 8th rib fractures are noted. UPPER ABDOMEN: Limited images of the upper abdomen demonstrate no acute abnormality. IMPRESSION: 1. Rounded cavitary airspace opacity in the superior segment of the right lower lobe, unchanged and favored contusion; atypical infection remains a consideration. 2. Small right pleural effusion. 3. Posterolateral right 7th and 8th rib fractures. 4. Peripheral right lower lobe ground glass and linear opacities, favoring atelectasis. Electronically signed by: Franky Crease MD  03/04/2024 08:22 PM EDT RP Workstation: [id number]   DG Chest Portable 1 View Result Date: 03/04/2024 CLINICAL DATA:  History of right pneumothorax, prior abnormal chest x-ray EXAM: PORTABLE CHEST 1 VIEW COMPARISON:  02/25/2024 FINDINGS: 2 frontal views of the chest demonstrate an unremarkable cardiac silhouette. There is patchy bibasilar consolidation, right greater than left, which could reflect atelectasis or airspace disease. No effusion or pneumothorax. No acute bony abnormalities. IMPRESSION: 1. No evidence of residual or recurrent pneumothorax. 2. Patchy bibasilar consolidation, which may reflect hypoventilatory changes or developing airspace disease. Electronically Signed   By: Ozell Daring M.D.   On: 03/04/2024 19:18   DG CHEST PORT 1 VIEW Result Date: 02/25/2024 CLINICAL DATA:  Follow-up right pneumothorax following chest  tube removal. EXAM: PORTABLE CHEST 1 VIEW COMPARISON:  Earlier today. FINDINGS: The right basilar pleural catheter has been removed. Minimal linear atelectasis in that region and small amount of subcutaneous air. The remainder of the lungs are clear. No pneumothorax. Normal sized heart. Unremarkable bones. IMPRESSION: 1. No pneumothorax following right chest tube removal. 2. Minimal right basilar atelectasis. Electronically Signed   By: Elspeth Bathe M.D.   On: 02/25/2024 10:35   DG CHEST PORT 1 VIEW Result Date: 02/25/2024 CLINICAL DATA:  Follow-up right pneumothorax. EXAM: PORTABLE CHEST 1 VIEW COMPARISON:  02/24/2024 FINDINGS: Stable pleural catheter at the right lateral lung base. No pneumothorax seen. Normal-sized heart. Clear lungs with normal vascularity. Unremarkable bones. IMPRESSION: 1. No pneumothorax. 2. No acute abnormality. Electronically Signed   By: Elspeth Bathe M.D.   On: 02/25/2024 10:34   DG CHEST PORT 1 VIEW Result Date: 02/24/2024 CLINICAL DATA:  Right-sided chest tube.  Evaluate pneumothorax. EXAM: PORTABLE CHEST 1 VIEW COMPARISON:  02/23/2024.  FINDINGS: Right lateral basilar chest tube unchanged. No right-sided pneumothorax visualized. Lungs are otherwise clear. Cardiomediastinal silhouette is normal. Evidence of patient's right posterior rib fractures. IMPRESSION: 1. No acute cardiopulmonary disease. 2. Right lateral basilar chest tube unchanged. No pneumothorax visualized. Electronically Signed   By: Toribio Agreste M.D.   On: 02/24/2024 07:54   DG Chest Port 1 View Result Date: 02/23/2024 EXAM: 1 VIEW(S) XRAY OF THE CHEST 02/23/2024 04:43:00 AM COMPARISON: 02/22/2024 CLINICAL HISTORY: Pneumothorax FINDINGS: LINES, TUBES AND DEVICES: Stable right chest tube in place. LUNGS AND PLEURA: No focal pulmonary opacity. No pulmonary edema. Decreased right pleural effusion. Slightly decreased right basilar pneumothorax. HEART AND MEDIASTINUM: No acute abnormality of the cardiac and mediastinal silhouettes. BONES AND SOFT TISSUES: Right rib fractures again noted. Small right chest wall soft tissue emphysema. IMPRESSION: 1. Decreased right pleural effusion. 2. No radiographic findings of residual pneumothorax. Electronically signed by: Waddell Calk MD 02/23/2024 05:33 AM EDT RP Workstation: [id number]   DG Chest Portable 1 View Result Date: 02/22/2024 EXAM: 1 VIEW XRAY OF THE CHEST 02/22/2024 08:01:00 PM COMPARISON: Chest x-ray 02/27/2019, CT chest 02/27/2019. CLINICAL HISTORY: S/P chest tube. S/P chest tube placement. FINDINGS: LINES, TUBES AND DEVICES: Interval placement of the right chest tube with pigtail overlying the inferior lateral most aspect of the right lower lung zone. LUNGS AND PLEURA: Interval resolution of right pneumothorax. Persistent trace right pleural effusion. No left pneumothorax or pleural effusion. No focal pulmonary opacity. No pulmonary edema. HEART AND MEDIASTINUM: No acute abnormality of the cardiac and mediastinal silhouettes. BONES AND SOFT TISSUES: Redemonstration of acute displaced posterior seventh and eight rib fracture.  Right chest wall for any soft tissue emphysema. IMPRESSION: 1. Interval resolution of right pneumothorax status post right chest tube placement. 2. Trace right pleural effusion on the right. 3. Acute displaced fractures of the right posterior seventh and eighth ribs. Electronically signed by: Morgane Naveau MD 02/22/2024 08:20 PM EDT RP Workstation: [id number]   CT CHEST ABDOMEN PELVIS W CONTRAST Result Date: 02/22/2024 CLINICAL DATA:  [place] off tractor yesterday, blunt trauma EXAM: CT CHEST, ABDOMEN, AND PELVIS WITH CONTRAST TECHNIQUE: Multidetector CT imaging of the chest, abdomen and pelvis was performed following the standard protocol during bolus administration of intravenous contrast. RADIATION DOSE REDUCTION: This exam was performed according to the departmental dose-optimization program which includes automated exposure control, adjustment of the mA and/or kV according to patient size and/or use of iterative reconstruction technique. CONTRAST:  75mL OMNIPAQUE  IOHEXOL  350 MG/ML SOLN COMPARISON:  02/22/2024, 09/14/2022  FINDINGS: CT CHEST FINDINGS Cardiovascular: The heart is unremarkable without pericardial effusion. No evidence of acute vascular injury. Atherosclerosis of the aorta. Mediastinum/Nodes: There is focal short segment wall thickening of the distal thoracic esophagus, reference image 44/3. Further evaluation with nonemergent esophagram or endoscopy may be useful. Small hiatal hernia. Thyroid  and trachea are unremarkable.  No pathologic adenopathy. Lungs/Pleura: There is a right-sided pigtail pleural drainage catheter within the anterior right lower chest, with near complete resolution of the right pneumothorax seen previously. Trace residual pneumothorax remains at the right apex. A rounded area of consolidation and gas lucency within the superior segment right lower lobe measuring 3.9 x 3.2 cm reference image 25/3 likely reflects pulmonary contusion and laceration, given overlying rib  fractures. The left chest is clear.  Central airways are patent. Musculoskeletal: There are acute minimally displaced right posterolateral seventh and eighth rib fractures. Subcutaneous gas within the right posterolateral chest wall consistent with known pneumothorax and right chest tube placement. There are no other acute or destructive bony abnormalities. Reconstructed images demonstrate no additional findings. CT ABDOMEN PELVIS FINDINGS Hepatobiliary: No hepatic injury or perihepatic hematoma. Gallbladder is unremarkable. Pancreas: Unremarkable. No pancreatic ductal dilatation or surrounding inflammatory changes. Spleen: No splenic injury or perisplenic hematoma. Adrenals/Urinary Tract: No adrenal hemorrhage or renal injury identified. Bladder is unremarkable. Stomach/Bowel: No bowel obstruction or ileus. Normal retrocecal appendix. There is a small hiatal hernia, with nonspecific wall thickening of the gastric cardia and gastroesophageal junction. Vascular/Lymphatic: Aortic atherosclerosis. No enlarged abdominal or pelvic lymph nodes. Reproductive: Prostate is unremarkable. Other: No free fluid or free intraperitoneal gas. No abdominal wall hernia. Musculoskeletal: There are no acute displaced fractures. Prior healed fractures of the left superior and inferior pubic rami and left sacral ala are noted. Cannulated screws traverse the bilateral sacroiliac joints and left superior pubic ramus consistent with prior fracture repair. Stable chronic L1 compression deformity. Reconstructed images demonstrate no additional findings. IMPRESSION: 1. Near complete resolution of the right pneumothorax after interval chest tube placement. Trace residual right apical pneumothorax without tension effect. 2. Rounded area of consolidation in internal gas lucency within the superior segment right lower lobe, favor contusion and laceration given overlying rib fractures. 3. Acute minimally displaced right posterolateral seventh and  eighth rib fractures. 4. Short segment circumferential mural thickening of the distal thoracic esophagus, as well as mural thickening of the gastric cardia and gastroesophageal junction. Further evaluation with nonemergent esophagram or endoscopy is recommended to assess for underlying mass. 5.  Aortic Atherosclerosis (ICD10-I70.0). Electronically Signed   By: Ozell Daring M.D.   On: 02/22/2024 19:32   CT Head Wo Contrast Result Date: 02/22/2024 EXAM: CT HEAD AND CERVICAL SPINE 02/22/2024 07:17:56 PM TECHNIQUE: CT of the head and cervical spine was performed without the administration of intravenous contrast. Multiplanar reformatted images are provided for review. Automated exposure control, iterative reconstruction, and/or weight based adjustment of the mA/kV was utilized to reduce the radiation dose to as low as reasonably achievable. COMPARISON: CT head and c-spine 07/02/2023. CLINICAL HISTORY: Blunt trauma. FINDINGS: CT HEAD BRAIN AND VENTRICLES: No acute intracranial hemorrhage. No mass effect or midline shift. No abnormal extra-axial fluid collection. No evidence of acute infarct. No hydrocephalus. Atherosclerotic calcifications are present within the cavernous internal carotid arteries. Left inferior frontal lobe encephalomalacia. ORBITS: No acute abnormality. SINUSES AND MASTOIDS:N No acute abnormality. SOFT TISSUES AND SKULL: No acute skull fracture. CT CERVICAL SPINE BONES AND ALIGNMENT: No acute fracture or traumatic malalignment. DEGENERATIVE CHANGES: Multilevel moderate degenerative changes of the  spine. No associated severe osseous neural foraminal or central canal stenosis. Moderate osseous neural foraminal stenosis noted at the right C5-C6 level. SOFT TISSUES: No prevertebral soft tissue swelling. Superior soft tissue emphysema along the right back soft tissues noted. INCIDENTAL CHEST FINDINGS: At least small right apical pneumothorax - please see separately dictated CT chest 1025. Right  azygous fissure noted. IMPRESSION: 1. At least small volume right apical pneumothorax and superior soft tissue emphysema along the right back soft tissues, correlate with separately dictated chest CT findings. 2. No acute intracranial abnormality. 3. No acute cervical spine fracture or traumatic malalignment. Electronically signed by: Morgane Naveau MD 02/22/2024 07:29 PM EDT RP Workstation: [id number]   CT Cervical Spine Wo Contrast Result Date: 02/22/2024 EXAM: CT HEAD AND CERVICAL SPINE 02/22/2024 07:17:56 PM TECHNIQUE: CT of the head and cervical spine was performed without the administration of intravenous contrast. Multiplanar reformatted images are provided for review. Automated exposure control, iterative reconstruction, and/or weight based adjustment of the mA/kV was utilized to reduce the radiation dose to as low as reasonably achievable. COMPARISON: CT head and c-spine 07/02/2023. CLINICAL HISTORY: Blunt trauma. FINDINGS: CT HEAD BRAIN AND VENTRICLES: No acute intracranial hemorrhage. No mass effect or midline shift. No abnormal extra-axial fluid collection. No evidence of acute infarct. No hydrocephalus. Atherosclerotic calcifications are present within the cavernous internal carotid arteries. Left inferior frontal lobe encephalomalacia. ORBITS: No acute abnormality. SINUSES AND MASTOIDS:N No acute abnormality. SOFT TISSUES AND SKULL: No acute skull fracture. CT CERVICAL SPINE BONES AND ALIGNMENT: No acute fracture or traumatic malalignment. DEGENERATIVE CHANGES: Multilevel moderate degenerative changes of the spine. No associated severe osseous neural foraminal or central canal stenosis. Moderate osseous neural foraminal stenosis noted at the right C5-C6 level. SOFT TISSUES: No prevertebral soft tissue swelling. Superior soft tissue emphysema along the right back soft tissues noted. INCIDENTAL CHEST FINDINGS: At least small right apical pneumothorax - please see separately dictated CT chest 1025.  Right azygous fissure noted. IMPRESSION: 1. At least small volume right apical pneumothorax and superior soft tissue emphysema along the right back soft tissues, correlate with separately dictated chest CT findings. 2. No acute intracranial abnormality. 3. No acute cervical spine fracture or traumatic malalignment. Electronically signed by: Morgane Naveau MD 02/22/2024 07:29 PM EDT RP Workstation: [id number]   DG Chest Port 1 View Result Date: 02/22/2024 CLINICAL DATA:  [id number] Chest pain [id number] EXAM: PORTABLE CHEST - 1 VIEW COMPARISON:  10/06/2023 FINDINGS: Coarsening of the pulmonary interstitium with relative flattening of the diaphragms, suggesting emphysema. Moderate volume sub pulmonic pneumothorax with a small amount of subcutaneous emphysema along the right chest wall. Rounded opacity overlying the medial right lung base measuring 2.8 cm. No pleural effusion. No cardiomegaly.Mildly displaced, 6 and seventh rib fractures. Multilevel thoracic osteophytosis. IMPRESSION: 1. Moderate volume subpulmonic right pneumothorax with a small amount of subcutaneous emphysema. No mediastinal shift to suggest tension pathology. 2. Rounded opacity overlying the medial right lung base, measuring 2.8 cm, which is indeterminate, and could represent an external structure to the patient. Correlation with physical exam findings recommended. Nonemergent chest CT with IV contrast should also be considered. 3. Mildly displaced, posterior right 6 and 7 rib fractures. Critical Value/emergent results were called by telephone at the time of interpretation on 02/22/2024 at 6:27 pm to provider LAVONIA PAT MD, who verbally acknowledged these results. Electronically Signed   By: Rogelia Myers M.D.   On: 02/22/2024 18:30   (Echo, Carotid, EGD, Colonoscopy, ERCP)    Subjective: Ports pain is controlled today,  and denies any dyspnea  Discharge Exam: Vitals:   03/08/24 0438 03/08/24 0757  BP: (!) 142/93 (!) 138/98  Pulse: 62 79   Resp: 19 16  Temp: 98.2 F (36.8 C)   SpO2:  92%   Vitals:   03/07/24 2130 03/08/24 0038 03/08/24 0438 03/08/24 0757  BP: (!) 162/91 (!) 149/95 (!) 142/93 (!) 138/98  Pulse:  75 62 79  Resp:  18 19 16   Temp:  98.2 F (36.8 C) 98.2 F (36.8 C)   TempSrc:  Oral Oral   SpO2:    92%  Weight:      Height:        General: Pt is alert, awake, not in acute distress Cardiovascular: RRR, S1/S2 +, no rubs, no gallops Respiratory: CTA bilaterally, no wheezing, no rhonchi Abdominal: Soft, NT, ND, bowel sounds + Extremities: no edema, no cyanosis    The results of significant diagnostics from this hospitalization (including imaging, microbiology, ancillary and laboratory) are listed below for reference.     Microbiology: No results found for this or any previous visit (from the past 240 hours).   Labs: BNP (last 3 results) No results for input(s): BNP in the last 8760 hours. Basic Metabolic Panel: Recent Labs  Lab 03/04/24 2048 03/05/24 0500 03/07/24 0506 03/08/24 0403 03/08/24 0818  NA 134* 134* 131* 136  --   K 4.2 4.4 4.0 5.6* 4.4  CL 93* 96* 94* 99  --   CO2 27 25 26 28   --   GLUCOSE 91 73 91 91  --   BUN 10 11 16 19   --   CREATININE 0.71 0.71 0.69 0.92  --   CALCIUM 9.4 9.0 10.0 10.1  --   MG  --   --  1.7 1.9  --   PHOS  --   --  5.2* 4.8*  --    Liver Function Tests: Recent Labs  Lab 03/04/24 2048  AST 40  ALT 27  ALKPHOS 101  BILITOT <0.2  PROT 6.9  ALBUMIN 3.2*   No results for input(s): LIPASE, AMYLASE in the last 168 hours. No results for input(s): AMMONIA in the last 168 hours. CBC: Recent Labs  Lab 03/04/24 2048 03/05/24 0500 03/07/24 0506 03/08/24 0403  WBC 8.1 5.4 7.6 8.9  NEUTROABS 5.1  --   --   --   HGB 11.0* 10.7* 10.9* 11.6*  HCT 33.2* 32.5* 32.8* 35.4*  MCV 90.2 91.3 90.1 92.7  PLT 854* 897* 852* 823*   Cardiac Enzymes: No results for input(s): CKTOTAL, CKMB, CKMBINDEX, TROPONINI in the last 168  hours. BNP: Invalid input(s): POCBNP CBG: No results for input(s): GLUCAP in the last 168 hours. D-Dimer No results for input(s): DDIMER in the last 72 hours. Hgb A1c No results for input(s): HGBA1C in the last 72 hours. Lipid Profile No results for input(s): CHOL, HDL, LDLCALC, TRIG, CHOLHDL, LDLDIRECT in the last 72 hours. Thyroid  function studies No results for input(s): TSH, T4TOTAL, T3FREE, THYROIDAB in the last 72 hours.  Invalid input(s): FREET3 Anemia work up No results for input(s): VITAMINB12, FOLATE, FERRITIN, TIBC, IRON, RETICCTPCT in the last 72 hours. Urinalysis    Component Value Date/Time   COLORURINE YELLOW 08/06/2023 0332   APPEARANCEUR HAZY (A) 08/06/2023 0332   LABSPEC 1.011 08/06/2023 0332   PHURINE 5.0 08/06/2023 0332   GLUCOSEU NEGATIVE 08/06/2023 0332   HGBUR SMALL (A) 08/06/2023 0332   BILIRUBINUR NEGATIVE 08/06/2023 0332   KETONESUR NEGATIVE 08/06/2023 0332   PROTEINUR 100 (  A) 08/06/2023 0332   UROBILINOGEN 0.2 10/22/2011 1535   NITRITE NEGATIVE 08/06/2023 0332   LEUKOCYTESUR NEGATIVE 08/06/2023 0332   Sepsis Labs Recent Labs  Lab 03/04/24 2048 03/05/24 0500 03/07/24 0506 03/08/24 0403  WBC 8.1 5.4 7.6 8.9   Microbiology No results found for this or any previous visit (from the past 240 hours).   Time coordinating discharge: Over 30 minutes  SIGNED:   Brayton Lye, MD  [facility] 03/08/2024, 10:09 AM Pager   If 7PM-7AM, please contact night-coverage [email or web address] Password [email or web address]

## 2024-03-08 NOTE — Plan of Care (Signed)

## 2024-03-24 ENCOUNTER — Emergency Department (HOSPITAL_COMMUNITY): Payer: MEDICAID

## 2024-03-24 ENCOUNTER — Other Ambulatory Visit: Payer: Self-pay

## 2024-03-24 ENCOUNTER — Emergency Department (HOSPITAL_COMMUNITY): Admission: EM | Admit: 2024-03-24 | Discharge: 2024-03-24 | Discharge status: Against Medical Advice | Payer: MEDICAID

## 2024-03-24 DIAGNOSIS — Z5329 Procedure and treatment not carried out because of patient's decision for other reasons: Secondary | ICD-10-CM | POA: Insufficient documentation

## 2024-03-24 DIAGNOSIS — R0781 Pleurodynia: Secondary | ICD-10-CM | POA: Insufficient documentation

## 2024-03-24 DIAGNOSIS — Y908 Blood alcohol level of 240 mg/100 ml or more: Secondary | ICD-10-CM | POA: Insufficient documentation

## 2024-03-24 DIAGNOSIS — Z5900 Homelessness unspecified: Secondary | ICD-10-CM | POA: Diagnosis not present

## 2024-03-24 DIAGNOSIS — F1010 Alcohol abuse, uncomplicated: Secondary | ICD-10-CM | POA: Diagnosis not present

## 2024-03-24 DIAGNOSIS — R0789 Other chest pain: Secondary | ICD-10-CM

## 2024-03-24 LAB — COMPREHENSIVE METABOLIC PANEL WITH GFR
ALT: 33 U/L (ref 0–44)
AST: 58 U/L — ABNORMAL HIGH (ref 15–41)
Albumin: 3.8 g/dL (ref 3.5–5.0)
Alkaline Phosphatase: 108 U/L (ref 38–126)
Anion gap: 12 (ref 5–15)
BUN: 13 mg/dL (ref 6–20)
CO2: 23 mmol/L (ref 22–32)
Calcium: 9.3 mg/dL (ref 8.9–10.3)
Chloride: 91 mmol/L — ABNORMAL LOW (ref 98–111)
Creatinine, Ser: 0.62 mg/dL (ref 0.61–1.24)
GFR, Estimated: >60 mL/min (ref >60)
Glucose, Bld: 92 mg/dL (ref 70–99)
Potassium: 4 mmol/L (ref 3.5–5.1)
Sodium: 126 mmol/L — ABNORMAL LOW (ref 135–145)
Total Bilirubin: 0.4 mg/dL (ref 0.0–1.2)
Total Protein: 7.3 g/dL (ref 6.5–8.1)

## 2024-03-24 LAB — CBC
HCT: 34.2 % — ABNORMAL LOW (ref 39.0–52.0)
Hemoglobin: 11.7 g/dL — ABNORMAL LOW (ref 13.0–17.0)
MCH: 31 pg (ref 26.0–34.0)
MCHC: 34.2 g/dL (ref 30.0–36.0)
MCV: 90.7 fL (ref 80.0–100.0)
Platelets: 246 K/uL (ref 150–400)
RBC: 3.77 MIL/uL — ABNORMAL LOW (ref 4.22–5.81)
RDW: 16.2 % — ABNORMAL HIGH (ref 11.5–15.5)
WBC: 7.6 K/uL (ref 4.0–10.5)
nRBC: 0 % (ref 0.0–0.2)

## 2024-03-24 LAB — ETHANOL: Alcohol, Ethyl (B): 433 mg/dL (ref <15)

## 2024-03-24 MED ORDER — SODIUM CHLORIDE 0.9 % IV BOLUS
1000.0000 mL | Freq: Once | INTRAVENOUS | Status: AC
  Administered 2024-03-24: 1000 mL via INTRAVENOUS

## 2024-03-24 MED ORDER — ACETAMINOPHEN 500 MG PO TABS
1000.0000 mg | ORAL_TABLET | Freq: Once | ORAL | Status: AC
  Administered 2024-03-24: 1000 mg via ORAL
  Filled 2024-03-24: qty 2

## 2024-03-24 MED ORDER — METHOCARBAMOL 500 MG PO TABS
1000.0000 mg | ORAL_TABLET | Freq: Once | ORAL | Status: AC
  Administered 2024-03-24: 1000 mg via ORAL
  Filled 2024-03-24: qty 2

## 2024-03-24 NOTE — ED Provider Notes (Signed)
 [place] EMERGENCY DEPARTMENT AT [facility] Provider Note   CSN: [id number] Arrival date & time: 03/24/24  8086     Patient presents with: Alcohol  Problem and Suicidal   Gary Mclaughlin is a 53 y.o. male.   54 year old male presents for evaluation of rib pain.  States he has had rib pain for a while since the fall.  Does have a history of alcoholism and states he did last drink earlier today.SABRA Colace to triage staff that he was suicidal and had wanted medical detox for alcohol .  I specifically asked about this and he denied wanting any of these things.  He states he is not suicidal he is just having rib pain.  He is not interested in medical detox at this time on my history taking.  Denies any other recent falls.  Denies any other concerns at this time.   Alcohol  Problem Pertinent negatives include no chest pain, no abdominal pain and no shortness of breath.       Prior to Admission medications   Medication Sig Start Date End Date Taking? Authorizing Provider  albuterol  (VENTOLIN  HFA) 108 (90 Base) MCG/ACT inhaler Inhale 2 puffs into the lungs every 6 (six) hours as needed for wheezing or shortness of breath. 02/26/24   Vicci Burnard SAUNDERS, PA-C  budesonide -formoterol  (SYMBICORT ) 160-4.5 MCG/ACT inhaler Inhale 2 puffs into the lungs 2 (two) times daily. 02/26/24   Vicci Burnard SAUNDERS, PA-C  cetirizine  (ZYRTEC  ALLERGY) 10 MG tablet Take 1 tablet (10 mg total) by mouth daily. 02/26/24   Vicci Burnard SAUNDERS, PA-C  chlordiazePOXIDE  (LIBRIUM ) 10 MG capsule Please take by mouth: 1 capsule (10mg  total) 4 times daily x 3 days, then 1 capsule (10mg  total) 3 times daily x 3 days, then 1 capsule (10mg  total) 2 times daily x 3 days, then 1 capsule (10mg  total) once daily for 3 days then stop. 03/08/24   Elgergawy, Brayton RAMAN, MD  escitalopram  (LEXAPRO ) 10 MG tablet Take 1 tablet (10 mg total) by mouth daily. 02/26/24   Vicci Burnard SAUNDERS, PA-C  folic acid  (FOLVITE ) 1 MG tablet Take 1 tablet (1 mg  total) by mouth daily. 03/09/24   Elgergawy, Brayton RAMAN, MD  gabapentin  (NEURONTIN ) 300 MG capsule Take 1 capsule (300 mg total) by mouth 3 (three) times daily. Patient taking differently: Take 300 mg by mouth daily. 02/26/24   Vicci Burnard SAUNDERS, PA-C  guaiFENesin  (MUCINEX ) 600 MG 12 hr tablet Take 1 tablet (600 mg total) by mouth 2 (two) times daily as needed for to loosen phlegm. Patient taking differently: Take 600 mg by mouth daily. 02/26/24   Vicci Burnard SAUNDERS, PA-C  hydrOXYzine  (ATARAX ) 25 MG tablet Take 1 tablet (25 mg total) by mouth 3 (three) times daily as needed for anxiety. Patient taking differently: Take 25 mg by mouth daily. 02/26/24   Vicci Burnard SAUNDERS, PA-C  lidocaine  (LIDODERM ) 5 % Place 1 patch onto the skin daily. Remove & Discard patch within 12 hours or as directed by MD Patient not taking: Reported on 03/05/2024 02/27/24   Vicci Burnard SAUNDERS, PA-C  methocarbamol  (ROBAXIN ) 500 MG tablet Take 2 tablets (1,000 mg total) by mouth every 8 (eight) hours as needed for muscle spasms. Patient taking differently: Take 1,000 mg by mouth daily. 02/26/24   Vicci Burnard SAUNDERS, PA-C  nicotine  (NICODERM CQ  - DOSED IN MG/24 HOURS) 14 mg/24hr patch Place 1 patch (14 mg total) onto the skin daily. Patient not taking: Reported on 03/05/2024 02/27/24   Vicci Burnard  R, PA-C  oxyCODONE  (OXY IR/ROXICODONE ) 5 MG immediate release tablet Take 1 tablet (5 mg total) by mouth every 6 (six) hours as needed for moderate pain (pain score 4-6). 03/08/24   Elgergawy, Brayton RAMAN, MD  thiamine  (VITAMIN B-1) 100 MG tablet Take 1 tablet (100 mg total) by mouth daily. 03/09/24   Elgergawy, Brayton RAMAN, MD    Allergies: Patient has no known allergies.    Review of Systems  Constitutional:  Negative for chills and fever.  HENT:  Negative for ear pain and sore throat.   Eyes:  Negative for pain and visual disturbance.  Respiratory:  Negative for cough and shortness of breath.   Cardiovascular:  Negative for chest pain and  palpitations.  Gastrointestinal:  Negative for abdominal pain and vomiting.  Genitourinary:  Negative for dysuria and hematuria.  Musculoskeletal:  Negative for arthralgias and back pain.  Skin:  Negative for color change and rash.  Neurological:  Negative for seizures and syncope.  All other systems reviewed and are negative.   Updated Vital Signs BP (!) 122/106   Pulse 85   Temp 97.6 F (36.4 C) (Oral)   Resp 20   Ht 5' 8 (1.727 m)   Wt 68.9 kg   SpO2 95%   BMI 23.11 kg/m   Physical Exam Vitals and nursing note reviewed.  Constitutional:      General: He is not in acute distress.    Appearance: Normal appearance. He is well-developed. He is not ill-appearing.     Comments: Disheveled appearing   HENT:     Head: Normocephalic and atraumatic.  Eyes:     Conjunctiva/sclera: Conjunctivae normal.  Cardiovascular:     Rate and Rhythm: Normal rate and regular rhythm.     Heart sounds: No murmur heard. Pulmonary:     Effort: Pulmonary effort is normal. No respiratory distress.     Breath sounds: Normal breath sounds.  Abdominal:     Palpations: Abdomen is soft.     Tenderness: There is no abdominal tenderness.  Musculoskeletal:        General: No swelling.     Cervical back: Neck supple.  Skin:    General: Skin is warm and dry.     Capillary Refill: Capillary refill takes less than 2 seconds.  Neurological:     General: No focal deficit present.     Mental Status: He is alert.  Psychiatric:        Mood and Affect: Mood normal.     (all labs ordered are listed, but only abnormal results are displayed) Labs Reviewed  COMPREHENSIVE METABOLIC PANEL WITH GFR - Abnormal; Notable for the following components:      Result Value   Sodium 126 (*)    Chloride 91 (*)    AST 58 (*)    All other components within normal limits  ETHANOL - Abnormal; Notable for the following components:   Alcohol , Ethyl (B) 433 (*)    All other components within normal limits  CBC -  Abnormal; Notable for the following components:   RBC 3.77 (*)    Hemoglobin 11.7 (*)    HCT 34.2 (*)    RDW 16.2 (*)    All other components within normal limits  RAPID URINE DRUG SCREEN, HOSP PERFORMED    EKG: None  Radiology: DG Ribs Unilateral Left Result Date: 03/24/2024 EXAM: 1 VIEW(S) XRAY OF THE LEFT RIBS 03/24/2024 08:56:00 PM COMPARISON: CT chest 03/04/2024. CLINICAL HISTORY: Rib pain. FINDINGS: BONES: Healed  left posterior eighth rib fracture. No acute fracture. LUNGS AND PLEURA: Visualized lungs demonstrate no acute abnormality. IMPRESSION: 1. No acute rib fracture. Electronically signed by: Morgane Naveau MD 03/24/2024 09:05 PM EST RP Workstation: [id number]   DG Ribs Unilateral W/Chest Right Result Date: 03/24/2024 EXAM: 1 AP VIEW(S) XRAY OF THE UNILATERAL RIBS AND CHEST 03/24/2024 08:56:00 PM COMPARISON: CT chest. 03/04/2024. CLINICAL HISTORY: rib pain FINDINGS: BONES: Subacute 6, 7, 8, 9, 10 posterior rib fractures. Osseous fissure noted. LUNGS AND PLEURA: 2 cm nodular opacity in right suprahilar region. No consolidation or pulmonary edema. No pleural effusion or pneumothorax. HEART AND MEDIASTINUM: No acute abnormality of the cardiac and mediastinal silhouettes. IMPRESSION: 1. Subacute posterior rib fractures of ribs 610 and osseous fissure. 2. A 2 cm nodular opacity in the right suprahilar region that may be related to the finding on CT chest at 03/04/24. Electronically signed by: Morgane Naveau MD 03/24/2024 09:04 PM EST RP Workstation: [id number]     Procedures   Medications Ordered in the ED  acetaminophen  (TYLENOL ) tablet 1,000 mg (1,000 mg Oral Given 03/24/24 2020)  methocarbamol  (ROBAXIN ) tablet 1,000 mg (1,000 mg Oral Given 03/24/24 2020)  sodium chloride  0.9 % bolus 1,000 mL (0 mLs Intravenous Stopped 03/24/24 2136)                                    Medical Decision Making Social determinants of health: history of alcohol  abuse, history of  homelessness  Patient here for rib pain.  Told triage nurse that he was suicidal and wanted medical detox for alcohol . Last drink a few hours ago. He did not endorse this to me at all.  He denied this when I flat out asked him if he was suicidal.  States he is not suicidal. States he does not want to stop drinking. States he is just having rib pain from a previous fall that was weeks ago.He was given robaxin  and tylenol  here.   He eloped prior to receiving discharge papers.   Problems Addressed: Alcohol  abuse: chronic illness or injury Rib pain: acute illness or injury  Amount and/or Complexity of Data Reviewed External Data Reviewed: notes.    Details: Prior ED records reviewed and patient with history of homelessness Labs:  Decision-making details documented in ED Course.    Details: Labs were not ordered by me but alcohol  was elevated Radiology: ordered and independent interpretation performed. Decision-making details documented in ED Course.    Details: Ordered and inter by me independently radiology Bilateral rib x-ray shows old fracture but no acute abnormality  Risk OTC drugs. Prescription drug management. Diagnosis or treatment significantly limited by social determinants of health.     Final diagnoses:  Rib pain  Alcohol  abuse    ED Discharge Orders     None          Gennaro Duwaine CROME, DO 03/25/24 0017

## 2024-03-24 NOTE — ED Triage Notes (Signed)
 [place] [facility] [place] ETOH problem , last drink today hours ago Also reports suicidal thoughts  since I was born Denies current plan. Reports I'm a homeless drunk Appears to be impaired at the time. Reports hx of ETOH withdrawal, requesting medical detox   Last VS:   CBG 106, 88pulse, 93%02, bp 120 palp.

## 2024-03-24 NOTE — ED Notes (Signed)
 Upon checking on patient he was not found in stretcher. IV was pulled out and fluids spilling onto floor. No sign of patient in ER.

## 2024-03-28 ENCOUNTER — Emergency Department (HOSPITAL_COMMUNITY)
Admission: EM | Admit: 2024-03-28 | Discharge: 2024-03-28 | Disposition: A | Discharge status: Home / Self Care | Payer: MEDICAID | Attending: Emergency Medicine | Admitting: Emergency Medicine

## 2024-03-28 ENCOUNTER — Emergency Department (HOSPITAL_COMMUNITY): Payer: MEDICAID

## 2024-03-28 ENCOUNTER — Encounter (HOSPITAL_COMMUNITY): Payer: Self-pay

## 2024-03-28 ENCOUNTER — Other Ambulatory Visit: Payer: Self-pay

## 2024-03-28 DIAGNOSIS — Y908 Blood alcohol level of 240 mg/100 ml or more: Secondary | ICD-10-CM | POA: Insufficient documentation

## 2024-03-28 DIAGNOSIS — F1010 Alcohol abuse, uncomplicated: Secondary | ICD-10-CM | POA: Insufficient documentation

## 2024-03-28 DIAGNOSIS — R45851 Suicidal ideations: Secondary | ICD-10-CM | POA: Diagnosis not present

## 2024-03-28 DIAGNOSIS — Z720 Tobacco use: Secondary | ICD-10-CM | POA: Diagnosis not present

## 2024-03-28 DIAGNOSIS — F10120 Alcohol abuse with intoxication, uncomplicated: Secondary | ICD-10-CM | POA: Diagnosis present

## 2024-03-28 LAB — CBC
HCT: 40.1 % (ref 39.0–52.0)
Hemoglobin: 13.5 g/dL (ref 13.0–17.0)
MCH: 31.2 pg (ref 26.0–34.0)
MCHC: 33.7 g/dL (ref 30.0–36.0)
MCV: 92.6 fL (ref 80.0–100.0)
Platelets: 285 K/uL (ref 150–400)
RBC: 4.33 MIL/uL (ref 4.22–5.81)
RDW: 15.9 % — ABNORMAL HIGH (ref 11.5–15.5)
WBC: 6.2 K/uL (ref 4.0–10.5)
nRBC: 0 % (ref 0.0–0.2)

## 2024-03-28 LAB — COMPREHENSIVE METABOLIC PANEL WITH GFR
ALT: 53 U/L — ABNORMAL HIGH (ref 0–44)
AST: 97 U/L — ABNORMAL HIGH (ref 15–41)
Albumin: 4.4 g/dL (ref 3.5–5.0)
Alkaline Phosphatase: 138 U/L — ABNORMAL HIGH (ref 38–126)
Anion gap: 9 (ref 5–15)
BUN: <5 mg/dL — ABNORMAL LOW (ref 6–20)
CO2: 29 mmol/L (ref 22–32)
Calcium: 9.7 mg/dL (ref 8.9–10.3)
Chloride: 95 mmol/L — ABNORMAL LOW (ref 98–111)
Creatinine, Ser: 0.58 mg/dL — ABNORMAL LOW (ref 0.61–1.24)
GFR, Estimated: >60 mL/min (ref >60)
Glucose, Bld: 145 mg/dL — ABNORMAL HIGH (ref 70–99)
Potassium: 4.4 mmol/L (ref 3.5–5.1)
Sodium: 134 mmol/L — ABNORMAL LOW (ref 135–145)
Total Bilirubin: 0.4 mg/dL (ref 0.0–1.2)
Total Protein: 8.3 g/dL — ABNORMAL HIGH (ref 6.5–8.1)

## 2024-03-28 LAB — URINE DRUG SCREEN
Amphetamines: NEGATIVE
Barbiturates: NEGATIVE
Benzodiazepines: NEGATIVE
Cocaine: NEGATIVE
Fentanyl: NEGATIVE
Methadone Scn, Ur: NEGATIVE
Opiates: NEGATIVE
Tetrahydrocannabinol: NEGATIVE

## 2024-03-28 LAB — ETHANOL: Alcohol, Ethyl (B): >450 mg/dL (ref <15)

## 2024-03-28 MED ORDER — LORAZEPAM 1 MG PO TABS: 0.0000 mg | ORAL_TABLET | Freq: Two times a day (BID) | ORAL | Status: DC

## 2024-03-28 MED ORDER — THIAMINE MONONITRATE 100 MG PO TABS
100.0000 mg | ORAL_TABLET | Freq: Every day | ORAL | Status: DC
  Filled 2024-03-28: qty 1

## 2024-03-28 MED ORDER — LORAZEPAM 2 MG/ML IJ SOLN: 0.0000 mg | Freq: Two times a day (BID) | INTRAMUSCULAR | Status: DC

## 2024-03-28 MED ORDER — LORAZEPAM 1 MG PO TABS: 0.0000 mg | ORAL_TABLET | Freq: Four times a day (QID) | ORAL | Status: DC

## 2024-03-28 MED ORDER — THIAMINE HCL 100 MG/ML IJ SOLN: 100.0000 mg | Freq: Every day | INTRAMUSCULAR | Status: DC

## 2024-03-28 MED ORDER — LORAZEPAM 2 MG/ML IJ SOLN: 0.0000 mg | Freq: Four times a day (QID) | INTRAMUSCULAR | Status: DC

## 2024-03-28 NOTE — ED Triage Notes (Signed)
 [place]. Pt reports he didn't drink enough alcohol  today. He also states It is a wonderful day to die. PT doesn't elaborate on a plan.

## 2024-03-28 NOTE — ED Provider Notes (Signed)
 [place] EMERGENCY DEPARTMENT AT [facility] Provider Note   CSN: [id number] Arrival date & time: 03/28/24  1158     Patient presents with: Suicidal and Alcohol  Intoxication   Gary Mclaughlin is a 53 y.o. male.   Patient here with suicidal thoughts after drinking alcohol  today.  He is saying that it is a wonderful day to die but he does not have a specific plan.  He denies any trauma.  Denies any chest pain shortness of breath weakness numbness tingling.  He has a history of subarachnoid hemorrhage seizure tobacco abuse.  He denies any extremity pain.  Denies any fever or chills.  He is currently homeless.  The history is provided by the patient.       Prior to Admission medications   Medication Sig Start Date End Date Taking? Authorizing Provider  albuterol  (VENTOLIN  HFA) 108 (90 Base) MCG/ACT inhaler Inhale 2 puffs into the lungs every 6 (six) hours as needed for wheezing or shortness of breath. 02/26/24   Vicci Burnard SAUNDERS, PA-C  budesonide -formoterol  (SYMBICORT ) 160-4.5 MCG/ACT inhaler Inhale 2 puffs into the lungs 2 (two) times daily. 02/26/24   Vicci Burnard SAUNDERS, PA-C  cetirizine  (ZYRTEC  ALLERGY) 10 MG tablet Take 1 tablet (10 mg total) by mouth daily. 02/26/24   Vicci Burnard SAUNDERS, PA-C  chlordiazePOXIDE  (LIBRIUM ) 10 MG capsule Please take by mouth: 1 capsule (10mg  total) 4 times daily x 3 days, then 1 capsule (10mg  total) 3 times daily x 3 days, then 1 capsule (10mg  total) 2 times daily x 3 days, then 1 capsule (10mg  total) once daily for 3 days then stop. 03/08/24   Elgergawy, Brayton RAMAN, MD  escitalopram  (LEXAPRO ) 10 MG tablet Take 1 tablet (10 mg total) by mouth daily. 02/26/24   Vicci Burnard SAUNDERS, PA-C  folic acid  (FOLVITE ) 1 MG tablet Take 1 tablet (1 mg total) by mouth daily. 03/09/24   Elgergawy, Brayton RAMAN, MD  gabapentin  (NEURONTIN ) 300 MG capsule Take 1 capsule (300 mg total) by mouth 3 (three) times daily. Patient taking differently: Take 300 mg by mouth daily.  02/26/24   Vicci Burnard SAUNDERS, PA-C  guaiFENesin  (MUCINEX ) 600 MG 12 hr tablet Take 1 tablet (600 mg total) by mouth 2 (two) times daily as needed for to loosen phlegm. Patient taking differently: Take 600 mg by mouth daily. 02/26/24   Vicci Burnard SAUNDERS, PA-C  hydrOXYzine  (ATARAX ) 25 MG tablet Take 1 tablet (25 mg total) by mouth 3 (three) times daily as needed for anxiety. Patient taking differently: Take 25 mg by mouth daily. 02/26/24   Vicci Burnard SAUNDERS, PA-C  lidocaine  (LIDODERM ) 5 % Place 1 patch onto the skin daily. Remove & Discard patch within 12 hours or as directed by MD Patient not taking: Reported on 03/05/2024 02/27/24   Vicci Burnard SAUNDERS, PA-C  methocarbamol  (ROBAXIN ) 500 MG tablet Take 2 tablets (1,000 mg total) by mouth every 8 (eight) hours as needed for muscle spasms. Patient taking differently: Take 1,000 mg by mouth daily. 02/26/24   Vicci Burnard SAUNDERS, PA-C  nicotine  (NICODERM CQ  - DOSED IN MG/24 HOURS) 14 mg/24hr patch Place 1 patch (14 mg total) onto the skin daily. Patient not taking: Reported on 03/05/2024 02/27/24   Vicci Burnard SAUNDERS, PA-C  oxyCODONE  (OXY IR/ROXICODONE ) 5 MG immediate release tablet Take 1 tablet (5 mg total) by mouth every 6 (six) hours as needed for moderate pain (pain score 4-6). 03/08/24   Elgergawy, Brayton RAMAN, MD  thiamine  (VITAMIN B-1) 100 MG tablet  Take 1 tablet (100 mg total) by mouth daily. 03/09/24   Elgergawy, Brayton RAMAN, MD    Allergies: Patient has no known allergies.    Review of Systems  Updated Vital Signs BP 133/87   Pulse 74   Temp 97.7 F (36.5 C) (Oral)   Resp 18   SpO2 98%   Physical Exam Vitals and nursing note reviewed.  Constitutional:      General: He is not in acute distress.    Appearance: He is well-developed. He is not ill-appearing.  HENT:     Head: Normocephalic and atraumatic.     Nose: Nose normal.     Mouth/Throat:     Mouth: Mucous membranes are moist.  Eyes:     Extraocular Movements: Extraocular movements intact.      Conjunctiva/sclera: Conjunctivae normal.     Pupils: Pupils are equal, round, and reactive to light.  Cardiovascular:     Rate and Rhythm: Normal rate and regular rhythm.     Pulses: Normal pulses.     Heart sounds: Normal heart sounds. No murmur heard. Pulmonary:     Effort: Pulmonary effort is normal. No respiratory distress.     Breath sounds: Normal breath sounds.  Abdominal:     General: Abdomen is flat.     Palpations: Abdomen is soft.     Tenderness: There is no abdominal tenderness.  Musculoskeletal:        General: No swelling.     Cervical back: Normal range of motion and neck supple.  Skin:    General: Skin is warm and dry.     Capillary Refill: Capillary refill takes less than 2 seconds.  Neurological:     General: No focal deficit present.     Mental Status: He is alert and oriented to person, place, and time.     Cranial Nerves: No cranial nerve deficit.     Sensory: No sensory deficit.     Motor: No weakness.     Coordination: Coordination normal.     Comments: Patient appears intoxicated but is able to ambulate without any major issues normal strength sensation and speech  Psychiatric:     Comments: Endorsing some suicidal thoughts with somewhat passive     (all labs ordered are listed, but only abnormal results are displayed) Labs Reviewed  COMPREHENSIVE METABOLIC PANEL WITH GFR - Abnormal; Notable for the following components:      Result Value   Sodium 134 (*)    Chloride 95 (*)    Glucose, Bld 145 (*)    BUN <5 (*)    Creatinine, Ser 0.58 (*)    Total Protein 8.3 (*)    AST 97 (*)    ALT 53 (*)    Alkaline Phosphatase 138 (*)    All other components within normal limits  ETHANOL - Abnormal; Notable for the following components:   Alcohol , Ethyl (B) >450 (*)    All other components within normal limits  CBC - Abnormal; Notable for the following components:   RDW 15.9 (*)    All other components within normal limits  URINE DRUG SCREEN     EKG: None  Radiology: No results found.   Procedures   Medications Ordered in the ED  LORazepam  (ATIVAN ) injection 0-4 mg (has no administration in time range)    Or  LORazepam  (ATIVAN ) tablet 0-4 mg (has no administration in time range)  LORazepam  (ATIVAN ) injection 0-4 mg (has no administration in time range)  Or  LORazepam  (ATIVAN ) tablet 0-4 mg (has no administration in time range)  thiamine  (VITAMIN B1) tablet 100 mg (has no administration in time range)    Or  thiamine  (VITAMIN B1) injection 100 mg (has no administration in time range)                                    Medical Decision Making Amount and/or Complexity of Data Reviewed Labs: ordered. Radiology: ordered.  Risk OTC drugs. Prescription drug management.   Cantrell Votaw is here with alcohol  intoxication and suicidal thoughts.  He is passively suicidal.  Does not really have any specific plan.  He is been drinking alcohol  today.  He is asking for something to eat.  Right now he is pretty intoxicated, seems like several visits for the same.  He usually recants suicidal thoughts when he sobers up.  He says that he was 9 years sober in the past.  Seems like he has some motivation to try to get sober but does not seem like he has been able to maybe get resources for this.  Ultimately at this time we will do some medical clearance labs get a head CT to make sure no injuries.  Overall we will let him sober up and reengage with him about social situation and his motivation to maybe try to get sober or seek other help.    No significant leukocytosis anemia or electrolyte abnormality.  Liver enzymes mildly elevated.  Alcohol  level greater than 450.  Patient is ambulatory able to hold conversation.  Will get CT of his head and neck.  Will let him sober up and talk with him about maybe talking to psychiatry or not.  Overall images thus far unremarkable.  Patient to be reevaluated.  Will discuss his mental health.   Feel like suicidal thoughts are more likely chronic but will like to see him more sober to see if he maintains those thoughts.  I do think his visit could be for social issues/homelessness.  This chart was dictated using voice recognition software.  Despite best efforts to proofread,  errors can occur which can change the documentation meaning.      Final diagnoses:  Alcohol  abuse    ED Discharge Orders     None          Ruthe Cornet, DO 03/28/24 1435

## 2024-03-28 NOTE — ED Provider Notes (Addendum)
 I was notified that pt had eloped from the ED.  Per RN, was walking with a steady gait, not slurring his words.   Lenor Hollering, MD 03/28/24 8379    Lenor Hollering, MD 03/28/24 [phone]

## 2024-03-28 NOTE — ED Notes (Signed)
 Patient woke up and tried getting out of bed to leave. Redirected patient to bathroom. Patient then assisted back to bed when patient refused. Patient left out of ED. Provider made aware. Unable to find patient in waiting room or in front of ED entrance. Patient was ambulatory with steady gait.

## 2024-04-04 ENCOUNTER — Emergency Department (HOSPITAL_COMMUNITY): Admission: EM | Admit: 2024-04-04 | Discharge: 2024-04-05 | Disposition: A | Payer: MEDICAID

## 2024-04-04 ENCOUNTER — Other Ambulatory Visit: Payer: Self-pay

## 2024-04-04 ENCOUNTER — Emergency Department (HOSPITAL_COMMUNITY): Payer: MEDICAID

## 2024-04-04 ENCOUNTER — Encounter (HOSPITAL_COMMUNITY): Payer: Self-pay | Admitting: Emergency Medicine

## 2024-04-04 DIAGNOSIS — F1090 Alcohol use, unspecified, uncomplicated: Secondary | ICD-10-CM | POA: Diagnosis present

## 2024-04-04 DIAGNOSIS — F1010 Alcohol abuse, uncomplicated: Secondary | ICD-10-CM | POA: Insufficient documentation

## 2024-04-04 DIAGNOSIS — R45851 Suicidal ideations: Secondary | ICD-10-CM | POA: Diagnosis not present

## 2024-04-04 DIAGNOSIS — Y908 Blood alcohol level of 240 mg/100 ml or more: Secondary | ICD-10-CM | POA: Insufficient documentation

## 2024-04-04 LAB — CBC
HCT: 33.3 % — ABNORMAL LOW (ref 39.0–52.0)
Hemoglobin: 11.1 g/dL — ABNORMAL LOW (ref 13.0–17.0)
MCH: 31.4 pg (ref 26.0–34.0)
MCHC: 33.3 g/dL (ref 30.0–36.0)
MCV: 94.1 fL (ref 80.0–100.0)
Platelets: 214 K/uL (ref 150–400)
RBC: 3.54 MIL/uL — ABNORMAL LOW (ref 4.22–5.81)
RDW: 16 % — ABNORMAL HIGH (ref 11.5–15.5)
WBC: 7 K/uL (ref 4.0–10.5)
nRBC: 0 % (ref 0.0–0.2)

## 2024-04-04 LAB — COMPREHENSIVE METABOLIC PANEL WITH GFR
ALT: 63 U/L — ABNORMAL HIGH (ref 0–44)
AST: 91 U/L — ABNORMAL HIGH (ref 15–41)
Albumin: 4.1 g/dL (ref 3.5–5.0)
Alkaline Phosphatase: 130 U/L — ABNORMAL HIGH (ref 38–126)
Anion gap: 10 (ref 5–15)
BUN: 13 mg/dL (ref 6–20)
CO2: 28 mmol/L (ref 22–32)
Calcium: 10 mg/dL (ref 8.9–10.3)
Chloride: 98 mmol/L (ref 98–111)
Creatinine, Ser: 0.74 mg/dL (ref 0.61–1.24)
GFR, Estimated: >60 mL/min (ref >60)
Glucose, Bld: 89 mg/dL (ref 70–99)
Potassium: 3.7 mmol/L (ref 3.5–5.1)
Sodium: 137 mmol/L (ref 135–145)
Total Bilirubin: 0.4 mg/dL (ref 0.0–1.2)
Total Protein: 7.3 g/dL (ref 6.5–8.1)

## 2024-04-04 LAB — ACETAMINOPHEN LEVEL: Acetaminophen (Tylenol), Serum: <10 ug/mL — ABNORMAL LOW (ref 10–30)

## 2024-04-04 LAB — SALICYLATE LEVEL: Salicylate Lvl: <7 mg/dL — ABNORMAL LOW (ref 7.0–30.0)

## 2024-04-04 LAB — ETHANOL: Alcohol, Ethyl (B): 302 mg/dL (ref <15)

## 2024-04-04 MED ORDER — ONDANSETRON HCL 4 MG/2ML IJ SOLN
4.0000 mg | Freq: Once | INTRAMUSCULAR | Status: AC
  Administered 2024-04-04: 4 mg via INTRAVENOUS
  Filled 2024-04-04: qty 2

## 2024-04-04 MED ORDER — LORAZEPAM 2 MG/ML IJ SOLN: 0.0000 mg | Freq: Two times a day (BID) | INTRAMUSCULAR | Status: DC

## 2024-04-04 MED ORDER — LORAZEPAM 2 MG/ML IJ SOLN: 0.0000 mg | Freq: Four times a day (QID) | INTRAMUSCULAR | Status: DC

## 2024-04-04 MED ORDER — NALOXONE HCL 0.4 MG/ML IJ SOLN
0.4000 mg | Freq: Once | INTRAMUSCULAR | Status: AC
  Administered 2024-04-04: 0.4 mg via INTRAVENOUS
  Filled 2024-04-04: qty 1

## 2024-04-04 MED ORDER — LORAZEPAM 1 MG PO TABS: 0.0000 mg | ORAL_TABLET | Freq: Two times a day (BID) | ORAL | Status: DC

## 2024-04-04 MED ORDER — THIAMINE MONONITRATE 100 MG PO TABS
100.0000 mg | ORAL_TABLET | Freq: Every day | ORAL | Status: DC
  Administered 2024-04-04: 100 mg via ORAL
  Filled 2024-04-04: qty 1

## 2024-04-04 MED ORDER — THIAMINE HCL 100 MG/ML IJ SOLN: 100.0000 mg | Freq: Every day | INTRAMUSCULAR | Status: DC

## 2024-04-04 MED ORDER — LACTATED RINGERS IV BOLUS
1000.0000 mL | Freq: Once | INTRAVENOUS | Status: AC
  Administered 2024-04-04: 1000 mL via INTRAVENOUS

## 2024-04-04 MED ORDER — LORAZEPAM 1 MG PO TABS
0.0000 mg | ORAL_TABLET | Freq: Four times a day (QID) | ORAL | Status: DC
  Administered 2024-04-04: 1 mg via ORAL
  Filled 2024-04-04: qty 1

## 2024-04-04 NOTE — ED Notes (Signed)
 I asked the young could patient have a sandwich. He said yes and sandwich and water were given.

## 2024-04-04 NOTE — ED Triage Notes (Signed)
 Patient presents with the request for alcohol  detox. His last drink was one hour ago. He reports drinking a 40 ox today which he said was not enough.

## 2024-04-04 NOTE — ED Notes (Signed)
 CIWA 5.  Oral ativan  and thiamine  given.  Seizure pads on, fall risk bracelet on.  Pt said, I am ready to sleep. I have not slept in three days.

## 2024-04-04 NOTE — ED Notes (Addendum)
 Pt moved to room 25, spo2 80 on Room Air.  Pt put on nasal cannula at 2 L.  RR 18, good breaths.  Arouses to name.

## 2024-04-04 NOTE — ED Provider Notes (Signed)
  EMERGENCY DEPARTMENT AT [facility] Provider Note   CSN: [id number] Arrival date & time: 04/04/24  1726     Patient presents with: Alcohol  Problem   Gary Mclaughlin is a 53 y.o. male.   This is a 53 year old male presenting emergency department for alcohol  use disorder.  Reports drinking heavily up to a pint of vodka a day.  Last drink an hour prior to arrival.  However, he states he would like to stop drinking and detox.  Reports he thinks he might be starting to withdraw.  Denies hallucinations, tremulousness, nausea, vomiting.  Endorses some suicidal ideation, but denies toxic ingestion prior to arrival.  Denies chest pain or shortness of breath.  No abdominal pain.  Reports he has had a seizure in the past due to alcohol  withdrawal.   Alcohol  Problem       Prior to Admission medications   Medication Sig Start Date End Date Taking? Authorizing Provider  albuterol  (VENTOLIN  HFA) 108 (90 Base) MCG/ACT inhaler Inhale 2 puffs into the lungs every 6 (six) hours as needed for wheezing or shortness of breath. 02/26/24   Vicci Burnard SAUNDERS, PA-C  budesonide -formoterol  (SYMBICORT ) 160-4.5 MCG/ACT inhaler Inhale 2 puffs into the lungs 2 (two) times daily. 02/26/24   Vicci Burnard SAUNDERS, PA-C  cetirizine  (ZYRTEC  ALLERGY) 10 MG tablet Take 1 tablet (10 mg total) by mouth daily. 02/26/24   Vicci Burnard SAUNDERS, PA-C  chlordiazePOXIDE  (LIBRIUM ) 10 MG capsule Please take by mouth: 1 capsule (10mg  total) 4 times daily x 3 days, then 1 capsule (10mg  total) 3 times daily x 3 days, then 1 capsule (10mg  total) 2 times daily x 3 days, then 1 capsule (10mg  total) once daily for 3 days then stop. 03/08/24   Elgergawy, Brayton RAMAN, MD  escitalopram  (LEXAPRO ) 10 MG tablet Take 1 tablet (10 mg total) by mouth daily. 02/26/24   Vicci Burnard SAUNDERS, PA-C  folic acid  (FOLVITE ) 1 MG tablet Take 1 tablet (1 mg total) by mouth daily. 03/09/24   Elgergawy, Brayton RAMAN, MD  gabapentin  (NEURONTIN ) 300 MG capsule Take  1 capsule (300 mg total) by mouth 3 (three) times daily. Patient taking differently: Take 300 mg by mouth daily. 02/26/24   Vicci Burnard SAUNDERS, PA-C  guaiFENesin  (MUCINEX ) 600 MG 12 hr tablet Take 1 tablet (600 mg total) by mouth 2 (two) times daily as needed for to loosen phlegm. Patient taking differently: Take 600 mg by mouth daily. 02/26/24   Vicci Burnard SAUNDERS, PA-C  hydrOXYzine  (ATARAX ) 25 MG tablet Take 1 tablet (25 mg total) by mouth 3 (three) times daily as needed for anxiety. Patient taking differently: Take 25 mg by mouth daily. 02/26/24   Vicci Burnard SAUNDERS, PA-C  lidocaine  (LIDODERM ) 5 % Place 1 patch onto the skin daily. Remove & Discard patch within 12 hours or as directed by MD Patient not taking: Reported on 03/05/2024 02/27/24   Vicci Burnard SAUNDERS, PA-C  methocarbamol  (ROBAXIN ) 500 MG tablet Take 2 tablets (1,000 mg total) by mouth every 8 (eight) hours as needed for muscle spasms. Patient taking differently: Take 1,000 mg by mouth daily. 02/26/24   Vicci Burnard SAUNDERS, PA-C  nicotine  (NICODERM CQ  - DOSED IN MG/24 HOURS) 14 mg/24hr patch Place 1 patch (14 mg total) onto the skin daily. Patient not taking: Reported on 03/05/2024 02/27/24   Vicci Burnard SAUNDERS, PA-C  oxyCODONE  (OXY IR/ROXICODONE ) 5 MG immediate release tablet Take 1 tablet (5 mg total) by mouth every 6 (six) hours as needed for  moderate pain (pain score 4-6). 03/08/24   Elgergawy, Brayton RAMAN, MD  thiamine  (VITAMIN B-1) 100 MG tablet Take 1 tablet (100 mg total) by mouth daily. 03/09/24   Elgergawy, Brayton RAMAN, MD    Allergies: Patient has no known allergies.    Review of Systems  Updated Vital Signs BP 99/70   Pulse 80   Temp 97.7 F (36.5 C)   Resp 15   Ht 5' 8 (1.727 m)   Wt 68.9 kg   SpO2 97%   BMI 23.10 kg/m   Physical Exam Vitals and nursing note reviewed.  HENT:     Head: Normocephalic.     Mouth/Throat:     Mouth: Mucous membranes are moist.  Eyes:     Conjunctiva/sclera: Conjunctivae normal.   Cardiovascular:     Rate and Rhythm: Normal rate and regular rhythm.  Pulmonary:     Effort: Pulmonary effort is normal.  Abdominal:     General: Abdomen is flat. There is no distension.     Palpations: Abdomen is soft.     Tenderness: There is no abdominal tenderness. There is no guarding or rebound.  Musculoskeletal:        General: Normal range of motion.  Skin:    General: Skin is warm and dry.     Capillary Refill: Capillary refill takes less than 2 seconds.  Neurological:     General: No focal deficit present.     Mental Status: He is alert.  Psychiatric:     Comments: Intoxicated     (all labs ordered are listed, but only abnormal results are displayed) Labs Reviewed  COMPREHENSIVE METABOLIC PANEL WITH GFR - Abnormal; Notable for the following components:      Result Value   AST 91 (*)    ALT 63 (*)    Alkaline Phosphatase 130 (*)    All other components within normal limits  ETHANOL - Abnormal; Notable for the following components:   Alcohol , Ethyl (B) 302 (*)    All other components within normal limits  CBC - Abnormal; Notable for the following components:   RBC 3.54 (*)    Hemoglobin 11.1 (*)    HCT 33.3 (*)    RDW 16.0 (*)    All other components within normal limits  ACETAMINOPHEN  LEVEL - Abnormal; Notable for the following components:   Acetaminophen  (Tylenol ), Serum <10 (*)    All other components within normal limits  SALICYLATE LEVEL - Abnormal; Notable for the following components:   Salicylate Lvl <7.0 (*)    All other components within normal limits  URINE DRUG SCREEN    EKG: EKG Interpretation Date/Time:  Monday April 04 2024 19:17:02 EST Ventricular Rate:  70 PR Interval:  168 QRS Duration:  96 QT Interval:  398 QTC Calculation: 429 R Axis:   90  Text Interpretation: Normal sinus rhythm Rightward axis Borderline ECG When compared with ECG of 04-Mar-2024 18:43, PREVIOUS ECG IS PRESENT Confirmed by Neysa Clap [phone]) on 04/04/2024  8:19:33 PM  Radiology: [facility] Chest Portable 1 View Result Date: 04/04/2024 CLINICAL DATA:  Hypoxia, ETOH. EXAM: PORTABLE CHEST 1 VIEW COMPARISON:  March 28, 2024 FINDINGS: The heart size and mediastinal contours are within normal limits. No acute infiltrate, pleural effusion or pneumothorax is identified. The visualized skeletal structures are unremarkable. IMPRESSION: No active disease. Electronically Signed   By: Suzen Dials M.D.   On: 04/04/2024 21:43     Procedures   Medications Ordered in the ED  LORazepam  (ATIVAN )  injection 0-4 mg ( Intravenous See Alternative 04/04/24 1949)    Or  LORazepam  (ATIVAN ) tablet 0-4 mg (1 mg Oral Given 04/04/24 1949)  LORazepam  (ATIVAN ) injection 0-4 mg (has no administration in time range)    Or  LORazepam  (ATIVAN ) tablet 0-4 mg (has no administration in time range)  thiamine  (VITAMIN B1) tablet 100 mg (100 mg Oral Given 04/04/24 1950)    Or  thiamine  (VITAMIN B1) injection 100 mg ( Intravenous See Alternative 04/04/24 1950)  lactated ringers  bolus 1,000 mL (1,000 mLs Intravenous New Bag/Given 04/04/24 2208)    Clinical Course as of 04/04/24 2303  Mon Apr 04, 2024  2054 Alcohol , Ethyl (B)(!!): 302 [TY]  2054 Salicylate Lvl(!): <7.0 [TY]  2055 Acetaminophen  (Tylenol ), S(!): <10 [TY]  2113 Comprehensive metabolic panel(!) Transaminitis consistent with his heavy alcohol  consumption.  Soft nontender abdomen with no right upper quadrant pain. [TY]  2113 cbc(!) No leukocytosis.  Stable anemia [TY]  2125 SpO2(!): 80 % Patient did desaturate, placed on nasal cannula.  Likely secondary to his alcohol  use as well as the Ativan  given.  He is somnolent, but will arouse to touch and tell me his name.  Lungs with coarse breath sounds, but equal.  Does not appear to be in distress.  Will get chest x-ray to evaluate. [TY]  2259 Patient still somnolent, but is protecting his airway satting in the upper 90s.  Will awake momentarily to speak, but falls asleep  shortly thereafter. [TY]  2303 Care signed out to overnight team.  Dispo pending clinical sobriety and reevaluation. [TY]    Clinical Course User Index [TY] Neysa Caron PARAS, DO                                 Medical Decision Making 53 year old male presenting emergency department for alcohol  abuse.  History of the same, prior subarachnoid hemorrhage and seizures.  Afebrile, nontachycardic, blood pressure on the softer side, but hemodynamically stable.  Clinically intoxicated, and endorses alcohol  use just prior to arrival.  He is also claiming some suicidal ideation, but per chart review does appear he does similar things while he is intoxicated and will often change his mind and recant once he sobers up..  Plan for screening labs and monitoring.  Thiamine  and CIWA protocall ordered for withdrawal symptoms although does not appear to be actively withdrawing currently.  CIWA of 2 on my initial examination.   Amount and/or Complexity of Data Reviewed Labs: ordered. Decision-making details documented in ED Course. Radiology: ordered and independent interpretation performed.    Details: Low suspicion for acute intracranial pathology.  No trauma and endorses EtOH.  Will forego CT head ECG/medicine tests: ordered and independent interpretation performed.  Risk OTC drugs. Prescription drug management. Decision regarding hospitalization. Diagnosis or treatment significantly limited by social determinants of health. Risk Details: Homeless.       Final diagnoses:  None    ED Discharge Orders     None          Neysa Caron PARAS, DO 04/04/24 2303

## 2024-04-05 NOTE — ED Provider Notes (Signed)
 PO challenged and ambulated about the department.  No longer SI.  Stable for discharge.  Strict returns.     Griffin Gerrard, MD 04/05/24 9789

## 2024-04-05 NOTE — ED Notes (Signed)
Unable to give urine specimen

## 2024-04-05 NOTE — ED Notes (Signed)
Pt given carton of milk

## 2024-04-05 NOTE — ED Notes (Signed)
 Patient give sausage biscuit and cup of milk- able to keep it down

## 2024-04-11 ENCOUNTER — Other Ambulatory Visit: Payer: Self-pay

## 2024-04-11 ENCOUNTER — Emergency Department (HOSPITAL_COMMUNITY)
Admission: EM | Admit: 2024-04-11 | Discharge: 2024-04-11 | Disposition: A | Discharge status: Home / Self Care | Payer: MEDICAID | Attending: Emergency Medicine | Admitting: Emergency Medicine

## 2024-04-11 ENCOUNTER — Encounter (HOSPITAL_COMMUNITY): Payer: Self-pay | Admitting: *Deleted

## 2024-04-11 DIAGNOSIS — F10129 Alcohol abuse with intoxication, unspecified: Secondary | ICD-10-CM | POA: Insufficient documentation

## 2024-04-11 DIAGNOSIS — F10920 Alcohol use, unspecified with intoxication, uncomplicated: Secondary | ICD-10-CM

## 2024-04-11 DIAGNOSIS — T68XXXA Hypothermia, initial encounter: Secondary | ICD-10-CM

## 2024-04-11 DIAGNOSIS — D649 Anemia, unspecified: Secondary | ICD-10-CM | POA: Insufficient documentation

## 2024-04-11 DIAGNOSIS — Y908 Blood alcohol level of 240 mg/100 ml or more: Secondary | ICD-10-CM | POA: Insufficient documentation

## 2024-04-11 DIAGNOSIS — J449 Chronic obstructive pulmonary disease, unspecified: Secondary | ICD-10-CM | POA: Insufficient documentation

## 2024-04-11 DIAGNOSIS — R7401 Elevation of levels of liver transaminase levels: Secondary | ICD-10-CM | POA: Insufficient documentation

## 2024-04-11 DIAGNOSIS — X31XXXA Exposure to excessive natural cold, initial encounter: Secondary | ICD-10-CM | POA: Insufficient documentation

## 2024-04-11 DIAGNOSIS — Z7951 Long term (current) use of inhaled steroids: Secondary | ICD-10-CM | POA: Insufficient documentation

## 2024-04-11 DIAGNOSIS — Z5900 Homelessness unspecified: Secondary | ICD-10-CM | POA: Insufficient documentation

## 2024-04-11 LAB — COMPREHENSIVE METABOLIC PANEL WITH GFR
ALT: 71 U/L — ABNORMAL HIGH (ref 0–44)
AST: 103 U/L — ABNORMAL HIGH (ref 15–41)
Albumin: 4.3 g/dL (ref 3.5–5.0)
Alkaline Phosphatase: 139 U/L — ABNORMAL HIGH (ref 38–126)
Anion gap: 16 — ABNORMAL HIGH (ref 5–15)
BUN: 6 mg/dL (ref 6–20)
CO2: 26 mmol/L (ref 22–32)
Calcium: 9.9 mg/dL (ref 8.9–10.3)
Chloride: 99 mmol/L (ref 98–111)
Creatinine, Ser: 0.5 mg/dL — ABNORMAL LOW (ref 0.61–1.24)
GFR, Estimated: >60 mL/min (ref >60)
Glucose, Bld: 83 mg/dL (ref 70–99)
Potassium: 3.7 mmol/L (ref 3.5–5.1)
Sodium: 141 mmol/L (ref 135–145)
Total Bilirubin: 0.4 mg/dL (ref 0.0–1.2)
Total Protein: 7.6 g/dL (ref 6.5–8.1)

## 2024-04-11 LAB — ETHANOL: Alcohol, Ethyl (B): 329 mg/dL (ref <15)

## 2024-04-11 LAB — CBC
HCT: 38.3 % — ABNORMAL LOW (ref 39.0–52.0)
Hemoglobin: 12.5 g/dL — ABNORMAL LOW (ref 13.0–17.0)
MCH: 30.8 pg (ref 26.0–34.0)
MCHC: 32.6 g/dL (ref 30.0–36.0)
MCV: 94.3 fL (ref 80.0–100.0)
Platelets: 315 K/uL (ref 150–400)
RBC: 4.06 MIL/uL — ABNORMAL LOW (ref 4.22–5.81)
RDW: 15.9 % — ABNORMAL HIGH (ref 11.5–15.5)
WBC: 6 K/uL (ref 4.0–10.5)
nRBC: 0 % (ref 0.0–0.2)

## 2024-04-11 LAB — I-STAT CG4 LACTIC ACID, ED
Lactic Acid, Venous: 2.9 mmol/L (ref 0.5–1.9)
Lactic Acid, Venous: 2.4 mmol/L (ref 0.5–1.9)

## 2024-04-11 MED ORDER — SODIUM CHLORIDE 0.9 % IV BOLUS
1000.0000 mL | Freq: Once | INTRAVENOUS | Status: AC
  Administered 2024-04-11: 1000 mL via INTRAVENOUS

## 2024-04-11 NOTE — ED Notes (Signed)
 Patient cleaned up from feces using warm wash clothes. Pts skin noted to be red and discolored on his legs bilaterally. Pts temp rectally 95.3. Bair hugger placed at this time. Pt given food and a drink.

## 2024-04-11 NOTE — Discharge Instructions (Addendum)
 Recommend stopping drinking alcohol , seeking rehabilitation, please return if you have concern for acute alcohol  withdrawal.

## 2024-04-11 NOTE — ED Triage Notes (Signed)
 BIB EMS for ETOH Detox, drank 2- 40oz and a hell of a lot more today. 72-99% -16- CBG 99

## 2024-04-11 NOTE — ED Provider Notes (Signed)
  EMERGENCY DEPARTMENT AT [facility] Provider Note   CSN: [id number] Arrival date & time: 04/11/24  1610     Patient presents with: ETOH Detox   Thaison Mehlhoff is a 53 y.o. male with past medical history significant for alcohol  dependence, previous subarachnoid hemorrhage, seizure disorder, tobacco abuse, COPD, homelessness who presents VIA ems from street, cold, soaking wet, clinically intoxicated. No other complaints other than wanting to get warm.  Reporting that he needs to detox from alcohol  despite drinking to 40 ounces of malt liquor and a bunch of other stuff today   HPI     Prior to Admission medications   Medication Sig Start Date End Date Taking? Authorizing Provider  albuterol  (VENTOLIN  HFA) 108 (90 Base) MCG/ACT inhaler Inhale 2 puffs into the lungs every 6 (six) hours as needed for wheezing or shortness of breath. 02/26/24   Vicci Burnard SAUNDERS, PA-C  budesonide -formoterol  (SYMBICORT ) 160-4.5 MCG/ACT inhaler Inhale 2 puffs into the lungs 2 (two) times daily. 02/26/24   Vicci Burnard SAUNDERS, PA-C  cetirizine  (ZYRTEC  ALLERGY) 10 MG tablet Take 1 tablet (10 mg total) by mouth daily. 02/26/24   Vicci Burnard SAUNDERS, PA-C  chlordiazePOXIDE  (LIBRIUM ) 10 MG capsule Please take by mouth: 1 capsule (10mg  total) 4 times daily x 3 days, then 1 capsule (10mg  total) 3 times daily x 3 days, then 1 capsule (10mg  total) 2 times daily x 3 days, then 1 capsule (10mg  total) once daily for 3 days then stop. 03/08/24   Elgergawy, Brayton RAMAN, MD  escitalopram  (LEXAPRO ) 10 MG tablet Take 1 tablet (10 mg total) by mouth daily. 02/26/24   Vicci Burnard SAUNDERS, PA-C  folic acid  (FOLVITE ) 1 MG tablet Take 1 tablet (1 mg total) by mouth daily. 03/09/24   Elgergawy, Brayton RAMAN, MD  gabapentin  (NEURONTIN ) 300 MG capsule Take 1 capsule (300 mg total) by mouth 3 (three) times daily. Patient taking differently: Take 300 mg by mouth daily. 02/26/24   Vicci Burnard SAUNDERS, PA-C  guaiFENesin  (MUCINEX ) 600 MG 12  hr tablet Take 1 tablet (600 mg total) by mouth 2 (two) times daily as needed for to loosen phlegm. Patient taking differently: Take 600 mg by mouth daily. 02/26/24   Vicci Burnard SAUNDERS, PA-C  hydrOXYzine  (ATARAX ) 25 MG tablet Take 1 tablet (25 mg total) by mouth 3 (three) times daily as needed for anxiety. Patient taking differently: Take 25 mg by mouth daily. 02/26/24   Vicci Burnard SAUNDERS, PA-C  lidocaine  (LIDODERM ) 5 % Place 1 patch onto the skin daily. Remove & Discard patch within 12 hours or as directed by MD Patient not taking: Reported on 03/05/2024 02/27/24   Vicci Burnard SAUNDERS, PA-C  methocarbamol  (ROBAXIN ) 500 MG tablet Take 2 tablets (1,000 mg total) by mouth every 8 (eight) hours as needed for muscle spasms. Patient taking differently: Take 1,000 mg by mouth daily. 02/26/24   Vicci Burnard SAUNDERS, PA-C  nicotine  (NICODERM CQ  - DOSED IN MG/24 HOURS) 14 mg/24hr patch Place 1 patch (14 mg total) onto the skin daily. Patient not taking: Reported on 03/05/2024 02/27/24   Vicci Burnard SAUNDERS, PA-C  oxyCODONE  (OXY IR/ROXICODONE ) 5 MG immediate release tablet Take 1 tablet (5 mg total) by mouth every 6 (six) hours as needed for moderate pain (pain score 4-6). 03/08/24   Elgergawy, Brayton RAMAN, MD  thiamine  (VITAMIN B-1) 100 MG tablet Take 1 tablet (100 mg total) by mouth daily. 03/09/24   Elgergawy, Brayton RAMAN, MD    Allergies: Patient has no  known allergies.    Review of Systems  All other systems reviewed and are negative.   Updated Vital Signs BP 106/67 (BP Location: Right Arm)   Pulse 79   Temp 97.9 F (36.6 C) (Rectal)   Resp 14   Ht 5' 8 (1.727 m)   Wt 68.9 kg   SpO2 95%   BMI 23.10 kg/m   Physical Exam Vitals and nursing note reviewed.  Constitutional:      General: He is not in acute distress.    Appearance: Normal appearance.     Comments: Disheveled, soaking wet, cold to touch  HENT:     Head: Normocephalic and atraumatic.  Eyes:     General:        Right eye: No discharge.         Left eye: No discharge.  Cardiovascular:     Rate and Rhythm: Normal rate and regular rhythm.     Heart sounds: No murmur heard.    No friction rub. No gallop.  Pulmonary:     Effort: Pulmonary effort is normal.     Breath sounds: Normal breath sounds.  Abdominal:     General: Bowel sounds are normal.     Palpations: Abdomen is soft.  Skin:    General: Skin is warm and dry.     Capillary Refill: Capillary refill takes less than 2 seconds.     Comments: After patient was undressed, placed in Bair hugger, there is no evidence of open wound, ulcers, or other injury on all exposed skin surfaces  Neurological:     Mental Status: He is alert and oriented to person, place, and time.  Psychiatric:        Mood and Affect: Mood normal.        Behavior: Behavior normal.     (all labs ordered are listed, but only abnormal results are displayed) Labs Reviewed  CBC - Abnormal; Notable for the following components:      Result Value   RBC 4.06 (*)    Hemoglobin 12.5 (*)    HCT 38.3 (*)    RDW 15.9 (*)    All other components within normal limits  COMPREHENSIVE METABOLIC PANEL WITH GFR - Abnormal; Notable for the following components:   Creatinine, Ser 0.50 (*)    AST 103 (*)    ALT 71 (*)    Alkaline Phosphatase 139 (*)    Anion gap 16 (*)    All other components within normal limits  ETHANOL - Abnormal; Notable for the following components:   Alcohol , Ethyl (B) 329 (*)    All other components within normal limits  I-STAT CG4 LACTIC ACID, ED - Abnormal; Notable for the following components:   Lactic Acid, Venous 2.9 (*)    All other components within normal limits  I-STAT CG4 LACTIC ACID, ED - Abnormal; Notable for the following components:   Lactic Acid, Venous 2.4 (*)    All other components within normal limits    EKG: EKG Interpretation Date/Time:  Monday April 11 2024 17:29:45 EST Ventricular Rate:  68 PR Interval:  184 QRS Duration:  102 QT Interval:  437 QTC  Calculation: 465 R Axis:   85  Text Interpretation: Sinus rhythm Confirmed by Francesca Fallow ([id number]) on 04/11/2024 5:39:39 PM  Radiology: No results found.   Procedures   Medications Ordered in the ED  sodium chloride  0.9 % bolus 1,000 mL (1,000 mLs Intravenous New Bag/Given 04/11/24 1751)  sodium chloride  0.9 % bolus  1,000 mL (1,000 mLs Intravenous Bolus 04/11/24 2125)                                    Medical Decision Making Amount and/or Complexity of Data Reviewed Labs: ordered.   This patient is a 53 y.o. male  who presents to the ED for concern of hypothermia, alcohol  intoxication.   Differential diagnoses prior to evaluation: The emergent differential diagnosis includes, but is not limited to, acute alcohol  intoxication, hyperthermia, alcohol  withdrawal, DTs, versus other. This is not an exhaustive differential.   Past Medical History / Co-morbidities / Social History: alcohol  dependence, previous subarachnoid hemorrhage, seizure disorder, tobacco abuse, COPD, homelessness  Additional history: Chart reviewed. Pertinent results include: Multiple previous emergency department visits, patient frequently in the emergency department with problems related to alcohol  dependence  Physical Exam: Physical exam performed. The pertinent findings include: Disheveled, foul-smelling, initially hypothermic, rectal temperature 95.3, improved to 97.9 after drying patient off, placing with warm blankets, Bair hugger  Lab Tests/Imaging studies: I personally interpreted labs/imaging and the pertinent results include: CBC with mild anemia, hemoglobin 12.5, CMP with elevated transaminases, AST, ALT, alkaline phosphatase likely secondary to acute alcohol  intoxication, anion gap of 16 likely secondary to acute alcohol  intoxication, initial lactic acid 2.9, improved to 2.4 after fluids, again suspect secondary to acute alcohol  intoxication. I agree with the radiologist interpretation.  Cardiac  monitoring: EKG obtained and interpreted by myself and attending physician which shows: NSR, no acute ST-T changes  He has had some borderline soft blood pressures but overall stable mean arterial pressure.  He was briefly placed on nasal cannula after desaturating while laying in bed sleeping, however I was able to titrate him off nasal cannula without any intervention, clear lung sounds throughout, and stable oxygen saturation on room air after sitting up in bed and awake.   Medications: I ordered medication including fluid bolus for dehydration, intoxication, lactic acidosis.  I have reviewed the patients home medicines and have made adjustments as needed.   Disposition: After consideration of the diagnostic results and the patients response to treatment, I feel that patient with acute alcohol  intoxication and hyperthermia.  No other acute abnormality identified today, stable for discharge, given resources for substance abuse.   emergency department workup does not suggest an emergent condition requiring admission or immediate intervention beyond what has been performed at this time. The plan is: as above. The patient is safe for discharge and has been instructed to return immediately for worsening symptoms, change in symptoms or any other concerns.   Final diagnoses:  Hypothermia, initial encounter  Acute alcoholic intoxication without complication    ED Discharge Orders     None          Rosan Sherlean VEAR DEVONNA 04/11/24 2205    Francesca Elsie CROME, MD 04/11/24 2328

## 2024-04-12 ENCOUNTER — Inpatient Hospital Stay (HOSPITAL_COMMUNITY)
Admission: EM | Admit: 2024-04-12 | Discharge: 2024-04-22 | DRG: 896 | Disposition: A | Discharge status: Home / Self Care | Payer: MEDICAID | Attending: Internal Medicine | Admitting: Internal Medicine

## 2024-04-12 ENCOUNTER — Emergency Department (HOSPITAL_COMMUNITY): Payer: MEDICAID

## 2024-04-12 DIAGNOSIS — F419 Anxiety disorder, unspecified: Secondary | ICD-10-CM | POA: Diagnosis present

## 2024-04-12 DIAGNOSIS — Z87891 Personal history of nicotine dependence: Secondary | ICD-10-CM

## 2024-04-12 DIAGNOSIS — J440 Chronic obstructive pulmonary disease with acute lower respiratory infection: Secondary | ICD-10-CM | POA: Diagnosis not present

## 2024-04-12 DIAGNOSIS — F10229 Alcohol dependence with intoxication, unspecified: Secondary | ICD-10-CM

## 2024-04-12 DIAGNOSIS — J441 Chronic obstructive pulmonary disease with (acute) exacerbation: Secondary | ICD-10-CM | POA: Diagnosis not present

## 2024-04-12 DIAGNOSIS — K760 Fatty (change of) liver, not elsewhere classified: Secondary | ICD-10-CM | POA: Diagnosis present

## 2024-04-12 DIAGNOSIS — D72819 Decreased white blood cell count, unspecified: Secondary | ICD-10-CM | POA: Diagnosis present

## 2024-04-12 DIAGNOSIS — R7401 Elevation of levels of liver transaminase levels: Secondary | ICD-10-CM | POA: Diagnosis present

## 2024-04-12 DIAGNOSIS — W19XXXA Unspecified fall, initial encounter: Secondary | ICD-10-CM | POA: Diagnosis present

## 2024-04-12 DIAGNOSIS — D649 Anemia, unspecified: Secondary | ICD-10-CM | POA: Diagnosis present

## 2024-04-12 DIAGNOSIS — F10220 Alcohol dependence with intoxication, uncomplicated: Principal | ICD-10-CM | POA: Diagnosis present

## 2024-04-12 DIAGNOSIS — S01112A Laceration without foreign body of left eyelid and periocular area, initial encounter: Secondary | ICD-10-CM | POA: Diagnosis present

## 2024-04-12 DIAGNOSIS — Z7951 Long term (current) use of inhaled steroids: Secondary | ICD-10-CM

## 2024-04-12 DIAGNOSIS — Z5900 Homelessness unspecified: Secondary | ICD-10-CM

## 2024-04-12 DIAGNOSIS — J9601 Acute respiratory failure with hypoxia: Secondary | ICD-10-CM | POA: Diagnosis not present

## 2024-04-12 DIAGNOSIS — E876 Hypokalemia: Secondary | ICD-10-CM | POA: Diagnosis present

## 2024-04-12 DIAGNOSIS — R21 Rash and other nonspecific skin eruption: Secondary | ICD-10-CM | POA: Diagnosis not present

## 2024-04-12 DIAGNOSIS — G40909 Epilepsy, unspecified, not intractable, without status epilepticus: Secondary | ICD-10-CM | POA: Diagnosis present

## 2024-04-12 DIAGNOSIS — F32A Depression, unspecified: Secondary | ICD-10-CM | POA: Diagnosis present

## 2024-04-12 DIAGNOSIS — Z8249 Family history of ischemic heart disease and other diseases of the circulatory system: Secondary | ICD-10-CM

## 2024-04-12 DIAGNOSIS — R45851 Suicidal ideations: Secondary | ICD-10-CM | POA: Diagnosis present

## 2024-04-12 DIAGNOSIS — T68XXXA Hypothermia, initial encounter: Secondary | ICD-10-CM | POA: Diagnosis present

## 2024-04-12 DIAGNOSIS — Z79899 Other long term (current) drug therapy: Secondary | ICD-10-CM

## 2024-04-12 DIAGNOSIS — Z1152 Encounter for screening for COVID-19: Secondary | ICD-10-CM

## 2024-04-12 DIAGNOSIS — Y908 Blood alcohol level of 240 mg/100 ml or more: Secondary | ICD-10-CM | POA: Diagnosis present

## 2024-04-12 DIAGNOSIS — Z23 Encounter for immunization: Secondary | ICD-10-CM

## 2024-04-12 DIAGNOSIS — F1010 Alcohol abuse, uncomplicated: Secondary | ICD-10-CM

## 2024-04-12 DIAGNOSIS — G4710 Hypersomnia, unspecified: Secondary | ICD-10-CM | POA: Diagnosis present

## 2024-04-12 DIAGNOSIS — J188 Other pneumonia, unspecified organism: Secondary | ICD-10-CM | POA: Diagnosis present

## 2024-04-12 DIAGNOSIS — F10232 Alcohol dependence with withdrawal with perceptual disturbance: Secondary | ICD-10-CM | POA: Diagnosis not present

## 2024-04-12 DIAGNOSIS — Z87820 Personal history of traumatic brain injury: Secondary | ICD-10-CM

## 2024-04-12 LAB — COMPREHENSIVE METABOLIC PANEL WITH GFR
ALT: 71 U/L — ABNORMAL HIGH (ref 0–44)
AST: 116 U/L — ABNORMAL HIGH (ref 15–41)
Albumin: 4 g/dL (ref 3.5–5.0)
Alkaline Phosphatase: 121 U/L (ref 38–126)
Anion gap: 15 (ref 5–15)
BUN: 9 mg/dL (ref 6–20)
CO2: 24 mmol/L (ref 22–32)
Calcium: 9.4 mg/dL (ref 8.9–10.3)
Chloride: 102 mmol/L (ref 98–111)
Creatinine, Ser: 0.51 mg/dL — ABNORMAL LOW (ref 0.61–1.24)
GFR, Estimated: >60 mL/min (ref >60)
Glucose, Bld: 132 mg/dL — ABNORMAL HIGH (ref 70–99)
Potassium: 3.4 mmol/L — ABNORMAL LOW (ref 3.5–5.1)
Sodium: 141 mmol/L (ref 135–145)
Total Bilirubin: 0.4 mg/dL (ref 0.0–1.2)
Total Protein: 7.1 g/dL (ref 6.5–8.1)

## 2024-04-12 LAB — CBC WITH DIFFERENTIAL/PLATELET
Abs Immature Granulocytes: 0.02 K/uL (ref 0.00–0.07)
Basophils Absolute: 0.1 K/uL (ref 0.0–0.1)
Basophils Relative: 1 %
Eosinophils Absolute: 0.1 K/uL (ref 0.0–0.5)
Eosinophils Relative: 2 %
HCT: 34.8 % — ABNORMAL LOW (ref 39.0–52.0)
Hemoglobin: 11.4 g/dL — ABNORMAL LOW (ref 13.0–17.0)
Immature Granulocytes: 0 %
Lymphocytes Relative: 18 %
Lymphs Abs: 1.2 K/uL (ref 0.7–4.0)
MCH: 31.3 pg (ref 26.0–34.0)
MCHC: 32.8 g/dL (ref 30.0–36.0)
MCV: 95.6 fL (ref 80.0–100.0)
Monocytes Absolute: 0.2 K/uL (ref 0.1–1.0)
Monocytes Relative: 3 %
Neutro Abs: 5.2 K/uL (ref 1.7–7.7)
Neutrophils Relative %: 76 %
Platelets: 318 K/uL (ref 150–400)
RBC: 3.64 MIL/uL — ABNORMAL LOW (ref 4.22–5.81)
RDW: 16.1 % — ABNORMAL HIGH (ref 11.5–15.5)
WBC: 6.8 K/uL (ref 4.0–10.5)
nRBC: 0 % (ref 0.0–0.2)

## 2024-04-12 LAB — ETHANOL: Alcohol, Ethyl (B): >450 mg/dL (ref <15)

## 2024-04-12 LAB — RESP PANEL BY RT-PCR (RSV, FLU A&B, COVID)  RVPGX2
Influenza A by PCR: NEGATIVE
Influenza B by PCR: NEGATIVE
Resp Syncytial Virus by PCR: NEGATIVE
SARS Coronavirus 2 by RT PCR: NEGATIVE

## 2024-04-12 MED ORDER — LACTATED RINGERS IV BOLUS
1000.0000 mL | Freq: Once | INTRAVENOUS | Status: AC
  Administered 2024-04-12: 1000 mL via INTRAVENOUS

## 2024-04-12 MED ORDER — TETANUS-DIPHTHERIA TOXOIDS TD 5-2 LF/0.5ML IM SUSP
0.5000 mL | Freq: Once | INTRAMUSCULAR | Status: AC
  Administered 2024-04-12: 0.5 mL via INTRAMUSCULAR
  Filled 2024-04-12: qty 0.5

## 2024-04-12 MED ORDER — LACTATED RINGERS IV SOLN: INTRAVENOUS | Status: DC

## 2024-04-12 MED ORDER — LIDOCAINE HCL (PF) 1 % IJ SOLN
5.0000 mL | Freq: Once | INTRAMUSCULAR | Status: AC
  Administered 2024-04-12: 5 mL
  Filled 2024-04-12: qty 30

## 2024-04-12 NOTE — ED Notes (Signed)
 Patient has lac to left eyebrow, no active bleeding, PERRLA.

## 2024-04-12 NOTE — ED Notes (Signed)
 Patient placed in c collar, seizure precautions maintained MD notified.
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
## 2024-04-12 NOTE — ED Provider Notes (Signed)
 [place] EMERGENCY DEPARTMENT AT [facility] Provider Note   CSN: [id number] Arrival date & time: 04/12/24  8196     Patient presents with: Alcohol  Intoxication and Cough   Gary Mclaughlin is a 53 y.o. male.   53 year old male presents via EMS after being found outside.  Patient has had multiple visits to the alcohol  abuse.  Also had a recent visit for being hypothermic.  Patient mid to alcohol  use today.  He had cough.  He had for some vague SI to paramedics.  They treated his shortness of breath with albuterol  Solu-Medrol  and Atrovent and gave patient fluids.  CBG was 215.  Patient presents with O2 sat of asymptomatic room air.  Was placed on 2 L.       Prior to Admission medications   Medication Sig Start Date End Date Taking? Authorizing Provider  albuterol  (VENTOLIN  HFA) 108 (90 Base) MCG/ACT inhaler Inhale 2 puffs into the lungs every 6 (six) hours as needed for wheezing or shortness of breath. 02/26/24   Vicci Burnard SAUNDERS, PA-C  budesonide -formoterol  (SYMBICORT ) 160-4.5 MCG/ACT inhaler Inhale 2 puffs into the lungs 2 (two) times daily. 02/26/24   Vicci Burnard SAUNDERS, PA-C  cetirizine  (ZYRTEC  ALLERGY) 10 MG tablet Take 1 tablet (10 mg total) by mouth daily. 02/26/24   Vicci Burnard SAUNDERS, PA-C  chlordiazePOXIDE  (LIBRIUM ) 10 MG capsule Please take by mouth: 1 capsule (10mg  total) 4 times daily x 3 days, then 1 capsule (10mg  total) 3 times daily x 3 days, then 1 capsule (10mg  total) 2 times daily x 3 days, then 1 capsule (10mg  total) once daily for 3 days then stop. 03/08/24   Elgergawy, Brayton RAMAN, MD  escitalopram  (LEXAPRO ) 10 MG tablet Take 1 tablet (10 mg total) by mouth daily. 02/26/24   Vicci Burnard SAUNDERS, PA-C  folic acid  (FOLVITE ) 1 MG tablet Take 1 tablet (1 mg total) by mouth daily. 03/09/24   Elgergawy, Brayton RAMAN, MD  gabapentin  (NEURONTIN ) 300 MG capsule Take 1 capsule (300 mg total) by mouth 3 (three) times daily. Patient taking differently: Take 300 mg by mouth daily.  02/26/24   Vicci Burnard SAUNDERS, PA-C  guaiFENesin  (MUCINEX ) 600 MG 12 hr tablet Take 1 tablet (600 mg total) by mouth 2 (two) times daily as needed for to loosen phlegm. Patient taking differently: Take 600 mg by mouth daily. 02/26/24   Vicci Burnard SAUNDERS, PA-C  hydrOXYzine  (ATARAX ) 25 MG tablet Take 1 tablet (25 mg total) by mouth 3 (three) times daily as needed for anxiety. Patient taking differently: Take 25 mg by mouth daily. 02/26/24   Vicci Burnard SAUNDERS, PA-C  lidocaine  (LIDODERM ) 5 % Place 1 patch onto the skin daily. Remove & Discard patch within 12 hours or as directed by MD Patient not taking: Reported on 03/05/2024 02/27/24   Vicci Burnard SAUNDERS, PA-C  methocarbamol  (ROBAXIN ) 500 MG tablet Take 2 tablets (1,000 mg total) by mouth every 8 (eight) hours as needed for muscle spasms. Patient taking differently: Take 1,000 mg by mouth daily. 02/26/24   Vicci Burnard SAUNDERS, PA-C  nicotine  (NICODERM CQ  - DOSED IN MG/24 HOURS) 14 mg/24hr patch Place 1 patch (14 mg total) onto the skin daily. Patient not taking: Reported on 03/05/2024 02/27/24   Vicci Burnard SAUNDERS, PA-C  oxyCODONE  (OXY IR/ROXICODONE ) 5 MG immediate release tablet Take 1 tablet (5 mg total) by mouth every 6 (six) hours as needed for moderate pain (pain score 4-6). 03/08/24   Elgergawy, Brayton RAMAN, MD  thiamine  (VITAMIN B-1)  100 MG tablet Take 1 tablet (100 mg total) by mouth daily. 03/09/24   Elgergawy, Brayton RAMAN, MD    Allergies: Patient has no known allergies.    Review of Systems  Unable to perform ROS: Acuity of condition    Updated Vital Signs BP 100/67   Pulse 69   Temp (!) 97.5 F (36.4 C) (Oral)   Resp 16   Ht 1.727 m (5' 8)   Wt 68 kg   SpO2 99%   BMI 22.79 kg/m   Physical Exam Vitals and nursing note reviewed.  Constitutional:      General: He is not in acute distress.    Appearance: Normal appearance. He is well-developed. He is not toxic-appearing.  HENT:     Head: Normocephalic and atraumatic.   Eyes:     General:  Lids are normal.     Conjunctiva/sclera: Conjunctivae normal.     Pupils: Pupils are equal, round, and reactive to light.  Neck:     Thyroid : No thyroid  mass.     Trachea: No tracheal deviation.  Cardiovascular:     Rate and Rhythm: Normal rate and regular rhythm.     Heart sounds: Normal heart sounds. No murmur heard.    No gallop.  Pulmonary:     Effort: Pulmonary effort is normal. Prolonged expiration present. No respiratory distress.     Breath sounds: No stridor. Examination of the right-upper field reveals decreased breath sounds. Examination of the left-upper field reveals decreased breath sounds. Decreased breath sounds present. No wheezing, rhonchi or rales.  Abdominal:     General: There is no distension.     Palpations: Abdomen is soft.     Tenderness: There is no abdominal tenderness. There is no rebound.  Musculoskeletal:        General: No tenderness. Normal range of motion.     Cervical back: Normal range of motion and neck supple.  Skin:    General: Skin is warm and dry.     Findings: No abrasion or rash.  Neurological:     Mental Status: He is lethargic, disoriented and confused.     GCS: GCS eye subscore is 4. GCS verbal subscore is 5. GCS motor subscore is 6.     Cranial Nerves: No cranial nerve deficit.     Sensory: No sensory deficit.     Motor: Motor function is intact.     Comments: Withdraws to pain in all 4 extremities  Psychiatric:        Attention and Perception: He is inattentive.     (all labs ordered are listed, but only abnormal results are displayed) Labs Reviewed  RESP PANEL BY RT-PCR (RSV, FLU A&B, COVID)  [id number]  URINE DRUG SCREEN  CBC WITH DIFFERENTIAL/PLATELET  COMPREHENSIVE METABOLIC PANEL WITH GFR  ETHANOL    EKG: EKG Interpretation Date/Time:  Tuesday April 12 2024 19:30:03 EST Ventricular Rate:  64 PR Interval:  194 QRS Duration:  107 QT Interval:  434 QTC Calculation: 448 R Axis:   104  Text Interpretation: Sinus  rhythm Right axis deviation ST elev, probable normal early repol pattern No significant change since last tracing Confirmed by Dasie Faden ([id number]) on 04/12/2024 7:47:12 PM  Radiology: No results found.   Procedures   Medications Ordered in the ED  lactated ringers  bolus 1,000 mL (has no administration in time range)  lactated ringers  infusion (has no administration in time range)  tetanus & diphtheria toxoids (adult) (TENIVAC ) injection 0.5 mL (has no administration  in time range)                                    Medical Decision Making Amount and/or Complexity of Data Reviewed Labs: ordered. Radiology: ordered. ECG/medicine tests: ordered.  Risk Prescription drug management.   Patient is EKG shows sinus rhythm.  Chest x-ray without acute findings.  Patient had a CT of his head and cervical spine due to his laceration on his left eyebrow.  Those films do not show any evidence of intracranial or cervical spine fracture.  Alcohol  level is over 450.  Patient monitored closely here in evidence of any compromise.  Patient will require admission  CRITICAL CARE Performed by: Curtistine ONEIDA Dawn Total critical care time: 45 minutes Critical care time was exclusive of separately billable procedures and treating other patients. Critical care was necessary to treat or prevent imminent or life-threatening deterioration. Critical care was time spent personally by me on the following activities: development of treatment plan with patient and/or surrogate as well as nursing, discussions with consultants, evaluation of patient's response to treatment, examination of patient, obtaining history from patient or surrogate, ordering and performing treatments and interventions, ordering and review of laboratory studies, ordering and review of radiographic studies, pulse oximetry and re-evaluation of patient's condition.      Final diagnoses:  None    ED Discharge Orders     None           Dawn Curtistine, MD 04/12/24 2143

## 2024-04-12 NOTE — ED Provider Notes (Signed)
° ° °  Procedures  .Laceration Repair  Date/Time: 04/12/2024 9:02 PM  Performed by: Myriam Fonda RAMAN, PA-C Authorized by: Myriam Fonda RAMAN, PA-C   Universal protocol:    Relevant documents present and verified: yes     Test results available: yes     Imaging studies available: yes     Required blood products, implants, devices, and special equipment available: yes     Site/side marked: yes     Immediately prior to procedure, a time out was called: yes     Patient identity confirmed:  Arm band Anesthesia:    Anesthesia method:  Local infiltration   Local anesthetic:  Lidocaine  1% w/o epi Laceration details:    Location:  Face   Face location:  L eyebrow   Length (cm):  2   Depth (mm):  4 Pre-procedure details:    Preparation:  Patient was prepped and draped in usual sterile fashion and imaging obtained to evaluate for foreign bodies Exploration:    Hemostasis achieved with:  Direct pressure   Contaminated: no   Treatment:    Area cleansed with:  Saline   Amount of cleaning:  Standard   Irrigation solution:  Sterile saline   Irrigation volume:  250   Irrigation method:  Syringe Skin repair:    Repair method:  Sutures   Suture size:  5-0   Suture material:  Prolene   Suture technique:  Simple interrupted   Number of sutures:  5 Approximation:    Approximation:  Close Repair type:    Repair type:  Simple Post-procedure details:    Dressing:  Open (no dressing)   Procedure completion:  [place] Myriam Fonda RAMAN, PA-C 04/12/24 2105    Dasie Faden, MD 04/17/24 1737

## 2024-04-13 ENCOUNTER — Encounter (HOSPITAL_COMMUNITY): Payer: Self-pay | Admitting: Internal Medicine

## 2024-04-13 ENCOUNTER — Other Ambulatory Visit: Payer: Self-pay

## 2024-04-13 ENCOUNTER — Inpatient Hospital Stay (HOSPITAL_COMMUNITY): Payer: MEDICAID

## 2024-04-13 DIAGNOSIS — G40909 Epilepsy, unspecified, not intractable, without status epilepticus: Secondary | ICD-10-CM | POA: Diagnosis present

## 2024-04-13 DIAGNOSIS — W19XXXA Unspecified fall, initial encounter: Secondary | ICD-10-CM | POA: Diagnosis present

## 2024-04-13 DIAGNOSIS — J440 Chronic obstructive pulmonary disease with acute lower respiratory infection: Secondary | ICD-10-CM | POA: Diagnosis not present

## 2024-04-13 DIAGNOSIS — Z7951 Long term (current) use of inhaled steroids: Secondary | ICD-10-CM | POA: Diagnosis not present

## 2024-04-13 DIAGNOSIS — F1010 Alcohol abuse, uncomplicated: Secondary | ICD-10-CM

## 2024-04-13 DIAGNOSIS — R45851 Suicidal ideations: Secondary | ICD-10-CM | POA: Diagnosis present

## 2024-04-13 DIAGNOSIS — F10229 Alcohol dependence with intoxication, unspecified: Secondary | ICD-10-CM | POA: Diagnosis present

## 2024-04-13 DIAGNOSIS — Z87820 Personal history of traumatic brain injury: Secondary | ICD-10-CM | POA: Diagnosis not present

## 2024-04-13 DIAGNOSIS — Z87891 Personal history of nicotine dependence: Secondary | ICD-10-CM | POA: Diagnosis not present

## 2024-04-13 DIAGNOSIS — F10220 Alcohol dependence with intoxication, uncomplicated: Secondary | ICD-10-CM | POA: Diagnosis present

## 2024-04-13 DIAGNOSIS — J188 Other pneumonia, unspecified organism: Secondary | ICD-10-CM | POA: Diagnosis present

## 2024-04-13 DIAGNOSIS — F32A Depression, unspecified: Secondary | ICD-10-CM | POA: Diagnosis present

## 2024-04-13 DIAGNOSIS — T68XXXA Hypothermia, initial encounter: Secondary | ICD-10-CM | POA: Diagnosis present

## 2024-04-13 DIAGNOSIS — D72819 Decreased white blood cell count, unspecified: Secondary | ICD-10-CM | POA: Diagnosis present

## 2024-04-13 DIAGNOSIS — E876 Hypokalemia: Secondary | ICD-10-CM | POA: Diagnosis present

## 2024-04-13 DIAGNOSIS — Y908 Blood alcohol level of 240 mg/100 ml or more: Secondary | ICD-10-CM | POA: Diagnosis present

## 2024-04-13 DIAGNOSIS — Z23 Encounter for immunization: Secondary | ICD-10-CM | POA: Diagnosis not present

## 2024-04-13 DIAGNOSIS — J9601 Acute respiratory failure with hypoxia: Secondary | ICD-10-CM | POA: Diagnosis not present

## 2024-04-13 DIAGNOSIS — Z1152 Encounter for screening for COVID-19: Secondary | ICD-10-CM | POA: Diagnosis not present

## 2024-04-13 DIAGNOSIS — J441 Chronic obstructive pulmonary disease with (acute) exacerbation: Secondary | ICD-10-CM | POA: Diagnosis not present

## 2024-04-13 DIAGNOSIS — Z79899 Other long term (current) drug therapy: Secondary | ICD-10-CM | POA: Diagnosis not present

## 2024-04-13 DIAGNOSIS — F10232 Alcohol dependence with withdrawal with perceptual disturbance: Secondary | ICD-10-CM | POA: Diagnosis not present

## 2024-04-13 DIAGNOSIS — Z8249 Family history of ischemic heart disease and other diseases of the circulatory system: Secondary | ICD-10-CM | POA: Diagnosis not present

## 2024-04-13 DIAGNOSIS — Z5900 Homelessness unspecified: Secondary | ICD-10-CM | POA: Diagnosis not present

## 2024-04-13 DIAGNOSIS — S01112A Laceration without foreign body of left eyelid and periocular area, initial encounter: Secondary | ICD-10-CM | POA: Diagnosis present

## 2024-04-13 DIAGNOSIS — D649 Anemia, unspecified: Secondary | ICD-10-CM | POA: Diagnosis present

## 2024-04-13 DIAGNOSIS — K760 Fatty (change of) liver, not elsewhere classified: Secondary | ICD-10-CM | POA: Diagnosis present

## 2024-04-13 DIAGNOSIS — G4710 Hypersomnia, unspecified: Secondary | ICD-10-CM | POA: Diagnosis present

## 2024-04-13 LAB — CBC
HCT: 35.6 % — ABNORMAL LOW (ref 39.0–52.0)
Hemoglobin: 11.4 g/dL — ABNORMAL LOW (ref 13.0–17.0)
MCH: 30.6 pg (ref 26.0–34.0)
MCHC: 32 g/dL (ref 30.0–36.0)
MCV: 95.7 fL (ref 80.0–100.0)
Platelets: 336 K/uL (ref 150–400)
RBC: 3.72 MIL/uL — ABNORMAL LOW (ref 4.22–5.81)
RDW: 16.2 % — ABNORMAL HIGH (ref 11.5–15.5)
WBC: 1.9 K/uL — ABNORMAL LOW (ref 4.0–10.5)
nRBC: 0 % (ref 0.0–0.2)

## 2024-04-13 LAB — URINE DRUG SCREEN
Amphetamines: NEGATIVE
Barbiturates: NEGATIVE
Benzodiazepines: POSITIVE — AB
Cocaine: NEGATIVE
Fentanyl: NEGATIVE
Methadone Scn, Ur: NEGATIVE
Opiates: NEGATIVE
Tetrahydrocannabinol: NEGATIVE

## 2024-04-13 LAB — C DIFFICILE QUICK SCREEN W PCR REFLEX
C Diff antigen: NEGATIVE
C Diff interpretation: NOT DETECTED
C Diff toxin: NEGATIVE

## 2024-04-13 LAB — COMPREHENSIVE METABOLIC PANEL WITH GFR
ALT: 67 U/L — ABNORMAL HIGH (ref 0–44)
AST: 89 U/L — ABNORMAL HIGH (ref 15–41)
Albumin: 3.9 g/dL (ref 3.5–5.0)
Alkaline Phosphatase: 121 U/L (ref 38–126)
Anion gap: 13 (ref 5–15)
BUN: 9 mg/dL (ref 6–20)
CO2: 26 mmol/L (ref 22–32)
Calcium: 9.2 mg/dL (ref 8.9–10.3)
Chloride: 107 mmol/L (ref 98–111)
Creatinine, Ser: 0.55 mg/dL — ABNORMAL LOW (ref 0.61–1.24)
GFR, Estimated: >60 mL/min (ref >60)
Glucose, Bld: 153 mg/dL — ABNORMAL HIGH (ref 70–99)
Potassium: 4.1 mmol/L (ref 3.5–5.1)
Sodium: 145 mmol/L (ref 135–145)
Total Bilirubin: <0.2 mg/dL (ref 0.0–1.2)
Total Protein: 6.8 g/dL (ref 6.5–8.1)

## 2024-04-13 LAB — MAGNESIUM: Magnesium: 1.9 mg/dL (ref 1.7–2.4)

## 2024-04-13 LAB — MRSA NEXT GEN BY PCR, NASAL: MRSA by PCR Next Gen: NOT DETECTED

## 2024-04-13 MED ORDER — LORAZEPAM 1 MG PO TABS: 1.0000 mg | ORAL_TABLET | ORAL | Status: DC | PRN

## 2024-04-13 MED ORDER — CHLORHEXIDINE GLUCONATE CLOTH 2 % EX PADS
6.0000 | MEDICATED_PAD | Freq: Every day | CUTANEOUS | Status: DC
  Administered 2024-04-13 – 2024-04-21 (×9): 6 via TOPICAL

## 2024-04-13 MED ORDER — SODIUM CHLORIDE 0.9% FLUSH
3.0000 mL | Freq: Two times a day (BID) | INTRAVENOUS | Status: DC
  Administered 2024-04-13 – 2024-04-22 (×19): 3 mL via INTRAVENOUS

## 2024-04-13 MED ORDER — LORAZEPAM 1 MG PO TABS
1.0000 mg | ORAL_TABLET | ORAL | Status: DC | PRN
  Administered 2024-04-13 – 2024-04-16 (×7): 2 mg via ORAL
  Administered 2024-04-17 – 2024-04-18 (×3): 1 mg via ORAL
  Administered 2024-04-19 – 2024-04-20 (×2): 2 mg via ORAL
  Administered 2024-04-21: 22:00:00 1 mg via ORAL
  Filled 2024-04-13 (×2): qty 1
  Filled 2024-04-13 (×6): qty 2
  Filled 2024-04-13: qty 1
  Filled 2024-04-13 (×4): qty 2

## 2024-04-13 MED ORDER — PHENOBARBITAL 32.4 MG PO TABS
97.2000 mg | ORAL_TABLET | Freq: Three times a day (TID) | ORAL | Status: AC
  Administered 2024-04-13 – 2024-04-15 (×6): 97.2 mg via ORAL
  Filled 2024-04-13 (×6): qty 3

## 2024-04-13 MED ORDER — GUAIFENESIN ER 600 MG PO TB12
1200.0000 mg | ORAL_TABLET | Freq: Two times a day (BID) | ORAL | Status: DC
  Administered 2024-04-13 – 2024-04-22 (×19): 1200 mg via ORAL
  Filled 2024-04-13 (×19): qty 2

## 2024-04-13 MED ORDER — LORAZEPAM 2 MG/ML IJ SOLN
0.0000 mg | INTRAMUSCULAR | Status: DC
  Administered 2024-04-13: 1 mg via INTRAVENOUS
  Administered 2024-04-13: 2 mg via INTRAVENOUS
  Filled 2024-04-13 (×2): qty 1

## 2024-04-13 MED ORDER — PHENOBARBITAL 64.8 MG PO TABS
64.8000 mg | ORAL_TABLET | Freq: Three times a day (TID) | ORAL | Status: AC
  Administered 2024-04-15 – 2024-04-17 (×6): 64.8 mg via ORAL
  Filled 2024-04-13 (×6): qty 2

## 2024-04-13 MED ORDER — GABAPENTIN 300 MG PO CAPS: 300.0000 mg | ORAL_CAPSULE | Freq: Three times a day (TID) | ORAL | Status: DC

## 2024-04-13 MED ORDER — LORAZEPAM 2 MG/ML IJ SOLN
1.0000 mg | INTRAMUSCULAR | Status: DC | PRN
  Administered 2024-04-13: 2 mg via INTRAVENOUS
  Filled 2024-04-13: qty 1

## 2024-04-13 MED ORDER — SODIUM CHLORIDE 0.9 % IV SOLN: INTRAVENOUS | Status: AC | PRN

## 2024-04-13 MED ORDER — NICOTINE 21 MG/24HR TD PT24
21.0000 mg | MEDICATED_PATCH | Freq: Every day | TRANSDERMAL | Status: DC
  Administered 2024-04-13 – 2024-04-22 (×10): 21 mg via TRANSDERMAL
  Filled 2024-04-13 (×10): qty 1

## 2024-04-13 MED ORDER — THIAMINE MONONITRATE 100 MG PO TABS
100.0000 mg | ORAL_TABLET | Freq: Every day | ORAL | Status: DC
  Administered 2024-04-13 – 2024-04-22 (×10): 100 mg via ORAL
  Filled 2024-04-13 (×10): qty 1

## 2024-04-13 MED ORDER — IPRATROPIUM-ALBUTEROL 0.5-2.5 (3) MG/3ML IN SOLN
3.0000 mL | Freq: Three times a day (TID) | RESPIRATORY_TRACT | Status: DC
  Administered 2024-04-13 – 2024-04-14 (×4): 3 mL via RESPIRATORY_TRACT
  Filled 2024-04-13 (×5): qty 3

## 2024-04-13 MED ORDER — FOLIC ACID 1 MG PO TABS
1.0000 mg | ORAL_TABLET | Freq: Every day | ORAL | Status: DC
  Administered 2024-04-13 – 2024-04-22 (×10): 1 mg via ORAL
  Filled 2024-04-13 (×10): qty 1

## 2024-04-13 MED ORDER — LORAZEPAM 2 MG/ML IJ SOLN: 1.0000 mg | INTRAMUSCULAR | Status: DC | PRN

## 2024-04-13 MED ORDER — ARFORMOTEROL TARTRATE 15 MCG/2ML IN NEBU
15.0000 ug | INHALATION_SOLUTION | Freq: Two times a day (BID) | RESPIRATORY_TRACT | Status: DC
  Administered 2024-04-13 – 2024-04-22 (×19): 15 ug via RESPIRATORY_TRACT
  Filled 2024-04-13 (×18): qty 2

## 2024-04-13 MED ORDER — ENOXAPARIN SODIUM 40 MG/0.4ML IJ SOSY
40.0000 mg | PREFILLED_SYRINGE | INTRAMUSCULAR | Status: DC
  Administered 2024-04-13 – 2024-04-22 (×10): 40 mg via SUBCUTANEOUS
  Filled 2024-04-13 (×10): qty 0.4

## 2024-04-13 MED ORDER — POTASSIUM CHLORIDE 2 MEQ/ML IV SOLN
INTRAVENOUS | Status: DC
  Filled 2024-04-13 (×2): qty 1000

## 2024-04-13 MED ORDER — SODIUM CHLORIDE 0.9 % IV SOLN
3.0000 g | Freq: Four times a day (QID) | INTRAVENOUS | Status: DC
  Administered 2024-04-13 – 2024-04-15 (×7): 3 g via INTRAVENOUS
  Filled 2024-04-13 (×7): qty 8

## 2024-04-13 MED ORDER — GABAPENTIN 100 MG PO CAPS
100.0000 mg | ORAL_CAPSULE | Freq: Three times a day (TID) | ORAL | Status: DC
  Administered 2024-04-13 – 2024-04-22 (×28): 100 mg via ORAL
  Filled 2024-04-13 (×28): qty 1

## 2024-04-13 MED ORDER — ALBUTEROL SULFATE (2.5 MG/3ML) 0.083% IN NEBU: 2.5000 mg | INHALATION_SOLUTION | Freq: Four times a day (QID) | RESPIRATORY_TRACT | Status: DC | PRN

## 2024-04-13 MED ORDER — ORAL CARE MOUTH RINSE: 15.0000 mL | OROMUCOSAL | Status: DC | PRN

## 2024-04-13 MED ORDER — ACETAMINOPHEN 325 MG PO TABS
650.0000 mg | ORAL_TABLET | Freq: Once | ORAL | Status: AC
  Administered 2024-04-13: 650 mg via ORAL
  Filled 2024-04-13: qty 2

## 2024-04-13 MED ORDER — THIAMINE HCL 100 MG/ML IJ SOLN: 100.0000 mg | Freq: Every day | INTRAMUSCULAR | Status: DC

## 2024-04-13 MED ORDER — HYDROCORTISONE 0.5 % EX CREA
TOPICAL_CREAM | CUTANEOUS | Status: DC | PRN
  Administered 2024-04-13: 1 via TOPICAL
  Filled 2024-04-13: qty 28.35

## 2024-04-13 MED ORDER — ONDANSETRON HCL 4 MG/2ML IJ SOLN: 4.0000 mg | Freq: Four times a day (QID) | INTRAMUSCULAR | Status: DC | PRN

## 2024-04-13 MED ORDER — LACTATED RINGERS IV BOLUS
1000.0000 mL | Freq: Once | INTRAVENOUS | Status: AC
  Administered 2024-04-13: 1000 mL via INTRAVENOUS

## 2024-04-13 MED ORDER — LORAZEPAM 2 MG/ML IJ SOLN
1.0000 mg | INTRAMUSCULAR | Status: DC | PRN
  Filled 2024-04-13: qty 1

## 2024-04-13 MED ORDER — PHENOBARBITAL 32.4 MG PO TABS
32.4000 mg | ORAL_TABLET | Freq: Three times a day (TID) | ORAL | Status: AC
  Administered 2024-04-17 – 2024-04-19 (×6): 32.4 mg via ORAL
  Filled 2024-04-13 (×6): qty 1

## 2024-04-13 MED ORDER — SODIUM CHLORIDE 0.9 % IV SOLN
500.0000 mg | INTRAVENOUS | Status: DC
  Administered 2024-04-13 – 2024-04-14 (×2): 500 mg via INTRAVENOUS
  Filled 2024-04-13 (×2): qty 5

## 2024-04-13 MED ORDER — ADULT MULTIVITAMIN W/MINERALS CH
1.0000 | ORAL_TABLET | Freq: Every day | ORAL | Status: DC
  Administered 2024-04-13 – 2024-04-22 (×10): 1 via ORAL
  Filled 2024-04-13 (×10): qty 1

## 2024-04-13 MED ORDER — IOHEXOL 350 MG/ML SOLN
75.0000 mL | Freq: Once | INTRAVENOUS | Status: AC | PRN
  Administered 2024-04-13: 75 mL via INTRAVENOUS

## 2024-04-13 MED ORDER — ONDANSETRON HCL 4 MG PO TABS: 4.0000 mg | ORAL_TABLET | Freq: Four times a day (QID) | ORAL | Status: DC | PRN

## 2024-04-13 MED ORDER — LORAZEPAM 2 MG/ML IJ SOLN: 0.0000 mg | Freq: Three times a day (TID) | INTRAMUSCULAR | Status: DC

## 2024-04-13 MED ORDER — BUDESONIDE 0.25 MG/2ML IN SUSP
0.2500 mg | Freq: Two times a day (BID) | RESPIRATORY_TRACT | Status: DC
  Administered 2024-04-13 – 2024-04-14 (×3): 0.25 mg via RESPIRATORY_TRACT
  Filled 2024-04-13 (×3): qty 2

## 2024-04-13 MED ORDER — LACTATED RINGERS IV BOLUS
500.0000 mL | Freq: Once | INTRAVENOUS | Status: AC
  Administered 2024-04-13: 500 mL via INTRAVENOUS
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
## 2024-04-13 NOTE — Progress Notes (Signed)
°   04/13/24 0215  [place] Suicide Severity Rating Scale  1. In the past month -  Have you wished you were dead or wished you could go to sleep and not wake up? Yes  2. In the past month - Have you actually had any thoughts of killing yourself? Yes  3. In the past month - Have you been thinking about how you might kill yourself? Yes  4. In the past month - Have you had these thoughts and had some intention of acting on them? Yes  5. In the past month - Have you started to work out or worked out the details of how to kill yourself? Do you intend to carry out this plan? No (states no plan, but I can make one)  6. Have you ever done anything, started to do anything, or prepared to do anything to end your life? Yes  7. Was this within the past three months? No  C-SSRS RISK CATEGORY High Risk  Patient location: In-patient Unit   While assessing CIWA score, pt expressed that he was anxious all the time and should just end his life.  When going over the SI risk checklist, This RN asked if pt has a plan, and he stated, no plan, but I can make one.  Pt safety maintained while charge RN and [facility] notified of pt SI.  Belongings stored in unit pt locker at nurses station, room prepped for SI, order placed for safety sitter.  Pt also scored CIWA 12 and medicated per MAR - pt asleep at this time.
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
## 2024-04-13 NOTE — Plan of Care (Signed)

## 2024-04-14 DIAGNOSIS — F10229 Alcohol dependence with intoxication, unspecified: Secondary | ICD-10-CM

## 2024-04-14 MED ORDER — TRAMADOL HCL 50 MG PO TABS
50.0000 mg | ORAL_TABLET | Freq: Four times a day (QID) | ORAL | Status: DC | PRN
  Administered 2024-04-20 – 2024-04-21 (×2): 50 mg via ORAL
  Filled 2024-04-14 (×2): qty 1

## 2024-04-14 MED ORDER — ACETAMINOPHEN 325 MG PO TABS
650.0000 mg | ORAL_TABLET | Freq: Four times a day (QID) | ORAL | Status: DC | PRN
  Administered 2024-04-16: 650 mg via ORAL
  Filled 2024-04-14: qty 2

## 2024-04-14 MED ORDER — IPRATROPIUM-ALBUTEROL 0.5-2.5 (3) MG/3ML IN SOLN
3.0000 mL | Freq: Three times a day (TID) | RESPIRATORY_TRACT | Status: DC
  Administered 2024-04-14 – 2024-04-16 (×6): 3 mL via RESPIRATORY_TRACT
  Filled 2024-04-14 (×5): qty 3

## 2024-04-14 MED ORDER — BUDESONIDE 0.5 MG/2ML IN SUSP
0.5000 mg | Freq: Two times a day (BID) | RESPIRATORY_TRACT | Status: DC
  Administered 2024-04-14 – 2024-04-22 (×16): 0.5 mg via RESPIRATORY_TRACT
  Filled 2024-04-14 (×16): qty 2
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
## 2024-04-14 NOTE — Plan of Care (Signed)

## 2024-04-15 LAB — COMPREHENSIVE METABOLIC PANEL WITH GFR
ALT: 52 U/L — ABNORMAL HIGH (ref 0–44)
AST: 40 U/L (ref 15–41)
Albumin: 3.6 g/dL (ref 3.5–5.0)
Alkaline Phosphatase: 103 U/L (ref 38–126)
Anion gap: 8 (ref 5–15)
BUN: 11 mg/dL (ref 6–20)
CO2: 29 mmol/L (ref 22–32)
Calcium: 10 mg/dL (ref 8.9–10.3)
Chloride: 99 mmol/L (ref 98–111)
Creatinine, Ser: 0.64 mg/dL (ref 0.61–1.24)
GFR, Estimated: >60 mL/min (ref >60)
Glucose, Bld: 115 mg/dL — ABNORMAL HIGH (ref 70–99)
Potassium: 4 mmol/L (ref 3.5–5.1)
Sodium: 136 mmol/L (ref 135–145)
Total Bilirubin: 0.5 mg/dL (ref 0.0–1.2)
Total Protein: 6.5 g/dL (ref 6.5–8.1)

## 2024-04-15 MED ORDER — HYDROCERIN EX CREA
TOPICAL_CREAM | Freq: Two times a day (BID) | CUTANEOUS | Status: DC
  Administered 2024-04-18 – 2024-04-20 (×2): 1 via TOPICAL
  Filled 2024-04-15 (×2): qty 113

## 2024-04-15 MED ORDER — AMOXICILLIN-POT CLAVULANATE 875-125 MG PO TABS
1.0000 | ORAL_TABLET | Freq: Two times a day (BID) | ORAL | Status: DC
  Administered 2024-04-15 – 2024-04-22 (×15): 1 via ORAL
  Filled 2024-04-15 (×15): qty 1

## 2024-04-15 MED ORDER — PREDNISONE 20 MG PO TABS
40.0000 mg | ORAL_TABLET | Freq: Every day | ORAL | Status: AC
  Administered 2024-04-15 – 2024-04-19 (×5): 40 mg via ORAL
  Filled 2024-04-15 (×5): qty 2

## 2024-04-15 MED ORDER — HYDRALAZINE HCL 20 MG/ML IJ SOLN
5.0000 mg | Freq: Four times a day (QID) | INTRAMUSCULAR | Status: DC | PRN
  Administered 2024-04-16: 5 mg via INTRAVENOUS
  Filled 2024-04-15 (×2): qty 1
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
## 2024-04-15 NOTE — Plan of Care (Signed)

## 2024-04-16 DIAGNOSIS — F10229 Alcohol dependence with intoxication, unspecified: Secondary | ICD-10-CM | POA: Diagnosis not present

## 2024-04-16 MED ORDER — POLYETHYLENE GLYCOL 3350 17 G PO PACK
17.0000 g | PACK | Freq: Once | ORAL | Status: AC
  Administered 2024-04-16: 17 g via ORAL
  Filled 2024-04-16: qty 1

## 2024-04-16 MED ORDER — IPRATROPIUM-ALBUTEROL 0.5-2.5 (3) MG/3ML IN SOLN
3.0000 mL | Freq: Two times a day (BID) | RESPIRATORY_TRACT | Status: DC
  Administered 2024-04-16 – 2024-04-22 (×12): 3 mL via RESPIRATORY_TRACT
  Filled 2024-04-16 (×12): qty 3

## 2024-04-16 NOTE — Progress Notes (Signed)
 Pt has arrived to unit via wheelchair by his primary nurse on previous floorGLENWOOD Nida, CHARITY FUNDRAISER. Pt is warm, dry,no visible distress

## 2024-04-16 NOTE — Plan of Care (Signed)
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
## 2024-04-16 NOTE — Plan of Care (Signed)
  Problem: Clinical Measurements: Goal: Ability to maintain clinical measurements within normal limits will improve Outcome: Progressing   Problem: Activity: Goal: Risk for activity intolerance will decrease Outcome: Progressing   Problem: Nutrition: Goal: Adequate nutrition will be maintained Outcome: Progressing   Problem: Coping: Goal: Level of anxiety will decrease Outcome: Progressing   Problem: Pain Managment: Goal: General experience of comfort will improve and/or be controlled Outcome: Progressing   Problem: Safety: Goal: Ability to remain free from injury will improve Outcome: Progressing

## 2024-04-17 DIAGNOSIS — F1010 Alcohol abuse, uncomplicated: Secondary | ICD-10-CM | POA: Diagnosis not present

## 2024-04-17 LAB — COMPREHENSIVE METABOLIC PANEL WITH GFR
ALT: 48 U/L — ABNORMAL HIGH (ref 0–44)
AST: 46 U/L — ABNORMAL HIGH (ref 15–41)
Albumin: 3.7 g/dL (ref 3.5–5.0)
Alkaline Phosphatase: 93 U/L (ref 38–126)
Anion gap: 8 (ref 5–15)
BUN: 21 mg/dL — ABNORMAL HIGH (ref 6–20)
CO2: 23 mmol/L (ref 22–32)
Calcium: 10 mg/dL (ref 8.9–10.3)
Chloride: 103 mmol/L (ref 98–111)
Creatinine, Ser: 0.6 mg/dL — ABNORMAL LOW (ref 0.61–1.24)
GFR, Estimated: >60 mL/min (ref >60)
Glucose, Bld: 99 mg/dL (ref 70–99)
Potassium: 4.2 mmol/L (ref 3.5–5.1)
Sodium: 134 mmol/L — ABNORMAL LOW (ref 135–145)
Total Bilirubin: <0.2 mg/dL (ref 0.0–1.2)
Total Protein: 6.7 g/dL (ref 6.5–8.1)

## 2024-04-17 LAB — CBC
HCT: 35.4 % — ABNORMAL LOW (ref 39.0–52.0)
Hemoglobin: 11.5 g/dL — ABNORMAL LOW (ref 13.0–17.0)
MCH: 31.3 pg (ref 26.0–34.0)
MCHC: 32.5 g/dL (ref 30.0–36.0)
MCV: 96.5 fL (ref 80.0–100.0)
Platelets: 266 K/uL (ref 150–400)
RBC: 3.67 MIL/uL — ABNORMAL LOW (ref 4.22–5.81)
RDW: 16.1 % — ABNORMAL HIGH (ref 11.5–15.5)
WBC: 7.9 K/uL (ref 4.0–10.5)
nRBC: 0 % (ref 0.0–0.2)

## 2024-04-17 NOTE — Plan of Care (Signed)
  Problem: Education: Goal: Knowledge of General Education information will improve Description: Including pain rating scale, medication(s)/side effects and non-pharmacologic comfort measures Outcome: Progressing   Problem: Health Behavior/Discharge Planning: Goal: Ability to manage health-related needs will improve Outcome: Progressing   Problem: Nutrition: Goal: Adequate nutrition will be maintained Outcome: Progressing   Problem: Pain Managment: Goal: General experience of comfort will improve and/or be controlled Outcome: Progressing
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
## 2024-04-17 NOTE — Plan of Care (Signed)

## 2024-04-18 DIAGNOSIS — J9601 Acute respiratory failure with hypoxia: Secondary | ICD-10-CM

## 2024-04-18 MED ORDER — AMOXICILLIN-POT CLAVULANATE 875-125 MG PO TABS: 1.0000 | ORAL_TABLET | Freq: Two times a day (BID) | ORAL | 0 refills | Status: DC

## 2024-04-18 MED ORDER — ALBUTEROL SULFATE HFA 108 (90 BASE) MCG/ACT IN AERS: 2.0000 | INHALATION_SPRAY | Freq: Four times a day (QID) | RESPIRATORY_TRACT | 0 refills | Status: AC | PRN

## 2024-04-18 MED ORDER — PREDNISONE 20 MG PO TABS: 40.0000 mg | ORAL_TABLET | Freq: Every day | ORAL | 0 refills | Status: DC

## 2024-04-18 MED ORDER — CETIRIZINE HCL 10 MG PO TABS: 10.0000 mg | ORAL_TABLET | Freq: Every day | ORAL | 1 refills | Status: AC

## 2024-04-18 MED ORDER — GUAIFENESIN ER 600 MG PO TB12: 1200.0000 mg | ORAL_TABLET | Freq: Two times a day (BID) | ORAL | 0 refills | Status: DC

## 2024-04-18 MED ORDER — BUDESONIDE-FORMOTEROL FUMARATE 160-4.5 MCG/ACT IN AERO: 2.0000 | INHALATION_SPRAY | Freq: Two times a day (BID) | RESPIRATORY_TRACT | 0 refills | Status: DC

## 2024-04-18 MED ORDER — POLYETHYLENE GLYCOL 3350 17 G PO PACK: 17.0000 g | PACK | Freq: Two times a day (BID) | ORAL | 0 refills | Status: DC

## 2024-04-18 MED ORDER — ADULT MULTIVITAMIN W/MINERALS CH: 1.0000 | ORAL_TABLET | Freq: Every day | ORAL | 0 refills | Status: DC

## 2024-04-18 MED ORDER — FOLIC ACID 1 MG PO TABS: 1.0000 mg | ORAL_TABLET | Freq: Every day | ORAL | 0 refills | Status: DC

## 2024-04-18 MED ORDER — VITAMIN B-1 100 MG PO TABS: 100.0000 mg | ORAL_TABLET | Freq: Every day | ORAL | 0 refills | Status: DC

## 2024-04-18 MED ORDER — HYDROCORTISONE 0.5 % EX CREA: TOPICAL_CREAM | CUTANEOUS | 0 refills | Status: DC | PRN

## 2024-04-18 MED ORDER — NICOTINE 14 MG/24HR TD PT24: 14.0000 mg | MEDICATED_PATCH | Freq: Every day | TRANSDERMAL | 0 refills | Status: DC

## 2024-04-18 MED ORDER — SENNOSIDES-DOCUSATE SODIUM 8.6-50 MG PO TABS
1.0000 | ORAL_TABLET | Freq: Two times a day (BID) | ORAL | Status: DC
  Administered 2024-04-18 – 2024-04-22 (×9): 1 via ORAL
  Filled 2024-04-18 (×9): qty 1

## 2024-04-18 MED ORDER — POLYETHYLENE GLYCOL 3350 17 G PO PACK
17.0000 g | PACK | Freq: Two times a day (BID) | ORAL | Status: DC
  Administered 2024-04-18 – 2024-04-22 (×9): 17 g via ORAL
  Filled 2024-04-18 (×9): qty 1

## 2024-04-18 MED ORDER — GABAPENTIN 100 MG PO CAPS: 100.0000 mg | ORAL_CAPSULE | Freq: Three times a day (TID) | ORAL | 0 refills | Status: DC
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
## 2024-04-18 NOTE — Plan of Care (Signed)
   Problem: Education: Goal: Knowledge of General Education information will improve Description Including pain rating scale, medication(s)/side effects and non-pharmacologic comfort measures Outcome: Progressing
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
## 2024-04-18 NOTE — TOC Progression Note (Addendum)
 Transition of Care [facility]) - Progression Note   Patient Details  Name: Gary Mclaughlin MRN: [id number] Date of Birth: Aug 23, 1970  Transition of Care [facility]) CM/SW Contact  Duwaine GORMAN Aran, LCSW Phone Number: 04/18/2024, 10:43 AM  Clinical Narrative: CSW spoke with [facility] admissions and was informed the patient will need to call [phone] to complete his screening. CSW provided patient with number to call.  AddendumBETHA [facility] declined referral.  CSW followed up with Jacqueline at [facility] to see if patient could be admitted there. 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 In order for patient to be referred for FBU, patient would need to have detox at the facility and would then be referred for treatment at another facility.   Expected Discharge Plan:  ([facility]idential [facility] Center) Barriers to Discharge: Continued Medical Work up  Expected Discharge Plan and Services In-house Referral: NA Discharge Planning Services: CM Consult Post Acute Care Choice:  (Residential SU Treatment) Living arrangements for the past 2 months: Homeless Expected Discharge Date: 04/18/24               DME Arranged: N/A DME Agency: NA HH Arranged: NA HH Agency: NA  Social Drivers of Health (SDOH) Interventions SDOH Screenings   Food Insecurity: Food Insecurity Present (04/13/2024)  Housing: High Risk (04/13/2024)  Transportation Needs: Unmet Transportation Needs (04/13/2024)  Utilities: At Risk (04/13/2024)  Alcohol  Screen: High Risk (04/10/2023)  Depression (PHQ2-9): Medium Risk (04/14/2023)  Tobacco Use: Medium Risk (04/13/2024)   Readmission Risk Interventions    04/14/2024    2:04 PM  Readmission Risk Prevention Plan  Transportation Screening Complete  Medication Review (RN Care Manager) Complete  PCP or Specialist appointment within 3-5 days of discharge Complete  HRI or Home  Care Consult Complete  SW Recovery Care/Counseling Consult Complete  Palliative Care Screening Not Applicable  Skilled Nursing Facility Not Applicable

## 2024-04-18 NOTE — Progress Notes (Signed)
 Per child psychotherapist we are waiting to hear back from [facility] to confirm pt acceptance, I asked pt has he spoken with [facility] and he said no

## 2024-04-18 NOTE — Progress Notes (Signed)
 Pt stated [facility] has no beds until next week I informed Gerard Aran social worker

## 2024-04-18 NOTE — Progress Notes (Signed)
 Pt has some c/o constipation, last BM was yesterday and per pt it was small will notify doctor of pt concern

## 2024-04-19 DIAGNOSIS — J9601 Acute respiratory failure with hypoxia: Secondary | ICD-10-CM | POA: Diagnosis not present

## 2024-04-19 MED ORDER — TRAZODONE HCL 50 MG PO TABS
50.0000 mg | ORAL_TABLET | Freq: Every evening | ORAL | Status: DC | PRN
  Administered 2024-04-19 – 2024-04-21 (×3): 50 mg via ORAL
  Filled 2024-04-19 (×3): qty 1
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
## 2024-04-19 NOTE — Plan of Care (Signed)
   Problem: Health Behavior/Discharge Planning: Goal: Ability to manage health-related needs will improve Outcome: Progressing

## 2024-04-20 DIAGNOSIS — J9601 Acute respiratory failure with hypoxia: Secondary | ICD-10-CM | POA: Diagnosis not present

## 2024-04-20 MED ORDER — MELATONIN 5 MG PO TABS
5.0000 mg | ORAL_TABLET | Freq: Once | ORAL | Status: AC
  Administered 2024-04-20: 5 mg via ORAL
  Filled 2024-04-20: qty 1

## 2024-04-20 MED ORDER — LACTULOSE 10 GM/15ML PO SOLN
30.0000 g | Freq: Once | ORAL | Status: AC
  Administered 2024-04-20: 10:00:00 30 g via ORAL
  Filled 2024-04-20: qty 45

## 2024-04-20 NOTE — TOC Progression Note (Signed)
 Transition of Care [facility]) - Progression Note    Patient Details  Name: Gary Mclaughlin MRN: [id number] Date of Birth: [birth year]  Transition of Care [facility]) CM/SW Contact  Alfonse JONELLE Rex, RN Phone Number: 04/20/2024, 2:48 PM  Clinical Narrative:   TOC Consult for follow up placement. NCM called to [facility] , per admissions, no male beds available until 05/02/2024. NCM called to [facility] in [place], [place], spoke with [place] in admissions, NCM requested re-review for admission, [facility] requested Residential Form bed completed with updated clinical information and faxed to [phone]. [facility] Form completed and faxed to number above with updated clinical, await determination.     Expected Discharge Plan:  ([facility]) Barriers to Discharge: Continued Medical Work up               Expected Discharge Plan and Services In-house Referral: NA Discharge Planning Services: CM Consult Post Acute Care Choice:  (Residential SU Treatment) Living arrangements for the past 2 months: Homeless Expected Discharge Date: 04/20/24               DME Arranged: N/A DME Agency: NA       HH Arranged: NA HH Agency: NA         Social Drivers of Health (SDOH) Interventions SDOH Screenings   Food Insecurity: Food Insecurity Present (04/13/2024)  Housing: High Risk (04/13/2024)  Transportation Needs: Unmet Transportation Needs (04/13/2024)  Utilities: At Risk (04/13/2024)  Alcohol  Screen: High Risk (04/10/2023)  Depression (PHQ2-9): Medium Risk (04/14/2023)  Tobacco Use: Medium Risk (04/13/2024)    Readmission Risk Interventions    04/14/2024    2:04 PM  Readmission Risk Prevention Plan  Transportation Screening Complete  Medication Review (RN Care Manager) Complete  PCP or Specialist appointment within 3-5 days of discharge Complete  HRI or Home Care Consult Complete  SW Recovery Care/Counseling Consult Complete  Palliative Care  Screening Not Applicable  Skilled Nursing Facility Not Applicable

## 2024-04-20 NOTE — Plan of Care (Signed)
 ?  Problem: Clinical Measurements: ?Goal: Will remain free from infection ?Outcome: Progressing ?  ?

## 2024-04-20 NOTE — Plan of Care (Signed)

## 2024-04-20 NOTE — Plan of Care (Signed)

## 2024-04-20 NOTE — Progress Notes (Signed)
 Patient seen and examined. No changes in medical condition. No BM yet. Will add lactulose . Awaiting alcohol  rehab placement,. Continue Augmentin  for cavitary PNA.

## 2024-04-21 DIAGNOSIS — J9601 Acute respiratory failure with hypoxia: Secondary | ICD-10-CM | POA: Diagnosis not present

## 2024-04-21 MED ORDER — LACTULOSE 10 GM/15ML PO SOLN
30.0000 g | Freq: Two times a day (BID) | ORAL | Status: DC | PRN
  Administered 2024-04-21: 13:00:00 30 g via ORAL
  Filled 2024-04-21: qty 45

## 2024-04-21 MED ORDER — BISACODYL 10 MG RE SUPP
10.0000 mg | Freq: Once | RECTAL | Status: DC
  Filled 2024-04-21: qty 1

## 2024-04-21 NOTE — TOC Progression Note (Addendum)
 Transition of Care [facility]) - Progression Note    Patient Details  Name: Gary Mclaughlin MRN: [id number] Date of Birth: 28-May-1970  Transition of Care [facility]) CM/SW Contact  Alfonse JONELLE Rex, RN Phone Number: 04/21/2024, 12:42 PM  Clinical Narrative:   Message received from patient that [facility] requesting updated progress notes. NCM called to [facility] to obtain fax number to admissions, [phone], updated progress notes, labs, vital signs faxed to fax number provided, awaiting open bed for placement.   -3;30pm Call received from [facility] with [facility], no accepted due to medical reasons, team notified.     Expected Discharge Plan:  ([facility]) Barriers to Discharge: Continued Medical Work up               Expected Discharge Plan and Services In-house Referral: NA Discharge Planning Services: CM Consult Post Acute Care Choice:  (Residential SU Treatment) Living arrangements for the past 2 months: Homeless Expected Discharge Date: 04/20/24               DME Arranged: N/A DME Agency: NA       HH Arranged: NA HH Agency: NA         Social Drivers of Health (SDOH) Interventions SDOH Screenings   Food Insecurity: Food Insecurity Present (04/13/2024)  Housing: High Risk (04/13/2024)  Transportation Needs: Unmet Transportation Needs (04/13/2024)  Utilities: At Risk (04/13/2024)  Alcohol  Screen: High Risk (04/10/2023)  Depression (PHQ2-9): Medium Risk (04/14/2023)  Tobacco Use: Medium Risk (04/13/2024)    Readmission Risk Interventions    04/14/2024    2:04 PM  Readmission Risk Prevention Plan  Transportation Screening Complete  Medication Review (RN Care Manager) Complete  PCP or Specialist appointment within 3-5 days of discharge Complete  HRI or Home Care Consult Complete  SW Recovery Care/Counseling Consult Complete  Palliative Care Screening Not Applicable  Skilled Nursing Facility Not Applicable
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
## 2024-04-22 ENCOUNTER — Other Ambulatory Visit (HOSPITAL_COMMUNITY): Payer: Self-pay

## 2024-04-22 DIAGNOSIS — J9601 Acute respiratory failure with hypoxia: Secondary | ICD-10-CM | POA: Diagnosis not present

## 2024-04-22 MED ORDER — AMOXICILLIN-POT CLAVULANATE 875-125 MG PO TABS: 1.0000 | ORAL_TABLET | Freq: Two times a day (BID) | ORAL | 0 refills | Status: DC

## 2024-04-22 MED ORDER — ADULT MULTIVITAMIN W/MINERALS CH: 1.0000 | ORAL_TABLET | Freq: Every day | ORAL | 0 refills | Status: DC

## 2024-04-22 MED ORDER — AMOXICILLIN-POT CLAVULANATE 875-125 MG PO TABS: 1.0000 | ORAL_TABLET | Freq: Two times a day (BID) | ORAL | 0 refills | Status: AC

## 2024-04-22 MED ORDER — VITAMIN B-1 100 MG PO TABS: 100.0000 mg | ORAL_TABLET | Freq: Every day | ORAL | 0 refills | Status: DC

## 2024-04-22 MED ORDER — POLYETHYLENE GLYCOL 3350 17 G PO PACK: 17.0000 g | PACK | Freq: Two times a day (BID) | ORAL | 0 refills | Status: DC

## 2024-04-22 MED ORDER — BUDESONIDE-FORMOTEROL FUMARATE 160-4.5 MCG/ACT IN AERO: 2.0000 | INHALATION_SPRAY | Freq: Two times a day (BID) | RESPIRATORY_TRACT | 0 refills | Status: DC

## 2024-04-22 MED ORDER — HYDROCORTISONE 0.5 % EX CREA: TOPICAL_CREAM | CUTANEOUS | 0 refills | Status: AC | PRN

## 2024-04-22 MED ORDER — GABAPENTIN 100 MG PO CAPS: 100.0000 mg | ORAL_CAPSULE | Freq: Three times a day (TID) | ORAL | 0 refills | Status: DC

## 2024-04-22 MED ORDER — ADULT MULTIVITAMIN W/MINERALS CH: 1.0000 | ORAL_TABLET | Freq: Every day | ORAL | 0 refills | Status: AC

## 2024-04-22 MED ORDER — FOLIC ACID 1 MG PO TABS: 1.0000 mg | ORAL_TABLET | Freq: Every day | ORAL | 0 refills | Status: DC

## 2024-04-22 MED ORDER — ALBUTEROL SULFATE HFA 108 (90 BASE) MCG/ACT IN AERS: 2.0000 | INHALATION_SPRAY | Freq: Four times a day (QID) | RESPIRATORY_TRACT | 0 refills | Status: DC | PRN

## 2024-04-22 MED ORDER — CETIRIZINE HCL 10 MG PO TABS: 10.0000 mg | ORAL_TABLET | Freq: Every day | ORAL | 1 refills | Status: AC

## 2024-04-22 MED ORDER — GUAIFENESIN ER 600 MG PO TB12: 1200.0000 mg | ORAL_TABLET | Freq: Two times a day (BID) | ORAL | 0 refills | Status: DC

## 2024-04-22 MED ORDER — POLYETHYLENE GLYCOL 3350 17 G PO PACK: 17.0000 g | PACK | Freq: Two times a day (BID) | ORAL | 0 refills | Status: AC

## 2024-04-22 MED ORDER — CETIRIZINE HCL 10 MG PO TABS: 10.0000 mg | ORAL_TABLET | Freq: Every day | ORAL | 1 refills | Status: DC

## 2024-04-22 MED ORDER — ALBUTEROL SULFATE HFA 108 (90 BASE) MCG/ACT IN AERS: 2.0000 | INHALATION_SPRAY | Freq: Four times a day (QID) | RESPIRATORY_TRACT | 0 refills | Status: AC | PRN

## 2024-04-22 MED ORDER — NICOTINE 14 MG/24HR TD PT24: 14.0000 mg | MEDICATED_PATCH | Freq: Every day | TRANSDERMAL | 0 refills | Status: AC
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
## 2024-04-22 NOTE — Plan of Care (Signed)
   Problem: Coping: Goal: Level of anxiety will decrease Outcome: Progressing

## 2024-04-22 NOTE — Progress Notes (Signed)
 Patient discharged home, IV removed, discharge paperwork provided and explained, patient verbalized understanding.
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
## 2024-05-15 ENCOUNTER — Other Ambulatory Visit: Payer: Self-pay

## 2024-05-15 ENCOUNTER — Emergency Department (HOSPITAL_COMMUNITY): Payer: MEDICAID

## 2024-05-15 ENCOUNTER — Inpatient Hospital Stay (HOSPITAL_COMMUNITY)
Admission: EM | Admit: 2024-05-15 | Discharge: 2024-05-20 | DRG: 190 | Disposition: A | Discharge status: Home / Self Care | Payer: MEDICAID | Attending: Internal Medicine | Admitting: Internal Medicine

## 2024-05-15 ENCOUNTER — Encounter (HOSPITAL_COMMUNITY): Payer: Self-pay | Admitting: Emergency Medicine

## 2024-05-15 DIAGNOSIS — J440 Chronic obstructive pulmonary disease with acute lower respiratory infection: Secondary | ICD-10-CM | POA: Diagnosis present

## 2024-05-15 DIAGNOSIS — Z5900 Homelessness unspecified: Secondary | ICD-10-CM

## 2024-05-15 DIAGNOSIS — Z79899 Other long term (current) drug therapy: Secondary | ICD-10-CM

## 2024-05-15 DIAGNOSIS — K920 Hematemesis: Secondary | ICD-10-CM | POA: Diagnosis present

## 2024-05-15 DIAGNOSIS — R042 Hemoptysis: Principal | ICD-10-CM | POA: Diagnosis present

## 2024-05-15 DIAGNOSIS — F17210 Nicotine dependence, cigarettes, uncomplicated: Secondary | ICD-10-CM | POA: Diagnosis present

## 2024-05-15 DIAGNOSIS — D649 Anemia, unspecified: Secondary | ICD-10-CM | POA: Diagnosis present

## 2024-05-15 DIAGNOSIS — Z7951 Long term (current) use of inhaled steroids: Secondary | ICD-10-CM

## 2024-05-15 DIAGNOSIS — J188 Other pneumonia, unspecified organism: Secondary | ICD-10-CM | POA: Diagnosis present

## 2024-05-15 DIAGNOSIS — Z1152 Encounter for screening for COVID-19: Secondary | ICD-10-CM

## 2024-05-15 DIAGNOSIS — F32A Depression, unspecified: Secondary | ICD-10-CM | POA: Diagnosis present

## 2024-05-15 DIAGNOSIS — Z8249 Family history of ischemic heart disease and other diseases of the circulatory system: Secondary | ICD-10-CM

## 2024-05-15 DIAGNOSIS — F1011 Alcohol abuse, in remission: Secondary | ICD-10-CM | POA: Diagnosis present

## 2024-05-15 DIAGNOSIS — Z720 Tobacco use: Secondary | ICD-10-CM | POA: Diagnosis present

## 2024-05-15 DIAGNOSIS — J441 Chronic obstructive pulmonary disease with (acute) exacerbation: Principal | ICD-10-CM | POA: Diagnosis present

## 2024-05-15 DIAGNOSIS — J984 Other disorders of lung: Secondary | ICD-10-CM

## 2024-05-15 DIAGNOSIS — Z716 Tobacco abuse counseling: Secondary | ICD-10-CM

## 2024-05-15 DIAGNOSIS — F1090 Alcohol use, unspecified, uncomplicated: Secondary | ICD-10-CM | POA: Diagnosis present

## 2024-05-15 DIAGNOSIS — R918 Other nonspecific abnormal finding of lung field: Secondary | ICD-10-CM | POA: Insufficient documentation

## 2024-05-15 LAB — BASIC METABOLIC PANEL WITH GFR
Anion gap: 11 (ref 5–15)
BUN: 16 mg/dL (ref 6–20)
CO2: 26 mmol/L (ref 22–32)
Calcium: 10.4 mg/dL — ABNORMAL HIGH (ref 8.9–10.3)
Chloride: 102 mmol/L (ref 98–111)
Creatinine, Ser: 0.78 mg/dL (ref 0.61–1.24)
GFR, Estimated: >60 mL/min
Glucose, Bld: 97 mg/dL (ref 70–99)
Potassium: 4.7 mmol/L (ref 3.5–5.1)
Sodium: 138 mmol/L (ref 135–145)

## 2024-05-15 LAB — CBC WITH DIFFERENTIAL/PLATELET
Abs Immature Granulocytes: 0.03 K/uL (ref 0.00–0.07)
Basophils Absolute: 0.1 K/uL (ref 0.0–0.1)
Basophils Relative: 1 %
Eosinophils Absolute: 0.2 K/uL (ref 0.0–0.5)
Eosinophils Relative: 2 %
HCT: 38.3 % — ABNORMAL LOW (ref 39.0–52.0)
Hemoglobin: 12.3 g/dL — ABNORMAL LOW (ref 13.0–17.0)
Immature Granulocytes: 0 %
Lymphocytes Relative: 17 %
Lymphs Abs: 1.7 K/uL (ref 0.7–4.0)
MCH: 30.6 pg (ref 26.0–34.0)
MCHC: 32.1 g/dL (ref 30.0–36.0)
MCV: 95.3 fL (ref 80.0–100.0)
Monocytes Absolute: 0.7 K/uL (ref 0.1–1.0)
Monocytes Relative: 7 %
Neutro Abs: 7.3 K/uL (ref 1.7–7.7)
Neutrophils Relative %: 73 %
Platelets: 370 K/uL (ref 150–400)
RBC: 4.02 MIL/uL — ABNORMAL LOW (ref 4.22–5.81)
RDW: 13.6 % (ref 11.5–15.5)
WBC: 10.1 K/uL (ref 4.0–10.5)
nRBC: 0 % (ref 0.0–0.2)

## 2024-05-15 LAB — TROPONIN T, HIGH SENSITIVITY
Troponin T High Sensitivity: <15 ng/L (ref 0–19)
Troponin T High Sensitivity: <15 ng/L (ref 0–19)

## 2024-05-15 LAB — PRO BRAIN NATRIURETIC PEPTIDE: Pro Brain Natriuretic Peptide: <50 pg/mL

## 2024-05-15 MED ORDER — MAGNESIUM SULFATE 2 GM/50ML IV SOLN
2.0000 g | Freq: Once | INTRAVENOUS | Status: AC
  Administered 2024-05-15: 2 g via INTRAVENOUS
  Filled 2024-05-15: qty 50

## 2024-05-15 MED ORDER — IOHEXOL 300 MG/ML  SOLN
75.0000 mL | Freq: Once | INTRAMUSCULAR | Status: AC | PRN
  Administered 2024-05-15: 75 mL via INTRAVENOUS

## 2024-05-15 MED ORDER — METHYLPREDNISOLONE SODIUM SUCC 125 MG IJ SOLR
125.0000 mg | Freq: Once | INTRAMUSCULAR | Status: AC
  Administered 2024-05-15: 125 mg via INTRAVENOUS
  Filled 2024-05-15: qty 2

## 2024-05-15 MED ORDER — ALBUTEROL SULFATE (2.5 MG/3ML) 0.083% IN NEBU
2.5000 mg | INHALATION_SOLUTION | Freq: Once | RESPIRATORY_TRACT | Status: AC
  Administered 2024-05-15: 2.5 mg via RESPIRATORY_TRACT
  Filled 2024-05-15: qty 3

## 2024-05-15 MED ORDER — IPRATROPIUM-ALBUTEROL 0.5-2.5 (3) MG/3ML IN SOLN
3.0000 mL | Freq: Once | RESPIRATORY_TRACT | Status: AC
  Administered 2024-05-15: 3 mL via RESPIRATORY_TRACT
  Filled 2024-05-15: qty 3

## 2024-05-15 NOTE — ED Notes (Signed)
 Patient transported to CT

## 2024-05-15 NOTE — ED Triage Notes (Signed)
 Pt reports being on anbx since 12/19 for pna.  Has 2 weeks left by his report. Pt has continued coughing, not sleeping well, and now having blood tinged sputum. Reports significant dyspnea on exertion.

## 2024-05-15 NOTE — ED Provider Triage Note (Cosign Needed Addendum)
 Emergency Medicine Provider Triage Evaluation Note  Gary Mclaughlin , a 54 y.o. male  was evaluated in triage.  Pt complains of hematemesis for past 2 days looks like pieces of lung. On 6 weeks of augmentin  for pna, on week 3 and feels no improvement. Increased DOE, cp with cough. Inhaler used when exertion  Found  Partial cavitary lesion in the superior segment of the right lower lobe measuring 2.3 x 1.9 cm, suspicious for infection on CTPE on 04/13/24  Review of Systems  Positive: See hpi Negative:   Physical Exam  BP (!) 134/101 (BP Location: Left Arm)   Pulse 94   Temp 98.2 F (36.8 C) (Oral)   Resp 20   SpO2 90%  Gen:   Awake, no distress   Resp:  Normal effort  MSK:   Moves extremities without difficulty  Other:    Medical Decision Making  Medically screening exam initiated at 4:29 PM.  Appropriate orders placed.  Gary Mclaughlin was informed that the remainder of the evaluation will be completed by another provider, this initial triage assessment does not replace that evaluation, and the importance of remaining in the ED until their evaluation is complete.  Labs, CXR ordered   Gary Tinnie BRAVO, PA 05/15/24 1633    Gary Tinnie BRAVO, PA 05/15/24 [phone]

## 2024-05-15 NOTE — ED Provider Notes (Signed)
 " [place] EMERGENCY DEPARTMENT AT [facility] Provider Note   CSN: [id number] Arrival date & time: 05/15/24  1533     Patient presents with: Hematemesis   Gary Mclaughlin is a 54 y.o. male.  {Add pertinent medical, surgical, social history, OB history to YEP:67052} Patient has a history of COPD.  He complains of shortness of breath and wheezing.  He also complains of coughing up blood for the last couple days.  Patient was diagnosed with a possible lung abscess and is on antibiotics right now  The history is provided by the patient and medical records. No language interpreter was used.  Shortness of Breath Severity:  Moderate Onset quality:  Sudden Timing:  Constant Progression:  Worsening Chronicity:  New Context: not activity   Relieved by:  Nothing Worsened by:  Nothing Ineffective treatments:  None tried Associated symptoms: no abdominal pain, no chest pain, no cough, no headaches and no rash        Prior to Admission medications  Medication Sig Start Date End Date Taking? Authorizing Provider  albuterol  (VENTOLIN  HFA) 108 (90 Base) MCG/ACT inhaler Inhale 2 puffs into the lungs every 6 (six) hours as needed for wheezing or shortness of breath. 04/22/24   Regalado, Belkys A, MD  amoxicillin -clavulanate (AUGMENTIN ) 875-125 MG tablet Take 1 tablet by mouth every 12 (twelve) hours. 04/22/24 06/05/24  Regalado, Belkys A, MD  budesonide -formoterol  (SYMBICORT ) 160-4.5 MCG/ACT inhaler Inhale 2 puffs into the lungs 2 (two) times daily. 04/22/24   Regalado, Belkys A, MD  cetirizine  (ZYRTEC  ALLERGY) 10 MG tablet Take 1 tablet (10 mg total) by mouth daily. 04/22/24   Regalado, Belkys A, MD  folic acid  (FOLVITE ) 1 MG tablet Take 1 tablet (1 mg total) by mouth daily. 04/22/24   Regalado, Belkys A, MD  gabapentin  (NEURONTIN ) 100 MG capsule Take 1 capsule (100 mg total) by mouth 3 (three) times daily. 04/22/24 05/22/24  Regalado, Owen A, MD  guaiFENesin  (MUCINEX ) 600 MG 12 hr  tablet Take 2 tablets (1,200 mg total) by mouth 2 (two) times daily. 04/22/24 05/22/24  Regalado, Belkys A, MD  hydrocortisone  cream 0.5 % Apply topically as needed for itching. 04/22/24   Regalado, Belkys A, MD  Multiple Vitamin (MULTIVITAMIN WITH MINERALS) TABS tablet Take 1 tablet by mouth daily. 04/22/24   Regalado, Belkys A, MD  nicotine  (NICODERM CQ  - DOSED IN MG/24 HOURS) 14 mg/24hr patch Place 1 patch (14 mg total) onto the skin daily. 04/22/24   Regalado, Belkys A, MD  polyethylene glycol (MIRALAX  / GLYCOLAX ) 17 g packet Take 17 g by mouth 2 (two) times daily. 04/22/24   Regalado, Belkys A, MD  thiamine  (VITAMIN B-1) 100 MG tablet Take 1 tablet (100 mg total) by mouth daily. 04/22/24   Regalado, Owen LABOR, MD    Allergies: Patient has no known allergies.    Review of Systems  Constitutional:  Negative for appetite change and fatigue.  HENT:  Negative for congestion, ear discharge and sinus pressure.   Eyes:  Negative for discharge.  Respiratory:  Positive for shortness of breath. Negative for cough.        Hemoptysis  Cardiovascular:  Negative for chest pain.  Gastrointestinal:  Negative for abdominal pain and diarrhea.  Genitourinary:  Negative for frequency and hematuria.  Musculoskeletal:  Negative for back pain.  Skin:  Negative for rash.  Neurological:  Negative for seizures and headaches.  Psychiatric/Behavioral:  Negative for hallucinations.     Updated Vital Signs BP 114/79  Pulse (!) 105   Temp 98.4 F (36.9 C) (Oral)   Resp 19   SpO2 100%   Physical Exam Vitals and nursing note reviewed.  Constitutional:      Appearance: He is well-developed.  HENT:     Head: Normocephalic.     Nose: Nose normal.  Eyes:     General: No scleral icterus.    Conjunctiva/sclera: Conjunctivae normal.  Neck:     Thyroid : No thyromegaly.  Cardiovascular:     Rate and Rhythm: Normal rate and regular rhythm.     Heart sounds: No murmur heard.    No friction rub. No gallop.   Pulmonary:     Breath sounds: No stridor. Wheezing present. No rales.  Chest:     Chest wall: No tenderness.  Abdominal:     General: There is no distension.     Tenderness: There is no abdominal tenderness. There is no rebound.  Musculoskeletal:        General: Normal range of motion.     Cervical back: Neck supple.  Lymphadenopathy:     Cervical: No cervical adenopathy.  Skin:    Findings: No erythema or rash.  Neurological:     Mental Status: He is alert and oriented to person, place, and time.     Motor: No abnormal muscle tone.     Coordination: Coordination normal.  Psychiatric:        Behavior: Behavior normal.     (all labs ordered are listed, but only abnormal results are displayed) Labs Reviewed  CBC WITH DIFFERENTIAL/PLATELET - Abnormal; Notable for the following components:      Result Value   RBC 4.02 (*)    Hemoglobin 12.3 (*)    HCT 38.3 (*)    All other components within normal limits  BASIC METABOLIC PANEL WITH GFR - Abnormal; Notable for the following components:   Calcium 10.4 (*)    All other components within normal limits  PRO BRAIN NATRIURETIC PEPTIDE  TROPONIN T, HIGH SENSITIVITY  TROPONIN T, HIGH SENSITIVITY    EKG: None  Radiology: CT Chest W Contrast Result Date: 05/15/2024 EXAM: CT CHEST WITH CONTRAST 05/15/2024 09:53:58 PM TECHNIQUE: CT of the chest was performed with the administration of 75 mL of iohexol  (OMNIPAQUE ) 300 MG/ML solution. Multiplanar reformatted images are provided for review. Automated exposure control, iterative reconstruction, and/or weight based adjustment of the mA/kV was utilized to reduce the radiation dose to as low as reasonably achievable. COMPARISON: Chest x-ray 05/15/2024, CT chest 03/04/2024, CT angio chest 04/04/2024. CLINICAL HISTORY: Abnormal xray - lung nodule, >= 1 cm. FINDINGS: MEDIASTINUM: Heart and pericardium are unremarkable. The central airways are clear. The thoracic aorta is normal in caliber. The  main pulmonary artery is normal in caliber. LYMPH NODES: Prominent but not enlarged right hilar lymph nodes. Prominent but nonenlarged mediastinal lymph nodes. No axillary lymphadenopathy. LUNGS AND PLEURA: Increase in size of a spiculated 4.2 x 2.9 cm (from 2.3 x 1.9 cm) right lower lobe mass with associated cavitation. Associated surrounding satellite micronodules. Azygous fissure noted. Stable right middle lobe 0.5 x 0.2 cm pulmonary nodule. No pleural effusion or pneumothorax. SOFT TISSUES/BONES: Stable L1 compression fracture. 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 Bilateral gynecomastia. UPPER ABDOMEN: Limited images of the upper abdomen demonstrates no acute abnormality. IMPRESSION: 1. Interval increase in size of a spiculated 4.2 x 2.9 cm (from 2.3 x 1.9 cm) right lower lobe mass with associated cavitation and surrounding satellite micronodules. Finding may represent versus malignancy. 2.  Prominent but not enlarged right hilar and mediastinal lymph nodes. 3. Stable 0.5 x 0.2 cm right middle lobe pulmonary nodule. Electronically signed by: Morgane Naveau MD MD 05/15/2024 10:10 PM EST RP Workstation: [id number]   DG Chest 2 View Result Date: 05/15/2024 CLINICAL DATA:  Cough. EXAM: CHEST - 2 VIEW COMPARISON:  April 13, 2024 FINDINGS: The heart size and mediastinal contours are within normal limits. No acute infiltrate, pleural effusion or pneumothorax is identified. Multiple chronic right-sided rib fractures are seen. The visualized skeletal structures are otherwise unremarkable. IMPRESSION: No active cardiopulmonary disease. Electronically Signed   By: Suzen Dials M.D.   On: 05/15/2024 16:59    {Document cardiac monitor, telemetry assessment procedure when appropriate:32947} Procedures   Medications Ordered in the ED  methylPREDNISolone  sodium succinate (SOLU-MEDROL ) 125 mg/2 mL injection 125 mg (has no administration in time range)  ipratropium-albuterol  (DUONEB) 0.5-2.5 (3) MG/3ML  nebulizer solution 3 mL (3 mLs Nebulization Given 05/15/24 2115)  albuterol  (PROVENTIL ) (2.5 MG/3ML) 0.083% nebulizer solution 2.5 mg (2.5 mg Nebulization Given 05/15/24 2115)  magnesium  sulfate IVPB 2 g 50 mL (2 g Intravenous New Bag/Given 05/15/24 2119)  iohexol  (OMNIPAQUE ) 300 MG/ML solution 75 mL (75 mLs Intravenous Contrast Given 05/15/24 2149)      {Click here for ABCD2, HEART and other calculators REFRESH Note before signing:1}                              Medical Decision Making Amount and/or Complexity of Data Reviewed Radiology: ordered.  Risk Prescription drug management. Decision regarding hospitalization.   Patient with hemoptysis, COPD exacerbation and lung mass.  He will be admitted to medicine  {Document critical care time when appropriate  Document review of labs and clinical decision tools ie CHADS2VASC2, etc  Document your independent review of radiology images and any outside records  Document your discussion with family members, caretakers and with consultants  Document social determinants of health affecting pt's care  Document your decision making why or why not admission, treatments were needed:32947:::1}   Final diagnoses:  Hemoptysis  COPD exacerbation [facility])    ED Discharge Orders     None        "

## 2024-05-16 ENCOUNTER — Encounter (HOSPITAL_COMMUNITY): Payer: Self-pay | Admitting: Internal Medicine

## 2024-05-16 DIAGNOSIS — Z5900 Homelessness unspecified: Secondary | ICD-10-CM | POA: Diagnosis not present

## 2024-05-16 DIAGNOSIS — Z720 Tobacco use: Secondary | ICD-10-CM | POA: Diagnosis not present

## 2024-05-16 DIAGNOSIS — J440 Chronic obstructive pulmonary disease with acute lower respiratory infection: Secondary | ICD-10-CM | POA: Diagnosis present

## 2024-05-16 DIAGNOSIS — D649 Anemia, unspecified: Secondary | ICD-10-CM

## 2024-05-16 DIAGNOSIS — Z48813 Encounter for surgical aftercare following surgery on the respiratory system: Secondary | ICD-10-CM | POA: Diagnosis not present

## 2024-05-16 DIAGNOSIS — Z8249 Family history of ischemic heart disease and other diseases of the circulatory system: Secondary | ICD-10-CM | POA: Diagnosis not present

## 2024-05-16 DIAGNOSIS — K920 Hematemesis: Secondary | ICD-10-CM | POA: Diagnosis present

## 2024-05-16 DIAGNOSIS — Z1152 Encounter for screening for COVID-19: Secondary | ICD-10-CM | POA: Diagnosis not present

## 2024-05-16 DIAGNOSIS — J9601 Acute respiratory failure with hypoxia: Secondary | ICD-10-CM | POA: Diagnosis not present

## 2024-05-16 DIAGNOSIS — Z7951 Long term (current) use of inhaled steroids: Secondary | ICD-10-CM | POA: Diagnosis not present

## 2024-05-16 DIAGNOSIS — F32A Depression, unspecified: Secondary | ICD-10-CM | POA: Diagnosis present

## 2024-05-16 DIAGNOSIS — R918 Other nonspecific abnormal finding of lung field: Secondary | ICD-10-CM | POA: Insufficient documentation

## 2024-05-16 DIAGNOSIS — J849 Interstitial pulmonary disease, unspecified: Secondary | ICD-10-CM | POA: Diagnosis not present

## 2024-05-16 DIAGNOSIS — Z79899 Other long term (current) drug therapy: Secondary | ICD-10-CM | POA: Diagnosis not present

## 2024-05-16 DIAGNOSIS — J188 Other pneumonia, unspecified organism: Secondary | ICD-10-CM | POA: Diagnosis present

## 2024-05-16 DIAGNOSIS — F17210 Nicotine dependence, cigarettes, uncomplicated: Secondary | ICD-10-CM

## 2024-05-16 DIAGNOSIS — J441 Chronic obstructive pulmonary disease with (acute) exacerbation: Secondary | ICD-10-CM | POA: Diagnosis present

## 2024-05-16 DIAGNOSIS — R042 Hemoptysis: Secondary | ICD-10-CM | POA: Diagnosis present

## 2024-05-16 DIAGNOSIS — Z716 Tobacco abuse counseling: Secondary | ICD-10-CM | POA: Diagnosis not present

## 2024-05-16 DIAGNOSIS — F1011 Alcohol abuse, in remission: Secondary | ICD-10-CM | POA: Diagnosis present

## 2024-05-16 DIAGNOSIS — F1010 Alcohol abuse, uncomplicated: Secondary | ICD-10-CM | POA: Diagnosis not present

## 2024-05-16 LAB — CBC
HCT: 38.5 % — ABNORMAL LOW (ref 39.0–52.0)
Hemoglobin: 12.6 g/dL — ABNORMAL LOW (ref 13.0–17.0)
MCH: 31 pg (ref 26.0–34.0)
MCHC: 32.7 g/dL (ref 30.0–36.0)
MCV: 94.8 fL (ref 80.0–100.0)
Platelets: 317 K/uL (ref 150–400)
RBC: 4.06 MIL/uL — ABNORMAL LOW (ref 4.22–5.81)
RDW: 13.6 % (ref 11.5–15.5)
WBC: 8.8 K/uL (ref 4.0–10.5)
nRBC: 0 % (ref 0.0–0.2)

## 2024-05-16 LAB — BASIC METABOLIC PANEL WITH GFR
Anion gap: 10 (ref 5–15)
BUN: 11 mg/dL (ref 6–20)
CO2: 20 mmol/L — ABNORMAL LOW (ref 22–32)
Calcium: 9.8 mg/dL (ref 8.9–10.3)
Chloride: 106 mmol/L (ref 98–111)
Creatinine, Ser: 0.62 mg/dL (ref 0.61–1.24)
GFR, Estimated: >60 mL/min
Glucose, Bld: 168 mg/dL — ABNORMAL HIGH (ref 70–99)
Potassium: 4.6 mmol/L (ref 3.5–5.1)
Sodium: 135 mmol/L (ref 135–145)

## 2024-05-16 LAB — RESP PANEL BY RT-PCR (RSV, FLU A&B, COVID)  RVPGX2
Influenza A by PCR: NEGATIVE
Influenza B by PCR: NEGATIVE
Resp Syncytial Virus by PCR: NEGATIVE
SARS Coronavirus 2 by RT PCR: NEGATIVE

## 2024-05-16 MED ORDER — METHYLPREDNISOLONE SODIUM SUCC 40 MG IJ SOLR
40.0000 mg | Freq: Two times a day (BID) | INTRAMUSCULAR | Status: DC
  Administered 2024-05-16 – 2024-05-19 (×7): 40 mg via INTRAVENOUS
  Filled 2024-05-16 (×7): qty 1

## 2024-05-16 MED ORDER — IPRATROPIUM-ALBUTEROL 0.5-2.5 (3) MG/3ML IN SOLN
3.0000 mL | RESPIRATORY_TRACT | Status: DC
  Administered 2024-05-16 (×6): 3 mL via RESPIRATORY_TRACT
  Filled 2024-05-16 (×7): qty 3

## 2024-05-16 MED ORDER — IPRATROPIUM-ALBUTEROL 0.5-2.5 (3) MG/3ML IN SOLN
3.0000 mL | Freq: Four times a day (QID) | RESPIRATORY_TRACT | Status: DC
  Administered 2024-05-17 – 2024-05-19 (×9): 3 mL via RESPIRATORY_TRACT
  Filled 2024-05-16 (×9): qty 3

## 2024-05-16 MED ORDER — AMOXICILLIN-POT CLAVULANATE 875-125 MG PO TABS
1.0000 | ORAL_TABLET | Freq: Two times a day (BID) | ORAL | Status: DC
  Administered 2024-05-16: 1 via ORAL
  Filled 2024-05-16: qty 1

## 2024-05-16 MED ORDER — ACETAMINOPHEN 325 MG PO TABS
650.0000 mg | ORAL_TABLET | Freq: Four times a day (QID) | ORAL | Status: DC | PRN
  Administered 2024-05-16: 650 mg via ORAL
  Filled 2024-05-16: qty 2

## 2024-05-16 MED ORDER — FOLIC ACID 1 MG PO TABS
1.0000 mg | ORAL_TABLET | Freq: Every day | ORAL | Status: DC
  Administered 2024-05-16 – 2024-05-19 (×4): 1 mg via ORAL
  Filled 2024-05-16 (×4): qty 1

## 2024-05-16 MED ORDER — PIPERACILLIN-TAZOBACTAM 3.375 G IVPB 30 MIN
3.3750 g | Freq: Once | INTRAVENOUS | Status: AC
  Administered 2024-05-16: 3.375 g via INTRAVENOUS
  Filled 2024-05-16: qty 50

## 2024-05-16 MED ORDER — ZOLPIDEM TARTRATE 5 MG PO TABS
5.0000 mg | ORAL_TABLET | Freq: Every evening | ORAL | Status: DC | PRN
  Administered 2024-05-16 – 2024-05-18 (×2): 5 mg via ORAL
  Filled 2024-05-16 (×2): qty 1

## 2024-05-16 MED ORDER — TRIPLE ANTIBIOTIC 3.5-400-5000 EX OINT
1.0000 | TOPICAL_OINTMENT | Freq: Two times a day (BID) | CUTANEOUS | Status: DC
  Administered 2024-05-17 – 2024-05-20 (×7): 1 via CUTANEOUS
  Filled 2024-05-16 (×2): qty 1

## 2024-05-16 MED ORDER — PIPERACILLIN-TAZOBACTAM 3.375 G IVPB
3.3750 g | Freq: Three times a day (TID) | INTRAVENOUS | Status: DC
  Administered 2024-05-16 – 2024-05-20 (×12): 3.375 g via INTRAVENOUS
  Filled 2024-05-16 (×13): qty 50

## 2024-05-16 MED ORDER — IPRATROPIUM-ALBUTEROL 0.5-2.5 (3) MG/3ML IN SOLN: 3.0000 mL | RESPIRATORY_TRACT | Status: DC | PRN

## 2024-05-16 NOTE — H&P (Signed)
 " History and Physical    Gary Mclaughlin FMW:[id number] DOB: February 12, 1971 DOA: 05/15/2024  Patient coming from: Home.  Chief Complaint: Shortness of breath.  Coughing up blood.  HPI: Gary Mclaughlin is a 54 y.o. male with history of COPD, alcohol  abuse recently admitted for cavitary pneumonia in the setting of COPD exacerbation was discharged on Augmentin  and has been still taking Augmentin  presents to the ER with coughing up blood last 2 days.  Patient also states that since discharge his breathing also has progressively got worse and has been having persistent wheezing.  Denies chest pain fever or chills.  Patient states his last drink of alcohol  was about 33 days ago and had no alcohol  since his last discharge on 04/22/2024.  ED Course: In the ER patient was found to be diffusely wheezing.  Required 2 L oxygen to maintain sats.  proBNP and troponins were undetectable.  Hemoglobin 12.3 which is at baseline.  CT scan shows interval increase in size of the 4.2 into 2.9 cm spiculated right lower lobe mass with associated cavitation and surrounding satellite micronodules.  Findings were concerning for malignancy.  Patient admitted for further workup.  Patient was given IV steroids nebulizer antibiotics.      Review of Systems: As per HPI, rest all negative.   Past Medical History:  Diagnosis Date   Depression    ETOH abuse    Hepatic steatosis 10/30/2021   Seizure (HCC)    Subarachnoid hemorrhage (HCC) 07/28/2017   Tobacco abuse 10/27/2021    Past Surgical History:  Procedure Laterality Date   ORIF PELVIC FRACTURE WITH PERCUTANEOUS SCREWS Left 04/17/2022   Procedure: SI SCREW AND ORIF SYMPHYSIS PELVIC RING;  Surgeon: Kendal Franky SQUIBB, MD;  Location: [facility] OR;  Service: Orthopedics;  Laterality: Left;     reports that he has quit smoking. His smoking use included cigarettes. He has a 25 pack-year smoking history. He has never used smokeless tobacco. He reports current alcohol  use. He reports that  he does not currently use drugs after having used the following drugs: Marijuana.  Allergies[1]  Family History  Problem Relation Age of Onset   Hypertension Other     Prior to Admission medications  Medication Sig Start Date End Date Taking? Authorizing Provider  albuterol  (VENTOLIN  HFA) 108 (90 Base) MCG/ACT inhaler Inhale 2 puffs into the lungs every 6 (six) hours as needed for wheezing or shortness of breath. 04/22/24   Regalado, Belkys A, MD  amoxicillin -clavulanate (AUGMENTIN ) 875-125 MG tablet Take 1 tablet by mouth every 12 (twelve) hours. 04/22/24 06/05/24  Regalado, Belkys A, MD  budesonide -formoterol  (SYMBICORT ) 160-4.5 MCG/ACT inhaler Inhale 2 puffs into the lungs 2 (two) times daily. 04/22/24   Regalado, Belkys A, MD  cetirizine  (ZYRTEC  ALLERGY) 10 MG tablet Take 1 tablet (10 mg total) by mouth daily. 04/22/24   Regalado, Belkys A, MD  folic acid  (FOLVITE ) 1 MG tablet Take 1 tablet (1 mg total) by mouth daily. 04/22/24   Regalado, Belkys A, MD  gabapentin  (NEURONTIN ) 100 MG capsule Take 1 capsule (100 mg total) by mouth 3 (three) times daily. 04/22/24 05/22/24  Regalado, Owen A, MD  guaiFENesin  (MUCINEX ) 600 MG 12 hr tablet Take 2 tablets (1,200 mg total) by mouth 2 (two) times daily. 04/22/24 05/22/24  Regalado, Belkys A, MD  hydrocortisone  cream 0.5 % Apply topically as needed for itching. 04/22/24   Regalado, Belkys A, MD  Multiple Vitamin (MULTIVITAMIN WITH MINERALS) TABS tablet Take 1 tablet by mouth daily. 04/22/24   Regalado,  Belkys A, MD  nicotine  (NICODERM CQ  - DOSED IN MG/24 HOURS) 14 mg/24hr patch Place 1 patch (14 mg total) onto the skin daily. 04/22/24   Regalado, Belkys A, MD  polyethylene glycol (MIRALAX  / GLYCOLAX ) 17 g packet Take 17 g by mouth 2 (two) times daily. 04/22/24   Regalado, Belkys A, MD  thiamine  (VITAMIN B-1) 100 MG tablet Take 1 tablet (100 mg total) by mouth daily. 04/22/24   Regalado, Owen LABOR, MD    Physical Exam: Constitutional: Moderately built  and nourished. Vitals:   05/15/24 2302 05/16/24 0012 05/16/24 0148 05/16/24 0252  BP:    123/81  Pulse:    75  Resp:    18  Temp:  98.2 F (36.8 C) 97.6 F (36.4 C)   TempSrc:  Oral Oral   SpO2: 92%   96%   Eyes: Anicteric no pallor. ENMT: No discharge from the ears eyes nose or mouth. Neck: No mass felt.  No neck rigidity. Respiratory: Bilateral expiratory wheeze and no crepitations. Cardiovascular: S1-S2 heard. Abdomen: Soft nontender bowel sound present. Musculoskeletal: No edema. Skin: No rash. Neurologic: Alert awake oriented to time place and person.  Moves all extremities. Psychiatric: Appears normal.  Normal affect.   Labs on Admission: I have personally reviewed following labs and imaging studies  CBC: Recent Labs  Lab 05/15/24 1658  WBC 10.1  NEUTROABS 7.3  HGB 12.3*  HCT 38.3*  MCV 95.3  PLT 370   Basic Metabolic Panel: Recent Labs  Lab 05/15/24 1658  NA 138  K 4.7  CL 102  CO2 26  GLUCOSE 97  BUN 16  CREATININE 0.78  CALCIUM 10.4*   GFR: CrCl cannot be calculated (Unknown ideal weight.). Liver Function Tests: No results for input(s): AST, ALT, ALKPHOS, BILITOT, PROT, ALBUMIN in the last 168 hours. No results for input(s): LIPASE, AMYLASE in the last 168 hours. No results for input(s): AMMONIA in the last 168 hours. Coagulation Profile: No results for input(s): INR, PROTIME in the last 168 hours. Cardiac Enzymes: No results for input(s): CKTOTAL, CKMB, CKMBINDEX, TROPONINI in the last 168 hours. BNP (last 3 results) Recent Labs    05/15/24 1658  PROBNP <50.0   HbA1C: No results for input(s): HGBA1C in the last 72 hours. CBG: No results for input(s): GLUCAP in the last 168 hours. Lipid Profile: No results for input(s): CHOL, HDL, LDLCALC, TRIG, CHOLHDL, LDLDIRECT in the last 72 hours. Thyroid  Function Tests: No results for input(s): TSH, T4TOTAL, FREET4, T3FREE, THYROIDAB in the  last 72 hours. Anemia Panel: No results for input(s): VITAMINB12, FOLATE, FERRITIN, TIBC, IRON, RETICCTPCT in the last 72 hours. Urine analysis:    Component Value Date/Time   COLORURINE YELLOW 08/06/2023 0332   APPEARANCEUR HAZY (A) 08/06/2023 0332   LABSPEC 1.011 08/06/2023 0332   PHURINE 5.0 08/06/2023 0332   GLUCOSEU NEGATIVE 08/06/2023 0332   HGBUR SMALL (A) 08/06/2023 0332   BILIRUBINUR NEGATIVE 08/06/2023 0332   KETONESUR NEGATIVE 08/06/2023 0332   PROTEINUR 100 (A) 08/06/2023 0332   UROBILINOGEN 0.2 10/22/2011 1535   NITRITE NEGATIVE 08/06/2023 0332   LEUKOCYTESUR NEGATIVE 08/06/2023 0332   Sepsis Labs: @LABRCNTIP (procalcitonin:4,lacticidven:4) )No results found for this or any previous visit (from the past 240 hours).   Radiological Exams on Admission: CT Chest W Contrast Result Date: 05/15/2024 EXAM: CT CHEST WITH CONTRAST 05/15/2024 09:53:58 PM TECHNIQUE: CT of the chest was performed with the administration of 75 mL of iohexol  (OMNIPAQUE ) 300 MG/ML solution. Multiplanar reformatted images are provided for review.  Automated exposure control, iterative reconstruction, and/or weight based adjustment of the mA/kV was utilized to reduce the radiation dose to as low as reasonably achievable. COMPARISON: Chest x-ray 05/15/2024, CT chest 03/04/2024, CT angio chest 04/04/2024. CLINICAL HISTORY: Abnormal xray - lung nodule, >= 1 cm. FINDINGS: MEDIASTINUM: Heart and pericardium are unremarkable. The central airways are clear. The thoracic aorta is normal in caliber. The main pulmonary artery is normal in caliber. LYMPH NODES: Prominent but not enlarged right hilar lymph nodes. Prominent but nonenlarged mediastinal lymph nodes. No axillary lymphadenopathy. LUNGS AND PLEURA: Increase in size of a spiculated 4.2 x 2.9 cm (from 2.3 x 1.9 cm) right lower lobe mass with associated cavitation. Associated surrounding satellite micronodules. Azygous fissure noted. Stable right middle  lobe 0.5 x 0.2 cm pulmonary nodule. No pleural effusion or pneumothorax. SOFT TISSUES/BONES: Stable L1 compression fracture. 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 Bilateral gynecomastia. UPPER ABDOMEN: Limited images of the upper abdomen demonstrates no acute abnormality. IMPRESSION: 1. Interval increase in size of a spiculated 4.2 x 2.9 cm (from 2.3 x 1.9 cm) right lower lobe mass with associated cavitation and surrounding satellite micronodules. Finding may represent versus malignancy. 2. Prominent but not enlarged right hilar and mediastinal lymph nodes. 3. Stable 0.5 x 0.2 cm right middle lobe pulmonary nodule. Electronically signed by: Morgane Naveau MD MD 05/15/2024 10:10 PM EST RP Workstation: [id number]   DG Chest 2 View Result Date: 05/15/2024 CLINICAL DATA:  Cough. EXAM: CHEST - 2 VIEW COMPARISON:  April 13, 2024 FINDINGS: The heart size and mediastinal contours are within normal limits. No acute infiltrate, pleural effusion or pneumothorax is identified. Multiple chronic right-sided rib fractures are seen. The visualized skeletal structures are otherwise unremarkable. IMPRESSION: No active cardiopulmonary disease. Electronically Signed   By: Suzen Dials M.D.   On: 05/15/2024 16:59    EKG: Independently reviewed.  Has tachycardia with nonspecific ST changes.  Assessment/Plan Principal Problem:   COPD with acute exacerbation (HCC) Active Problems:   Anemia   Lung mass   Hemoptysis    COPD exacerbation  placed on IV steroids nebulizer scheduled and as needed. Right lower lobe spiculated lung mass with cavitation increasing in size with hemoptysis.  Will consult pulmonary.  Continue antibiotics. Anemia appears to be chronic check anemia panel with next blood draw. History of alcohol  abuse has not had any alcohol  in the last 1 month.  Since patient has enlarging lung mass with cavitation with COPD exacerbation will need further workup and more than 2 midnight stay.  DVT  prophylaxis: SCDs. Code Status: Full code. Family Communication: Discussed with patient. Disposition Plan: Medical floor. Consults called: Will consult pulmonologist in the morning. Admission status: Inpatient.         [1] No Known Allergies  "

## 2024-05-16 NOTE — Plan of Care (Signed)
 Courtesy visit- No billing:  Patient seen and examined today morning.  He is admitted to hospitalist service for evaluation of hemoptysis, COPD exacerbation, right lower lobe spiculated lung mass.  Patient has cough, pinkish phlegm, last discharged 04/22/2024 for cavitary pneumonia on Augmentin  therapy.  Patient completed course of antibiotics as prescribed.  He is currently requiring 2 L supplemental oxygen.  Plan -I discussed with pulmonary, I will see him as consultation.  Patient will be continued on DuoNebs, IV steroids, IV Zosyn  therapy.  Continue supplemental oxygen to maintain saturation greater than 92%.  Encourage out of bed to chair, incentive spirometry.  Physical therapy evaluation.  Further management as per clinical course.

## 2024-05-16 NOTE — Consult Note (Signed)
" ° °  NAME:  Gary Mclaughlin, MRN:  [id number], DOB:  18-Apr-1971, LOS: 0 ADMISSION DATE:  05/15/2024, CONSULTATION DATE: 05/16/2024 REFERRING MD: CANDIE Pore MD, CHIEF COMPLAINT: Cavitary lung lesion  History of Present Illness:   54 year old with alcohol  abuse, COPD, smoker presenting with hemoptysis, COPD exacerbation and worsening cavitary pneumonia.  Hemoptysis - Hemoptysis present for two days. - Sputum initially bright green, then clear, now blood tinged. - No improvement in symptoms despite six-week course of Augmentin  twice daily, taken as prescribed.  Cavitary pneumonia - Cavitary pneumonia diagnosed during admission on December 9th for COPD exacerbation, EtOH intoxication - Discharged on 6 weeks of Augmentin  and per patient he has been taking the medication without missing a dose.. - No clinical improvement after prolonged antibiotic therapy.  Tobacco use - Smokes 1.5 packs of cigarettes daily. - Smoking since age 65. - Desires to quit smoking but finds it difficult.  Pertinent  Medical History    has a past medical history of Depression, ETOH abuse, Hepatic steatosis (10/30/2021), Seizure (HCC), Subarachnoid hemorrhage (HCC) (07/28/2017), and Tobacco abuse (10/27/2021).   Significant Hospital Events: Including procedures, antibiotic start and stop dates in addition to other pertinent events     Interim History / Subjective:    Objective    Blood pressure 128/70, pulse 93, temperature 97.9 F (36.6 C), temperature source Oral, resp. rate 16, weight 68 kg, SpO2 94%.        Intake/Output Summary (Last 24 hours) at 05/16/2024 1138 Last data filed at 05/16/2024 0502 Gross per 24 hour  Intake 100 ml  Output --  Net 100 ml   Filed Weights   05/16/24 0300  Weight: 68 kg    Examination: Well kempt, disheveled.  Chronically ill-appearing Heart rate regular rate and rhythm Lungs with bilateral expiratory wheeze Abdomen soft, positive bowel sounds Extremities  with no edema.  CT chest with interval increase in size of spiculated right lower lobe mass with cavitation.  Prominent right hilar, mediastinal lymph node 0.5 cm right middle lobe pulmonary nodule.  Resolved problem list   Assessment and Plan  Cavitary pneumonia with hemoptysis Cavitary pneumonia with hemoptysis, initially diagnosed in October, with recent increase in cavity size and onset of hemoptysis over the past two days. Despite adherence to a six-week course of Augmentin , the condition has worsened. Differential diagnosis includes cancer, abscess, fungal infection, or atypical infection.  - Scheduled bronchoscopy with navigation to evaluate the cavity, potentially on Friday. -We would like to get this done before discharge as patient is homeless and cannot keep follow-up appointments. - Continue current antibiotic therapy with Zosyn   Chronic obstructive pulmonary disease with acute exacerbation COPD exacerbation with ongoing wheezing. Currently receiving treatment with nebulizers and antibiotics. - Continue nebulizer treatments, add Brovana , Pulmicort , Yupelri - IV Solu-Medrol  40 mg Q12  Tobacco use disorder Long-standing tobacco use disorder with current smoking of approximately one and a half packs per day. Smoking cessation is critical for improving respiratory health. - Advised smoking cessation.  Signature:   Laneya Gasaway MD [facility] See Amion for pager  If no response to pager , please call [phone] until 7pm After 7:00 pm call Elink  [phone] 05/16/2024, 11:45 AM         "

## 2024-05-17 DIAGNOSIS — R918 Other nonspecific abnormal finding of lung field: Secondary | ICD-10-CM | POA: Diagnosis not present

## 2024-05-17 DIAGNOSIS — Z720 Tobacco use: Secondary | ICD-10-CM

## 2024-05-17 DIAGNOSIS — R042 Hemoptysis: Secondary | ICD-10-CM | POA: Diagnosis not present

## 2024-05-17 DIAGNOSIS — F1010 Alcohol abuse, uncomplicated: Secondary | ICD-10-CM

## 2024-05-17 DIAGNOSIS — J441 Chronic obstructive pulmonary disease with (acute) exacerbation: Secondary | ICD-10-CM | POA: Diagnosis not present

## 2024-05-17 MED ORDER — CLONAZEPAM 0.5 MG PO TABS
0.5000 mg | ORAL_TABLET | Freq: Three times a day (TID) | ORAL | Status: DC | PRN
  Administered 2024-05-17: 0.5 mg via ORAL
  Filled 2024-05-17: qty 1

## 2024-05-17 MED ORDER — ADULT MULTIVITAMIN W/MINERALS CH
1.0000 | ORAL_TABLET | Freq: Every day | ORAL | Status: DC
  Administered 2024-05-17 – 2024-05-19 (×3): 1 via ORAL
  Filled 2024-05-17 (×3): qty 1
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
## 2024-05-17 NOTE — Progress Notes (Signed)
 " Progress Note   Patient: Gary Mclaughlin FMW:[id number] DOB: 1970-12-20 DOA: 05/15/2024     1 DOS: the patient was seen and examined on 05/17/2024   Brief hospital course: Gary Mclaughlin is a 54 y.o. male with history of COPD, alcohol  abuse recently admitted for cavitary pneumonia in the setting of COPD exacerbation was discharged on Augmentin  and has been still taking Augmentin  presents to the ER with coughing up blood last 2 days.  CT scan shows interval increase in size of spiculated right lower lobe mass associated with cavitation and surrounding satellite micronodules.  Findings concerning for malignancy.  Patient is started on broad-spectrum antibiotics admitted to hospitalist service.  Pulmonology consulted.  Assessment and Plan: Cavitary pneumonia Hemoptysis Rule out malignancy- Patient will be continued on broad-spectrum antibiotics. Pulmonology evaluation appreciated-plan for bronchoscopy this Friday. Continue bronchodilator therapy.  COPD exacerbation: Continue IV Solu-Medrol  40 mg every 12 hourly. Continue nebulizer treatments. Pulmonology added Brovana , Pulmicort  and Yupelri.  Tobacco abuse: Discussed with him regarding ill effects of tobacco. Smoking cessation counseled. Time spent on smoking cessation 5 minutes.  Alcohol  abuse: States he quit drinking more than a month ago. Watch for withdrawal symptoms. He does feel anxious due to his medical condition, started Ativan  as needed for agitation.  Homeless: Patient will need TOC evaluation for discharge planning.    Out of bed to chair. Incentive spirometry. Nursing supportive care. Fall, aspiration precautions. Diet:  Diet Orders (From admission, onward)     Start     Ordered   05/16/24 0053  Diet regular Room service appropriate? Yes; Fluid consistency: Thin  Diet effective now       Question Answer Comment  Room service appropriate? Yes   Fluid consistency: Thin      05/16/24 0052           DVT  prophylaxis: SCDs Start: 05/16/24 0053  Level of care: Telemetry   Code Status: Full Code  Subjective: Patient is seen and examined today morning.  He is lying comfortably.  Feels better.  Hemoptysis improved.  He is off supplemental oxygen.  Getting out of bed.  Eating fair.  Physical Exam: Vitals:   05/17/24 0221 05/17/24 0435 05/17/24 0724 05/17/24 1410  BP:  130/78  (!) 127/90  Pulse:  94  (!) 102  Resp:  15  20  Temp:  97.9 F (36.6 C)  99.1 F (37.3 C)  TempSrc:  Oral  Oral  SpO2: 93% 90% 96% 98%  Weight:      Height:        General - Middle aged Caucasian male, no apparent distress HEENT - PERRLA, EOMI, atraumatic head, non tender sinuses. Lung - Clear, basal rales, rhonchi, diffuse wheezes. Heart - S1, S2 heard, no murmurs, rubs, no pedal edema. Abdomen - Soft, non tender, bowel sounds good Neuro - Alert, awake and oriented x 3, non focal exam. Skin - Warm and dry.  Data Reviewed:      Latest Ref Rng & Units 05/16/2024    4:30 AM 05/15/2024    4:58 PM 04/17/2024    5:18 AM  CBC  WBC 4.0 - 10.5 K/uL 8.8  10.1  7.9   Hemoglobin 13.0 - 17.0 g/dL 87.3  87.6  88.4   Hematocrit 39.0 - 52.0 % 38.5  38.3  35.4   Platelets 150 - 400 K/uL 317  370  266       Latest Ref Rng & Units 05/16/2024    4:30 AM 05/15/2024  4:58 PM 04/17/2024    5:18 AM  BMP  Glucose 70 - 99 mg/dL 831  97  99   BUN 6 - 20 mg/dL 11  16  21    Creatinine 0.61 - 1.24 mg/dL 9.37  9.21  9.39   Sodium 135 - 145 mmol/L 135  138  134   Potassium 3.5 - 5.1 mmol/L 4.6  4.7  4.2   Chloride 98 - 111 mmol/L 106  102  103   CO2 22 - 32 mmol/L 20  26  23    Calcium 8.9 - 10.3 mg/dL 9.8  89.5  89.9    CT Chest W Contrast Result Date: 05/15/2024 EXAM: CT CHEST WITH CONTRAST 05/15/2024 09:53:58 PM TECHNIQUE: CT of the chest was performed with the administration of 75 mL of iohexol  (OMNIPAQUE ) 300 MG/ML solution. Multiplanar reformatted images are provided for review. Automated exposure control, iterative  reconstruction, and/or weight based adjustment of the mA/kV was utilized to reduce the radiation dose to as low as reasonably achievable. COMPARISON: Chest x-ray 05/15/2024, CT chest 03/04/2024, CT angio chest 04/04/2024. CLINICAL HISTORY: Abnormal xray - lung nodule, >= 1 cm. FINDINGS: MEDIASTINUM: Heart and pericardium are unremarkable. The central airways are clear. The thoracic aorta is normal in caliber. The main pulmonary artery is normal in caliber. LYMPH NODES: Prominent but not enlarged right hilar lymph nodes. Prominent but nonenlarged mediastinal lymph nodes. No axillary lymphadenopathy. LUNGS AND PLEURA: Increase in size of a spiculated 4.2 x 2.9 cm (from 2.3 x 1.9 cm) right lower lobe mass with associated cavitation. Associated surrounding satellite micronodules. Azygous fissure noted. Stable right middle lobe 0.5 x 0.2 cm pulmonary nodule. No pleural effusion or pneumothorax. SOFT TISSUES/BONES: Stable L1 compression fracture. 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 Bilateral gynecomastia. UPPER ABDOMEN: Limited images of the upper abdomen demonstrates no acute abnormality. IMPRESSION: 1. Interval increase in size of a spiculated 4.2 x 2.9 cm (from 2.3 x 1.9 cm) right lower lobe mass with associated cavitation and surrounding satellite micronodules. Finding may represent versus malignancy. 2. Prominent but not enlarged right hilar and mediastinal lymph nodes. 3. Stable 0.5 x 0.2 cm right middle lobe pulmonary nodule. Electronically signed by: Morgane Naveau MD MD 05/15/2024 10:10 PM EST RP Workstation: [id number]   DG Chest 2 View Result Date: 05/15/2024 CLINICAL DATA:  Cough. EXAM: CHEST - 2 VIEW COMPARISON:  April 13, 2024 FINDINGS: The heart size and mediastinal contours are within normal limits. No acute infiltrate, pleural effusion or pneumothorax is identified. Multiple chronic right-sided rib fractures are seen. The visualized skeletal structures are otherwise unremarkable. IMPRESSION:  No active cardiopulmonary disease. Electronically Signed   By: Suzen Dials M.D.   On: 05/15/2024 16:59    Family Communication: Discussed with patient, he understand and agree. All questions answered.  Disposition: Status is: Inpatient Remains inpatient appropriate because: IV antibiotics, steroids, pulmonary follow-up, bronchoscopy.  Planned Discharge Destination: Home     Time spent: 45 minutes  Author: Concepcion Riser, MD 05/17/2024 2:53 PM Secure chat 7am to 7pm For on call review [email or web address]    "

## 2024-05-17 NOTE — Progress Notes (Signed)
" ° °  NAME:  Gary Mclaughlin, MRN:  [id number], DOB:  June 13, 1970, LOS: 1 ADMISSION DATE:  05/15/2024, CONSULTATION DATE: 05/16/2024 REFERRING MD: CANDIE Pore MD, CHIEF COMPLAINT: Cavitary lung lesion  History of Present Illness:   54 year old with alcohol  abuse, COPD, smoker presenting with hemoptysis, COPD exacerbation and worsening cavitary pneumonia.  Hemoptysis - Hemoptysis present for two days. - Sputum initially bright green, then clear, now blood tinged. - No improvement in symptoms despite six-week course of Augmentin  twice daily, taken as prescribed.  Cavitary pneumonia - Cavitary pneumonia diagnosed during admission on December 9th for COPD exacerbation, EtOH intoxication - Discharged on 6 weeks of Augmentin  and per patient he has been taking the medication without missing a dose.. - No clinical improvement after prolonged antibiotic therapy.  Tobacco use - Smokes 1.5 packs of cigarettes daily. - Smoking since age 67. - Desires to quit smoking but finds it difficult.  Pertinent  Medical History    has a past medical history of Depression, ETOH abuse, Hepatic steatosis (10/30/2021), Seizure (HCC), Subarachnoid hemorrhage (HCC) (07/28/2017), and Tobacco abuse (10/27/2021).   Significant Hospital Events: Including procedures, antibiotic start and stop dates in addition to other pertinent events   1/11 admit  Interim History / Subjective:   Breathing improved.  No more episodes of hemoptysis  Objective    Blood pressure 130/78, pulse 94, temperature 97.9 F (36.6 C), temperature source Oral, resp. rate 15, height 5' 8 (1.727 m), weight 68.5 kg, SpO2 96%.        Intake/Output Summary (Last 24 hours) at 05/17/2024 0956 Last data filed at 05/17/2024 0850 Gross per 24 hour  Intake 1370.83 ml  Output 850 ml  Net 520.83 ml   Filed Weights   05/16/24 0300 05/16/24 1519  Weight: 68 kg 68.5 kg    Examination: Chronically ill-appearing, disheveled Heart rate  regular rate and rhythm Lungs with bilateral expiratory wheeze Abdomen soft with positive bowel sounds Extremities with no edema  CT chest on admission with interval increase in size of spiculated right lower lobe mass with cavitation.  Prominent right hilar, mediastinal lymph node 0.5 cm right middle lobe pulmonary nodule.  Resolved problem list   Assessment and Plan  Cavitary pneumonia with hemoptysis Cavitary pneumonia with hemoptysis, initially diagnosed in October, with recent increase in cavity size and onset of hemoptysis over the past two days. Despite adherence to a six-week course of Augmentin , the condition has worsened. Differential diagnosis includes cancer, abscess, fungal infection, or atypical infection.  - Scheduled bronchoscopy with navigation to evaluate the cavity, on Friday 1/16 - We would like to get this done before discharge as patient is homeless and cannot keep follow-up appointments. - Continue current antibiotic therapy with Zosyn   Chronic obstructive pulmonary disease with acute exacerbation COPD exacerbation with ongoing wheezing. Currently receiving treatment with nebulizers and antibiotics. - Continue nebulizer treatments, add Brovana , Pulmicort , Yupelri - IV Solu-Medrol  40 mg Q12  Tobacco use disorder Long-standing tobacco use disorder with current smoking of approximately one and a half packs per day. Smoking cessation is critical for improving respiratory health. - Advised smoking cessation.  Signature:   Tripton Ned MD [facility] See Amion for pager  If no response to pager , please call [phone] until 7pm After 7:00 pm call Elink  [phone] 05/17/2024, 9:56 AM         "

## 2024-05-18 DIAGNOSIS — J441 Chronic obstructive pulmonary disease with (acute) exacerbation: Secondary | ICD-10-CM | POA: Diagnosis not present

## 2024-05-18 DIAGNOSIS — Z720 Tobacco use: Secondary | ICD-10-CM | POA: Diagnosis not present

## 2024-05-18 DIAGNOSIS — R918 Other nonspecific abnormal finding of lung field: Secondary | ICD-10-CM | POA: Diagnosis not present

## 2024-05-18 DIAGNOSIS — R042 Hemoptysis: Secondary | ICD-10-CM | POA: Diagnosis not present

## 2024-05-18 NOTE — Progress Notes (Signed)
 " Progress Note   Patient: Gary Mclaughlin FMW:[id number] DOB: 1971/03/05 DOA: 05/15/2024     2 DOS: the patient was seen and examined on 05/18/2024   Brief hospital course: Firas Cumberland is a 54 y.o. male with history of COPD, alcohol  abuse recently admitted for cavitary pneumonia in the setting of COPD exacerbation was discharged on Augmentin  and has been still taking Augmentin  presents to the ER with coughing up blood last 2 days.  CT scan shows interval increase in size of spiculated right lower lobe mass associated with cavitation and surrounding satellite micronodules.  Findings concerning for malignancy.  Patient is started on broad-spectrum antibiotics admitted to hospitalist service.  Pulmonology consulted.  Assessment and Plan: Cavitary pneumonia Hemoptysis Rule out malignancy- Patient will be continued on broad-spectrum antibiotics. Pulmonology evaluation appreciated-plan for bronchoscopy this Friday 1/16. Continue bronchodilator therapy.  COPD exacerbation: Continue IV Solu-Medrol  40 mg every 12 hourly. Continue nebulizer treatments. Pulmonology added Brovana , Pulmicort  and Yupelri.  Tobacco abuse: Discussed with him regarding ill effects of tobacco. Smoking cessation counseled. Time spent on smoking cessation 5 minutes.  Alcohol  abuse: States he quit drinking more than a month ago. Ativan  as needed for agitation.  Homeless: Patient will need TOC evaluation for discharge planning.    Out of bed to chair. Incentive spirometry. Nursing supportive care. Fall, aspiration precautions. Diet:  Diet Orders (From admission, onward)     Start     Ordered   05/16/24 0053  Diet regular Room service appropriate? Yes; Fluid consistency: Thin  Diet effective now       Question Answer Comment  Room service appropriate? Yes   Fluid consistency: Thin      05/16/24 0052           DVT prophylaxis: SCDs Start: 05/16/24 0053  Level of care: Telemetry   Code Status: Full  Code  Subjective: Patient is seen and examined today morning.  He is lying comfortably.  Feels better.  He is on 2L supplemental oxygen.  Getting out of bed.  Eating fair.  Physical Exam: Vitals:   05/18/24 0434 05/18/24 0827 05/18/24 1311 05/18/24 1415  BP: (!) 141/98  (!) 154/89   Pulse: 87  80   Resp: 18  20   Temp: 98.2 F (36.8 C)  98 F (36.7 C)   TempSrc: Oral  Oral   SpO2: 97% (!) 89% 91% (!) 89%  Weight:      Height:        General - Middle aged Caucasian male, no apparent distress HEENT - PERRLA, EOMI, atraumatic head, non tender sinuses. Lung - Clear, basal rales, rhonchi, diffuse wheezes. Heart - S1, S2 heard, no murmurs, rubs, no pedal edema. Abdomen - Soft, non tender, bowel sounds good Neuro - Alert, awake and oriented x 3, non focal exam. Skin - Warm and dry.  Data Reviewed:      Latest Ref Rng & Units 05/16/2024    4:30 AM 05/15/2024    4:58 PM 04/17/2024    5:18 AM  CBC  WBC 4.0 - 10.5 K/uL 8.8  10.1  7.9   Hemoglobin 13.0 - 17.0 g/dL 87.3  87.6  88.4   Hematocrit 39.0 - 52.0 % 38.5  38.3  35.4   Platelets 150 - 400 K/uL 317  370  266       Latest Ref Rng & Units 05/16/2024    4:30 AM 05/15/2024    4:58 PM 04/17/2024    5:18 AM  BMP  Glucose 70 -  99 mg/dL 831  97  99   BUN 6 - 20 mg/dL 11  16  21    Creatinine 0.61 - 1.24 mg/dL 9.37  9.21  9.39   Sodium 135 - 145 mmol/L 135  138  134   Potassium 3.5 - 5.1 mmol/L 4.6  4.7  4.2   Chloride 98 - 111 mmol/L 106  102  103   CO2 22 - 32 mmol/L 20  26  23    Calcium 8.9 - 10.3 mg/dL 9.8  89.5  89.9    No results found.   Family Communication: Discussed with patient, he understand and agree. All questions answered.  Disposition: Status is: Inpatient Remains inpatient appropriate because: IV antibiotics, steroids, pulmonary follow-up, bronchoscopy.  Planned Discharge Destination: Home     Time spent: 44 minutes  Author: Concepcion Riser, MD 05/18/2024 5:34 PM Secure chat 7am to 7pm For  on call review [email or web address].    "

## 2024-05-18 NOTE — Progress Notes (Signed)
" ° °  NAME:  Gary Mclaughlin, MRN:  [id number], DOB:  05-01-1971, LOS: 2 ADMISSION DATE:  05/15/2024, CONSULTATION DATE: 05/16/2024 REFERRING MD: CANDIE Pore MD, CHIEF COMPLAINT: Cavitary lung lesion  History of Present Illness:   54 year old with alcohol  abuse, COPD, smoker presenting with hemoptysis, COPD exacerbation and worsening cavitary pneumonia.  Hemoptysis - Hemoptysis present for two days. - Sputum initially bright green, then clear, now blood tinged. - No improvement in symptoms despite six-week course of Augmentin  twice daily, taken as prescribed.  Cavitary pneumonia - Cavitary pneumonia diagnosed during admission on December 9th for COPD exacerbation, EtOH intoxication - Discharged on 6 weeks of Augmentin  and per patient he has been taking the medication without missing a dose.. - No clinical improvement after prolonged antibiotic therapy.  Tobacco use - Smokes 1.5 packs of cigarettes daily. - Smoking since age 89. - Desires to quit smoking but finds it difficult.  Pertinent  Medical History    has a past medical history of Depression, ETOH abuse, Hepatic steatosis (10/30/2021), Seizure (HCC), Subarachnoid hemorrhage (HCC) (07/28/2017), and Tobacco abuse (10/27/2021).   Significant Hospital Events: Including procedures, antibiotic start and stop dates in addition to other pertinent events   1/11 admit  Interim History / Subjective:   No complaints. States that breathing is improved.  No more episodes of hemoptysis.  Objective    Blood pressure (!) 141/98, pulse 87, temperature 98.2 F (36.8 C), temperature source Oral, resp. rate 18, height 5' 8 (1.727 m), weight 68.5 kg, SpO2 (!) 89%.    FiO2 (%):  [21 %] 21 %   Intake/Output Summary (Last 24 hours) at 05/18/2024 0945 Last data filed at 05/18/2024 0820 Gross per 24 hour  Intake 240 ml  Output --  Net 240 ml   Filed Weights   05/16/24 0300 05/16/24 1519  Weight: 68 kg 68.5 kg     Examination: Chronically ill-appearing, disheveled Heart rate regular rate and rhythm Lungs with mild expiratory wheeze Extremities with no edema  CT chest on admission with interval increase in size of spiculated right lower lobe mass with cavitation.  Prominent right hilar, mediastinal lymph node. 0.5 cm right middle lobe pulmonary nodule.  Resolved problem list   Assessment and Plan  Cavitary pneumonia with hemoptysis Cavitary pneumonia with hemoptysis, initially diagnosed in October, with recent increase in cavity size and onset of hemoptysis over the past two days. Despite adherence to a six-week course of Augmentin , the condition has worsened. Differential diagnosis includes cancer, abscess, fungal infection, or atypical infection.  - Scheduled bronchoscopy with navigation to evaluate the cavity, on Friday 1/16 - We would like to get this done before discharge as patient is homeless and cannot keep follow-up appointments. - Continue current antibiotic therapy with Zosyn   Chronic obstructive pulmonary disease with acute exacerbation COPD exacerbation with ongoing wheezing. Currently receiving treatment with nebulizers and antibiotics. - Continue nebulizer treatments, add Brovana , Pulmicort , Yupelri - IV Solu-Medrol  40 mg Q12  Tobacco use disorder Long-standing tobacco use disorder with current smoking of approximately one and a half packs per day. Smoking cessation is critical for improving respiratory health. - Advised smoking cessation.  Signature:   Aldora Perman MD [facility] See Amion for pager  If no response to pager , please call [phone] until 7pm After 7:00 pm call Elink  [phone] 05/18/2024, 9:45 AM         "

## 2024-05-18 NOTE — Progress Notes (Signed)
 Pt scheduled for nav bronch at [facility] on Friday. [facility] arrival time 0930. Bedside RN notified.  Clotilda Schmitz, RN

## 2024-05-19 ENCOUNTER — Telehealth: Payer: Self-pay | Admitting: Pulmonary Disease

## 2024-05-19 DIAGNOSIS — R042 Hemoptysis: Secondary | ICD-10-CM | POA: Diagnosis not present

## 2024-05-19 DIAGNOSIS — Z720 Tobacco use: Secondary | ICD-10-CM | POA: Diagnosis not present

## 2024-05-19 DIAGNOSIS — J441 Chronic obstructive pulmonary disease with (acute) exacerbation: Secondary | ICD-10-CM | POA: Diagnosis not present

## 2024-05-19 DIAGNOSIS — R918 Other nonspecific abnormal finding of lung field: Secondary | ICD-10-CM | POA: Diagnosis not present

## 2024-05-19 MED ORDER — METHYLPREDNISOLONE SODIUM SUCC 40 MG IJ SOLR
40.0000 mg | Freq: Every day | INTRAMUSCULAR | Status: DC
  Administered 2024-05-20: 40 mg via INTRAVENOUS
  Filled 2024-05-19: qty 1

## 2024-05-19 MED ORDER — IPRATROPIUM-ALBUTEROL 0.5-2.5 (3) MG/3ML IN SOLN
3.0000 mL | Freq: Two times a day (BID) | RESPIRATORY_TRACT | Status: DC
  Administered 2024-05-19 – 2024-05-20 (×2): 3 mL via RESPIRATORY_TRACT
  Filled 2024-05-19: qty 3

## 2024-05-19 NOTE — Progress Notes (Signed)
" ° °  NAME:  Gary Mclaughlin, MRN:  [id number], DOB:  Dec 02, 1970, LOS: 3 ADMISSION DATE:  05/15/2024, CONSULTATION DATE: 05/16/2024 REFERRING MD: CANDIE Pore MD, CHIEF COMPLAINT: Cavitary lung lesion  History of Present Illness:   54 year old with alcohol  abuse, COPD, smoker presenting with hemoptysis, COPD exacerbation and worsening cavitary pneumonia.  Hemoptysis - Hemoptysis present for two days. - Sputum initially bright green, then clear, now blood tinged. - No improvement in symptoms despite six-week course of Augmentin  twice daily, taken as prescribed.  Cavitary pneumonia - Cavitary pneumonia diagnosed during admission on December 9th for COPD exacerbation, EtOH intoxication - Discharged on 6 weeks of Augmentin  and per patient he has been taking the medication without missing a dose.. - No clinical improvement after prolonged antibiotic therapy.  Tobacco use - Smokes 1.5 packs of cigarettes daily. - Smoking since age 69. - Desires to quit smoking but finds it difficult.  Pertinent  Medical History    has a past medical history of Depression, ETOH abuse, Hepatic steatosis (10/30/2021), Seizure (HCC), Subarachnoid hemorrhage (HCC) (07/28/2017), and Tobacco abuse (10/27/2021).   Significant Hospital Events: Including procedures, antibiotic start and stop dates in addition to other pertinent events   1/11 admit  Interim History / Subjective:   Breathing is improved.  No new complaint  Objective    Blood pressure (!) 135/91, pulse 86, temperature 98.4 F (36.9 C), temperature source Oral, resp. rate 18, height 5' 8 (1.727 m), weight 68.5 kg, SpO2 93%.    FiO2 (%):  [21 %] 21 %   Intake/Output Summary (Last 24 hours) at 05/19/2024 1132 Last data filed at 05/19/2024 0849 Gross per 24 hour  Intake 480 ml  Output --  Net 480 ml   Filed Weights   05/16/24 0300 05/16/24 1519  Weight: 68 kg 68.5 kg    Examination: Chronically ill-appearing, disheveled Heart rate  regular rate and rhythm Lungs are clear with no wheeze Extremities with no edema  CT chest on admission with interval increase in size of spiculated right lower lobe mass with cavitation.  Prominent right hilar, mediastinal lymph node. 0.5 cm right middle lobe pulmonary nodule.  Resolved problem list   Assessment and Plan  Cavitary pneumonia with hemoptysis Cavitary pneumonia with hemoptysis, initially diagnosed in October, with recent increase in cavity size and onset of hemoptysis over the past two days. Despite adherence to a six-week course of Augmentin , the condition has worsened. Differential diagnosis includes cancer, abscess, fungal infection, or atypical infection.  - Scheduled bronchoscopy with navigation to evaluate the cavity, on Friday 1/16 - Continue current antibiotic therapy with Zosyn .    Chronic obstructive pulmonary disease with acute exacerbation COPD exacerbation with ongoing wheezing. Currently receiving treatment with nebulizers and antibiotics. - Continue nebulizer treatments, add Brovana , Pulmicort , Yupelri - Reduce Solu-Medrol  dose to 40 mg daily.  Can switch to prednisone  with taper on discharge  Tobacco use disorder Long-standing tobacco use disorder with current smoking of approximately one and a half packs per day. Smoking cessation is critical for improving respiratory health. - Advised smoking cessation.  Will make follow-up in pulmonary clinic in 2 weeks after procedure.  Signature:   Jedi Catalfamo MD [facility] See Amion for pager  If no response to pager , please call [phone] until 7pm After 7:00 pm call Elink  [phone] 05/19/2024, 11:32 AM         "

## 2024-05-19 NOTE — H&P (View-Only) (Signed)
" ° °  NAME:  Gary Mclaughlin, MRN:  [id number], DOB:  Dec 02, 1970, LOS: 3 ADMISSION DATE:  05/15/2024, CONSULTATION DATE: 05/16/2024 REFERRING MD: CANDIE Pore MD, CHIEF COMPLAINT: Cavitary lung lesion  History of Present Illness:   54 year old with alcohol  abuse, COPD, smoker presenting with hemoptysis, COPD exacerbation and worsening cavitary pneumonia.  Hemoptysis - Hemoptysis present for two days. - Sputum initially bright green, then clear, now blood tinged. - No improvement in symptoms despite six-week course of Augmentin  twice daily, taken as prescribed.  Cavitary pneumonia - Cavitary pneumonia diagnosed during admission on December 9th for COPD exacerbation, EtOH intoxication - Discharged on 6 weeks of Augmentin  and per patient he has been taking the medication without missing a dose.. - No clinical improvement after prolonged antibiotic therapy.  Tobacco use - Smokes 1.5 packs of cigarettes daily. - Smoking since age 69. - Desires to quit smoking but finds it difficult.  Pertinent  Medical History    has a past medical history of Depression, ETOH abuse, Hepatic steatosis (10/30/2021), Seizure (HCC), Subarachnoid hemorrhage (HCC) (07/28/2017), and Tobacco abuse (10/27/2021).   Significant Hospital Events: Including procedures, antibiotic start and stop dates in addition to other pertinent events   1/11 admit  Interim History / Subjective:   Breathing is improved.  No new complaint  Objective    Blood pressure (!) 135/91, pulse 86, temperature 98.4 F (36.9 C), temperature source Oral, resp. rate 18, height 5' 8 (1.727 m), weight 68.5 kg, SpO2 93%.    FiO2 (%):  [21 %] 21 %   Intake/Output Summary (Last 24 hours) at 05/19/2024 1132 Last data filed at 05/19/2024 0849 Gross per 24 hour  Intake 480 ml  Output --  Net 480 ml   Filed Weights   05/16/24 0300 05/16/24 1519  Weight: 68 kg 68.5 kg    Examination: Chronically ill-appearing, disheveled Heart rate  regular rate and rhythm Lungs are clear with no wheeze Extremities with no edema  CT chest on admission with interval increase in size of spiculated right lower lobe mass with cavitation.  Prominent right hilar, mediastinal lymph node. 0.5 cm right middle lobe pulmonary nodule.  Resolved problem list   Assessment and Plan  Cavitary pneumonia with hemoptysis Cavitary pneumonia with hemoptysis, initially diagnosed in October, with recent increase in cavity size and onset of hemoptysis over the past two days. Despite adherence to a six-week course of Augmentin , the condition has worsened. Differential diagnosis includes cancer, abscess, fungal infection, or atypical infection.  - Scheduled bronchoscopy with navigation to evaluate the cavity, on Friday 1/16 - Continue current antibiotic therapy with Zosyn .    Chronic obstructive pulmonary disease with acute exacerbation COPD exacerbation with ongoing wheezing. Currently receiving treatment with nebulizers and antibiotics. - Continue nebulizer treatments, add Brovana , Pulmicort , Yupelri - Reduce Solu-Medrol  dose to 40 mg daily.  Can switch to prednisone  with taper on discharge  Tobacco use disorder Long-standing tobacco use disorder with current smoking of approximately one and a half packs per day. Smoking cessation is critical for improving respiratory health. - Advised smoking cessation.  Will make follow-up in pulmonary clinic in 2 weeks after procedure.  Signature:   Jedi Catalfamo MD [facility] See Amion for pager  If no response to pager , please call [phone] until 7pm After 7:00 pm call Elink  [phone] 05/19/2024, 11:32 AM         "

## 2024-05-19 NOTE — Progress Notes (Signed)
 " Progress Note   Patient: Gary Mclaughlin FMW:[id number] DOB: 03/01/1971 DOA: 05/15/2024     3 DOS: the patient was seen and examined on 05/19/2024   Brief hospital course: Gary Mclaughlin is a 54 y.o. male with history of COPD, alcohol  abuse recently admitted for cavitary pneumonia in the setting of COPD exacerbation was discharged on Augmentin  and has been still taking Augmentin  presents to the ER with coughing up blood last 2 days.  CT scan shows interval increase in size of spiculated right lower lobe mass associated with cavitation and surrounding satellite micronodules.  Findings concerning for malignancy.  Patient is started on broad-spectrum antibiotics admitted to hospitalist service.  Pulmonology consulted. Bronch planned for tomorrow.  Assessment and Plan: Cavitary pneumonia Hemoptysis Rule out malignancy- Continue Zosyn , Pulmonology follow up appreciated-plan for bronchoscopy tomorrow. Continue bronchodilator therapy.  COPD exacerbation: decreased IV Solu-Medrol  40 mg q12 to daily. Continue nebulizer treatments. Pulmonology added Brovana , Pulmicort  and Yupelri.  Tobacco abuse: Discussed with him regarding ill effects of tobacco. Smoking cessation counseled. Time spent on smoking cessation 5 minutes.  Alcohol  abuse: States he quit drinking more than a month ago. Ativan  as needed for agitation.  Homeless: Patient will need TOC evaluation for discharge planning.    Out of bed to chair. Incentive spirometry. Nursing supportive care. Fall, aspiration precautions. Diet:  Diet Orders (From admission, onward)     Start     Ordered   05/20/24 0001  Diet NPO time specified  Diet effective midnight        05/19/24 1024   05/16/24 0053  Diet regular Room service appropriate? Yes; Fluid consistency: Thin  Diet effective now       Question Answer Comment  Room service appropriate? Yes   Fluid consistency: Thin      05/16/24 0052           DVT prophylaxis: SCDs Start:  05/16/24 0053  Level of care: Telemetry   Code Status: Full Code  Subjective: Patient is seen and examined today morning.  He is lying comfortably.  Feels better. Walking in hall way. Off supplemental O2.  Physical Exam: Vitals:   05/18/24 1415 05/18/24 2053 05/19/24 0425 05/19/24 1313  BP:  (!) 159/97 (!) 135/91 (!) 166/99  Pulse:  94 86 (!) 105  Resp:  19 18 17   Temp:  98.3 F (36.8 C) 98.4 F (36.9 C) 98.3 F (36.8 C)  TempSrc:  Oral Oral Oral  SpO2: (!) 89%  93% 93%  Weight:      Height:        General - Middle aged Caucasian male, no apparent distress HEENT - PERRLA, EOMI, atraumatic head, non tender sinuses. Lung - Clear, basal rales, rhonchi, diffuse wheezes. Heart - S1, S2 heard, no murmurs, rubs, no pedal edema. Abdomen - Soft, non tender, bowel sounds good Neuro - Alert, awake and oriented x 3, non focal exam. Skin - Warm and dry.  Data Reviewed:      Latest Ref Rng & Units 05/16/2024    4:30 AM 05/15/2024    4:58 PM 04/17/2024    5:18 AM  CBC  WBC 4.0 - 10.5 K/uL 8.8  10.1  7.9   Hemoglobin 13.0 - 17.0 g/dL 87.3  87.6  88.4   Hematocrit 39.0 - 52.0 % 38.5  38.3  35.4   Platelets 150 - 400 K/uL 317  370  266       Latest Ref Rng & Units 05/16/2024    4:30 AM 05/15/2024  4:58 PM 04/17/2024    5:18 AM  BMP  Glucose 70 - 99 mg/dL 831  97  99   BUN 6 - 20 mg/dL 11  16  21    Creatinine 0.61 - 1.24 mg/dL 9.37  9.21  9.39   Sodium 135 - 145 mmol/L 135  138  134   Potassium 3.5 - 5.1 mmol/L 4.6  4.7  4.2   Chloride 98 - 111 mmol/L 106  102  103   CO2 22 - 32 mmol/L 20  26  23    Calcium 8.9 - 10.3 mg/dL 9.8  89.5  89.9    No results found.   Family Communication: Discussed with patient, he understand and agree. All questions answered.  Disposition: Status is: Inpatient Remains inpatient appropriate because: IV antibiotics, steroids, bronchoscopy tomorrow.  Planned Discharge Destination: Home     Time spent: 45 minutes  Author: Concepcion Riser, MD 05/19/2024 3:29 PM Secure chat 7am to 7pm For on call review [email or web address].    "

## 2024-05-19 NOTE — Progress Notes (Signed)
" ° ° °  PROCEDURAL EXPEDITER PROGRESS NOTE  [name]  DOB:1970-10-12 Date of Admission: 05/15/2024  Date of Assessment:05/19/24   -------------------------------------------------------------------------------------------------------------------   Brief clinical summary: Pt to Endo tomorrow for Navigation Bronchoscopy  Orders in place:  Yes   Labs, test, and orders reviewed: Y  Requires surgical clearance:  No  Barriers noted: N/A   -------------------------------------------------------------------------------------------------------------------  [facility], [place], [place] Please contact us  directly via secure chat (search for [facility]) or by calling us  at [phone] [facility]).  "

## 2024-05-19 NOTE — Telephone Encounter (Signed)
 Please make post bronchoscopy follow-up in 2 weeks with Sarah Groce or Dr. Alghanim

## 2024-05-20 ENCOUNTER — Encounter (HOSPITAL_COMMUNITY): Payer: Self-pay | Admitting: Internal Medicine

## 2024-05-20 ENCOUNTER — Encounter (HOSPITAL_COMMUNITY): Admission: EM | Disposition: A | Payer: Self-pay | Source: Home / Self Care | Attending: Internal Medicine

## 2024-05-20 ENCOUNTER — Telehealth: Payer: Self-pay

## 2024-05-20 ENCOUNTER — Inpatient Hospital Stay (HOSPITAL_COMMUNITY): Payer: MEDICAID

## 2024-05-20 ENCOUNTER — Inpatient Hospital Stay (HOSPITAL_COMMUNITY): Payer: MEDICAID | Admitting: Anesthesiology

## 2024-05-20 ENCOUNTER — Other Ambulatory Visit (HOSPITAL_COMMUNITY): Payer: Self-pay

## 2024-05-20 DIAGNOSIS — F17210 Nicotine dependence, cigarettes, uncomplicated: Secondary | ICD-10-CM

## 2024-05-20 DIAGNOSIS — Z48813 Encounter for surgical aftercare following surgery on the respiratory system: Secondary | ICD-10-CM

## 2024-05-20 DIAGNOSIS — J849 Interstitial pulmonary disease, unspecified: Secondary | ICD-10-CM

## 2024-05-20 DIAGNOSIS — R918 Other nonspecific abnormal finding of lung field: Secondary | ICD-10-CM

## 2024-05-20 DIAGNOSIS — J984 Other disorders of lung: Secondary | ICD-10-CM

## 2024-05-20 DIAGNOSIS — J441 Chronic obstructive pulmonary disease with (acute) exacerbation: Secondary | ICD-10-CM | POA: Diagnosis not present

## 2024-05-20 DIAGNOSIS — J9601 Acute respiratory failure with hypoxia: Secondary | ICD-10-CM

## 2024-05-20 HISTORY — PX: BRONCHIAL NEEDLE ASPIRATION BIOPSY: SHX5106

## 2024-05-20 HISTORY — PX: ENDOBRONCHIAL ULTRASOUND: SHX5096

## 2024-05-20 HISTORY — PX: VIDEO BRONCHOSCOPY WITH ENDOBRONCHIAL NAVIGATION: SHX6175

## 2024-05-20 HISTORY — PX: VIDEO BRONCHOSCOPY WITH RADIAL ENDOBRONCHIAL ULTRASOUND: SHX6849

## 2024-05-20 LAB — PROTIME-INR
INR: 0.9 (ref 0.8–1.2)
Prothrombin Time: 12.7 s (ref 11.4–15.2)

## 2024-05-20 MED ORDER — FENTANYL CITRATE (PF) 100 MCG/2ML IJ SOLN
INTRAMUSCULAR | Status: DC | PRN
  Administered 2024-05-20 (×2): 25 ug via INTRAVENOUS

## 2024-05-20 MED ORDER — BUDESONIDE-FORMOTEROL FUMARATE 160-4.5 MCG/ACT IN AERO
2.0000 | INHALATION_SPRAY | Freq: Two times a day (BID) | RESPIRATORY_TRACT | 1 refills | Status: DC
  Filled 2024-05-20: qty 10.2, 30d supply, fill #0

## 2024-05-20 MED ORDER — SODIUM CHLORIDE 0.9 % IV SOLN: Freq: Once | INTRAVENOUS | Status: DC

## 2024-05-20 MED ORDER — PROPOFOL 10 MG/ML IV BOLUS
INTRAVENOUS | Status: DC | PRN
  Administered 2024-05-20: 200 mg via INTRAVENOUS
  Administered 2024-05-20: 150 ug/kg/min via INTRAVENOUS

## 2024-05-20 MED ORDER — AMISULPRIDE (ANTIEMETIC) 5 MG/2ML IV SOLN: 10.0000 mg | Freq: Once | INTRAVENOUS | Status: DC | PRN

## 2024-05-20 MED ORDER — PIPERACILLIN-TAZOBACTAM 3.375 G IVPB: 3.3750 g | Freq: Three times a day (TID) | INTRAVENOUS | Status: DC

## 2024-05-20 MED ORDER — PHENYLEPHRINE HCL-NACL 20-0.9 MG/250ML-% IV SOLN
INTRAVENOUS | Status: DC | PRN
  Administered 2024-05-20: 20 ug/min via INTRAVENOUS

## 2024-05-20 MED ORDER — MIDAZOLAM HCL 2 MG/2ML IJ SOLN
INTRAMUSCULAR | Status: AC
  Filled 2024-05-20: qty 2

## 2024-05-20 MED ORDER — MIDAZOLAM HCL (PF) 2 MG/2ML IJ SOLN
INTRAMUSCULAR | Status: DC | PRN
  Administered 2024-05-20: 2 mg via INTRAVENOUS

## 2024-05-20 MED ORDER — LIDOCAINE 2% (20 MG/ML) 5 ML SYRINGE
INTRAMUSCULAR | Status: DC | PRN
  Administered 2024-05-20: 60 mg via INTRAVENOUS

## 2024-05-20 MED ORDER — PREDNISONE 10 MG PO TABS: ORAL_TABLET | ORAL | 0 refills | Status: AC

## 2024-05-20 MED ORDER — FENTANYL CITRATE (PF) 100 MCG/2ML IJ SOLN
INTRAMUSCULAR | Status: AC
  Filled 2024-05-20: qty 2

## 2024-05-20 MED ORDER — LACTATED RINGERS IV SOLN
INTRAVENOUS | Status: AC | PRN
  Administered 2024-05-20: 1000 mL via INTRAVENOUS

## 2024-05-20 MED ORDER — DEXAMETHASONE SOD PHOSPHATE PF 10 MG/ML IJ SOLN
INTRAMUSCULAR | Status: DC | PRN
  Administered 2024-05-20: 10 mg via INTRAVENOUS

## 2024-05-20 MED ORDER — SODIUM CHLORIDE 0.9 % IV SOLN: 12.5000 mg | INTRAVENOUS | Status: DC | PRN

## 2024-05-20 MED ORDER — ONDANSETRON HCL 4 MG/2ML IJ SOLN
INTRAMUSCULAR | Status: DC | PRN
  Administered 2024-05-20: 4 mg via INTRAVENOUS

## 2024-05-20 MED ORDER — SUGAMMADEX SODIUM 200 MG/2ML IV SOLN
INTRAVENOUS | Status: DC | PRN
  Administered 2024-05-20: 200 mg via INTRAVENOUS

## 2024-05-20 MED ORDER — STIOLTO RESPIMAT 2.5-2.5 MCG/ACT IN AERS: 2.0000 | INHALATION_SPRAY | Freq: Every day | RESPIRATORY_TRACT | 1 refills | Status: AC

## 2024-05-20 MED ORDER — ROCURONIUM BROMIDE 10 MG/ML (PF) SYRINGE
PREFILLED_SYRINGE | INTRAVENOUS | Status: DC | PRN
  Administered 2024-05-20: 30 mg via INTRAVENOUS
  Administered 2024-05-20: 20 mg via INTRAVENOUS
  Administered 2024-05-20: 50 mg via INTRAVENOUS

## 2024-05-20 NOTE — Anesthesia Procedure Notes (Signed)
 Procedure Name: Intubation Date/Time: 05/20/2024 1:32 PM  Performed by: Harrold Macintosh, CRNAPre-anesthesia Checklist: Patient identified, Emergency Drugs available, Suction available and Patient being monitored Patient Re-evaluated:Patient Re-evaluated prior to induction Oxygen Delivery Method: Circle system utilized Preoxygenation: Pre-oxygenation with 100% oxygen Induction Type: IV induction Ventilation: Mask ventilation without difficulty Laryngoscope Size: Miller and 2 Grade View: Grade I Tube type: Oral Tube size: 8.5 mm Number of attempts: 1 Airway Equipment and Method: Stylet Placement Confirmation: ETT inserted through vocal cords under direct vision, positive ETCO2 and breath sounds checked- equal and bilateral Secured at: 22 cm Tube secured with: Tape Dental Injury: Teeth and Oropharynx as per pre-operative assessment

## 2024-05-20 NOTE — Anesthesia Preprocedure Evaluation (Addendum)
"                                    Anesthesia Evaluation  Patient identified by MRN, date of birth, ID band Patient awake    Reviewed: Allergy & Precautions, NPO status , Patient's Chart, lab work & pertinent test results  History of Anesthesia Complications Negative for: history of anesthetic complications  Airway Mallampati: I  TM Distance: >3 FB Neck ROM: Full    Dental  (+) Dental Advisory Given, Missing   Pulmonary pneumonia, COPD,  COPD inhaler, Current Smoker and Patient abstained from smoking.  Spiculated lung mass    Pulmonary exam normal        Cardiovascular negative cardio ROS Normal cardiovascular exam     Neuro/Psych Seizures - (related to EtOH withdrawals), Well Controlled,  PSYCHIATRIC DISORDERS  Depression     Hx SI Hx SAH     GI/Hepatic negative GI ROS,,,(+)     substance abuse  alcohol  use  Endo/Other  negative endocrine ROS    Renal/GU negative Renal ROS     Musculoskeletal negative musculoskeletal ROS (+)    Abdominal   Peds  Hematology  (+) Blood dyscrasia, anemia   Anesthesia Other Findings   Reproductive/Obstetrics                              Anesthesia Physical Anesthesia Plan  ASA: 3  Anesthesia Plan: General   Post-op Pain Management: Minimal or no pain anticipated   Induction: Intravenous  PONV Risk Score and Plan: 1 and Treatment may vary due to age or medical condition, Ondansetron , Dexamethasone  and Midazolam   Airway Management Planned: Oral ETT  Additional Equipment: None  Intra-op Plan:   Post-operative Plan: Extubation in OR  Informed Consent: I have reviewed the patients History and Physical, chart, labs and discussed the procedure including the risks, benefits and alternatives for the proposed anesthesia with the patient or authorized representative who has indicated his/her understanding and acceptance.     Dental advisory given  Plan Discussed with: CRNA  and Anesthesiologist  Anesthesia Plan Comments:          Anesthesia Quick Evaluation  "

## 2024-05-20 NOTE — Telephone Encounter (Signed)
 Ordering PET scan due to concerning of malignancy.

## 2024-05-20 NOTE — Telephone Encounter (Signed)
 PT scheduled

## 2024-05-20 NOTE — Anesthesia Postprocedure Evaluation (Signed)
"   Anesthesia Post Note  Patient: Gary Mclaughlin  Procedure(s) Performed: VIDEO BRONCHOSCOPY WITH ENDOBRONCHIAL NAVIGATION (Right) ENDOBRONCHIAL ULTRASOUND (EBUS) (Bilateral) VIDEO BRONCHOSCOPY WITH RADIAL ENDOBRONCHIAL ULTRASOUND BRONCHOSCOPY, WITH NEEDLE ASPIRATION BIOPSY     Patient location during evaluation: PACU Anesthesia Type: General Level of consciousness: awake and alert Pain management: pain level controlled Vital Signs Assessment: post-procedure vital signs reviewed and stable Respiratory status: spontaneous breathing, nonlabored ventilation, respiratory function stable and patient connected to nasal cannula oxygen Cardiovascular status: blood pressure returned to baseline and stable Postop Assessment: no apparent nausea or vomiting Anesthetic complications: no   No notable events documented.  Last Vitals:  Vitals:   05/20/24 1520 05/20/24 1530  BP: 106/76 101/69  Pulse: 83 88  Resp: (!) 25 20  Temp:    SpO2: 95% 93%    Last Pain:  Vitals:   05/20/24 1520  TempSrc:   PainSc: 3                  Debby FORBES Like      "

## 2024-05-20 NOTE — Op Note (Signed)
 Video Bronchoscopy with Robotic Assisted Bronchoscopic Navigation   Date of Operation: 05/20/2024   Pre-op Diagnosis: RLL lung mass  Post-op Diagnosis: same  Surgeon: Paula Southerly, MD  Assistants: N/A  Anesthesia: General endotracheal anesthesia  Operation: Flexible video fiberoptic bronchoscopy with robotic assistance and biopsies.  Estimated Blood Loss: Minimal  Complications: None  Indications and History: Gary Mclaughlin is a 54 y.o. male with history of COPD and RLL Mass.  Recommendation made to achieve a tissue diagnosis via robotic assisted navigational bronchoscopy.  The risks, benefits, complications, treatment options and expected outcomes were discussed with the patient.  The possibilities of pneumothorax, pneumonia, reaction to medication, pulmonary aspiration, perforation of a viscus, bleeding, failure to diagnose a condition and creating a complication requiring transfusion or operation were discussed with the patient who freely signed the consent.    Description of Procedure: The patient was seen in the Preoperative Area, was examined and was deemed appropriate to proceed.  The patient was taken to [facility] room 3, identified as Gary Mclaughlin and the procedure verified as Flexible Video Fiberoptic Bronchoscopy.  A Time Out was held and the above information confirmed.   Prior to the date of the procedure a high-resolution CT scan of the chest was performed. Utilizing ION software program a virtual tracheobronchial tree was generated to allow the creation of distinct navigation pathways to the patient's parenchymal abnormalities. After being taken to the operating room general anesthesia was initiated and the patient  was orally intubated. The video fiberoptic bronchoscope was introduced via the endotracheal tube and a general inspection was performed which showed normal right and left lung anatomy. Aspiration of the bilateral mainstems was completed to remove any  remaining secretions. Robotic catheter inserted into patient's endotracheal tube.   Target #1 - RLL Mass: The distinct navigation pathways prepared prior to this procedure were then utilized to navigate to patient's lesion identified on CT scan. The robotic catheter was secured into place and the vision probe was withdrawn.  Lesion location was approximated using fluoroscopy.  Local registration and targeting was performed using Siemens [employer] Cios mobile C-arm three-dimensional imaging. Under fluoroscopic guidance transbronchial needle biopsies, and transbronchial cryoprobe biopsies were performed to be sent for cytology and pathology.  Due to technical difficulties with CIOS cone beam CT. We were unable to do a cone beam in procedure and get needle in lesion view. However, I utilized radial EBUS and had a concentric view. Also, ROSE was adequate on the needle and touch prep. A bronchioalveolar lavage was performed in the RLL superior segment and sent for microbiology.  EBUS scope was utilized to evaluate for enlarged lymph nodes. 11 L, 4 L, 4R, 7, 11R were all < 5 mm and too small to biopsy. No transbronchial needle aspiration performed.  At the end of the procedure a general airway inspection was performed and there was no evidence of active bleeding. The bronchoscope was removed.  The patient tolerated the procedure well. There was no significant blood loss and there were no obvious complications. A post-procedural chest x-ray is pending.  Samples Target #1: 1. Transbronchial Wang needle biopsies from RLL mass 2. Transbronchial cryoprobe biopsies from RLL mass 3. Bronchoalveolar lavage from RLL mass  Plans:  The patient will be discharged from the PACU to home when recovered from anesthesia and after chest x-ray is reviewed. We will review the cytology, pathology and microbiology results with the patient when they become available. He will then return to [facility] to continue  hospitalization. The patient will need pulmonology and oncology follow up on discharge.  Paula Southerly, MD [facility]

## 2024-05-20 NOTE — Discharge Summary (Signed)
 " Physician Discharge Summary   Patient: Gary Mclaughlin MRN: [id number] DOB: January 06, 1971  Admit date:     05/15/2024  Discharge date: {dischdate:26783}  Discharge Physician: Concepcion Riser   PCP: Pcp, No   Recommendations at discharge:  {Tip this will not be part of the note when signed- Example include specific recommendations for outpatient follow-up, pending tests to follow-up on. (Optional):26781}  ***  Discharge Diagnoses: Principal Problem:   COPD with acute exacerbation (HCC) Active Problems:   Anemia   Tobacco abuse   Alcohol  use disorder   Lung mass   Hemoptysis  Resolved Problems:   * No resolved hospital problems. [facility] Course: No notes on file  Assessment and Plan: No notes have been filed under this hospital service. Service: Hospitalist     {Tip this will not be part of the note when signed Body mass index is 22.96 kg/m. , ,  (Optional):26781}  {(NOTE) Pain control PDMP Statment (Optional):26782} Consultants: *** Procedures performed: ***  Disposition: {Plan; Disposition:26390} Diet recommendation:  Discharge Diet Orders (From admission, onward)     Start     Ordered   05/20/24 0000  Diet - low sodium heart healthy        05/20/24 1711           {Diet_Plan:26776} DISCHARGE MEDICATION: Allergies as of 05/20/2024   No Known Allergies      Medication List     STOP taking these medications    folic acid  1 MG tablet Commonly known as: FOLVITE    gabapentin  100 MG capsule Commonly known as: NEURONTIN    guaiFENesin  600 MG 12 hr tablet Commonly known as: MUCINEX    thiamine  100 MG tablet Commonly known as: Vitamin B-1       TAKE these medications    albuterol  108 (90 Base) MCG/ACT inhaler Commonly known as: VENTOLIN  HFA Inhale 2 puffs into the lungs every 6 (six) hours as needed for wheezing or shortness of breath.   amoxicillin -clavulanate 875-125 MG tablet Commonly known as: AUGMENTIN  Take 1 tablet by mouth every  12 (twelve) hours.   budesonide -formoterol  160-4.5 MCG/ACT inhaler Commonly known as: SYMBICORT  Inhale 2 puffs into the lungs 2 (two) times daily.   cetirizine  10 MG tablet Commonly known as: ZyrTEC  Allergy Take 1 tablet (10 mg total) by mouth daily.   hydrocortisone  cream 0.5 % Apply topically as needed for itching.   multivitamin with minerals Tabs tablet Take 1 tablet by mouth daily.   nicotine  14 mg/24hr patch Commonly known as: NICODERM CQ  - dosed in mg/24 hours Place 1 patch (14 mg total) onto the skin daily.   polyethylene glycol 17 g packet Commonly known as: MIRALAX  / GLYCOLAX  Take 17 g by mouth 2 (two) times daily.               Discharge Care Instructions  (From admission, onward)           Start     Ordered   05/20/24 0000  No dressing needed        05/20/24 1711            Discharge Exam: Filed Weights   05/16/24 0300 05/16/24 1519  Weight: 68 kg 68.5 kg   ***  Condition at discharge: {DC Condition:26389}  The results of significant diagnostics from this hospitalization (including imaging, microbiology, ancillary and laboratory) are listed below for reference.   Imaging Studies: DG Chest Port 1 View Result Date: 05/20/2024 CLINICAL DATA:  Status post bronchoscopy EXAM: PORTABLE CHEST  1 VIEW COMPARISON:  05/15/2024 chest x-ray and CT FINDINGS: Multiple old right greater than left rib fractures. No focal opacity, pleural effusion, or pneumothorax. Normal cardiac size. Cavitary process in the right lower lobe on CT is not well seen radiographically IMPRESSION: No pneumothorax. Multiple old rib fractures. Cavitary process in the right lower lobe on CT is not well seen radiographically. Electronically Signed   By: Luke Bun M.D.   On: 05/20/2024 15:37   DG C-ARM BRONCHOSCOPY Result Date: 05/20/2024 C-ARM BRONCHOSCOPY: Fluoroscopy was utilized by the requesting physician.  No radiographic interpretation.   DG C-Arm 1-60 Min-No  Report Result Date: 05/20/2024 Fluoroscopy was utilized by the requesting physician.  No radiographic interpretation.   CT Chest W Contrast Result Date: 05/15/2024 EXAM: CT CHEST WITH CONTRAST 05/15/2024 09:53:58 PM TECHNIQUE: CT of the chest was performed with the administration of 75 mL of iohexol  (OMNIPAQUE ) 300 MG/ML solution. Multiplanar reformatted images are provided for review. Automated exposure control, iterative reconstruction, and/or weight based adjustment of the mA/kV was utilized to reduce the radiation dose to as low as reasonably achievable. COMPARISON: Chest x-ray 05/15/2024, CT chest 03/04/2024, CT angio chest 04/04/2024. CLINICAL HISTORY: Abnormal xray - lung nodule, >= 1 cm. FINDINGS: MEDIASTINUM: Heart and pericardium are unremarkable. The central airways are clear. The thoracic aorta is normal in caliber. The main pulmonary artery is normal in caliber. LYMPH NODES: Prominent but not enlarged right hilar lymph nodes. Prominent but nonenlarged mediastinal lymph nodes. No axillary lymphadenopathy. LUNGS AND PLEURA: Increase in size of a spiculated 4.2 x 2.9 cm (from 2.3 x 1.9 cm) right lower lobe mass with associated cavitation. Associated surrounding satellite micronodules. Azygous fissure noted. Stable right middle lobe 0.5 x 0.2 cm pulmonary nodule. No pleural effusion or pneumothorax. SOFT TISSUES/BONES: Stable L1 compression fracture. 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 Bilateral gynecomastia. UPPER ABDOMEN: Limited images of the upper abdomen demonstrates no acute abnormality. IMPRESSION: 1. Interval increase in size of a spiculated 4.2 x 2.9 cm (from 2.3 x 1.9 cm) right lower lobe mass with associated cavitation and surrounding satellite micronodules. Finding may represent versus malignancy. 2. Prominent but not enlarged right hilar and mediastinal lymph nodes. 3. Stable 0.5 x 0.2 cm right middle lobe pulmonary nodule. Electronically signed by: Morgane Naveau MD MD 05/15/2024  10:10 PM EST RP Workstation: [id number]   DG Chest 2 View Result Date: 05/15/2024 CLINICAL DATA:  Cough. EXAM: CHEST - 2 VIEW COMPARISON:  April 13, 2024 FINDINGS: The heart size and mediastinal contours are within normal limits. No acute infiltrate, pleural effusion or pneumothorax is identified. Multiple chronic right-sided rib fractures are seen. The visualized skeletal structures are otherwise unremarkable. IMPRESSION: No active cardiopulmonary disease. Electronically Signed   By: Suzen Dials M.D.   On: 05/15/2024 16:59    Microbiology: Results for orders placed or performed during the hospital encounter of 05/15/24  Resp panel by RT-PCR (RSV, Flu A&B, Covid) Anterior Nasal Swab     Status: None   Collection Time: 05/16/24  4:30 AM   Specimen: Anterior Nasal Swab  Result Value Ref Range Status   SARS Coronavirus 2 by RT PCR NEGATIVE NEGATIVE Final    Comment: (NOTE) SARS-CoV-2 target nucleic acids are NOT DETECTED.  The SARS-CoV-2 RNA is generally detectable in upper respiratory specimens during the acute phase of infection. The lowest concentration of SARS-CoV-2 viral copies this assay can detect is 138 copies/mL. A negative result does not preclude SARS-Cov-2 infection and should not be used as the sole basis  for treatment or other patient management decisions. A negative result may occur with  improper specimen collection/handling, submission of specimen other than nasopharyngeal swab, presence of viral mutation(s) within the areas targeted by this assay, and inadequate number of viral copies(<138 copies/mL). A negative result must be combined with clinical observations, patient history, and epidemiological information. The expected result is Negative.  Fact Sheet for Patients:  bloggercourse.com  Fact Sheet for Healthcare Providers:  seriousbroker.it  This test is no t yet approved or cleared by the [place]  FDA  and  has been authorized for detection and/or diagnosis of SARS-CoV-2 by FDA under an Emergency Use Authorization (EUA). This EUA will remain  in effect (meaning this test can be used) for the duration of the COVID-19 declaration under Section 564(b)(1) of the Act, 21 U.S.C.section 360bbb-3(b)(1), unless the authorization is terminated  or revoked sooner.       Influenza A by PCR NEGATIVE NEGATIVE Final   Influenza B by PCR NEGATIVE NEGATIVE Final    Comment: (NOTE) The Xpert Xpress SARS-CoV-2/FLU/RSV plus assay is intended as an aid in the diagnosis of influenza from Nasopharyngeal swab specimens and should not be used as a sole basis for treatment. Nasal washings and aspirates are unacceptable for Xpert Xpress SARS-CoV-2/FLU/RSV testing.  Fact Sheet for Patients: bloggercourse.com  Fact Sheet for Healthcare Providers: seriousbroker.it  This test is not yet approved or cleared by the [place]  FDA and has been authorized for detection and/or diagnosis of SARS-CoV-2 by FDA under an Emergency Use Authorization (EUA). This EUA will remain in effect (meaning this test can be used) for the duration of the COVID-19 declaration under Section 564(b)(1) of the Act, 21 U.S.C. section 360bbb-3(b)(1), unless the authorization is terminated or revoked.     Resp Syncytial Virus by PCR NEGATIVE NEGATIVE Final    Comment: (NOTE) Fact Sheet for Patients: bloggercourse.com  Fact Sheet for Healthcare Providers: seriousbroker.it  This test is not yet approved or cleared by the [place]  FDA and has been authorized for detection and/or diagnosis of SARS-CoV-2 by FDA under an Emergency Use Authorization (EUA). This EUA will remain in effect (meaning this test can be used) for the duration of the COVID-19 declaration under Section 564(b)(1) of the Act, 21 U.S.C. section 360bbb-3(b)(1),  unless the authorization is terminated or revoked.  Performed at [facility], [address], [place], [place] [address]     Labs: CBC: Recent Labs  Lab 05/15/24 1658 05/16/24 0430  WBC 10.1 8.8  NEUTROABS 7.3  --   HGB 12.3* 12.6*  HCT 38.3* 38.5*  MCV 95.3 94.8  PLT 370 317   Basic Metabolic Panel: Recent Labs  Lab 05/15/24 1658 05/16/24 0430  NA 138 135  K 4.7 4.6  CL 102 106  CO2 26 20*  GLUCOSE 97 168*  BUN 16 11  CREATININE 0.78 0.62  CALCIUM 10.4* 9.8   Liver Function Tests: No results for input(s): AST, ALT, ALKPHOS, BILITOT, PROT, ALBUMIN in the last 168 hours. CBG: No results for input(s): GLUCAP in the last 168 hours.  Discharge time spent: {LESS THAN/GREATER UYJW:73611} 30 minutes.  Signed: Concepcion Riser, MD [facility] 05/20/2024 "

## 2024-05-20 NOTE — Interval H&P Note (Signed)
 History and Physical Interval Note:  05/20/2024 12:20 PM  Gary Mclaughlin  has presented today for surgery, with the diagnosis of Cavitary pneumonia.  The various methods of treatment have been discussed with the patient and family. After consideration of risks, benefits and other options for treatment, the patient has consented to  Procedures: VIDEO BRONCHOSCOPY WITH ENDOBRONCHIAL NAVIGATION (Right) ENDOBRONCHIAL ULTRASOUND (EBUS) (Bilateral) as a surgical intervention.  The patient's history has been reviewed, patient examined, no change in status, stable for surgery.  I have reviewed the patient's chart and labs.  Questions were answered to the patient's satisfaction.     Patirica Longshore

## 2024-05-20 NOTE — Transfer of Care (Signed)
 Immediate Anesthesia Transfer of Care Note  Patient: Gary Mclaughlin  Procedure(s) Performed: VIDEO BRONCHOSCOPY WITH ENDOBRONCHIAL NAVIGATION (Right) ENDOBRONCHIAL ULTRASOUND (EBUS) (Bilateral) VIDEO BRONCHOSCOPY WITH RADIAL ENDOBRONCHIAL ULTRASOUND BRONCHOSCOPY, WITH NEEDLE ASPIRATION BIOPSY  Patient Location: Endoscopy Unit  Anesthesia Type:General  Level of Consciousness: awake, alert , oriented, and patient cooperative  Airway & Oxygen Therapy: Patient Spontanous Breathing and Patient connected to nasal cannula oxygen  Post-op Assessment: Report given to RN and Post -op Vital signs reviewed and stable  Post vital signs: Reviewed and stable  Last Vitals:  Vitals Value Taken Time  BP 105/92 05/20/24 15:00  Temp 36.8 C 05/20/24 14:52  Pulse 104 05/20/24 15:01  Resp 16 05/20/24 15:01  SpO2 91 % 05/20/24 15:01  Vitals shown include unfiled device data.  Last Pain:  Vitals:   05/20/24 1452  TempSrc: Oral  PainSc: 0-No pain         Complications: No notable events documented.

## 2024-05-20 NOTE — Progress Notes (Signed)
" ° °  NAME:  Gary Mclaughlin, MRN:  [id number], DOB:  April 23, 1971, LOS: 4 ADMISSION DATE:  05/15/2024, CONSULTATION DATE: 05/16/2024 REFERRING MD: CANDIE Pore MD, CHIEF COMPLAINT: Cavitary lung lesion  History of Present Illness:   54 year old with alcohol  abuse, COPD, smoker presenting with hemoptysis, COPD exacerbation and worsening cavitary pneumonia. He has received 6 weeks of augmentin  and it seems he has been compliance. He has completed 4 weeks of antibiotics. Patient underwent to Transbronchial biopsy today, normal LN, no ebus sample were taken. Patient is hemodynamically stable. It seems patient would be discharge today.  He is currently smoking 1.5 ppd.    Pertinent  Medical History    has a past medical history of Depression, ETOH abuse, Hepatic steatosis (10/30/2021), Seizure (HCC), Subarachnoid hemorrhage (HCC) (07/28/2017), and Tobacco abuse (10/27/2021).   Significant Hospital Events: Including procedures, antibiotic start and stop dates in addition to other pertinent events   1/11 admit  Interim History / Subjective:   Breathing is improved, but he has some shortness of breath with exertion.  Objective    Blood pressure 111/72, pulse 79, temperature 98.4 F (36.9 C), temperature source Oral, resp. rate 20, height 5' 8 (1.727 m), weight 68.5 kg, SpO2 91%.        Intake/Output Summary (Last 24 hours) at 05/20/2024 1640 Last data filed at 05/20/2024 1452 Gross per 24 hour  Intake 1190.83 ml  Output --  Net 1190.83 ml   Filed Weights   05/16/24 0300 05/16/24 1519  Weight: 68 kg 68.5 kg    Examination: Alert, oriented  Heart rate regular rate and rhythm Lungs are clear with no wheeze Extremities with no edema  CT chest on admission with interval increase in size of spiculated right lower lobe mass with cavitation.  Prominent right hilar, mediastinal lymph node. 0.5 cm right middle lobe pulmonary nodule.  Resolved problem list   Assessment and Plan  Cavitary  pneumonia with hemoptysis S/p transbronchial biopsies.  Cavitary pneumonia with hemoptysis, initially diagnosed in October, with recent increase in cavity size and onset of hemoptysis over the past two days. Despite adherence to a 4-week course of Augmentin , the condition has worsened on CT scan. Differential diagnosis includes cancer, abscess, fungal infection, or atypical infection. He underwent to nav bronch with TBBX today. He has been on 5 days of zosyn , afebrile.  -Patient would be discharge today per primary team. -He already has schedule a follow up appt at the pulmonary clinic for results.  -Ok to finish the 2 weeks left of augmentin  -I will order a PET scan and brain MRI, next week, prior clinic appointment.  Chronic obstructive pulmonary disease  COPD exacerbation with ongoing wheezing. Currently receiving treatment with nebulizers and antibiotics. -He will need to be discharge with stiolto 2 puffs daily -Prednisone  x 5 days taper. -He is not on acute exacerbation during my encounter.  Tobacco use disorder Long-standing tobacco use disorder with current smoking of approximately one and a half packs per day. Smoking cessation is critical for improving respiratory health. - Advised smoking cessation.  Will make follow-up in pulmonary clinic in 2 weeks after procedure.  Signature:   Marny Patch MD [facility] See Amion for pager  After 7:00 pm call Elink  [phone] 05/20/2024, 4:40 PM         "

## 2024-05-21 DIAGNOSIS — J984 Other disorders of lung: Secondary | ICD-10-CM

## 2024-05-22 ENCOUNTER — Encounter (HOSPITAL_COMMUNITY): Payer: Self-pay | Admitting: Pulmonary Disease

## 2024-05-23 LAB — ACID FAST SMEAR (AFB, MYCOBACTERIA): Acid Fast Smear: NEGATIVE

## 2024-05-23 LAB — CULTURE, BAL-QUANTITATIVE W GRAM STAIN
Culture: NO GROWTH
Gram Stain: NONE SEEN

## 2024-05-24 LAB — FUNGUS CULTURE WITH STAIN

## 2024-05-24 LAB — CYTOLOGY - NON PAP

## 2024-05-24 LAB — FUNGUS CULTURE RESULT

## 2024-05-27 ENCOUNTER — Ambulatory Visit: Payer: Self-pay | Admitting: Pulmonary Disease

## 2024-05-27 DIAGNOSIS — B49 Unspecified mycosis: Secondary | ICD-10-CM

## 2024-06-01 ENCOUNTER — Ambulatory Visit (HOSPITAL_COMMUNITY)
Admission: RE | Admit: 2024-06-01 | Discharge: 2024-06-01 | Disposition: A | Discharge status: Home / Self Care | Payer: MEDICAID | Source: Ambulatory Visit

## 2024-06-01 DIAGNOSIS — J984 Other disorders of lung: Secondary | ICD-10-CM | POA: Insufficient documentation

## 2024-06-01 LAB — GLUCOSE, CAPILLARY: Glucose-Capillary: 74 mg/dL (ref 70–99)

## 2024-06-01 MED ORDER — FLUDEOXYGLUCOSE F - 18 (FDG) INJECTION
7.5000 | Freq: Once | INTRAVENOUS | Status: AC | PRN
  Administered 2024-06-01: 7.5 via INTRAVENOUS

## 2024-06-03 ENCOUNTER — Ambulatory Visit: Payer: MEDICAID | Admitting: Acute Care

## 2024-06-03 ENCOUNTER — Telehealth: Payer: Self-pay

## 2024-06-03 NOTE — Telephone Encounter (Signed)
 ATCx1 to get patient rescheduled for follow up on bronch,LMTCB

## 2024-06-09 ENCOUNTER — Ambulatory Visit: Payer: Self-pay

## 2024-06-09 NOTE — Telephone Encounter (Signed)
 I called patient to let him know about this PET scan and lung biopsy results, however he did not answer. I left a voicemail. I mentioned his biopsy results were negative for cancer, and it was positive for a fungal infection. I will place a referral to ID for evaluation as well to follow up with Sarah, Groce. His PET scan was avid for the lesion expected in the setting of infection.  Marny Patch, MD

## 2024-06-09 NOTE — Telephone Encounter (Signed)
 Please make an appt with Sarah Groce for lung biopsy f/u. Pt missed the last appt. Thanks!

## 2024-06-10 NOTE — Telephone Encounter (Signed)
 Attempted to call pt. Left voicemail for PT to call office tog et scheduled.
